# Patient Record
Sex: Female | Born: 1945 | Race: White | Hispanic: No | Smoking: Never smoker
Health system: Southern US, Community
[De-identification: ages and names within clinical notes are randomized; demographics above are authoritative.]

## PROBLEM LIST (undated history)

## (undated) DIAGNOSIS — G20 Parkinson's disease: Secondary | ICD-10-CM

## (undated) DIAGNOSIS — R251 Tremor, unspecified: Secondary | ICD-10-CM

## (undated) DIAGNOSIS — G20A1 Parkinson's disease without dyskinesia, without mention of fluctuations: Secondary | ICD-10-CM

## (undated) DIAGNOSIS — E785 Hyperlipidemia, unspecified: Secondary | ICD-10-CM

## (undated) DIAGNOSIS — M549 Dorsalgia, unspecified: Secondary | ICD-10-CM

## (undated) DIAGNOSIS — G4730 Sleep apnea, unspecified: Secondary | ICD-10-CM

## (undated) DIAGNOSIS — I499 Cardiac arrhythmia, unspecified: Secondary | ICD-10-CM

## (undated) DIAGNOSIS — G8929 Other chronic pain: Secondary | ICD-10-CM

## (undated) DIAGNOSIS — I10 Essential (primary) hypertension: Secondary | ICD-10-CM

## (undated) DIAGNOSIS — M1990 Unspecified osteoarthritis, unspecified site: Secondary | ICD-10-CM

## (undated) DIAGNOSIS — R55 Syncope and collapse: Secondary | ICD-10-CM

## (undated) DIAGNOSIS — I509 Heart failure, unspecified: Secondary | ICD-10-CM

## (undated) DIAGNOSIS — D649 Anemia, unspecified: Secondary | ICD-10-CM

## (undated) DIAGNOSIS — Z972 Presence of dental prosthetic device (complete) (partial): Secondary | ICD-10-CM

## (undated) DIAGNOSIS — M539 Dorsopathy, unspecified: Secondary | ICD-10-CM

## (undated) DIAGNOSIS — E119 Type 2 diabetes mellitus without complications: Secondary | ICD-10-CM

## (undated) DIAGNOSIS — K219 Gastro-esophageal reflux disease without esophagitis: Secondary | ICD-10-CM

## (undated) DIAGNOSIS — E039 Hypothyroidism, unspecified: Secondary | ICD-10-CM

## (undated) HISTORY — PX: COLONOSCOPY: SHX174

## (undated) HISTORY — PX: CARDIAC CATHETERIZATION: SHX172

## (undated) HISTORY — PX: OOPHORECTOMY: SHX86

## (undated) HISTORY — PX: ABDOMINAL HYSTERECTOMY: SHX81

## (undated) HISTORY — DX: Heart failure, unspecified: I50.9

---

## 2005-07-07 ENCOUNTER — Ambulatory Visit: Payer: Self-pay | Admitting: Internal Medicine

## 2005-07-21 ENCOUNTER — Ambulatory Visit: Payer: Self-pay | Admitting: Cardiology

## 2005-11-04 ENCOUNTER — Ambulatory Visit: Payer: Self-pay | Admitting: Gastroenterology

## 2006-07-26 ENCOUNTER — Ambulatory Visit: Payer: Self-pay | Admitting: Internal Medicine

## 2006-08-19 ENCOUNTER — Ambulatory Visit: Payer: Self-pay | Admitting: Internal Medicine

## 2006-09-08 ENCOUNTER — Ambulatory Visit: Payer: Self-pay | Admitting: Obstetrics and Gynecology

## 2006-09-30 ENCOUNTER — Other Ambulatory Visit: Payer: Self-pay

## 2006-11-07 ENCOUNTER — Ambulatory Visit: Payer: Self-pay | Admitting: Obstetrics and Gynecology

## 2006-12-19 ENCOUNTER — Ambulatory Visit: Payer: Self-pay | Admitting: Urology

## 2007-09-12 ENCOUNTER — Ambulatory Visit: Payer: Self-pay | Admitting: Internal Medicine

## 2008-10-01 ENCOUNTER — Ambulatory Visit: Payer: Self-pay | Admitting: Internal Medicine

## 2008-11-26 ENCOUNTER — Ambulatory Visit: Payer: Self-pay | Admitting: Gastroenterology

## 2008-12-31 ENCOUNTER — Ambulatory Visit: Payer: Self-pay | Admitting: Gastroenterology

## 2009-10-02 ENCOUNTER — Ambulatory Visit: Payer: Self-pay | Admitting: Internal Medicine

## 2010-10-20 ENCOUNTER — Ambulatory Visit: Payer: Self-pay | Admitting: Internal Medicine

## 2011-10-01 ENCOUNTER — Ambulatory Visit: Payer: Self-pay | Admitting: Internal Medicine

## 2011-10-27 ENCOUNTER — Ambulatory Visit: Payer: Self-pay | Admitting: Internal Medicine

## 2012-02-27 ENCOUNTER — Ambulatory Visit: Payer: Self-pay | Admitting: Neurology

## 2012-02-27 LAB — CREATININE, SERUM: EGFR (African American): >60

## 2012-10-27 ENCOUNTER — Ambulatory Visit: Payer: Self-pay | Admitting: Internal Medicine

## 2013-10-29 ENCOUNTER — Ambulatory Visit: Payer: Self-pay | Admitting: Internal Medicine

## 2014-03-03 ENCOUNTER — Emergency Department: Payer: Self-pay | Admitting: Emergency Medicine

## 2014-08-11 ENCOUNTER — Emergency Department: Payer: Self-pay | Admitting: Emergency Medicine

## 2014-11-18 ENCOUNTER — Ambulatory Visit: Payer: Self-pay | Admitting: Internal Medicine

## 2015-01-18 ENCOUNTER — Ambulatory Visit: Payer: Self-pay | Admitting: Orthopedic Surgery

## 2015-02-07 ENCOUNTER — Emergency Department
Admit: 2015-02-07 | Disposition: A | Discharge status: Home / Self Care | Payer: Self-pay | Admitting: Emergency Medicine

## 2015-02-21 ENCOUNTER — Other Ambulatory Visit: Payer: Self-pay | Admitting: Orthopedic Surgery

## 2015-02-21 DIAGNOSIS — M545 Low back pain: Secondary | ICD-10-CM

## 2015-03-08 ENCOUNTER — Ambulatory Visit
Admission: RE | Admit: 2015-03-08 | Discharge: 2015-03-08 | Disposition: A | Discharge status: Home / Self Care | Payer: Medicare Other | Source: Ambulatory Visit | Attending: Orthopedic Surgery | Admitting: Orthopedic Surgery

## 2015-03-08 DIAGNOSIS — M4806 Spinal stenosis, lumbar region: Secondary | ICD-10-CM | POA: Insufficient documentation

## 2015-03-08 DIAGNOSIS — M545 Low back pain: Secondary | ICD-10-CM

## 2015-03-28 ENCOUNTER — Emergency Department
Admission: EM | Admit: 2015-03-28 | Discharge: 2015-03-28 | Disposition: A | Discharge status: Home / Self Care | Payer: Medicare Other | Attending: Student | Admitting: Student

## 2015-03-28 DIAGNOSIS — E119 Type 2 diabetes mellitus without complications: Secondary | ICD-10-CM | POA: Diagnosis not present

## 2015-03-28 DIAGNOSIS — I10 Essential (primary) hypertension: Secondary | ICD-10-CM | POA: Diagnosis not present

## 2015-03-28 DIAGNOSIS — M4806 Spinal stenosis, lumbar region: Secondary | ICD-10-CM | POA: Diagnosis not present

## 2015-03-28 DIAGNOSIS — G8929 Other chronic pain: Secondary | ICD-10-CM | POA: Insufficient documentation

## 2015-03-28 DIAGNOSIS — M48061 Spinal stenosis, lumbar region without neurogenic claudication: Secondary | ICD-10-CM

## 2015-03-28 DIAGNOSIS — M549 Dorsalgia, unspecified: Secondary | ICD-10-CM | POA: Diagnosis present

## 2015-03-28 HISTORY — DX: Type 2 diabetes mellitus without complications: E11.9

## 2015-03-28 HISTORY — DX: Other chronic pain: G89.29

## 2015-03-28 HISTORY — DX: Hyperlipidemia, unspecified: E78.5

## 2015-03-28 HISTORY — DX: Dorsalgia, unspecified: M54.9

## 2015-03-28 HISTORY — DX: Essential (primary) hypertension: I10

## 2015-03-28 HISTORY — DX: Hypothyroidism, unspecified: E03.9

## 2015-03-28 MED ORDER — OXYCODONE-ACETAMINOPHEN 7.5-325 MG PO TABS: 1.0000 | ORAL_TABLET | ORAL | Status: DC | PRN

## 2015-03-28 NOTE — ED Provider Notes (Signed)
[facility] Emergency Department Provider Note  ____________________________________________  Time seen: Approximately 1:05 PM  I have reviewed the triage vital signs and the nursing notes.   HISTORY  Chief Complaint Back Pain    HPI Stephanie [name] is a 69 y.o. female patient finished a course of prednisone and tramadol 2 days ago for back pain state there has been no relief. Patient has been referred to the pain clinic for evaluation epidural injections. Patient had to change her appointment because she was on antibodies and a total of the reschedule when she finished. Patient states the pain is increasing and she has no medication to control at this time. Patient MRI was done on 03/18/2015 revealed a multiple levels of disc protrusion and spinal stenosis. Patient denies any bladder or bowel dysfunction. Patient states pain increase ambulation and flexion of the lumbar spine.   Past Medical History  Diagnosis Date  . Chronic back pain   . Hypertension   . Diabetes mellitus without complication   . Hypothyroid   . Hyperlipidemia     There are no active problems to display for this patient.   Past Surgical History  Procedure Laterality Date  . Abdominal hysterectomy      No current outpatient prescriptions on file.  Allergies Review of patient's allergies indicates no known allergies.  No family history on file.  Social History History  Substance Use Topics  . Smoking status: Never Smoker   . Smokeless tobacco: Not on file  . Alcohol Use: No    Review of Systems Constitutional: No fever/chills.  Eyes: No visual changes. ENT: No sore throat. Cardiovascular: Denies chest pain. Respiratory: Denies shortness of breath. Gastrointestinal: No abdominal pain.  No nausea, no vomiting.  No diarrhea.  No constipation. Genitourinary: Negative for dysuria. Negative for bowel dysfunction Musculoskeletal: Positive for back pain. Skin: Negative  for rash. Neurological: Negative for headaches, focal weakness or numbness.  Allergic/Immunilogical: **} 10-point ROS otherwise negative.  ____________________________________________   PHYSICAL EXAM:  VITAL SIGNS: ED Triage Vitals  Enc Vitals Group     BP 03/28/15 1202 124/99 mmHg     Pulse Rate 03/28/15 1202 85     Resp 03/28/15 1202 18     Temp 03/28/15 1202 98.2 F (36.8 C)     Temp Source 03/28/15 1202 Oral     SpO2 03/28/15 1202 100 %     Weight 03/28/15 1202 212 lb (96.163 kg)     Height 03/28/15 1202 5\' 1"  (1.549 m)     Head Cir --      Peak Flow --      Pain Score 03/28/15 1204 10     Pain Loc --      Pain Edu? --      Excl. in [place]? --     Constitutional: Alert and oriented. Appears in moderate distress. Eyes: Conjunctivae are normal. PERRL. EOMI. Head: Atraumatic. Nose: No congestion/rhinnorhea. Mouth/Throat: Mucous membranes are moist.  Oropharynx non-erythematous. Neck: No stridor.  No spinal deformity mild guarding with palpation of C4-C5. Hematological/Lymphatic/Immunilogical: No cervical lymphadenopathy. Cardiovascular: Normal rate, regular rhythm. Grossly normal heart sounds.  Good peripheral circulation. Respiratory: Normal respiratory effort.  No retractions. Lungs CTAB. Gastrointestinal: Soft and nontender. No distention. No abdominal bruits. No CVA tenderness. Musculoskeletal: No obvious spinal deformity. Patient is tender to palpation L3-L5. Patient sits to stand up heavy reliance on upper extremities. Decreased range of motion's in all fields. Neurologic:  Normal speech and language. No gross focal neurologic  deficits are appreciated. Speech is normal. Ambulates with a hesitant step. Skin:  Skin is warm, dry and intact. No rash noted. Psychiatric: Mood and affect are normal. Speech and behavior are normal.  ____________________________________________   LABS (all labs ordered are listed, but only abnormal results are displayed)  Labs Reviewed -  No data to display ____________________________________________  EKG   ____________________________________________  RADIOLOGY Reviewed MRI results consistent with patient complaint. ____________________________________________   PROCEDURES  Procedure(s) performed: None  Critical Care performed: No  ____________________________________________   INITIAL IMPRESSION / ASSESSMENT AND PLAN / ED COURSE  Pertinent labs & imaging results that were available during my care of the patient were reviewed by me and considered in my medical decision making (see chart for details).  Chronic back pain. ____________________________________________   FINAL CLINICAL IMPRESSION(S) / ED DIAGNOSES  Final diagnoses:  Spinal stenosis of lumbar region      Sable Feil, PA-C 03/28/15 [place] Gayle, MD 03/28/15 [id number]

## 2015-03-28 NOTE — Discharge Instructions (Signed)
Take medication as directed and re-schedule your epidural injection

## 2015-03-28 NOTE — ED Notes (Signed)
Pt reports hx of 2 herniated discs; orthopedist put her on predisone and tramadol without any relief. Pt here for pain medication.

## 2015-03-28 NOTE — ED Notes (Signed)
Pt c/o lower back pain who she sees Dr. Mack Guise for and is referred to the pain clinic for tx.

## 2015-07-03 HISTORY — PX: BACK SURGERY: SHX140

## 2015-08-25 ENCOUNTER — Ambulatory Visit: Payer: Medicare Other | Admitting: Pain Medicine

## 2015-11-13 ENCOUNTER — Encounter: Payer: Self-pay | Admitting: *Deleted

## 2015-11-24 ENCOUNTER — Encounter: Payer: Self-pay | Admitting: *Deleted

## 2015-11-24 ENCOUNTER — Ambulatory Visit: Payer: Medicare Other | Admitting: Certified Registered"

## 2015-11-24 ENCOUNTER — Ambulatory Visit
Admission: RE | Admit: 2015-11-24 | Discharge: 2015-11-24 | Disposition: A | Discharge status: Home / Self Care | Payer: Medicare Other | Source: Ambulatory Visit | Attending: Ophthalmology | Admitting: Ophthalmology

## 2015-11-24 ENCOUNTER — Encounter
Admission: RE | Disposition: A | Discharge status: Home / Self Care | Payer: Self-pay | Source: Ambulatory Visit | Attending: Ophthalmology

## 2015-11-24 DIAGNOSIS — G4730 Sleep apnea, unspecified: Secondary | ICD-10-CM | POA: Insufficient documentation

## 2015-11-24 DIAGNOSIS — I10 Essential (primary) hypertension: Secondary | ICD-10-CM | POA: Insufficient documentation

## 2015-11-24 DIAGNOSIS — E7800 Pure hypercholesterolemia, unspecified: Secondary | ICD-10-CM | POA: Insufficient documentation

## 2015-11-24 DIAGNOSIS — I499 Cardiac arrhythmia, unspecified: Secondary | ICD-10-CM | POA: Insufficient documentation

## 2015-11-24 DIAGNOSIS — H2511 Age-related nuclear cataract, right eye: Secondary | ICD-10-CM | POA: Insufficient documentation

## 2015-11-24 DIAGNOSIS — R002 Palpitations: Secondary | ICD-10-CM | POA: Diagnosis not present

## 2015-11-24 DIAGNOSIS — K219 Gastro-esophageal reflux disease without esophagitis: Secondary | ICD-10-CM | POA: Insufficient documentation

## 2015-11-24 DIAGNOSIS — R55 Syncope and collapse: Secondary | ICD-10-CM | POA: Diagnosis not present

## 2015-11-24 DIAGNOSIS — R251 Tremor, unspecified: Secondary | ICD-10-CM | POA: Insufficient documentation

## 2015-11-24 DIAGNOSIS — E119 Type 2 diabetes mellitus without complications: Secondary | ICD-10-CM | POA: Diagnosis not present

## 2015-11-24 DIAGNOSIS — M1990 Unspecified osteoarthritis, unspecified site: Secondary | ICD-10-CM | POA: Diagnosis not present

## 2015-11-24 DIAGNOSIS — Z90710 Acquired absence of both cervix and uterus: Secondary | ICD-10-CM | POA: Diagnosis not present

## 2015-11-24 DIAGNOSIS — M7989 Other specified soft tissue disorders: Secondary | ICD-10-CM | POA: Insufficient documentation

## 2015-11-24 HISTORY — DX: Syncope and collapse: R55

## 2015-11-24 HISTORY — DX: Cardiac arrhythmia, unspecified: I49.9

## 2015-11-24 HISTORY — DX: Tremor, unspecified: R25.1

## 2015-11-24 HISTORY — DX: Unspecified osteoarthritis, unspecified site: M19.90

## 2015-11-24 HISTORY — DX: Anemia, unspecified: D64.9

## 2015-11-24 HISTORY — PX: CATARACT EXTRACTION W/PHACO: SHX586

## 2015-11-24 HISTORY — DX: Sleep apnea, unspecified: G47.30

## 2015-11-24 HISTORY — DX: Gastro-esophageal reflux disease without esophagitis: K21.9

## 2015-11-24 LAB — GLUCOSE, CAPILLARY: GLUCOSE-CAPILLARY: 159 mg/dL — AB (ref 65–99)

## 2015-11-24 SURGERY — PHACOEMULSIFICATION, CATARACT, WITH IOL INSERTION
Anesthesia: Monitor Anesthesia Care | Site: Eye | Laterality: Right | Wound class: Clean

## 2015-11-24 MED ORDER — NA CHONDROIT SULF-NA HYALURON 40-17 MG/ML IO SOLN
INTRAOCULAR | Status: DC | PRN
  Administered 2015-11-24: 1 mL via INTRAOCULAR

## 2015-11-24 MED ORDER — TETRACAINE HCL 0.5 % OP SOLN
OPHTHALMIC | Status: DC | PRN
  Administered 2015-11-24: 1 [drp] via OPHTHALMIC

## 2015-11-24 MED ORDER — CYCLOPENTOLATE HCL 2 % OP SOLN
1.0000 [drp] | OPHTHALMIC | Status: AC | PRN
  Administered 2015-11-24 (×4): 1 [drp] via OPHTHALMIC

## 2015-11-24 MED ORDER — CEFUROXIME OPHTHALMIC INJECTION 1 MG/0.1 ML
INJECTION | OPHTHALMIC | Status: DC | PRN
  Administered 2015-11-24: .1 mL via INTRACAMERAL

## 2015-11-24 MED ORDER — MIDAZOLAM HCL 2 MG/2ML IJ SOLN
INTRAMUSCULAR | Status: DC | PRN
  Administered 2015-11-24: 1 mg via INTRAVENOUS

## 2015-11-24 MED ORDER — MOXIFLOXACIN HCL 0.5 % OP SOLN
1.0000 [drp] | OPHTHALMIC | Status: AC | PRN
  Administered 2015-11-24 (×3): 1 [drp] via OPHTHALMIC

## 2015-11-24 MED ORDER — ALFENTANIL 500 MCG/ML IJ INJ
INJECTION | INTRAMUSCULAR | Status: DC | PRN
  Administered 2015-11-24: 500 ug via INTRAVENOUS

## 2015-11-24 MED ORDER — CARBACHOL 0.01 % IO SOLN
INTRAOCULAR | Status: DC | PRN
  Administered 2015-11-24: .5 mL via INTRAOCULAR

## 2015-11-24 MED ORDER — SODIUM CHLORIDE 0.9 % IV SOLN
INTRAVENOUS | Status: DC
  Administered 2015-11-24: 07:00:00 via INTRAVENOUS

## 2015-11-24 MED ORDER — EPINEPHRINE HCL 1 MG/ML IJ SOLN
INTRAOCULAR | Status: DC | PRN
  Administered 2015-11-24: 1 mL via OPHTHALMIC

## 2015-11-24 MED ORDER — MOXIFLOXACIN HCL 0.5 % OP SOLN
OPHTHALMIC | Status: DC | PRN
  Administered 2015-11-24: 1 [drp] via OPHTHALMIC

## 2015-11-24 MED ORDER — LIDOCAINE HCL (PF) 4 % IJ SOLN
INTRAOCULAR | Status: DC | PRN
  Administered 2015-11-24: 08:00:00 via OPHTHALMIC

## 2015-11-24 MED ORDER — PHENYLEPHRINE HCL 10 % OP SOLN
1.0000 [drp] | OPHTHALMIC | Status: AC | PRN
  Administered 2015-11-24 (×4): 1 [drp] via OPHTHALMIC

## 2015-11-24 MED ORDER — LIDOCAINE HCL (PF) 4 % IJ SOLN
INTRAMUSCULAR | Status: DC | PRN
  Administered 2015-11-24: 4 mL via OPHTHALMIC

## 2015-11-24 SURGICAL SUPPLY — 29 items

## 2015-11-24 NOTE — Discharge Instructions (Addendum)
See handout. ° °AMBULATORY SURGERY  °DISCHARGE INSTRUCTIONS ° ° °1) The drugs that you were given will stay in your system until tomorrow so for the next 24 hours you should not: ° °A) Drive an automobile °B) Make any legal decisions °C) Drink any alcoholic beverage ° ° °2) You may resume regular meals tomorrow.  Today it is better to start with liquids and gradually work up to solid foods. ° °You may eat anything you prefer, but it is better to start with liquids, then soup and crackers, and gradually work up to solid foods. ° ° °3) Please notify your doctor immediately if you have any unusual bleeding, trouble breathing, redness and pain at the surgery site, drainage, fever, or pain not relieved by medication. ° ° ° °4) Additional Instructions: ° ° ° ° ° ° ° °Please contact your physician with any problems or Same Day Surgery at [phone], Monday through Friday 6 am to 4 pm, or [place] [facility] number at [phone]. °

## 2015-11-24 NOTE — Anesthesia Postprocedure Evaluation (Signed)
Anesthesia Post Note  Patient: Stephanie [name]  Procedure(s) Performed: Procedure(s) (LRB): CATARACT EXTRACTION PHACO AND INTRAOCULAR LENS PLACEMENT (IOC) (Right)  Patient location during evaluation: Short Stay Anesthesia Type: MAC Level of consciousness: awake Pain management: pain level controlled Vital Signs Assessment: post-procedure vital signs reviewed and stable Respiratory status: spontaneous breathing Cardiovascular status: blood pressure returned to baseline Postop Assessment: no headache Anesthetic complications: no    Last Vitals:  Filed Vitals:   11/24/15 0612 11/24/15 0804  BP: 123/81 135/67  Pulse: 77 70  Temp: 36.5 C 36.6 C  Resp: 16 14    Last Pain:  Filed Vitals:   11/24/15 0804  PainSc: 5                  Brantley Fling

## 2015-11-24 NOTE — Interval H&P Note (Signed)
History and Physical Interval Note:  11/24/2015 7:20 AM  Stephanie [name]  has presented today for surgery, with the diagnosis of CATARACT  The various methods of treatment have been discussed with the patient and family. After consideration of risks, benefits and other options for treatment, the patient has consented to  Procedure(s): CATARACT EXTRACTION PHACO AND INTRAOCULAR LENS PLACEMENT ([place]) (Right) as a surgical intervention .  The patient's history has been reviewed, patient examined, no change in status, stable for surgery.  I have reviewed the patient's chart and labs.  Questions were answered to the patient's satisfaction.     Oseas Detty

## 2015-11-24 NOTE — Transfer of Care (Signed)
Immediate Anesthesia Transfer of Care Note  Patient: Stephanie [name]  Procedure(s) Performed: Procedure(s) with comments: CATARACT EXTRACTION PHACO AND INTRAOCULAR LENS PLACEMENT (IOC) (Right) - [place] 01:14 AP% 25.5 CDE 30.69 fluid pack lot # [id number]  Patient Location: Short Stay  Anesthesia Type:MAC  Level of Consciousness: awake  Airway & Oxygen Therapy: Patient Spontanous Breathing  Post-op Assessment: Report given to RN  Post vital signs: Reviewed  Last Vitals:  Filed Vitals:   11/24/15 0612 11/24/15 0804  BP: 123/81 135/67  Pulse: 77 70  Temp: 36.5 C 36.6 C  Resp: 16 14    Complications: No apparent anesthesia complications

## 2015-11-24 NOTE — Op Note (Signed)
Date of Surgery: 11/24/2015 Date of Dictation: 11/24/2015 8:02 AM Pre-operative Diagnosis:  Nuclear Sclerotic Cataract right Eye Post-operative Diagnosis: same Procedure performed: Extra-capsular Cataract Extraction (ECCE) with placement of a posterior chamber intraocular lens (IOL) right Eye IOL:  Implant Name Type Inv. Item Serial No. Manufacturer Lot No. LRB No. Used  LENS IOL ACRYSOF IQ 22.0 - [id number] Intraocular Lens LENS IOL ACRYSOF IQ 22.0 [id number] ALCON [id number] Right 1   Anesthesia: 2% Lidocaine and 4% Marcaine in a 50/50 mixture with 10 unites/ml of Hylenex given as a peribulbar Anesthesiologist: Anesthesiologist: Elyse Hsu, MD CRNA: Rolla Plate, CRNA Complications: none Estimated Blood Loss: less than 1 ml  Description of procedure:  The patient was given anesthesia and sedation via intravenous access. The patient was then prepped and draped in the usual fashion. A 25-gauge needle was bent for initiating the capsulorhexis. A 5-0 silk suture was placed through the conjunctiva superior and inferiorly to serve as bridle sutures. Hemostasis was obtained at the superior limbus using an eraser cautery. A partial thickness groove was made at the anterior surgical limbus with a 64 Beaver blade and this was dissected anteriorly with an [employer]. The anterior chamber was entered at 10 o'clock with a 1.0 mm paracentesis knife and through the lamellar dissection with a 2.6 mm Alcon keratome. Epi-Shugarcaine 0.5 CC [9 cc BSS Plus (Alcon), 3 cc 4% preservative-free lidocaine (Hospira) and 4 cc 1:1000 preservative-free, bisulfite-free epinephrine] was injected into the anterior chamber via the paracentesis tract. Epi-Shugarcaine 0.5 CC [9 cc BSS Plus (Alcon), 3 cc 4% preservative-free lidocaine (Hospira) and 4 cc 1:1000 preservative-free, bisulfite-free epinephrine] was injected into the anterior chamber via the paracentesis tract. DiscoVisc was injected to replace the aqueous and  a continuous tear curvilinear capsulorhexis was performed using a bent 25-gauge needle.  Balance salt on a syringe was used to perform hydro-dissection and phacoemulsification was carried out using a divide and conquer technique. Procedure(s) with comments: CATARACT EXTRACTION PHACO AND INTRAOCULAR LENS PLACEMENT (IOC) (Right) - [place] 01:14 AP% 25.5 CDE 30.69 fluid pack lot # [id number]. Irrigation/aspiration was used to remove the residual cortex and the capsular bag was inflated with DiscoVisc. The intraocular lens was inserted into the capsular bag using a pre-loaded UltraSert Delivery System. Irrigation/aspiration was used to remove the residual DiscoVisc. The wound was inflated with balanced salt and checked for leaks. None were found. Miostat was injected via the paracentesis track and 0.1 ml of cefuroxime containing 1 mg of drug  was injected via the paracentesis track. The wound was checked for leaks again and none were found.   The bridal sutures were removed and two drops of Vigamox were placed on the eye. An eye shield was placed to protect the eye and the patient was discharged to the recovery area in good condition.   Sharone Almond MD

## 2015-11-24 NOTE — Anesthesia Preprocedure Evaluation (Signed)
Anesthesia Evaluation  Patient identified by MRN, date of birth, ID band Patient awake    Reviewed: Allergy & Precautions, NPO status , Patient's Chart, lab work & pertinent test results  Airway Mallampati: III  TM Distance: >3 FB Neck ROM: Limited    Dental  (+) Upper Dentures, Lower Dentures   Pulmonary sleep apnea and Continuous Positive Airway Pressure Ventilation ,  Not using CPAP.   Pulmonary exam normal        Cardiovascular Exercise Tolerance: Poor hypertension, Pt. on medications Normal cardiovascular exam     Neuro/Psych    GI/Hepatic GERD  Medicated and Controlled,  Endo/Other  diabetes, Type 2BG 159.  Renal/GU      Musculoskeletal   Abdominal (+) + obese,   Peds  Hematology   Anesthesia Other Findings   Reproductive/Obstetrics                             Anesthesia Physical Anesthesia Plan  ASA: III  Anesthesia Plan: MAC   Post-op Pain Management:    Induction: Intravenous  Airway Management Planned: Nasal Cannula  Additional Equipment:   Intra-op Plan:   Post-operative Plan:   Informed Consent: I have reviewed the patients History and Physical, chart, labs and discussed the procedure including the risks, benefits and alternatives for the proposed anesthesia with the patient or authorized representative who has indicated his/her understanding and acceptance.     Plan Discussed with: CRNA  Anesthesia Plan Comments:         Anesthesia Quick Evaluation

## 2015-11-24 NOTE — H&P (Signed)
See scanned note.

## 2015-12-15 ENCOUNTER — Other Ambulatory Visit: Payer: Self-pay | Admitting: Orthopedic Surgery

## 2015-12-15 DIAGNOSIS — M5412 Radiculopathy, cervical region: Secondary | ICD-10-CM

## 2015-12-31 ENCOUNTER — Emergency Department: Payer: Medicare Other

## 2015-12-31 ENCOUNTER — Encounter: Payer: Self-pay | Admitting: Emergency Medicine

## 2015-12-31 ENCOUNTER — Inpatient Hospital Stay
Admission: EM | Admit: 2015-12-31 | Discharge: 2016-01-02 | DRG: 641 | Disposition: A | Discharge status: Home / Self Care | Payer: Medicare Other | Attending: Internal Medicine | Admitting: Internal Medicine

## 2015-12-31 DIAGNOSIS — Z7984 Long term (current) use of oral hypoglycemic drugs: Secondary | ICD-10-CM

## 2015-12-31 DIAGNOSIS — E876 Hypokalemia: Secondary | ICD-10-CM | POA: Diagnosis present

## 2015-12-31 DIAGNOSIS — M1990 Unspecified osteoarthritis, unspecified site: Secondary | ICD-10-CM | POA: Diagnosis present

## 2015-12-31 DIAGNOSIS — E119 Type 2 diabetes mellitus without complications: Secondary | ICD-10-CM | POA: Diagnosis present

## 2015-12-31 DIAGNOSIS — K219 Gastro-esophageal reflux disease without esophagitis: Secondary | ICD-10-CM | POA: Diagnosis present

## 2015-12-31 DIAGNOSIS — E039 Hypothyroidism, unspecified: Secondary | ICD-10-CM | POA: Diagnosis present

## 2015-12-31 DIAGNOSIS — T500X5A Adverse effect of mineralocorticoids and their antagonists, initial encounter: Secondary | ICD-10-CM | POA: Diagnosis present

## 2015-12-31 DIAGNOSIS — R0602 Shortness of breath: Secondary | ICD-10-CM | POA: Diagnosis present

## 2015-12-31 DIAGNOSIS — E875 Hyperkalemia: Secondary | ICD-10-CM | POA: Diagnosis present

## 2015-12-31 DIAGNOSIS — M545 Low back pain: Secondary | ICD-10-CM | POA: Diagnosis present

## 2015-12-31 DIAGNOSIS — I10 Essential (primary) hypertension: Secondary | ICD-10-CM | POA: Diagnosis present

## 2015-12-31 DIAGNOSIS — G20 Parkinson's disease: Secondary | ICD-10-CM | POA: Diagnosis present

## 2015-12-31 DIAGNOSIS — Z79891 Long term (current) use of opiate analgesic: Secondary | ICD-10-CM

## 2015-12-31 DIAGNOSIS — G8929 Other chronic pain: Secondary | ICD-10-CM | POA: Diagnosis present

## 2015-12-31 DIAGNOSIS — E871 Hypo-osmolality and hyponatremia: Secondary | ICD-10-CM | POA: Diagnosis not present

## 2015-12-31 DIAGNOSIS — E785 Hyperlipidemia, unspecified: Secondary | ICD-10-CM | POA: Diagnosis present

## 2015-12-31 DIAGNOSIS — Z794 Long term (current) use of insulin: Secondary | ICD-10-CM

## 2015-12-31 DIAGNOSIS — G4733 Obstructive sleep apnea (adult) (pediatric): Secondary | ICD-10-CM | POA: Diagnosis present

## 2015-12-31 DIAGNOSIS — Z79899 Other long term (current) drug therapy: Secondary | ICD-10-CM

## 2015-12-31 LAB — BASIC METABOLIC PANEL
ANION GAP: 9 (ref 5–15)
BUN: 18 mg/dL (ref 6–20)
CALCIUM: 9.2 mg/dL (ref 8.9–10.3)
CO2: 21 mmol/L — ABNORMAL LOW (ref 22–32)
Chloride: 95 mmol/L — ABNORMAL LOW (ref 101–111)
Creatinine, Ser: 0.86 mg/dL (ref 0.44–1.00)
GFR calc Af Amer: >60 mL/min (ref >60)
GLUCOSE: 125 mg/dL — AB (ref 65–99)
Potassium: 5.6 mmol/L — ABNORMAL HIGH (ref 3.5–5.1)
Sodium: 125 mmol/L — ABNORMAL LOW (ref 135–145)

## 2015-12-31 LAB — CBC
HCT: 38.9 % (ref 35.0–47.0)
HEMOGLOBIN: 13.5 g/dL (ref 12.0–16.0)
MCH: 29.7 pg (ref 26.0–34.0)
MCHC: 34.6 g/dL (ref 32.0–36.0)
MCV: 85.6 fL (ref 80.0–100.0)
PLATELETS: 262 10*3/uL (ref 150–440)
RBC: 4.54 MIL/uL (ref 3.80–5.20)
RDW: 14.7 % — AB (ref 11.5–14.5)
WBC: 7.9 10*3/uL (ref 3.6–11.0)

## 2015-12-31 LAB — GLUCOSE, CAPILLARY
GLUCOSE-CAPILLARY: 119 mg/dL — AB (ref 65–99)
Glucose-Capillary: 97 mg/dL (ref 65–99)

## 2015-12-31 LAB — TSH: TSH: 2.342 u[IU]/mL (ref 0.350–4.500)

## 2015-12-31 LAB — TROPONIN I

## 2015-12-31 LAB — BRAIN NATRIURETIC PEPTIDE: B NATRIURETIC PEPTIDE 5: 15 pg/mL (ref 0.0–100.0)

## 2015-12-31 MED ORDER — ONDANSETRON HCL 4 MG/2ML IJ SOLN: 4.0000 mg | Freq: Four times a day (QID) | INTRAMUSCULAR | Status: DC | PRN

## 2015-12-31 MED ORDER — ADULT MULTIVITAMIN W/MINERALS CH
1.0000 | ORAL_TABLET | Freq: Every day | ORAL | Status: DC
  Administered 2016-01-01 – 2016-01-02 (×2): 1 via ORAL
  Filled 2015-12-31 (×2): qty 1

## 2015-12-31 MED ORDER — SODIUM CHLORIDE 0.9 % IV SOLN
INTRAVENOUS | Status: DC
  Administered 2015-12-31 – 2016-01-01 (×2): via INTRAVENOUS

## 2015-12-31 MED ORDER — ACETAMINOPHEN 325 MG PO TABS
650.0000 mg | ORAL_TABLET | Freq: Four times a day (QID) | ORAL | Status: DC | PRN
Start: 2015-12-31 — End: 2016-01-02
  Administered 2016-01-01 – 2016-01-02 (×2): 650 mg via ORAL
  Filled 2015-12-31 (×2): qty 2

## 2015-12-31 MED ORDER — MELOXICAM 7.5 MG PO TABS
15.0000 mg | ORAL_TABLET | Freq: Every day | ORAL | Status: DC
  Administered 2016-01-01 – 2016-01-02 (×2): 15 mg via ORAL
  Filled 2015-12-31 (×2): qty 2

## 2015-12-31 MED ORDER — METFORMIN HCL 500 MG PO TABS
1000.0000 mg | ORAL_TABLET | Freq: Two times a day (BID) | ORAL | Status: DC
  Administered 2016-01-01 – 2016-01-02 (×3): 1000 mg via ORAL
  Filled 2015-12-31 (×3): qty 2

## 2015-12-31 MED ORDER — GLIMEPIRIDE 4 MG PO TABS: 4.0000 mg | ORAL_TABLET | Freq: Two times a day (BID) | ORAL | Status: DC

## 2015-12-31 MED ORDER — ENALAPRIL MALEATE 5 MG PO TABS
20.0000 mg | ORAL_TABLET | Freq: Two times a day (BID) | ORAL | Status: DC
  Administered 2015-12-31 – 2016-01-02 (×4): 20 mg via ORAL
  Filled 2015-12-31 (×4): qty 4

## 2015-12-31 MED ORDER — IOHEXOL 300 MG/ML  SOLN
75.0000 mL | Freq: Once | INTRAMUSCULAR | Status: AC | PRN
  Administered 2015-12-31: 75 mL via INTRAVENOUS

## 2015-12-31 MED ORDER — GLIMEPIRIDE 2 MG PO TABS
2.0000 mg | ORAL_TABLET | Freq: Every day | ORAL | Status: DC
  Administered 2016-01-01: 2 mg via ORAL
  Filled 2015-12-31 (×3): qty 1

## 2015-12-31 MED ORDER — ONDANSETRON HCL 4 MG PO TABS: 4.0000 mg | ORAL_TABLET | Freq: Four times a day (QID) | ORAL | Status: DC | PRN

## 2015-12-31 MED ORDER — ENOXAPARIN SODIUM 40 MG/0.4ML ~~LOC~~ SOLN
40.0000 mg | SUBCUTANEOUS | Status: DC
  Administered 2015-12-31 – 2016-01-01 (×2): 40 mg via SUBCUTANEOUS
  Filled 2015-12-31 (×2): qty 0.4

## 2015-12-31 MED ORDER — FLUTICASONE PROPIONATE 50 MCG/ACT NA SUSP
1.0000 | NASAL | Status: DC | PRN
  Administered 2016-01-01: 1 via NASAL
  Filled 2015-12-31: qty 16

## 2015-12-31 MED ORDER — GLIMEPIRIDE 2 MG PO TABS
4.0000 mg | ORAL_TABLET | Freq: Every day | ORAL | Status: DC
  Administered 2016-01-01 – 2016-01-02 (×2): 4 mg via ORAL
  Filled 2015-12-31 (×2): qty 1
  Filled 2015-12-31 (×2): qty 2

## 2015-12-31 MED ORDER — PRAVASTATIN SODIUM 20 MG PO TABS
40.0000 mg | ORAL_TABLET | Freq: Every day | ORAL | Status: DC
  Administered 2016-01-01 – 2016-01-02 (×2): 40 mg via ORAL
  Filled 2015-12-31 (×2): qty 2

## 2015-12-31 MED ORDER — CYCLOBENZAPRINE HCL 10 MG PO TABS
5.0000 mg | ORAL_TABLET | Freq: Once | ORAL | Status: AC
  Administered 2015-12-31: 5 mg via ORAL
  Filled 2015-12-31: qty 1

## 2015-12-31 MED ORDER — SPIRONOLACTONE 25 MG PO TABS: 50.0000 mg | ORAL_TABLET | Freq: Two times a day (BID) | ORAL | Status: DC

## 2015-12-31 MED ORDER — LEVOTHYROXINE SODIUM 50 MCG PO TABS
25.0000 ug | ORAL_TABLET | Freq: Every day | ORAL | Status: DC
  Administered 2016-01-01 – 2016-01-02 (×2): 25 ug via ORAL
  Filled 2015-12-31 (×2): qty 1

## 2015-12-31 MED ORDER — ACETAMINOPHEN 650 MG RE SUPP: 650.0000 mg | Freq: Four times a day (QID) | RECTAL | Status: DC | PRN

## 2015-12-31 MED ORDER — INSULIN ASPART 100 UNIT/ML ~~LOC~~ SOLN
0.0000 [IU] | Freq: Three times a day (TID) | SUBCUTANEOUS | Status: DC
  Administered 2016-01-01: 3 [IU] via SUBCUTANEOUS
  Administered 2016-01-01 – 2016-01-02 (×2): 1 [IU] via SUBCUTANEOUS
  Filled 2015-12-31 (×2): qty 1
  Filled 2015-12-31: qty 3

## 2015-12-31 MED ORDER — SODIUM POLYSTYRENE SULFONATE 15 GM/60ML PO SUSP
30.0000 g | Freq: Once | ORAL | Status: AC
  Administered 2015-12-31: 30 g via ORAL
  Filled 2015-12-31: qty 120

## 2015-12-31 NOTE — ED Notes (Signed)
Says she has incresing sob for 2 weeks.  Says she is unable to sleep and has to sit up all night.

## 2015-12-31 NOTE — ED Provider Notes (Signed)
[facility] Emergency Department Provider Note  ____________________________________________  Time seen: Approximately 11:28 AM  I have reviewed the triage vital signs and the nursing notes.   HISTORY  Chief Complaint Fatigue and Shortness of Breath    HPI Stephanie [name] is a 70 y.o. female who has a history of diabetes and hypertension. She reports she is scheduled for an MRI of her head and possibly neck tomorrow because of the ongoing evaluation is getting for Parkinson's disease patient reports she's been having progressive shortness of breath for 2 weeks. It gets worse when she walks and gets worse when she lays down she says she feels like her airway and she can't breathe if she lays down the kink is happening in her neck. Patient has a history of low blood sodium as well. She eats all but her sodium stays down. She does not have a fever she is not coughing she's not having any increased size of her belly belly pain edema in her legs or any other problems. Patient reports she cannot lay down for very long without more than 10 or 15 minutes before she gets short of breath and has to sit up again. Patient complains of some weakness and also a little bit of nausea.   Past Medical History  Diagnosis Date  . Chronic back pain   . Hypertension   . Diabetes mellitus without complication ([place])   . Hypothyroid   . Hyperlipidemia   . Sleep apnea   . Dysrhythmia   . Syncope   . Tremors of nervous system   . GERD (gastroesophageal reflux disease)   . Anemia   . Arthritis     Patient Active Problem List   Diagnosis Date Noted  . Hyponatremia 12/31/2015    Past Surgical History  Procedure Laterality Date  . Abdominal hysterectomy    . Cardiac catheterization    . Oophorectomy    . Colonoscopy    . Back surgery  09/16  . Cataract extraction w/phaco Right 11/24/2015    Procedure: CATARACT EXTRACTION PHACO AND INTRAOCULAR LENS PLACEMENT (IOC);  Surgeon:  Estill Cotta, MD;  Location: [facility] ORS;  Service: Ophthalmology;  Laterality: Right;  [place] 01:14 AP% 25.5 CDE 30.69 fluid pack lot # [id number]    Current Outpatient Rx  Name  Route  Sig  Dispense  Refill  . enalapril (VASOTEC) 20 MG tablet   Oral   Take 20 mg by mouth 2 (two) times daily.          . fluticasone (FLONASE) 50 MCG/ACT nasal spray   Nasal   Place 1-2 sprays into the nose as needed.         Marland Kitchen glimepiride (AMARYL) 4 MG tablet   Oral   Take 4 mg by mouth 2 (two) times daily. Pt. Takes 1 tablet in the morning and .5 tablet at night.         . levothyroxine (SYNTHROID, LEVOTHROID) 25 MCG tablet   Oral   Take 25 mcg by mouth daily before breakfast.         . meloxicam (MOBIC) 15 MG tablet   Oral   Take 15 mg by mouth daily.         . metFORMIN (GLUCOPHAGE) 1000 MG tablet   Oral   Take 1,000 mg by mouth 2 (two) times daily with a meal.          . Multiple Vitamin (MULTIVITAMIN WITH MINERALS) TABS tablet   Oral  Take 1 tablet by mouth daily.         . pravastatin (PRAVACHOL) 40 MG tablet   Oral   Take 40 mg by mouth daily.         Marland Kitchen spironolactone (ALDACTONE) 50 MG tablet   Oral   Take 50 mg by mouth 2 (two) times daily.         . cyclobenzaprine (FLEXERIL) 10 MG tablet   Oral   Take 10 mg by mouth 2 (two) times daily.         Marland Kitchen oxyCODONE (OXY IR/ROXICODONE) 5 MG immediate release tablet   Oral   Take 5 mg by mouth 2 (two) times daily.           Allergies Review of patient's allergies indicates no known allergies.  No family history on file.  Social History Social History  Substance Use Topics  . Smoking status: Never Smoker   . Smokeless tobacco: None  . Alcohol Use: Yes    Review of Systems Constitutional: No fever/chills Eyes: No visual changes. ENT: No sore throat. Cardiovascular: Denies chest pain. Respiratory: See history of present illness Gastrointestinal: No abdominal pain.  No nausea, no vomiting.  No  diarrhea.  No constipation. Genitourinary: Negative for dysuria. Musculoskeletal: Negative for back pain. Skin: Negative for rash. Neurological: Negative for headaches, focal weakness or numbness.  10-point ROS otherwise negative.  ____________________________________________   PHYSICAL EXAM:  VITAL SIGNS: ED Triage Vitals  Enc Vitals Group     BP 12/31/15 0915 128/86 mmHg     Pulse Rate 12/31/15 0915 84     Resp --      Temp 12/31/15 0915 97.7 F (36.5 C)     Temp Source 12/31/15 0915 Oral     SpO2 12/31/15 0915 96 %     Weight 12/31/15 0915 204 lb (92.534 kg)     Height 12/31/15 0915 5\' 1"  (1.549 m)     Head Cir --      Peak Flow --      Pain Score 12/31/15 1128 7     Pain Loc --      Pain Edu? --      Excl. in [place]? --    Constitutional: Alert and oriented. Well appearing and in no acute distress. Eyes: Conjunctivae are normal. PERRL. EOMI. Head: Atraumatic. Nose: No congestion/rhinnorhea. Mouth/Throat: Mucous membranes are moist.  Oropharynx non-erythematous. Neck: No stridor. No masses  Cardiovascular: Normal rate, regular rhythm. Grossly normal heart sounds.  Good peripheral circulation. Respiratory: Normal respiratory effort.  No retractions. Lungs CTAB. Gastrointestinal: Soft and nontender. No distention. No abdominal bruits. No CVA tenderness. Musculoskeletal: No lower extremity tenderness nor edema.  No joint effusions. Neurologic:  Normal speech and language. No gross focal neurologic deficits are appreciated. No gait instability. Skin:  Skin is warm, dry and intact. No rash noted. Psychiatric: Mood and affect are normal. Speech and behavior are normal.  ____________________________________________   LABS (all labs ordered are listed, but only abnormal results are displayed)  Labs Reviewed  BASIC METABOLIC PANEL - Abnormal; Notable for the following:    Sodium 125 (*)    Potassium 5.6 (*)    Chloride 95 (*)    CO2 21 (*)    Glucose, Bld 125 (*)     All other components within normal limits  CBC - Abnormal; Notable for the following:    RDW 14.7 (*)    All other components within normal limits  TROPONIN I  BRAIN NATRIURETIC  PEPTIDE   ____________________________________________  EKG  KG read and interpreted by me shows normal sinus rhythm at a rate of 84 left axis no acute changes there are some irregularities in one or 2 of the leads especially aVL which make me wonder about the possibility of a flutter but I cannot see this in V1 which is the usually that rhythm is ____________________________________________  RADIOLOGY  Chest x-ray shows no acute disease per radiology CT of the neck shows no acute pathology there is some chronic gas in his esophagus which could mean the patient's having reflux symptoms per radiology. CT of the chest is no acute pathology per radiology ____________________________________________   Hamilton Capri  ____________________________________________   INITIAL IMPRESSION / ASSESSMENT AND PLAN / ED COURSE  Pertinent labs & imaging results that were available during my care of the patient were reviewed by me and considered in my medical decision making (see chart for details).  Repeat EKG because monitor looked unusual with wide-complex tachycardia repeat EKG shows sinus tach at 108 left axis no acute changes no V. tach ____________________________________________   FINAL CLINICAL IMPRESSION(S) / ED DIAGNOSES  Final diagnoses:  Hyponatremia  Hyperkalemia      Nena Polio, MD 12/31/15 1553

## 2015-12-31 NOTE — ED Notes (Signed)
Patient transported to CT 

## 2015-12-31 NOTE — H&P (Addendum)
[place] [facility] [place] NAME: Stephanie [name]    MR#:  [id number]  DATE OF BIRTH:  Nov 28, 1945  DATE OF ADMISSION:  12/31/2015  PRIMARY CARE PHYSICIAN: SPARKS,JEFFREY D, MD   REQUESTING/REFERRING PHYSICIAN: Dr Cinda Quest  CHIEF COMPLAINT:  Shortness of breath, not feeling well, the doctor said my sodium is low  HISTORY OF PRESENT ILLNESS:  Stephanie [name]  is a 70 y.o. female with a known history of tremors, hypertension, diabetes, hypothyroidism, hyperlipidemia and history of hyponatremia in the past comes to the emergency room with complaints of shortness of breath, waking up several times in the middle of the night and who has history of sleep apnea missed her sleep study appointment yesterday. Patient was evaluated for her shortness of breath in the form of CT of the chest and chest x-ray which was essentially negative for any PE or CHF. She was also found to have sodium of 125 and hospitalists were called for admission. Patient has had history OF hyponatremia in the past according to the labs from Dr. Doy Hutching. Patient states she was taken off her Parkinson's medicine because it was giving her low sodium not sure of what medicine she was on.  PAST MEDICAL HISTORY:   Past Medical History  Diagnosis Date  . Chronic back pain   . Hypertension   . Diabetes mellitus without complication ([place])   . Hypothyroid   . Hyperlipidemia   . Sleep apnea   . Dysrhythmia   . Syncope   . Tremors of nervous system   . GERD (gastroesophageal reflux disease)   . Anemia   . Arthritis     PAST SURGICAL HISTOIRY:   Past Surgical History  Procedure Laterality Date  . Abdominal hysterectomy    . Cardiac catheterization    . Oophorectomy    . Colonoscopy    . Back surgery  09/16  . Cataract extraction w/phaco Right 11/24/2015    Procedure: CATARACT EXTRACTION PHACO AND INTRAOCULAR LENS PLACEMENT (IOC);  Surgeon: Estill Cotta, MD;  Location:  [facility] ORS;  Service: Ophthalmology;  Laterality: Right;  [place] 01:14 AP% 25.5 CDE 30.69 fluid pack lot # [id number]    SOCIAL HISTORY:   Social History  Substance Use Topics  . Smoking status: Never Smoker   . Smokeless tobacco: Not on file  . Alcohol Use: Yes    FAMILY HISTORY:  No family history on file.  DRUG ALLERGIES:  No Known Allergies  REVIEW OF SYSTEMS:  Review of Systems  Constitutional: Negative for fever, chills and weight loss.  HENT: Negative for ear discharge, ear pain and nosebleeds.   Eyes: Negative for blurred vision, pain and discharge.  Respiratory: Negative for sputum production, shortness of breath, wheezing and stridor.   Cardiovascular: Negative for chest pain, palpitations, orthopnea and PND.  Gastrointestinal: Negative for nausea, vomiting, abdominal pain and diarrhea.  Genitourinary: Negative for urgency and frequency.  Musculoskeletal: Negative for back pain and joint pain.  Neurological: Positive for weakness. Negative for sensory change, speech change and focal weakness.  Psychiatric/Behavioral: Negative for depression and hallucinations. The patient is not nervous/anxious.   All other systems reviewed and are negative.    MEDICATIONS AT HOME:   Prior to Admission medications   Medication Sig Start Date End Date Taking? Authorizing Provider  enalapril (VASOTEC) 20 MG tablet Take 20 mg by mouth 2 (two) times daily.    Yes Historical Provider, MD  fluticasone (FLONASE) 50 MCG/ACT nasal spray Place 1-2 sprays  into the nose as needed. 09/29/15  Yes Historical Provider, MD  glimepiride (AMARYL) 4 MG tablet Take 4 mg by mouth 2 (two) times daily. Pt. Takes 1 tablet in the morning and .5 tablet at night.   Yes Historical Provider, MD  levothyroxine (SYNTHROID, LEVOTHROID) 25 MCG tablet Take 25 mcg by mouth daily before breakfast.   Yes Historical Provider, MD  meloxicam (MOBIC) 15 MG tablet Take 15 mg by mouth daily.   Yes Historical Provider, MD   metFORMIN (GLUCOPHAGE) 1000 MG tablet Take 1,000 mg by mouth 2 (two) times daily with a meal.    Yes Historical Provider, MD  Multiple Vitamin (MULTIVITAMIN WITH MINERALS) TABS tablet Take 1 tablet by mouth daily.   Yes Historical Provider, MD  pravastatin (PRAVACHOL) 40 MG tablet Take 40 mg by mouth daily.   Yes Historical Provider, MD  spironolactone (ALDACTONE) 50 MG tablet Take 50 mg by mouth 2 (two) times daily.   Yes Historical Provider, MD  cyclobenzaprine (FLEXERIL) 10 MG tablet Take 10 mg by mouth 2 (two) times daily.    Historical Provider, MD  oxyCODONE (OXY IR/ROXICODONE) 5 MG immediate release tablet Take 5 mg by mouth 2 (two) times daily.    Historical Provider, MD      VITAL SIGNS:  Blood pressure 125/83, pulse 117, temperature 97.7 F (36.5 C), temperature source Oral, resp. rate 15, height 5\' 1"  (1.549 m), weight 92.534 kg (204 lb), SpO2 96 %.  PHYSICAL EXAMINATION:  GENERAL:  70 y.o.-year-old patient lying in the bed with no acute distress.  EYES: Pupils equal, round, reactive to light and accommodation. No scleral icterus. Extraocular muscles intact.  HEENT: Head atraumatic, normocephalic. Oropharynx and nasopharynx clear.  NECK:  Supple, no jugular venous distention. No thyroid enlargement, no tenderness.  LUNGS: Normal breath sounds bilaterally, no wheezing, rales,rhonchi or crepitation. No use of accessory muscles of respiration.  CARDIOVASCULAR: S1, S2 normal. No murmurs, rubs, or gallops.  ABDOMEN: Soft, nontender, nondistended. Bowel sounds present. No organomegaly or mass.  EXTREMITIES: No pedal edema, cyanosis, or clubbing.  NEUROLOGIC: Cranial nerves II through XII are intact. Muscle strength 5/5 in all extremities. Sensation intact. Gait not checked.  PSYCHIATRIC: The patient is alert and oriented x 3.  SKIN: No obvious rash, lesion, or ulcer.   LABORATORY PANEL:   CBC  Recent Labs Lab 12/31/15 0928  WBC 7.9  HGB 13.5  HCT 38.9  PLT 262    ------------------------------------------------------------------------------------------------------------------  Chemistries   Recent Labs Lab 12/31/15 0928  NA 125*  K 5.6*  CL 95*  CO2 21*  GLUCOSE 125*  BUN 18  CREATININE 0.86  CALCIUM 9.2   ------------------------------------------------------------------------------------------------------------------  Cardiac Enzymes  Recent Labs Lab 12/31/15 0928  TROPONINI <0.03   ------------------------------------------------------------------------------------------------------------------  RADIOLOGY:  Dg Chest 2 View  12/31/2015  CLINICAL DATA:  Increasing shortness of breath for 2 weeks. Weakness. EXAM: CHEST  2 VIEW COMPARISON:  None. FINDINGS: The heart size and mediastinal contours are within normal limits. Minimal scarring or atelectasis in the lingula. Lungs are otherwise clear. No effusions. No osseous abnormality. IMPRESSION: No significant abnormality. Electronically Signed   By: Lorriane Shire M.D.   On: 12/31/2015 09:46   Ct Soft Tissue Neck W Contrast  12/31/2015  CLINICAL DATA:  Shortness of breath when lying flat were looking down. EXAM: CT NECK WITH CONTRAST TECHNIQUE: Multidetector CT imaging of the neck was performed using the standard protocol following the bolus administration of intravenous contrast. CONTRAST:  72mL OMNIPAQUE IOHEXOL 300 MG/ML  SOLN COMPARISON:  MRI of the cervical spine 01/18/2015. FINDINGS: Pharynx and larynx: No focal mucosal or submucosal lesions are present. The tongue base is within normal limits. Vocal cords are midline and symmetric. The airway is patent throughout the neck. Note is made that the esophagus is gas-filled in the upper mediastinum. This may reflect esophageal motility or reflux disease. Salivary glands: Within normal limits bilaterally. Thyroid: Negative. Lymph nodes: No significant cervical adenopathy is present. A left subclavian node measures 12 x 8 mm in appears flame  otorrhea. There is some patient motion in neck region. Vascular: Medial deviation of the right internal carotid artery creates slight bulging on the posterior hypopharynx without airway obstruction. Mild atherosclerotic calcifications are present without significant stenosis. Limited intracranial: Within normal limits Visualized orbits: The visualized portions are unremarkable. Mastoids and visualized paranasal sinuses: Clear Skeleton: Degenerative changes are present in the upper cervical spine. No focal lytic or blastic lesions are present. The patient is edentulous. Upper chest: Mild ground-glass attenuation is present. No focal airspace disease, nodule, or mass lesion is present. IMPRESSION: 1. The airway is patent without evidence for obstruction. 2. Chronic gas within the upper esophagus. This suggests esophageal motility disorder or possibly reflux disease. This could be giving the patient and abnormal sensation when she lies supine. 3. Minimal ground-glass attenuation at the lung apices likely reflects atelectasis as there was no significant disease present on the chest CT done on the same day. 4. Degenerative changes of the upper cervical spine. Electronically Signed   By: San Morelle M.D.   On: 12/31/2015 13:24   Ct Chest W Contrast  12/31/2015  CLINICAL DATA:  Shortness of breath when lying flat or looking down for 2 weeks, feels like something in her neck is cut upper airway, history hypertension, diabetes mellitus EXAM: CT CHEST WITH CONTRAST TECHNIQUE: Multidetector CT imaging of the chest was performed during intravenous contrast administration. Sagittal and coronal MPR images reconstructed from axial data set. CONTRAST:  52mL OMNIPAQUE IOHEXOL 300 MG/ML  SOLN IV COMPARISON:  None ; correlation chest radiograph 12/31/2015 FINDINGS: Minimal atherosclerotic calcification aorta and coronary arteries. Few scattered normal sized mediastinal nodes without thoracic adenopathy. Visualized portion  of upper abdomen normal appearance. Airways patent and unremarkable. Lungs clear. No pulmonary infiltrate, pleural effusion, pneumothorax or definite mass/nodule. Scattered degenerative disc disease changes of the thoracic spine with osseous demineralization. IMPRESSION: No significant intra thoracic abnormalities as above. Electronically Signed   By: Lavonia Dana M.D.   On: 12/31/2015 13:04    EKG:    IMPRESSION AND PLAN:   Mariangely Sawinski  is a 70 y.o. female with a known history of tremors, hypertension, diabetes, hypothyroidism, hyperlipidemia and history of hyponatremia in the past comes to the emergency room with complaints of shortness of breath, waking up several times in the middle of the night and who has history of sleep apnea missed her sleep study appointment yesterday. Patient was evaluated for her shortness of breath in the form of CT of the chest and chest x-ray which was essentially negative for any PE or CHF  1. Hyponatremia acute on chronic Start patient on IV normal saline and monitor sodium closely. Nephrology consultation for hyponatremia. Patient has had the symptoms in the past  2. Shortness of breath, waking up in the middle of the night with history of sleep apnea. Patient's sats in the emergency room were 98 200% on room air on activity and while talking. Patient was recommended to reschedule the sleep study appointment  as outpatient when she gets a chance to her PCP   3. Hypertension continue home meds  4. Hypokalemia suspected due to increased doses of spironolactone. Patient is on 50 mg twice a day not sure why. We'll hold off on her left arm. She has no EKG changes and sulfa give her some Kayexalate to bring potassium down.  5. Hyperlipidemia continue pravastatin  6. Type 2 diabetes continue Amaryl and sliding scale  7. DVT prophylaxis Lovenox  All the records are reviewed and case discussed with ED provider. Management plans discussed with the patient,  family and they are in agreement.  CODE STATUS: full  TOTAL TIME TAKING CARE OF THIS PATIENT: 50 minutes.    Dennisse Swader M.D on 12/31/2015 at 3:40 PM  Between 7am to 6pm - Pager - [phone]  After 6pm go to [email or web address] - password [email or web address] [facility]  Office  [phone]  CC: Primary care physician; [name], MD

## 2016-01-01 ENCOUNTER — Ambulatory Visit: Admission: RE | Admit: 2016-01-01 | Payer: Medicare Other | Source: Ambulatory Visit

## 2016-01-01 LAB — BASIC METABOLIC PANEL
Anion gap: 10 (ref 5–15)
BUN: 18 mg/dL (ref 6–20)
CHLORIDE: 97 mmol/L — AB (ref 101–111)
CO2: 21 mmol/L — AB (ref 22–32)
CREATININE: 0.93 mg/dL (ref 0.44–1.00)
Calcium: 8.5 mg/dL — ABNORMAL LOW (ref 8.9–10.3)
GFR calc non Af Amer: >60 mL/min (ref >60)
Glucose, Bld: 129 mg/dL — ABNORMAL HIGH (ref 65–99)
POTASSIUM: 4.3 mmol/L (ref 3.5–5.1)
Sodium: 128 mmol/L — ABNORMAL LOW (ref 135–145)

## 2016-01-01 LAB — GLUCOSE, CAPILLARY
GLUCOSE-CAPILLARY: 147 mg/dL — AB (ref 65–99)
GLUCOSE-CAPILLARY: 119 mg/dL — AB (ref 65–99)
GLUCOSE-CAPILLARY: 71 mg/dL (ref 65–99)
Glucose-Capillary: 242 mg/dL — ABNORMAL HIGH (ref 65–99)
Glucose-Capillary: 122 mg/dL — ABNORMAL HIGH (ref 65–99)
Glucose-Capillary: 129 mg/dL — ABNORMAL HIGH (ref 65–99)

## 2016-01-01 LAB — SODIUM
SODIUM: 128 mmol/L — AB (ref 135–145)
Sodium: 128 mmol/L — ABNORMAL LOW (ref 135–145)

## 2016-01-01 MED ORDER — CYCLOBENZAPRINE HCL 10 MG PO TABS
5.0000 mg | ORAL_TABLET | Freq: Once | ORAL | Status: AC
  Administered 2016-01-01: 5 mg via ORAL
  Filled 2016-01-01: qty 1

## 2016-01-01 MED ORDER — OXYCODONE HCL 5 MG PO TABS
5.0000 mg | ORAL_TABLET | Freq: Two times a day (BID) | ORAL | Status: DC | PRN
  Administered 2016-01-01 – 2016-01-02 (×3): 5 mg via ORAL
  Filled 2016-01-01 (×3): qty 1

## 2016-01-01 NOTE — Progress Notes (Addendum)
[place] at [place] NAME: Stephanie [name]    MR#:  [id number]  DATE OF BIRTH:  11-Jan-1946  SUBJECTIVE:  CHIEF COMPLAINT:  Patient is resting comfortably, shortness of breath is completely resolved as per her report but reporting back pain and asking pain medications. Reports that she has been following up with Dr. Mack Guise for several months and had back surgery on 07/03/2015, supposed to get outpatient MRI (doesn't know what MRI) today as a follow-up  REVIEW OF SYSTEMS:  CONSTITUTIONAL: No fever, fatigue or weakness.  EYES: No blurred or double vision.  EARS, NOSE, AND THROAT: No tinnitus or ear pain.  RESPIRATORY: No cough, shortness of breath, wheezing or hemoptysis.  CARDIOVASCULAR: No chest pain, orthopnea, edema.  GASTROINTESTINAL: No nausea, vomiting, diarrhea or abdominal pain.  GENITOURINARY: No dysuria, hematuria.  ENDOCRINE: No polyuria, nocturia,  HEMATOLOGY: No anemia, easy bruising or bleeding SKIN: No rash or lesion. MUSCULOSKELETAL: Reporting chronic low back pain and hip pain on the left side NEUROLOGIC: No tingling, numbness, weakness.  PSYCHIATRY: No anxiety or depression.   DRUG ALLERGIES:  No Known Allergies  VITALS:  Blood pressure 148/70, pulse 92, temperature 98 F (36.7 C), temperature source Oral, resp. rate 20, height 5\' 1"  (1.549 m), weight 91.173 kg (201 lb), SpO2 100 %.  PHYSICAL EXAMINATION:  GENERAL:  70 y.o.-year-old patient lying in the bed with no acute distress.  EYES: Pupils equal, round, reactive to light and accommodation. No scleral icterus. Extraocular muscles intact.  HEENT: Head atraumatic, normocephalic. Oropharynx and nasopharynx clear.  NECK:  Supple, no jugular venous distention. No thyroid enlargement, no tenderness.  LUNGS: Normal breath sounds bilaterally, no wheezing, rales,rhonchi or crepitation. No use of accessory muscles of respiration.  CARDIOVASCULAR: S1, S2 normal. No  murmurs, rubs, or gallops.  ABDOMEN: Soft, nontender, nondistended. Bowel sounds present. No organomegaly or mass.  EXTREMITIES: Range of motion of the left hip is limited from tenderness  Peripheral pulses are 2+ No pedal edema, cyanosis, or clubbing.  NEUROLOGIC: Cranial nerves II through XII are intact. Muscle strength 5/5 in all extremities. Sensation intact. Gait not checked.  PSYCHIATRIC: The patient is alert and oriented x 3.  SKIN: No obvious rash, lesion, or ulcer.    LABORATORY PANEL:   CBC  Recent Labs Lab 12/31/15 0928  WBC 7.9  HGB 13.5  HCT 38.9  PLT 262   ------------------------------------------------------------------------------------------------------------------  Chemistries   Recent Labs Lab 01/01/16 0515 01/01/16 1212  NA 128* 128*  K 4.3  --   CL 97*  --   CO2 21*  --   GLUCOSE 129*  --   BUN 18  --   CREATININE 0.93  --   CALCIUM 8.5*  --    ------------------------------------------------------------------------------------------------------------------  Cardiac Enzymes  Recent Labs Lab 12/31/15 0928  TROPONINI <0.03   ------------------------------------------------------------------------------------------------------------------  RADIOLOGY:  Dg Chest 2 View  12/31/2015  CLINICAL DATA:  Increasing shortness of breath for 2 weeks. Weakness. EXAM: CHEST  2 VIEW COMPARISON:  None. FINDINGS: The heart size and mediastinal contours are within normal limits. Minimal scarring or atelectasis in the lingula. Lungs are otherwise clear. No effusions. No osseous abnormality. IMPRESSION: No significant abnormality. Electronically Signed   By: Lorriane Shire M.D.   On: 12/31/2015 09:46   Ct Soft Tissue Neck W Contrast  12/31/2015  CLINICAL DATA:  Shortness of breath when lying flat were looking down. EXAM: CT NECK WITH CONTRAST TECHNIQUE: Multidetector CT imaging of the neck was  performed using the standard protocol following the bolus administration  of intravenous contrast. CONTRAST:  33mL OMNIPAQUE IOHEXOL 300 MG/ML  SOLN COMPARISON:  MRI of the cervical spine 01/18/2015. FINDINGS: Pharynx and larynx: No focal mucosal or submucosal lesions are present. The tongue base is within normal limits. Vocal cords are midline and symmetric. The airway is patent throughout the neck. Note is made that the esophagus is gas-filled in the upper mediastinum. This may reflect esophageal motility or reflux disease. Salivary glands: Within normal limits bilaterally. Thyroid: Negative. Lymph nodes: No significant cervical adenopathy is present. A left subclavian node measures 12 x 8 mm in appears flame otorrhea. There is some patient motion in neck region. Vascular: Medial deviation of the right internal carotid artery creates slight bulging on the posterior hypopharynx without airway obstruction. Mild atherosclerotic calcifications are present without significant stenosis. Limited intracranial: Within normal limits Visualized orbits: The visualized portions are unremarkable. Mastoids and visualized paranasal sinuses: Clear Skeleton: Degenerative changes are present in the upper cervical spine. No focal lytic or blastic lesions are present. The patient is edentulous. Upper chest: Mild ground-glass attenuation is present. No focal airspace disease, nodule, or mass lesion is present. IMPRESSION: 1. The airway is patent without evidence for obstruction. 2. Chronic gas within the upper esophagus. This suggests esophageal motility disorder or possibly reflux disease. This could be giving the patient and abnormal sensation when she lies supine. 3. Minimal ground-glass attenuation at the lung apices likely reflects atelectasis as there was no significant disease present on the chest CT done on the same day. 4. Degenerative changes of the upper cervical spine. Electronically Signed   By: San Morelle M.D.   On: 12/31/2015 13:24   Ct Chest W Contrast  12/31/2015  CLINICAL DATA:   Shortness of breath when lying flat or looking down for 2 weeks, feels like something in her neck is cut upper airway, history hypertension, diabetes mellitus EXAM: CT CHEST WITH CONTRAST TECHNIQUE: Multidetector CT imaging of the chest was performed during intravenous contrast administration. Sagittal and coronal MPR images reconstructed from axial data set. CONTRAST:  34mL OMNIPAQUE IOHEXOL 300 MG/ML  SOLN IV COMPARISON:  None ; correlation chest radiograph 12/31/2015 FINDINGS: Minimal atherosclerotic calcification aorta and coronary arteries. Few scattered normal sized mediastinal nodes without thoracic adenopathy. Visualized portion of upper abdomen normal appearance. Airways patent and unremarkable. Lungs clear. No pulmonary infiltrate, pleural effusion, pneumothorax or definite mass/nodule. Scattered degenerative disc disease changes of the thoracic spine with osseous demineralization. IMPRESSION: No significant intra thoracic abnormalities as above. Electronically Signed   By: Lavonia Dana M.D.   On: 12/31/2015 13:04    EKG:   Orders placed or performed during the hospital encounter of 12/31/15  . EKG 12-Lead  . EKG 12-Lead  . ED EKG within 10 minutes  . ED EKG within 10 minutes  . EKG 12-Lead  . EKG 12-Lead    ASSESSMENT AND PLAN:   Stephanie [name] is a 70 y.o. female with a known history of tremors, chronic low back pain and back surgery by Dr. Mack Guise in September -2016 hypertension, diabetes, hypothyroidism, hyperlipidemia and history of hyponatremia in the past comes to the emergency room with complaints of shortness of breath, waking up several times in the middle of the night and who has history of sleep apnea missed her sleep study appointment yesterday. Patient was evaluated for her shortness of breath in the form of CT of the chest and chest x-ray which was essentially negative for any PE  or CHF  #. Hyponatremia acute on chronic Sodium is improving with IV fluids-125-128  on  IV normal saline and monitor sodium closely. Nephrology consultation for hyponatremia. Discussed with Dr. Candiss Norse , patient is mentating fine at this time Patient has had the symptoms in the past  #. Shortness of breath, waking up in the middle of the night with history of sleep apnea. Completely resolved as per patient's report  Patient's sats in the emergency room were 98 200% on room air on activity and while talking. Patient was recommended to reschedule the sleep study appointment as outpatient when she gets a chance  #Chronic low back pain Pain management as needed Orthopedics consult is placed to Dr. Mack Guise as the patient is requesting MRI to be done in the hospital setting which is originally scheduled as outpatient MRI, she doesn't know what part of the spine need to be imaged CT of soft tissue neck with chronic changes   # Hypertension continue home meds  #. Hyperkalemia suspected due to increased doses of spironolactone. Improving, Kayexalate was given Potassium 5.6-4.3  Patient is on 50 mg twice a day not sure why. We'll hold off.  She has no EKG changes .  #. Hyperlipidemia continue pravastatin  #. Type 2 diabetes continue Amaryl and sliding scale  #. DVT prophylaxis Lovenox   Patient's daughter Ms. Butch Penny called and asked for the plan of care. He explained to her that her sodium is improving and patient is clinically doing fine with IV fluids and shortness of breath is resolved. Asked to get the MRI of the spine while patient is in the hospital, as this is a chronic problem Ive recommended to reschedule MRI to get done as an outpatient, also told her that patient will be most likely be discharged in a day or 2 as clinically improving. She asked me to call Dr. Harden Mo office and reschedule the MRI. I've told her that our secretary will try to help her to reschedule the MRI appointment. I was about to offer to consult orthopedics Dr. Mack Guise, but she became extremely  rude on me and said that I'm incompetent to take care of her mom. She also asked me to transfer the patient out of this hospital. At this point of time as I and patient's family could not get along well, I will sign off on this case. Patient was handed over to Dr. Genia Harold, who has agreed to take care of this patient  Orthopedics consult is placed and discussed with Dr. Mack Guise regarding the consult, he has recommended outpatient follow-up at this point of time.  Dr. Candiss Norse, nephrology and RN Barry Dienes were  present in the patient's room during my discussion with the patient's daughter on speaker phone  Nursing director Ms.Loney Hering called and asked me about the MRI if it can be ordered as an inpatient. Explained to her about the patient's chronic back pain  and recommended outpatient study;   All the records are reviewed and case discussed with Care Management/Social Workerr. Management plans discussed with the patient, family and they are in agreement.  CODE STATUS: fc   TOTAL TIME TAKING CARE OF THIS PATIENT: 45  minutes.   POSSIBLE D/C IN ? DAYS, DEPENDING ON CLINICAL CONDITION.   Nicholes Mango M.D on 01/01/2016 at 3:05 PM  Between 7am to 6pm - Pager - [phone] After 6pm go to [email or web address] - password [email or web address] [facility]  Office  [phone]  CC: Primary care physician; [name], MD

## 2016-01-01 NOTE — Progress Notes (Signed)
Pt alert and oreinted. Pt daughter Stephanie [name] called with concern on pt status and previously scheduled 01/01/16 outpatient MRI. Upon addressing concerns, relayed message to Dr. Mauro Kaufmann who agreed to speaking to pt daughter. Upon asking, Dr. Sandre Kitty shared that pt would likely be discharged tomorrow being that all goes as planned and labs improved.To avoid incurring in-patient cost vs out-patient MRI, Dr. Mauro Kaufmann suggested that pt MRI be re-scheduled out-patient and offered nurse station number for the outpatient MRI to discuss scheduling for possible follow-up. Pt daughter became very upset that the pt was under our care, missed her outpatient MRI appointment, and would have to re-schedule the appointment. Dr. Mauro Kaufmann also offered pt consult with orthopedic MD in addition to possibly getting put patient MRI re-scheduled. Pt daughter stated she understood the 'politics of it' yet still questioned what is being done for her now. Pt daughter became very upset on phone and questioned the competency of Dr. Mauro Kaufmann.   The pt Stephanie [name] who also heard the conversation between MD and pt Daughter, on speaker phone, apologized for her daughter's manner in discussion. Stating 'sometimes she is head strong'.   New order for ortho consult was placed by Dr. Mauro Kaufmann and prn pain medication was ordered by Dr. Lemmie Evens. Singh.  PRN pain medication was given, pt resting in bed continue to assess.

## 2016-01-01 NOTE — Consult Note (Signed)
Date: 01/01/2016                  Patient Name:  Stephanie [name]  MRN: [id number]  DOB: 10-23-1946  Age / Sex: 70 y.o., female         PCP: [name], MD                 Service Requesting Consult: Internal Medicine                 Reason for Consult: Hyponatremia            History of Present Illness: Patient is a 70 y.o. female with medical problems of chronic DM-2, OSA, Syncope, GERD, Parkinson's, chronic back pain who was admitted to [facility] on 12/31/2015 for evaluation of hyponatremia.  Patient reports she has been dealing with hyponatremia for the past 3 years Admission sodium was 125 Today's sodium level is 128 Repeat sodium is 128 also Serum creatinine is within normal range Other workup included TSH which is normal at 2.342   Medications: Outpatient medications: Prescriptions prior to admission  Medication Sig Dispense Refill Last Dose  . enalapril (VASOTEC) 20 MG tablet Take 20 mg by mouth 2 (two) times daily.    12/31/2015 at Unknown time  . fluticasone (FLONASE) 50 MCG/ACT nasal spray Place 1-2 sprays into the nose as needed.   prn  . glimepiride (AMARYL) 4 MG tablet Take 4 mg by mouth 2 (two) times daily. Pt. Takes 1 tablet in the morning and .5 tablet at night.   12/31/2015 at Unknown time  . levothyroxine (SYNTHROID, LEVOTHROID) 25 MCG tablet Take 25 mcg by mouth daily before breakfast.   12/31/2015 at Unknown time  . meloxicam (MOBIC) 15 MG tablet Take 15 mg by mouth daily.   12/31/2015 at Unknown time  . metFORMIN (GLUCOPHAGE) 1000 MG tablet Take 1,000 mg by mouth 2 (two) times daily with a meal.    12/31/2015 at Unknown time  . Multiple Vitamin (MULTIVITAMIN WITH MINERALS) TABS tablet Take 1 tablet by mouth daily.   12/30/2015 at Unknown time  . pravastatin (PRAVACHOL) 40 MG tablet Take 40 mg by mouth daily.   12/31/2015 at Unknown time  . spironolactone (ALDACTONE) 50 MG tablet Take 50 mg by mouth 2 (two) times daily.   12/31/2015 at Unknown time  . cyclobenzaprine  (FLEXERIL) 10 MG tablet Take 10 mg by mouth 2 (two) times daily.   11/23/2015 at 1900  . oxyCODONE (OXY IR/ROXICODONE) 5 MG immediate release tablet Take 5 mg by mouth 2 (two) times daily.   11/23/2015 at 2100    Current medications: Current Facility-Administered Medications  Medication Dose Route Frequency Provider Last Rate Last Dose  . 0.9 %  sodium chloride infusion   Intravenous Continuous Fritzi Mandes, MD 75 mL/hr at 01/01/16 0941    . acetaminophen (TYLENOL) tablet 650 mg  650 mg Oral Q6H PRN Fritzi Mandes, MD   650 mg at 01/01/16 0940   Or  . acetaminophen (TYLENOL) suppository 650 mg  650 mg Rectal Q6H PRN Fritzi Mandes, MD      . enalapril (VASOTEC) tablet 20 mg  20 mg Oral BID Fritzi Mandes, MD   20 mg at 01/01/16 [id number]  . enoxaparin (LOVENOX) injection 40 mg  40 mg Subcutaneous Q24H Fritzi Mandes, MD   40 mg at 12/31/15 2117  . fluticasone (FLONASE) 50 MCG/ACT nasal spray 1 spray  1 spray Each Nare PRN Fritzi Mandes, MD   1 spray  at 01/01/16 0941  . glimepiride (AMARYL) tablet 2 mg  2 mg Oral Q supper Lenis Noon, RPH   2 mg at 12/31/15 1730  . glimepiride (AMARYL) tablet 4 mg  4 mg Oral Q breakfast Lenis Noon, RPH   4 mg at 01/01/16 [id number]  . insulin aspart (novoLOG) injection 0-9 Units  0-9 Units Subcutaneous TID WC Fritzi Mandes, MD   3 Units at 01/01/16 0936  . levothyroxine (SYNTHROID, LEVOTHROID) tablet 25 mcg  25 mcg Oral QAC breakfast Fritzi Mandes, MD   25 mcg at 01/01/16 [phone]  . meloxicam (MOBIC) tablet 15 mg  15 mg Oral Daily Fritzi Mandes, MD   15 mg at 01/01/16 0940  . metFORMIN (GLUCOPHAGE) tablet 1,000 mg  1,000 mg Oral BID WC Fritzi Mandes, MD   1,000 mg at 01/01/16 [id number]  . multivitamin with minerals tablet 1 tablet  1 tablet Oral Daily Fritzi Mandes, MD   1 tablet at 01/01/16 0940  . ondansetron (ZOFRAN) tablet 4 mg  4 mg Oral Q6H PRN Fritzi Mandes, MD       Or  . ondansetron (ZOFRAN) injection 4 mg  4 mg Intravenous Q6H PRN Fritzi Mandes, MD      . oxyCODONE (Oxy IR/ROXICODONE) immediate release  tablet 5 mg  5 mg Oral BID PRN Murlean Iba, MD   5 mg at 01/01/16 1227  . pravastatin (PRAVACHOL) tablet 40 mg  40 mg Oral Daily Fritzi Mandes, MD   40 mg at 01/01/16 0940      Allergies: No Known Allergies    Past Medical History: Past Medical History  Diagnosis Date  . Chronic back pain   . Hypertension   . Diabetes mellitus without complication ([place])   . Hypothyroid   . Hyperlipidemia   . Sleep apnea   . Dysrhythmia   . Syncope   . Tremors of nervous system   . GERD (gastroesophageal reflux disease)   . Anemia   . Arthritis      Past Surgical History: Past Surgical History  Procedure Laterality Date  . Abdominal hysterectomy    . Cardiac catheterization    . Oophorectomy    . Colonoscopy    . Back surgery  09/16  . Cataract extraction w/phaco Right 11/24/2015    Procedure: CATARACT EXTRACTION PHACO AND INTRAOCULAR LENS PLACEMENT (IOC);  Surgeon: Estill Cotta, MD;  Location: [facility] ORS;  Service: Ophthalmology;  Laterality: Right;  [place] 01:14 AP% 25.5 CDE 30.69 fluid pack lot # [id number]     Family History: No family history on file.   Social History: Social History   Social History  . Marital Status: Divorced    Spouse Name: N/A  . Number of Children: N/A  . Years of Education: N/A   Occupational History  . Not on file.   Social History Main Topics  . Smoking status: Never Smoker   . Smokeless tobacco: Not on file  . Alcohol Use: Yes  . Drug Use: No  . Sexual Activity: Not Currently   Other Topics Concern  . Not on file   Social History Narrative     Review of Systems: Gen: No fevers or chills HEENT: No complaints CV: No lower extremity edema, no chest pain Resp: No breathing problems, earlier she had experienced shortness of breath prior to admission GI: Appetite is fair, no nausea or vomiting GU : No problems reported MS: Severe chronic back pain due to multiple disc problems Derm:  No acute problems reported Psych:  No problems  reported Heme: No problems reported Neuro: No complaints Endocrine no complaints  Vital Signs: Blood pressure 148/70, pulse 92, temperature 98 F (36.7 C), temperature source Oral, resp. rate 20, height 5\' 1"  (1.549 m), weight 91.173 kg (201 lb), SpO2 100 %.   Intake/Output Summary (Last 24 hours) at 01/01/16 1640 Last data filed at 01/01/16 1000  Gross per 24 hour  Intake 1164.72 ml  Output      0 ml  Net 1164.72 ml    Weight trends: Filed Weights   12/31/15 0915 12/31/15 1700  Weight: 92.534 kg (204 lb) 91.173 kg (201 lb)    Physical Exam: General:  laying in the bed, looks uncomfortable   HEENT  anicteric, moist oral mucous membranes   Neck:  supple   Lungs:  normal respiratory effort, clear to auscultation   Heart::  regular rhythm, no rub or gallop   Abdomen:  soft, nontender   Extremities:  no peripheral edema   Neurologic:  alert, oriented   Skin:  no acute rashes              Lab results: Basic Metabolic Panel:  Recent Labs Lab 12/31/15 0928 01/01/16 0515 01/01/16 1212  NA 125* 128* 128*  K 5.6* 4.3  --   CL 95* 97*  --   CO2 21* 21*  --   GLUCOSE 125* 129*  --   BUN 18 18  --   CREATININE 0.86 0.93  --   CALCIUM 9.2 8.5*  --     Liver Function Tests: No results for input(s): AST, ALT, ALKPHOS, BILITOT, PROT, ALBUMIN in the last 168 hours. No results for input(s): LIPASE, AMYLASE in the last 168 hours. No results for input(s): AMMONIA in the last 168 hours.  CBC:  Recent Labs Lab 12/31/15 0928  WBC 7.9  HGB 13.5  HCT 38.9  MCV 85.6  PLT 262    Cardiac Enzymes:  Recent Labs Lab 12/31/15 0928  TROPONINI <0.03    BNP: Invalid input(s): POCBNP  CBG:  Recent Labs Lab 12/31/15 1719 12/31/15 2108 01/01/16 0723 01/01/16 0913 01/01/16 1131  GLUCAP 97 119* 147* 242* 119*    Microbiology: No results found for this or any previous visit (from the past 720 hour(s)).   Coagulation Studies: No results for input(s): LABPROT,  INR in the last 72 hours.  Urinalysis: No results for input(s): COLORURINE, LABSPEC, PHURINE, GLUCOSEU, HGBUR, BILIRUBINUR, KETONESUR, PROTEINUR, UROBILINOGEN, NITRITE, LEUKOCYTESUR in the last 72 hours.  Invalid input(s): APPERANCEUR      Imaging: Dg Chest 2 View  12/31/2015  CLINICAL DATA:  Increasing shortness of breath for 2 weeks. Weakness. EXAM: CHEST  2 VIEW COMPARISON:  None. FINDINGS: The heart size and mediastinal contours are within normal limits. Minimal scarring or atelectasis in the lingula. Lungs are otherwise clear. No effusions. No osseous abnormality. IMPRESSION: No significant abnormality. Electronically Signed   By: Lorriane Shire M.D.   On: 12/31/2015 09:46   Ct Soft Tissue Neck W Contrast  12/31/2015  CLINICAL DATA:  Shortness of breath when lying flat were looking down. EXAM: CT NECK WITH CONTRAST TECHNIQUE: Multidetector CT imaging of the neck was performed using the standard protocol following the bolus administration of intravenous contrast. CONTRAST:  27mL OMNIPAQUE IOHEXOL 300 MG/ML  SOLN COMPARISON:  MRI of the cervical spine 01/18/2015. FINDINGS: Pharynx and larynx: No focal mucosal or submucosal lesions are present. The tongue base is within normal limits. Vocal cords are midline and symmetric. The airway  is patent throughout the neck. Note is made that the esophagus is gas-filled in the upper mediastinum. This may reflect esophageal motility or reflux disease. Salivary glands: Within normal limits bilaterally. Thyroid: Negative. Lymph nodes: No significant cervical adenopathy is present. A left subclavian node measures 12 x 8 mm in appears flame otorrhea. There is some patient motion in neck region. Vascular: Medial deviation of the right internal carotid artery creates slight bulging on the posterior hypopharynx without airway obstruction. Mild atherosclerotic calcifications are present without significant stenosis. Limited intracranial: Within normal limits Visualized  orbits: The visualized portions are unremarkable. Mastoids and visualized paranasal sinuses: Clear Skeleton: Degenerative changes are present in the upper cervical spine. No focal lytic or blastic lesions are present. The patient is edentulous. Upper chest: Mild ground-glass attenuation is present. No focal airspace disease, nodule, or mass lesion is present. IMPRESSION: 1. The airway is patent without evidence for obstruction. 2. Chronic gas within the upper esophagus. This suggests esophageal motility disorder or possibly reflux disease. This could be giving the patient and abnormal sensation when she lies supine. 3. Minimal ground-glass attenuation at the lung apices likely reflects atelectasis as there was no significant disease present on the chest CT done on the same day. 4. Degenerative changes of the upper cervical spine. Electronically Signed   By: San Morelle M.D.   On: 12/31/2015 13:24   Ct Chest W Contrast  12/31/2015  CLINICAL DATA:  Shortness of breath when lying flat or looking down for 2 weeks, feels like something in her neck is cut upper airway, history hypertension, diabetes mellitus EXAM: CT CHEST WITH CONTRAST TECHNIQUE: Multidetector CT imaging of the chest was performed during intravenous contrast administration. Sagittal and coronal MPR images reconstructed from axial data set. CONTRAST:  71mL OMNIPAQUE IOHEXOL 300 MG/ML  SOLN IV COMPARISON:  None ; correlation chest radiograph 12/31/2015 FINDINGS: Minimal atherosclerotic calcification aorta and coronary arteries. Few scattered normal sized mediastinal nodes without thoracic adenopathy. Visualized portion of upper abdomen normal appearance. Airways patent and unremarkable. Lungs clear. No pulmonary infiltrate, pleural effusion, pneumothorax or definite mass/nodule. Scattered degenerative disc disease changes of the thoracic spine with osseous demineralization. IMPRESSION: No significant intra thoracic abnormalities as above.  Electronically Signed   By: Lavonia Dana M.D.   On: 12/31/2015 13:04     Assessment & Plan: Pt is a 70 y.o. yo female with a PMHX of chronic DM-2, OSA, Syncope, GERD, Parkinson's, chronic back pain who was admitted to [facility] on 12/31/2015 for evaluation of hyponatremia   1. Acute worsening of chronic hyponatremia Review of "care everywhere" records/labs show that in May 2016, her sodium was 126  In August, her sodium level was 134 Admission sodium is 125, today's sodium has improved to 128 It is unclear what previous workup has been done TSH from March 1 is normal  Currently she is getting IV normal saline  Plan: Will obtain SPEP, UPEP, urine sodium and chloride We will obtain a.m. cortisol level Further plans as hospital course progresses

## 2016-01-02 LAB — SODIUM, URINE, RANDOM: Sodium, Ur: 60 mmol/L

## 2016-01-02 LAB — PROTEIN / CREATININE RATIO, URINE
CREATININE, URINE: 70 mg/dL
Protein Creatinine Ratio: 0.1 mg/mg{Cre} (ref 0.00–0.15)
TOTAL PROTEIN, URINE: 7 mg/dL

## 2016-01-02 LAB — BASIC METABOLIC PANEL
ANION GAP: 7 (ref 5–15)
BUN: 15 mg/dL (ref 6–20)
CHLORIDE: 100 mmol/L — AB (ref 101–111)
CO2: 22 mmol/L (ref 22–32)
Calcium: 8.4 mg/dL — ABNORMAL LOW (ref 8.9–10.3)
Creatinine, Ser: 0.85 mg/dL (ref 0.44–1.00)
GFR calc Af Amer: >60 mL/min (ref >60)
GLUCOSE: 137 mg/dL — AB (ref 65–99)
POTASSIUM: 4.5 mmol/L (ref 3.5–5.1)
Sodium: 129 mmol/L — ABNORMAL LOW (ref 135–145)

## 2016-01-02 LAB — GLUCOSE, CAPILLARY
GLUCOSE-CAPILLARY: 138 mg/dL — AB (ref 65–99)
Glucose-Capillary: 141 mg/dL — ABNORMAL HIGH (ref 65–99)

## 2016-01-02 LAB — CHLORIDE, URINE, RANDOM: CHLORIDE URINE: 54 mmol/L

## 2016-01-02 LAB — CORTISOL: Cortisol, Plasma: 12.3 ug/dL

## 2016-01-02 LAB — OSMOLALITY, URINE: OSMOLALITY UR: 304 mosm/kg (ref 300–900)

## 2016-01-02 LAB — SODIUM: SODIUM: 130 mmol/L — AB (ref 135–145)

## 2016-01-02 MED ORDER — PANTOPRAZOLE SODIUM 40 MG PO TBEC
40.0000 mg | DELAYED_RELEASE_TABLET | Freq: Every day | ORAL | Status: DC
  Administered 2016-01-02: 40 mg via ORAL
  Filled 2016-01-02: qty 1

## 2016-01-02 MED ORDER — PANTOPRAZOLE SODIUM 40 MG PO TBEC: 40.0000 mg | DELAYED_RELEASE_TABLET | Freq: Every day | ORAL | Status: DC

## 2016-01-02 NOTE — Discharge Summary (Addendum)
[facility] [place] NAME: Stephanie [name]    MR#:  [id number]  DATE OF BIRTH:  05/25/1946  DATE OF ADMISSION:  12/31/2015 ADMITTING PHYSICIAN: Fritzi Mandes, MD  DATE OF DISCHARGE: *01/02/2016  PRIMARY CARE PHYSICIAN: SPARKS,JEFFREY D, MD    ADMISSION DIAGNOSIS:  Hyperkalemia [E87.5] Hyponatremia [E87.1]  DISCHARGE DIAGNOSIS:  Active Problems:   Hyponatremia   SECONDARY DIAGNOSIS:   Past Medical History  Diagnosis Date  . Chronic back pain   . Hypertension   . Diabetes mellitus without complication ([place])   . Hypothyroid   . Hyperlipidemia   . Sleep apnea   . Dysrhythmia   . Syncope   . Tremors of nervous system   . GERD (gastroesophageal reflux disease)   . Anemia   . Arthritis     HOSPITAL COURSE:   70 year old female who presented with shortness of breath and found to have hyponatremia.. For further details as per H&P.   1. Hyponatremia: Acute on chronic: Sodium level has improved. Patient needs close monitoring of sodium level. Nephrology was consulted during hospital stay. TSH level was normal. SPEP, UPEP are pending and will need to be followed up by PCP.  2. Shortness of breath: This is resolved. Patient is scheduled for outpatient sleep evaluation. I asked her to reschedule this as it seems like she missed this appointment. CT scan of the chest showed no pulmonary emboli and CT of the neck on admission did show chronic changes which is consistent with likely GERD. Patient will be discharged on Protonix to see if this alleviates her symptoms.  3. Chronic lower back pain: Patient will follow up with outpatient orthopedics for MRI.  4. Essential hypertension: Blood pressure controlled while in the hospital. Continue enalapril.  5. Hyperlipidemia: Continue pravastatin.  6. Diabetes: Continue ADA diet and glimepiride and metformin. DISCHARGE CONDITIONS AND DIET:   Stable for discharge on a diabetic  diet  CONSULTS OBTAINED:  Treatment Team:  Murlean Iba, MD Earnestine Leys, MD [place], MD  DRUG ALLERGIES:  No Known Allergies  DISCHARGE MEDICATIONS:   Current Discharge Medication List    START taking these medications   Details  pantoprazole (PROTONIX) 40 MG tablet Take 1 tablet (40 mg total) by mouth daily. Qty: 30 tablet, Refills: 0      CONTINUE these medications which have NOT CHANGED   Details  enalapril (VASOTEC) 20 MG tablet Take 20 mg by mouth 2 (two) times daily.     fluticasone (FLONASE) 50 MCG/ACT nasal spray Place 1-2 sprays into the nose as needed.    glimepiride (AMARYL) 4 MG tablet Take 4 mg by mouth 2 (two) times daily. Pt. Takes 1 tablet in the morning and .5 tablet at night.    levothyroxine (SYNTHROID, LEVOTHROID) 25 MCG tablet Take 25 mcg by mouth daily before breakfast.    meloxicam (MOBIC) 15 MG tablet Take 15 mg by mouth daily.    metFORMIN (GLUCOPHAGE) 1000 MG tablet Take 1,000 mg by mouth 2 (two) times daily with a meal.     Multiple Vitamin (MULTIVITAMIN WITH MINERALS) TABS tablet Take 1 tablet by mouth daily.    pravastatin (PRAVACHOL) 40 MG tablet Take 40 mg by mouth daily.    spironolactone (ALDACTONE) 50 MG tablet Take 50 mg by mouth 2 (two) times daily.      STOP taking these medications     cyclobenzaprine (FLEXERIL) 10 MG tablet      oxyCODONE (OXY IR/ROXICODONE)  5 MG immediate release tablet               Today   CHIEF COMPLAINT:  Patient is doing well this morning. Patient reports no back or hip pain. Patient reports a shortness of breath or chest pain.   VITAL SIGNS:  Blood pressure 166/66, pulse 77, temperature 97.7 F (36.5 C), temperature source Oral, resp. rate 18, height 5\' 1"  (1.549 m), weight 91.173 kg (201 lb), SpO2 98 %.   REVIEW OF SYSTEMS:  Review of Systems  Constitutional: Negative for fever, chills and malaise/fatigue.  HENT: Negative for sore throat.   Eyes: Negative for blurred  vision.  Respiratory: Negative for cough, hemoptysis, shortness of breath and wheezing.   Cardiovascular: Negative for chest pain, palpitations and leg swelling.  Gastrointestinal: Negative for nausea, vomiting, abdominal pain, diarrhea and blood in stool.  Genitourinary: Negative for dysuria.  Musculoskeletal: Negative for myalgias, back pain (not today), joint pain, falls and neck pain (not today).  Neurological: Negative for dizziness, tremors and headaches.  Endo/Heme/Allergies: Does not bruise/bleed easily.  Psychiatric/Behavioral: Negative for depression, suicidal ideas and substance abuse.     PHYSICAL EXAMINATION:  GENERAL:  70 y.o.-year-old patient lying in the bed with no acute distress.  NECK:  Supple, no jugular venous distention. No thyroid enlargement, no tenderness.  LUNGS: Normal breath sounds bilaterally, no wheezing, rales,rhonchi  No use of accessory muscles of respiration.  CARDIOVASCULAR: S1, S2 normal. No murmurs, rubs, or gallops.  ABDOMEN: Soft, non-tender, non-distended. Bowel sounds present. No organomegaly or mass.  EXTREMITIES: No pedal edema, cyanosis, or clubbing.  PSYCHIATRIC: The patient is alert and oriented x 3.  SKIN: No obvious rash, lesion, or ulcer.   DATA REVIEW:   CBC  Recent Labs Lab 12/31/15 0928  WBC 7.9  HGB 13.5  HCT 38.9  PLT 262    Chemistries   Recent Labs Lab 01/02/16 0756  NA 129*  K 4.5  CL 100*  CO2 22  GLUCOSE 137*  BUN 15  CREATININE 0.85  CALCIUM 8.4*    Cardiac Enzymes  Recent Labs Lab 12/31/15 0928  TROPONINI <0.03    Microbiology Results  @[email or web address] @  RADIOLOGY:  Ct Soft Tissue Neck W Contrast  12/31/2015  CLINICAL DATA:  Shortness of breath when lying flat were looking down. EXAM: CT NECK WITH CONTRAST TECHNIQUE: Multidetector CT imaging of the neck was performed using the standard protocol following the bolus administration of intravenous contrast. CONTRAST:  42mL OMNIPAQUE IOHEXOL 300 MG/ML   SOLN COMPARISON:  MRI of the cervical spine 01/18/2015. FINDINGS: Pharynx and larynx: No focal mucosal or submucosal lesions are present. The tongue base is within normal limits. Vocal cords are midline and symmetric. The airway is patent throughout the neck. Note is made that the esophagus is gas-filled in the upper mediastinum. This may reflect esophageal motility or reflux disease. Salivary glands: Within normal limits bilaterally. Thyroid: Negative. Lymph nodes: No significant cervical adenopathy is present. A left subclavian node measures 12 x 8 mm in appears flame otorrhea. There is some patient motion in neck region. Vascular: Medial deviation of the right internal carotid artery creates slight bulging on the posterior hypopharynx without airway obstruction. Mild atherosclerotic calcifications are present without significant stenosis. Limited intracranial: Within normal limits Visualized orbits: The visualized portions are unremarkable. Mastoids and visualized paranasal sinuses: Clear Skeleton: Degenerative changes are present in the upper cervical spine. No focal lytic or blastic lesions are present. The patient is edentulous. Upper chest: Mild ground-glass attenuation  is present. No focal airspace disease, nodule, or mass lesion is present. IMPRESSION: 1. The airway is patent without evidence for obstruction. 2. Chronic gas within the upper esophagus. This suggests esophageal motility disorder or possibly reflux disease. This could be giving the patient and abnormal sensation when she lies supine. 3. Minimal ground-glass attenuation at the lung apices likely reflects atelectasis as there was no significant disease present on the chest CT done on the same day. 4. Degenerative changes of the upper cervical spine. Electronically Signed   By: San Morelle M.D.   On: 12/31/2015 13:24   Ct Chest W Contrast  12/31/2015  CLINICAL DATA:  Shortness of breath when lying flat or looking down for 2 weeks,  feels like something in her neck is cut upper airway, history hypertension, diabetes mellitus EXAM: CT CHEST WITH CONTRAST TECHNIQUE: Multidetector CT imaging of the chest was performed during intravenous contrast administration. Sagittal and coronal MPR images reconstructed from axial data set. CONTRAST:  5mL OMNIPAQUE IOHEXOL 300 MG/ML  SOLN IV COMPARISON:  None ; correlation chest radiograph 12/31/2015 FINDINGS: Minimal atherosclerotic calcification aorta and coronary arteries. Few scattered normal sized mediastinal nodes without thoracic adenopathy. Visualized portion of upper abdomen normal appearance. Airways patent and unremarkable. Lungs clear. No pulmonary infiltrate, pleural effusion, pneumothorax or definite mass/nodule. Scattered degenerative disc disease changes of the thoracic spine with osseous demineralization. IMPRESSION: No significant intra thoracic abnormalities as above. Electronically Signed   By: Lavonia Dana M.D.   On: 12/31/2015 13:04      Management plans discussed with the patient and she is in agreement. Stable for discharge home  Patient should follow up with PCP   CODE STATUS:     Code Status Orders        Start     Ordered   12/31/15 1702  Full code   Continuous     12/31/15 1701    Code Status History    Date Active Date Inactive Code Status Order ID Comments User Context   This patient has a current code status but no historical code status.      TOTAL TIME TAKING CARE OF THIS PATIENT: 35 minutes.    Note: This dictation was prepared with Dragon dictation along with smaller phrase technology. Any transcriptional errors that result from this process are unintentional.  Stevie Ertle M.D on 01/02/2016 at 11:11 AM  Between 7am to 6pm - Pager - [phone] After 6pm go to [email or web address] - password [email or web address] [facility]  Office  [phone]  CC: Primary care physician; [name], MD

## 2016-01-02 NOTE — Care Management Important Message (Signed)
Important Message  Patient Details  Name: Stephanie [name] MRN: [id number] Date of Birth: 01-08-1946   Medicare Important Message Given:  Yes    [name] 01/02/2016, 10:27 AM

## 2016-01-02 NOTE — Progress Notes (Signed)
Patient discharged to home as ordered. Discharge instructions given to patient. Prescription sent to patient pharmacy. Follow up appointment 01/07/2016 at 300pm. Patient is alert and oriented. Ambulates with minimal assistance. No acute distress noted.

## 2016-01-05 LAB — PROTEIN ELECTROPHORESIS, SERUM
A/G RATIO SPE: 1.2 (ref 0.7–1.7)
Albumin ELP: 3.5 g/dL (ref 2.9–4.4)
Alpha-1-Globulin: 0.2 g/dL (ref 0.0–0.4)
Alpha-2-Globulin: 0.8 g/dL (ref 0.4–1.0)
Beta Globulin: 0.9 g/dL (ref 0.7–1.3)
GLOBULIN, TOTAL: 2.9 g/dL (ref 2.2–3.9)
Gamma Globulin: 1 g/dL (ref 0.4–1.8)
Total Protein ELP: 6.4 g/dL (ref 6.0–8.5)

## 2016-01-05 LAB — PROTEIN ELECTRO, RANDOM URINE
Albumin ELP, Urine: 20.6 %
Alpha-1-Globulin, U: 4.6 %
Alpha-2-Globulin, U: 9.9 %
Beta Globulin, U: 31.9 %
Gamma Globulin, U: 33 %
Total Protein, Urine: 14.8 mg/dL

## 2016-01-06 ENCOUNTER — Other Ambulatory Visit: Payer: Self-pay | Admitting: Orthopedic Surgery

## 2016-01-06 DIAGNOSIS — M5416 Radiculopathy, lumbar region: Secondary | ICD-10-CM

## 2016-01-27 ENCOUNTER — Ambulatory Visit
Admission: RE | Admit: 2016-01-27 | Discharge: 2016-01-27 | Disposition: A | Discharge status: Home / Self Care | Payer: Medicare Other | Source: Ambulatory Visit | Attending: Orthopedic Surgery | Admitting: Orthopedic Surgery

## 2016-01-27 DIAGNOSIS — M5125 Other intervertebral disc displacement, thoracolumbar region: Secondary | ICD-10-CM | POA: Diagnosis not present

## 2016-01-27 DIAGNOSIS — M5124 Other intervertebral disc displacement, thoracic region: Secondary | ICD-10-CM | POA: Insufficient documentation

## 2016-01-27 DIAGNOSIS — M4802 Spinal stenosis, cervical region: Secondary | ICD-10-CM | POA: Diagnosis not present

## 2016-01-27 DIAGNOSIS — M5412 Radiculopathy, cervical region: Secondary | ICD-10-CM | POA: Diagnosis present

## 2016-01-27 DIAGNOSIS — M4806 Spinal stenosis, lumbar region: Secondary | ICD-10-CM | POA: Diagnosis not present

## 2016-01-27 DIAGNOSIS — M5416 Radiculopathy, lumbar region: Secondary | ICD-10-CM | POA: Diagnosis present

## 2016-03-03 ENCOUNTER — Other Ambulatory Visit: Payer: Self-pay | Admitting: Neurology

## 2016-03-03 DIAGNOSIS — G20A1 Parkinson's disease without dyskinesia, without mention of fluctuations: Secondary | ICD-10-CM

## 2016-03-03 DIAGNOSIS — G20 Parkinson's disease: Secondary | ICD-10-CM

## 2016-03-17 ENCOUNTER — Ambulatory Visit
Admission: RE | Admit: 2016-03-17 | Discharge: 2016-03-17 | Disposition: A | Discharge status: Home / Self Care | Payer: Medicare Other | Source: Ambulatory Visit | Attending: Neurology | Admitting: Neurology

## 2016-03-17 DIAGNOSIS — G20 Parkinson's disease: Secondary | ICD-10-CM | POA: Diagnosis not present

## 2016-03-17 DIAGNOSIS — R251 Tremor, unspecified: Secondary | ICD-10-CM | POA: Insufficient documentation

## 2016-03-17 DIAGNOSIS — R9082 White matter disease, unspecified: Secondary | ICD-10-CM | POA: Insufficient documentation

## 2016-03-17 DIAGNOSIS — G20A1 Parkinson's disease without dyskinesia, without mention of fluctuations: Secondary | ICD-10-CM

## 2016-03-17 MED ORDER — GADOBENATE DIMEGLUMINE 529 MG/ML IV SOLN
20.0000 mL | Freq: Once | INTRAVENOUS | Status: AC | PRN
  Administered 2016-03-17: 19 mL via INTRAVENOUS

## 2016-04-27 IMAGING — CR DG CHEST 2V
1 series · 2 of 2 positions shown · non-contrast
Comparison: None.

CLINICAL DATA: Increasing shortness of breath for 2 weeks.
Weakness.

EXAM:
CHEST  2 VIEW

[Series 1: dg chest 2 view · 0.14mm/px · 2 of 2 slices shown]
[im 1/2]
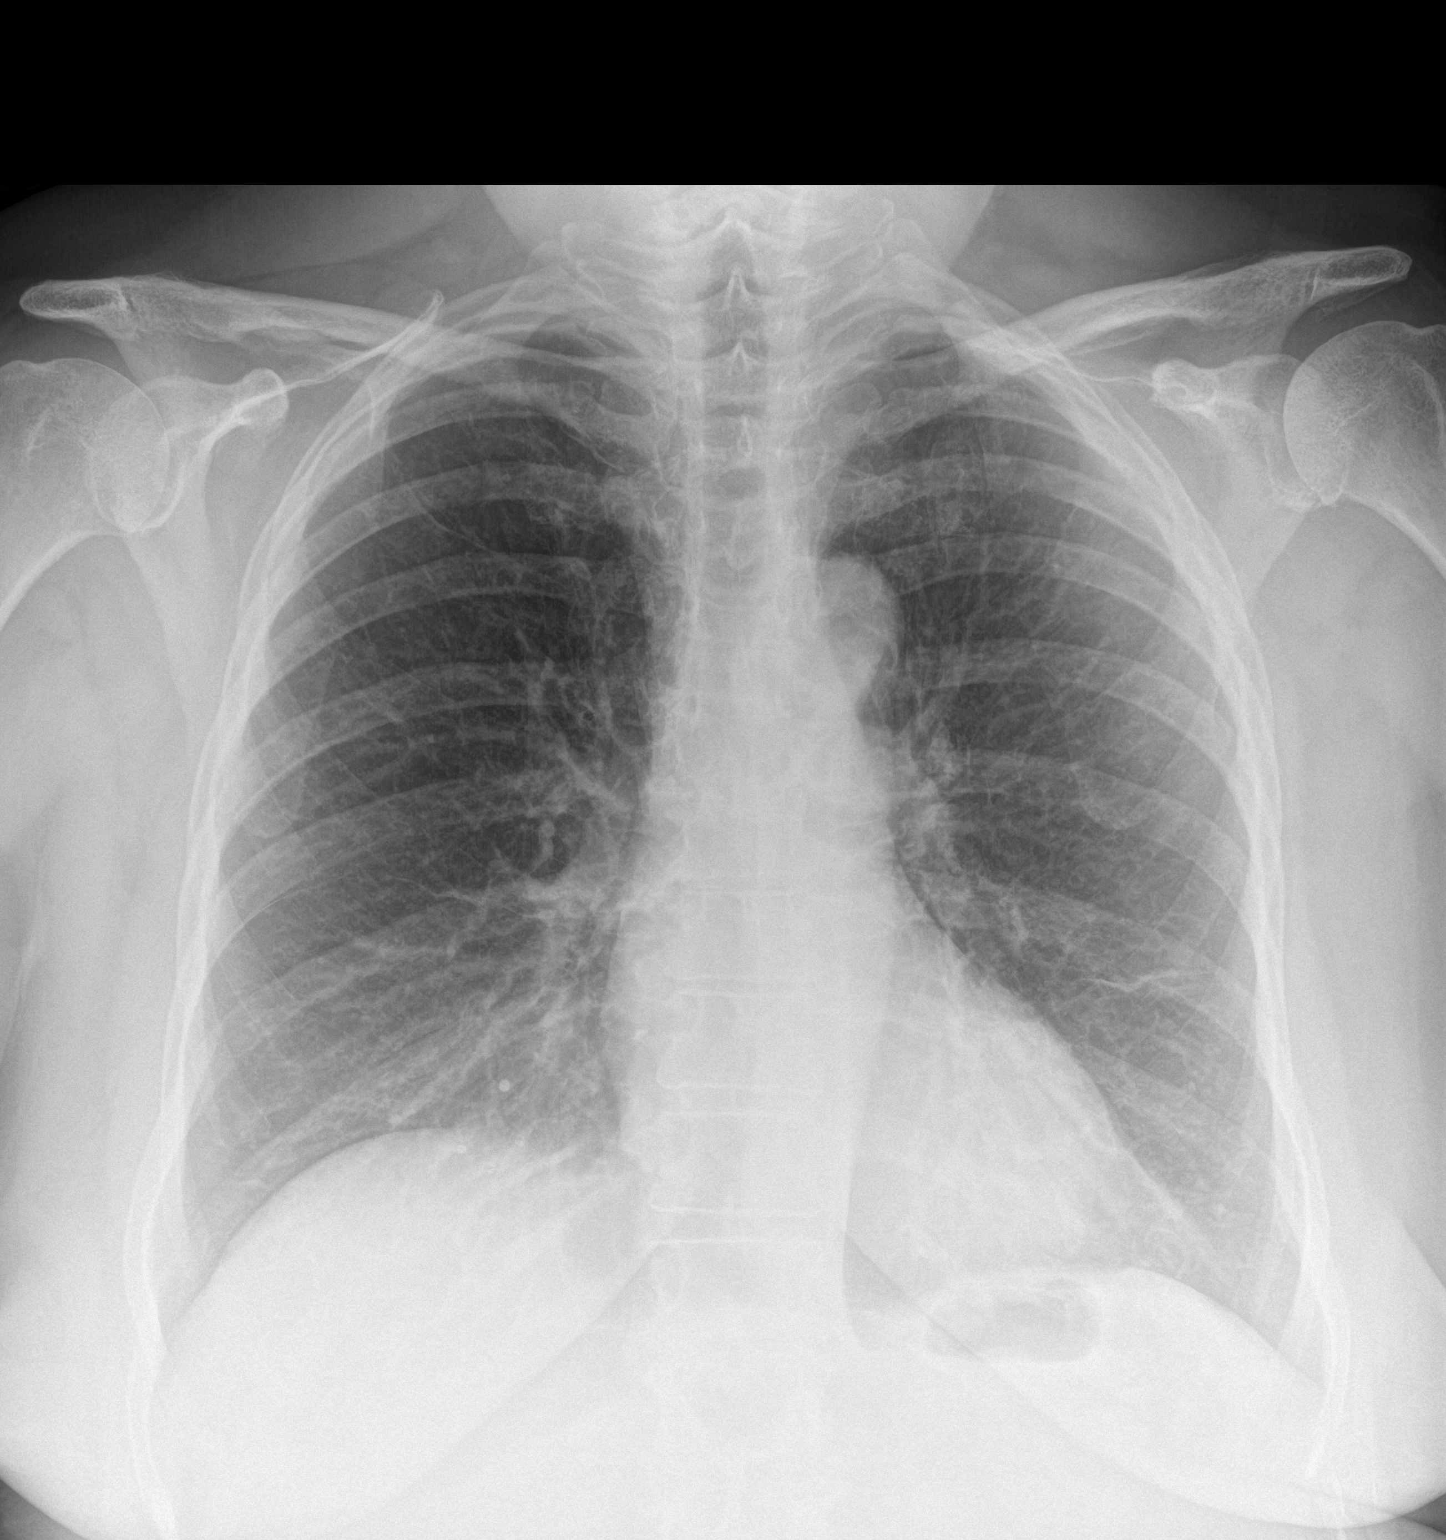
[im 2/2]
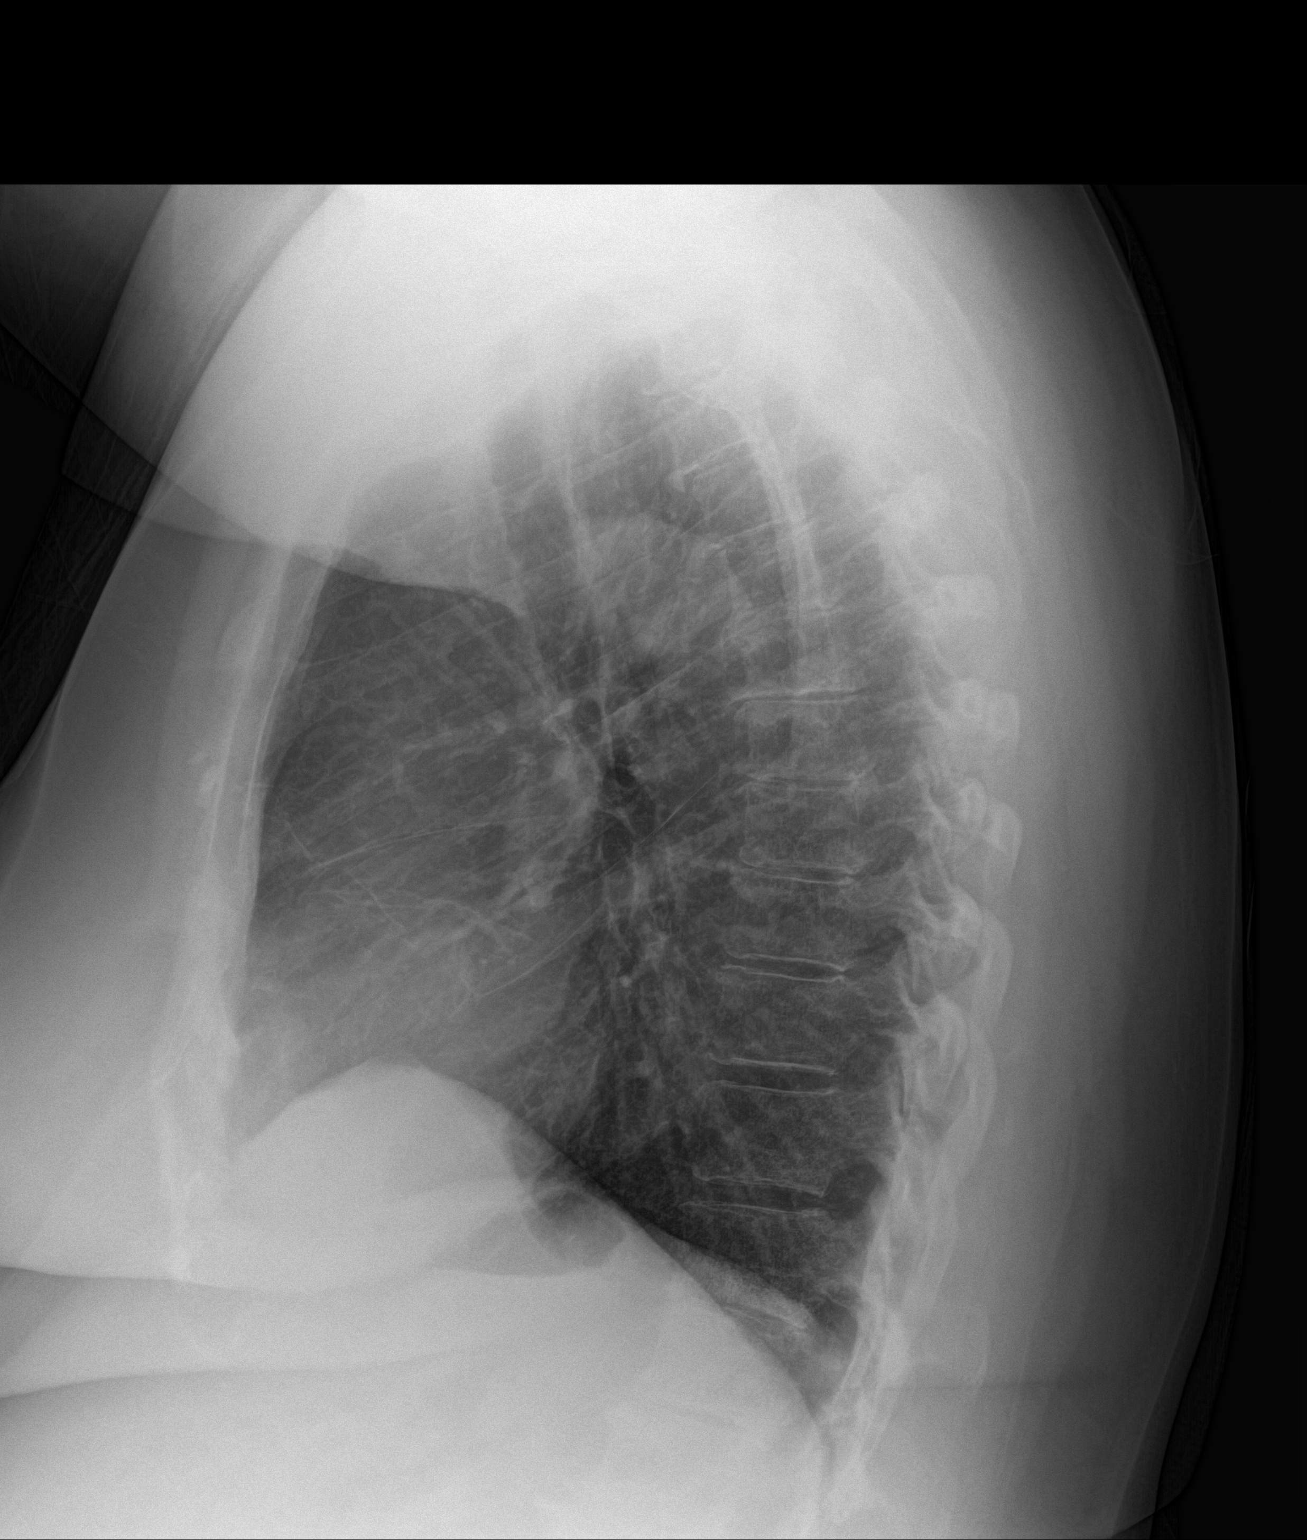

[2 of 2 positions shown; findings below may reference images not displayed]

FINDINGS: The heart size and mediastinal contours are within normal limits.
Minimal scarring or atelectasis in the lingula. Lungs are otherwise
clear. No effusions. No osseous abnormality.
IMPRESSION: No significant abnormality.

## 2016-05-05 ENCOUNTER — Other Ambulatory Visit: Payer: Self-pay | Admitting: Internal Medicine

## 2016-05-05 DIAGNOSIS — Z1231 Encounter for screening mammogram for malignant neoplasm of breast: Secondary | ICD-10-CM

## 2016-05-18 ENCOUNTER — Ambulatory Visit
Admission: RE | Admit: 2016-05-18 | Discharge: 2016-05-18 | Disposition: A | Discharge status: Home / Self Care | Payer: Medicare Other | Source: Ambulatory Visit | Attending: Internal Medicine | Admitting: Internal Medicine

## 2016-05-18 ENCOUNTER — Other Ambulatory Visit: Payer: Self-pay | Admitting: Internal Medicine

## 2016-05-18 DIAGNOSIS — Z1231 Encounter for screening mammogram for malignant neoplasm of breast: Secondary | ICD-10-CM | POA: Insufficient documentation

## 2016-05-24 IMAGING — MR MR LUMBAR SPINE W/O CM
4 of 5 series · 14 of 48 positions shown · non-contrast
Comparison: 03/08/2015 MR.

CLINICAL DATA: 69-year-old female with lower back and bilateral leg
pain worse on the left with numbness and tingling. Symptoms for 5
years worse over the past year. Subsequent encounter.

EXAM:
MRI LUMBAR SPINE WITHOUT CONTRAST
TECHNIQUE: Multiplanar, multisequence MR imaging of the lumbar spine was
performed. No intravenous contrast was administered.

[Series 3: T2 · sagittal · 4.0mm · 0.44mm/px · 5 of 17 slices shown (1 of 2)]
[im 1/17]
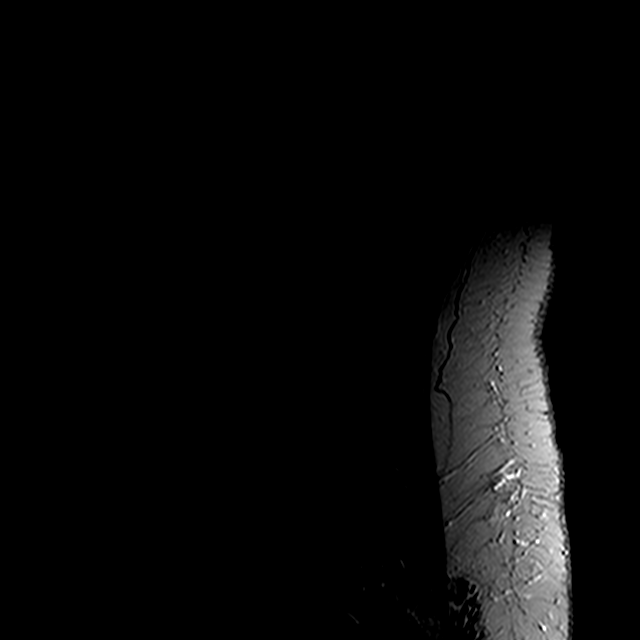
[im 4/17]
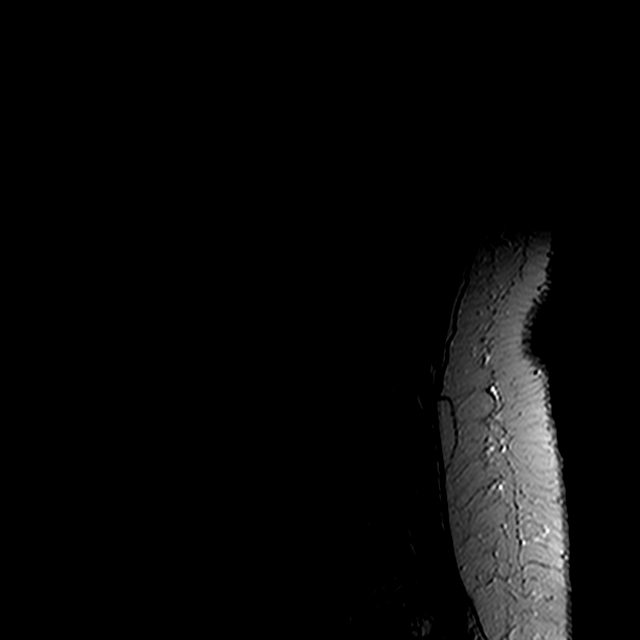
[im 7/17]
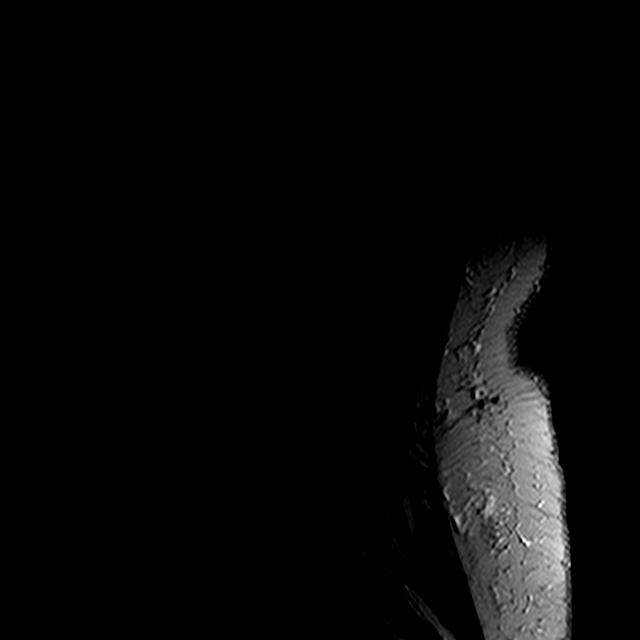
[im 10/17]
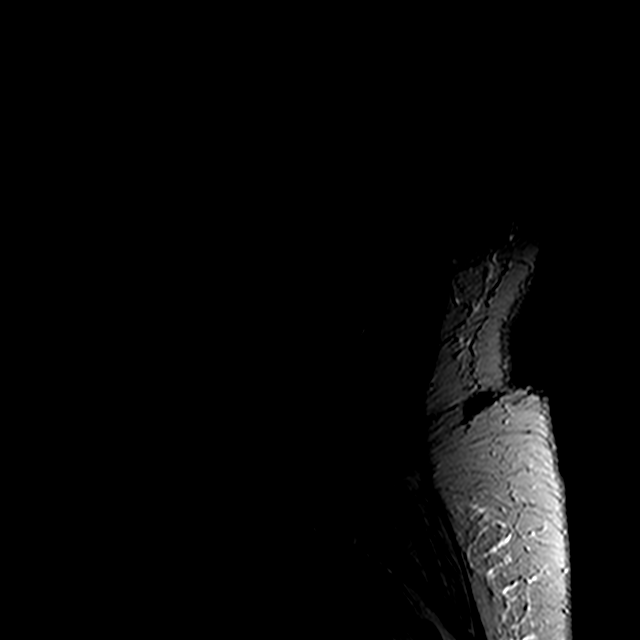
[im 17/17]
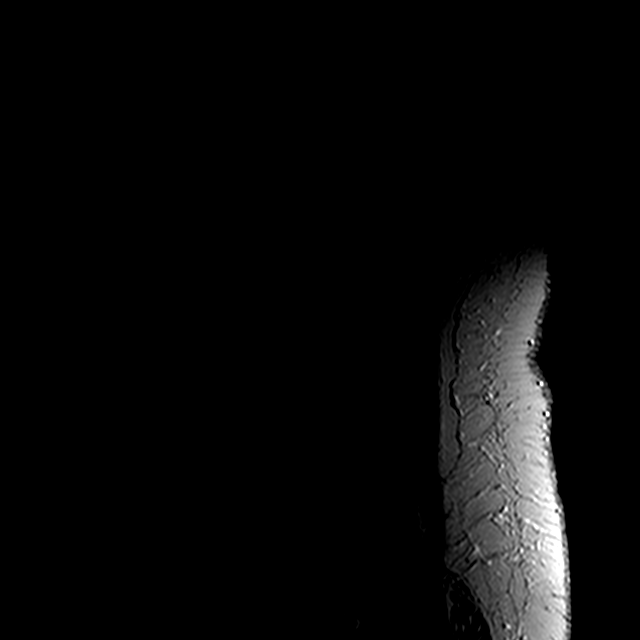

[Series 4: T1 · sagittal · 4.0mm · 0.44mm/px · 3 of 17 slices shown (1 of 2)]
[im 3/17]
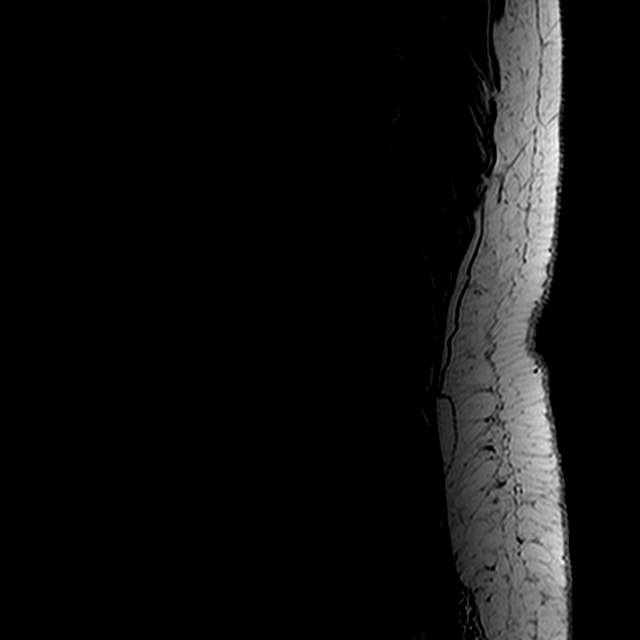
[im 9/17]
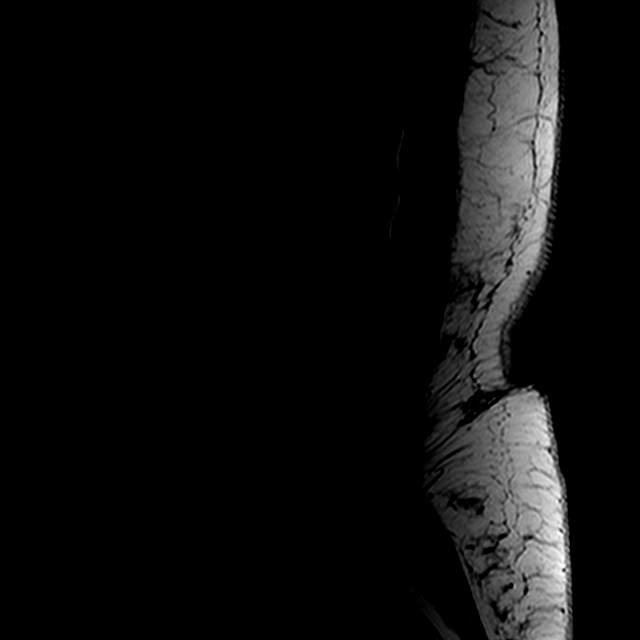
[im 14/17]
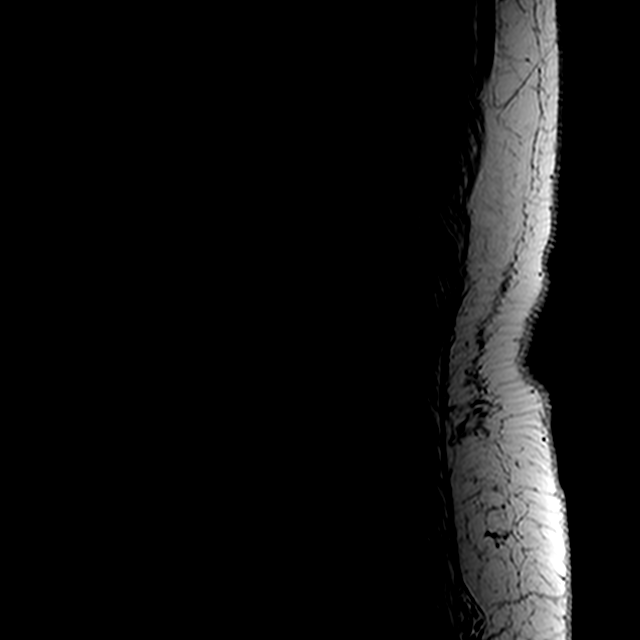

[Series 6: T2 · axial · 4.0mm · 0.39mm/px · z∈[-45,+81]mm · 3 of 34 slices shown (2 of 2)]
[im 6/34]
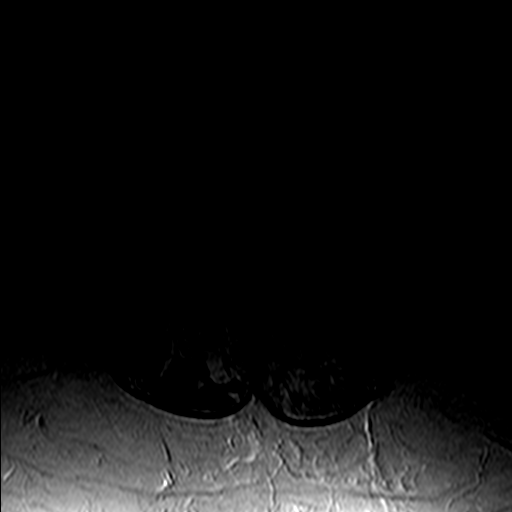
[im 18/34]
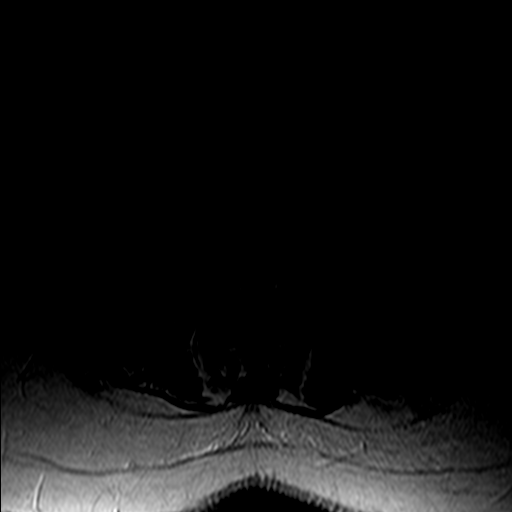
[im 28/34]
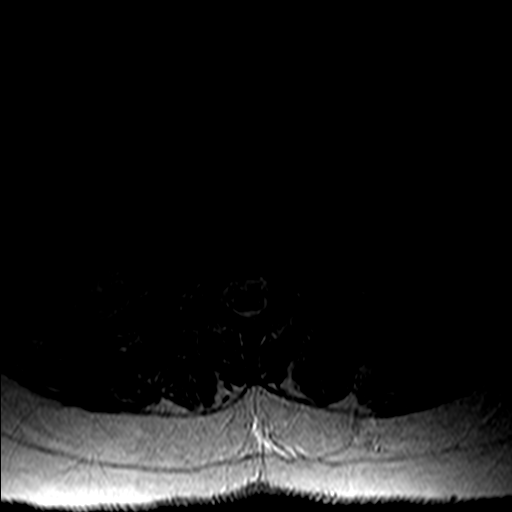

[Series 7: T1 · axial · 4.0mm · 0.39mm/px · z∈[-45,+81]mm · 3 of 34 slices shown (2 of 2)]
[im 6/34]
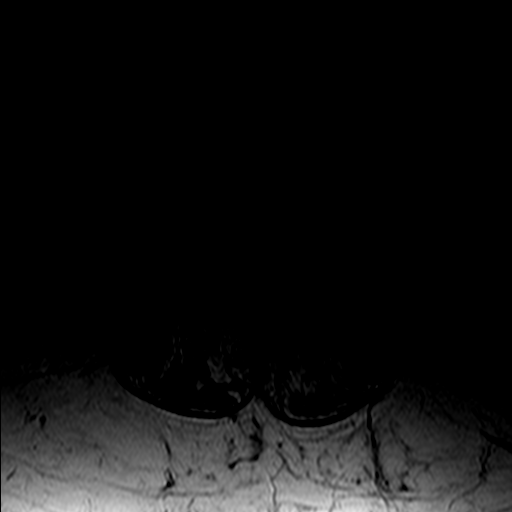
[im 18/34]
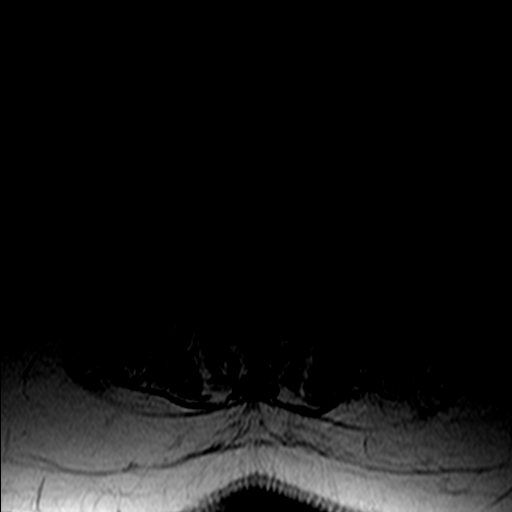
[im 28/34]
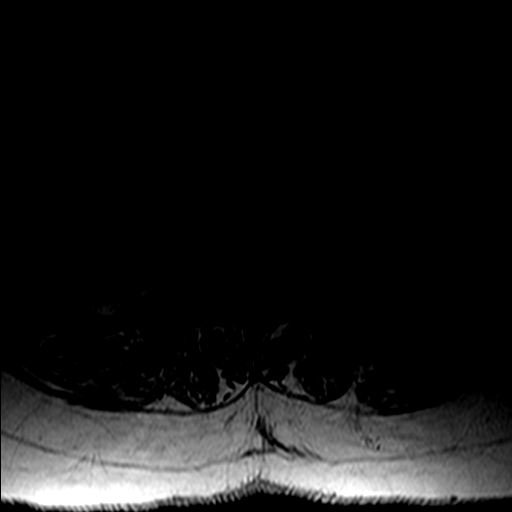

[14 of 48 positions shown; findings below may reference images not displayed]

FINDINGS: Sequences are motion degraded.

Last fully open disk space is labeled L5-S1. Present examination
incorporates from T9-10 disc space through the lower sacrum.

Conus L2 level.

No focal worrisome osseous abnormality.

3.4 cm right renal cyst.

Each of visualized discs from the T12-L1 through L5-S1 lumbar
degenerated.

T9-10: Bulge with narrowing ventral thecal sac. Minimal cord
contact.

T10-11:  Negative.

T11-12:  Minimal Schmorl's node deformity.

T12-L1: Bulge with superimposed small central protrusion. Minimal
indentation ventral thecal sac without cord compression. Mild facet
degenerative changes.

L1-2: Bulge. Facet degenerative changes. Very mild spinal stenosis
and very mild bilateral foraminal narrowing.

L2-3: Mild retrolisthesis L2. Broad-based disc osteophyte complex
greater to the right. Facet degenerative changes. Mild to moderate
right-sided and mild left-sided subarticular recess stenosis. Mild
spinal stenosis greater on the right. Mild bilateral foraminal
narrowing.

L3-4: Mild retrolisthesis L3. Broad-based disc osteophyte complex.
Facet degenerative changes. Decompression without significant spinal
stenosis. Moderate bilateral foraminal narrowing.

L4-5: Facet degenerative changes. Broad-based disc osteophyte
complex. Decompression without significant spinal stenosis. Moderate
bilateral foraminal narrowing.

L5-S1: Prominent facet degenerative changes greater on the left.
Broad-based disc osteophyte complex with foraminal/lateral extension
greater on left. Left greater right foraminal narrowing with
encroachment upon the exiting L5 nerve roots greater on the left.
Mild to moderate bilateral subarticular recess stenosis. Epidural
fat with mild narrowing of the thecal sac greatest in a right to
left direction.
IMPRESSION: Interval decompression of previously noted L3-4 and L4-5 spinal
stenosis. L3-4 and L4-5 moderate bilateral foraminal narrowing.

L5-S1 left greater right multifactorial foraminal narrowing with
encroachment upon the exiting L5 nerve roots more notable on the
left. Mild to moderate bilateral subarticular recess stenosis. Mild
narrowing of the thecal sac in a right to left direction.

L2-3 multifactorial mild to moderate right-sided and mild left-sided
subarticular recess stenosis. Mild spinal stenosis greater on right.
Mild bilateral foraminal narrowing.

L1-2 very mild spinal stenosis and very mild bilateral foraminal
narrowing.

T12-L1 small central protrusion. Minimal indentation ventral thecal
sac without cord compression.

T9-10 bulge with narrowing ventral thecal sac. Minimal cord contact.

## 2016-05-25 ENCOUNTER — Ambulatory Visit: Payer: Medicare Other | Attending: Neurology

## 2016-05-25 DIAGNOSIS — R0683 Snoring: Secondary | ICD-10-CM | POA: Diagnosis present

## 2016-05-25 DIAGNOSIS — G4733 Obstructive sleep apnea (adult) (pediatric): Secondary | ICD-10-CM | POA: Diagnosis present

## 2016-06-14 ENCOUNTER — Encounter: Payer: Self-pay | Admitting: Speech Pathology

## 2016-06-14 ENCOUNTER — Ambulatory Visit: Payer: Medicare Other | Attending: Neurology | Admitting: Speech Pathology

## 2016-06-14 ENCOUNTER — Ambulatory Visit: Payer: Medicare Other | Admitting: Physical Therapy

## 2016-06-14 ENCOUNTER — Encounter: Payer: Self-pay | Admitting: Physical Therapy

## 2016-06-14 DIAGNOSIS — R490 Dysphonia: Secondary | ICD-10-CM | POA: Diagnosis not present

## 2016-06-14 DIAGNOSIS — R262 Difficulty in walking, not elsewhere classified: Secondary | ICD-10-CM | POA: Diagnosis present

## 2016-06-14 NOTE — Therapy (Signed)
[place] MAIN [facility] SERVICES [address] [place], [place], [address] Phone: [phone]   Fax:  [phone]  Physical Therapy Evaluation  Patient Details  Name: Stephanie [name] MRN: [id number] Date of Birth: [birth year] Referring Provider: Jennings Books K   Encounter Date: 06/14/2016      PT End of Session - 06/14/16 1028    Visit Number 1   Number of Visits 17   Date for PT Re-Evaluation 08-04-16   Authorization Type g codes   Authorization Time Period 1/10   PT Start Time 1020   PT Stop Time 1105   PT Time Calculation (min) 45 min   Equipment Utilized During Treatment Gait belt   Activity Tolerance Patient tolerated treatment well;Patient limited by fatigue;Patient limited by pain      Past Medical History:  Diagnosis Date  . Anemia   . Arthritis   . Chronic back pain   . Diabetes mellitus without complication ([place])   . Dysrhythmia   . GERD (gastroesophageal reflux disease)   . Hyperlipidemia   . Hypertension   . Hypothyroid   . Sleep apnea   . Syncope   . Tremors of nervous system     Past Surgical History:  Procedure Laterality Date  . ABDOMINAL HYSTERECTOMY    . BACK SURGERY  09/16  . CARDIAC CATHETERIZATION    . CATARACT EXTRACTION W/PHACO Right 11/24/2015   Procedure: CATARACT EXTRACTION PHACO AND INTRAOCULAR LENS PLACEMENT (IOC);  Surgeon: Estill Cotta, MD;  Location: [facility] ORS;  Service: Ophthalmology;  Laterality: Right;  [place] 01:14 AP% 25.5 CDE 30.69 fluid pack lot # [id number]  . COLONOSCOPY    . OOPHORECTOMY      There were no vitals filed for this visit.       Subjective Assessment - 06/14/16 1025    Subjective Patient says she is here for her parkinsons   Limitations Walking   How long can you sit comfortably? yes   How long can you stand comfortably? 10 mintues   How long can you walk comfortably? a few minutes   Patient Stated Goals to be able to walk longer   Currently in Pain? Yes    Pain Score 6    Pain Location Back   Pain Descriptors / Indicators Aching   Pain Type Acute pain   Pain Onset Today   Pain Frequency Intermittent   Aggravating Factors  walking   Pain Relieving Factors pain pills   Multiple Pain Sites No            [facility] PT Assessment - 06/14/16 0001      Assessment   Medical Diagnosis parkinsons   Referring Provider [facility], [facility] K    Onset Date/Surgical Date 05/03/16   Hand Dominance Right   Prior Therapy --  no     Precautions   Precautions Fall     Restrictions   Weight Bearing Restrictions No     Balance Screen   Has the patient fallen in the past 6 months Yes   How many times? 3   Has the patient had a decrease in activity level because of a fear of falling?  Yes   Is the patient reluctant to leave their home because of a fear of falling?  No     Home Environment   Living Environment Private residence   Living Arrangements Alone   Available Help at Discharge Family   Type of [place]  to enter   Entrance Stairs-Number of Steps 3   Entrance Stairs-Rails None   Home Layout One level   Home Equipment --  has a walking stick     Prior Function   Level of Independence Independent with basic ADLs;Independent with homemaking with ambulation;Independent with household mobility with device   Vocation On disability   Leisure WATCH tv, WORD PUZZLES     Cognition   Overall Cognitive Status Within Functional Limits for tasks assessed   Attention Focused      PAIN: 6/ 10 back pain  POSTURE: WFL   PROM/AROM:  WFL    STRENGTH:  Graded on a 0-5 scale Muscle Group Left Right  Shoulder flex    Shoulder Abd    Shoulder Ext    Shoulder IR/ER    Elbow    Wrist/hand    Hip Flex -3/5 -3/5  Hip Abd -3/5 -3/5  Hip Add    Hip Ext -3/5 -3/5  Hip IR/ER    Knee Flex 3/5 3/5  Knee Ext 3/5 3/5  Ankle DF 4/5 4/5  Ankle PF 4/5 4/5   SENSATION: reports of left side numbness and tingling     FUNCTIONAL  MOBILITY: decreased speed with all mobility    BALANCE: unable to tandem stand, unable to single leg stand   GAIT: Ambulates with walking stick and slow gait speed with decreased arm swing  OUTCOME MEASURES: TEST Outcome Interpretation  5 times sit<>stand 31.43sec >60 yo, >15 sec indicates increased risk for falls  10 meter walk test         .29        m/s <1.0 m/s indicates increased risk for falls; limited community ambulator  Timed up and Go      35 sec           sec <14 sec indicates increased risk for falls  6 minute walk test    420            Feet 1000 feet is community Water quality scientist  <36/56 (100% risk for falls), 37-45 (80% risk for falls); 46-51 (>50% risk for falls); 52-55 (lower risk <25% of falls)  9 Hole Peg Test L:   47.52             R: 24.21                           PT Education - 06/14/16 1027    Education provided Yes   Education Details plan of care   Person(s) Educated Patient   Methods Explanation   Comprehension Verbalized understanding             PT Long Term Goals - 06/14/16 1031      PT LONG TERM GOAL #1   Title Patient will be independent in home exercise program to improve strength/mobility for better functional independence with ADLs.   Time 4   Period Weeks   Status New     PT LONG TERM GOAL #2   Title Patient (> 54 years old) will complete five times sit to stand test in < 15 seconds indicating an increased LE strength and improved balance.   Time 4   Period Weeks   Status New     PT LONG TERM GOAL #3   Title Patient will increase six minute walk test distance to >1000 for progression to community ambulator and improve gait ability   Time  4   Period Weeks   Status New     PT LONG TERM GOAL #4   Title Patient will increase 10 meter walk test to >1.73m/s as to improve gait speed for better community ambulation and to reduce fall risk.   Time 4   Period Weeks   Status New                Plan - June 26, 2016 1029    Clinical Impression Statement Patient presents with complaints of back pain and inability to ambulate intermediate and long distances. She has decreased strength, decreased static and dynamic standing balance and will benefit from LSVT BIG to reach goals of improved mobility.    Rehab Potential Good   PT Frequency 4x / week   PT Duration 4 weeks   PT Treatment/Interventions Therapeutic exercise;Balance training;Neuromuscular re-education;Therapeutic activities;Gait training   PT Next Visit Plan LSVT BIG   PT Home Exercise Plan LSVT BIG   Consulted and Agree with Plan of Care Patient      Patient will benefit from skilled therapeutic intervention in order to improve the following deficits and impairments:  Abnormal gait, Decreased balance, Decreased mobility, Difficulty walking, Impaired sensation, Obesity, Pain, Impaired flexibility, Decreased strength, Decreased coordination, Decreased activity tolerance  Visit Diagnosis: Difficulty in walking, not elsewhere classified      G-Codes - 06-26-2016 1033    Functional Assessment Tool Used TIUG, 5 x sit to stand, 10 MW, 6 Mw   Functional Limitation Mobility: Walking and moving around   Mobility: Walking and Moving Around Current Status [id number]) At least 40 percent but less than 60 percent impaired, limited or restricted   Mobility: Walking and Moving Around Goal Status [phone]) At least 20 percent but less than 40 percent impaired, limited or restricted       Problem List Patient Active Problem List   Diagnosis Date Noted  . Hyponatremia 12/31/2015  Alanson Puls, PT, DPT  [place], Minette Headland S 06/26/2016, 11:05 AM  [place] MAIN [facility] SERVICES [address] [place], [place], [address] Phone: [phone]   Fax:  [phone]  Name: Stephanie [name] MRN: [id number] Date of Birth: [birth year]

## 2016-06-14 NOTE — Therapy (Signed)
[place] MAIN [facility] SERVICES [address] [place], [place], [address] Phone: [phone]   Fax:  [phone]  Speech Language Pathology Evaluation  Patient Details  Name: Stephanie [name] MRN: [id number] Date of Birth: [birth year] Referring Provider: Dr. Manuella Ghazi  Encounter Date: 06/14/2016      End of Session - 06/14/16 1309    Visit Number 1   Number of Visits 17   Date for SLP Re-Evaluation 07/16/16   SLP Start Time 0900   SLP Stop Time  0950   SLP Time Calculation (min) 50 min   Activity Tolerance Patient tolerated treatment well      Past Medical History:  Diagnosis Date   Anemia    Arthritis    Chronic back pain    Diabetes mellitus without complication ([place])    Dysrhythmia    GERD (gastroesophageal reflux disease)    Hyperlipidemia    Hypertension    Hypothyroid    Sleep apnea    Syncope    Tremors of nervous system     Past Surgical History:  Procedure Laterality Date   ABDOMINAL HYSTERECTOMY     BACK SURGERY  09/16   CARDIAC CATHETERIZATION     CATARACT EXTRACTION W/PHACO Right 11/24/2015   Procedure: CATARACT EXTRACTION PHACO AND INTRAOCULAR LENS PLACEMENT ([place]);  Surgeon: Estill Cotta, MD;  Location: [facility] ORS;  Service: Ophthalmology;  Laterality: Right;  [place] 01:14 AP% 25.5 CDE 30.69 fluid pack lot # [id number]   COLONOSCOPY     OOPHORECTOMY      There were no vitals filed for this visit.          SLP Evaluation [facility] - 06/14/16 1242      SLP Visit Information   SLP Received On 06/14/16   Referring Provider Dr. Manuella Ghazi   Onset Date 03/04/2016   Medical Diagnosis Parkinson's disease     Subjective   Patient/Family Stated Goal Improved intelligibility     Prior Functional Status   Cognitive/Linguistic Baseline Within functional limits     Oral Motor/Sensory Function   Overall Oral Motor/Sensory Function Appears within functional limits for tasks assessed     Motor Speech   Overall Motor Speech Impaired   Phonation Low vocal intensity;Hoarse   Phonation Impaired   Volume Soft   Pitch Low     Standardized Assessments   Standardized Assessments  Other Assessment       LSVT-LOUD Voice Evaluation Voice history: The patient reports that speech/voice changes due to Parkinsons; changes include hypophonia, hoarse vocal quality, monotone speech, and decreased facial expressions.   Maximum phonation time for sustained ah: 25 seconds Mean intensity during sustained ah: 69 dB  Mean intensity sustained during conversational speech: 65 dB Average time patient was able to sustain /s/: 10 seconds Average time patient was able to sustain /z/: 22 seconds s/z ratio : 0.5 Highest dynamic pitch when altering pitch from a low note to a high note: 446 Hz Highest pitch during conversational speech: 481 Hz Lowest dynamic pitch when altering from a high note to a low note: 128 Hz Lowest pitch during conversational speech: 125 Hz  Average fundamental frequency during sustained ah: 192 Hz (1.9 STD below mean for age and gender) Visi-Pitch: Museum/gallery exhibitions officer (MDVP)  MDVP extracts objective quantitative values (Relative Average Perturbation, Shimmer, Voice Turbulence Index, and Noise to Harmonic Ratio) on sustained phonation, which are displayed graphically and numerically in comparison to a built-in normative database.  The patient exhibited values outside the  norm for Relative Average Perturbation, Shimmer, Voice Turbulence Index, and Noise to Harmonic Ratio.  Average fundamental frequency was 1.7 STD below the average for age and gender. The patient improved all parameters when cued to alter voicing (loud like me).  Simulatability: Improved vocal quality with loud voice.                   SLP Education - 06/14/16 1308    Education provided Yes   Education Details LSVT-LOUD   Person(s) Educated Patient   Methods Explanation   Comprehension  Verbalized understanding            SLP Long Term Goals - 06/14/16 1311      SLP LONG TERM GOAL #1   Title The patient will complete Daily Tasks (Maximum duration "ah", High/Lows, and Functional Phrases) at average loudness of 80 dB and with loud, good quality voice.    Time 4   Period Weeks   Status New     SLP LONG TERM GOAL #2   Title The patient will complete Hierarchal Speech Loudness reading drills (words/phrases, sentences, and paragraph) at average 75 dB and with loud, good quality voice.     Time 4   Period Weeks   Status New     SLP LONG TERM GOAL #3   Title The patient will participate in conversation, maintaining average loudness of 75 dB and loud, good quality voice.   Time 4   Period Weeks   Status New     SLP LONG TERM GOAL #4   Title The patient will complete homework daily.   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  Based on stimulability testing, the patient is judged to be a good candidate for the LSVT LOUD program.  It is recommended that the patient receive the LSVT LOUD program which is comprised of 16 intensive sessions (4 times per week for 4 weeks, one hour sessions).  Prognosis for improvement is good based on motivation, stimulability, and strong family support.  LSVT LOUD has been documented in the literature as efficacious for individuals with Parkinson's disease.     Speech Therapy Frequency 4x / week   Duration 4 weeks   Treatment/Interventions Other (comment)  LSVT-LOUD   Potential to Achieve Goals Good   Potential Considerations Ability to learn/carryover information;Cooperation/participation level;Previous level of function;Severity of impairments;Family/community support   SLP Home Exercise Plan LSVT-LOUD daily  homework   Consulted and Agree with Plan of Care Patient      Patient will benefit from skilled therapeutic intervention in order to improve the following deficits and impairments:   Dysphonia - Plan: SLP plan of care cert/re-cert      G-Codes - [id number] 1313    Functional Assessment Tool Used Perceptual Voice Evaluation   Functional Limitations Voice   Voice Current Status (G9171) At least 40 percent but less than 60 percent impaired, limited or restricted   Voice Goal Status [id number]) At least 1 percent but less than 20 percent impaired, limited or restricted      Problem List Patient Active Problem List   Diagnosis Date Noted   Hyponatremia 12/31/2015    Lou Miner 06/14/2016, 1:18 PM  [place]  MAIN [facility] SERVICES [place], [place], [address] Phone: [phone]   Fax:  [phone]  Name: Stephanie [name] MRN: [id number] Date of Birth: [birth year]

## 2016-06-14 NOTE — Addendum Note (Signed)
Addended by: Alanson Puls on: 06/14/2016 11:09 AM   Modules accepted: Orders

## 2016-06-21 ENCOUNTER — Ambulatory Visit: Payer: Medicare Other | Admitting: Physical Therapy

## 2016-06-21 ENCOUNTER — Encounter: Payer: Self-pay | Admitting: Physical Therapy

## 2016-06-21 ENCOUNTER — Ambulatory Visit: Payer: Medicare Other | Admitting: Speech Pathology

## 2016-06-21 ENCOUNTER — Encounter: Payer: Self-pay | Admitting: Speech Pathology

## 2016-06-21 DIAGNOSIS — R490 Dysphonia: Secondary | ICD-10-CM | POA: Diagnosis not present

## 2016-06-21 DIAGNOSIS — R262 Difficulty in walking, not elsewhere classified: Secondary | ICD-10-CM

## 2016-06-21 NOTE — Therapy (Signed)
[place] MAIN [facility] SERVICES [address] [place], [place], [address] Phone: [phone]   Fax:  [phone]  Physical Therapy Treatment  Patient Details  Name: Stephanie [name] MRN: [id number] Date of Birth: 11-04-1945 Referring Provider: Vladimir Crofts   Encounter Date: 06/21/2016      PT End of Session - 06/21/16 1015    Visit Number 2   Number of Visits 17   Date for PT Re-Evaluation 2016-08-18   Authorization Type g codes   Authorization Time Period 2/10   PT Start Time 1000   PT Stop Time 1100   PT Time Calculation (min) 60 min   Activity Tolerance Patient tolerated treatment well;Patient limited by pain;Patient limited by fatigue   Behavior During Therapy [facility] for tasks assessed/performed      Past Medical History:  Diagnosis Date  . Anemia   . Arthritis   . Chronic back pain   . Diabetes mellitus without complication ([place])   . Dysrhythmia   . GERD (gastroesophageal reflux disease)   . Hyperlipidemia   . Hypertension   . Hypothyroid   . Sleep apnea   . Syncope   . Tremors of nervous system     Past Surgical History:  Procedure Laterality Date  . ABDOMINAL HYSTERECTOMY    . BACK SURGERY  09/16  . CARDIAC CATHETERIZATION    . CATARACT EXTRACTION W/PHACO Right 11/24/2015   Procedure: CATARACT EXTRACTION PHACO AND INTRAOCULAR LENS PLACEMENT (IOC);  Surgeon: Estill Cotta, MD;  Location: [facility] ORS;  Service: Ophthalmology;  Laterality: Right;  [place] 01:14 AP% 25.5 CDE 30.69 fluid pack lot # [id number]  . COLONOSCOPY    . OOPHORECTOMY      There were no vitals filed for this visit.      Subjective Assessment - 06/21/16 1013    Subjective Patient is ready to begin her LSVT BIG exercises today.    Limitations Walking   How long can you sit comfortably? yes   How long can you stand comfortably? 10 mintues   How long can you walk comfortably? a few minutes   Patient Stated Goals to be able to walk longer   Currently in Pain? Yes   Pain Score 6    Pain Location Back   Pain Orientation Right   Pain Descriptors / Indicators Aching   Pain Onset Today      Patient seen for LSVT Daily Session Maximal Daily Exercises for facilitation/coordination of movement Sustained movements are designed to rescale the amplitude of movement output for generalization to daily functional activities .Performed as follows for 1 set of 10 repetitions each multidirectional sustained movements 1) Floor to ceiling 2) Side to side multidirectional Repetitive movements performed in standing and are designed to provide retraining effort needed for sustained muscle activation in tasks   3) Step and reach forward 4) Step and reach backwards 5) Step and reach sideways 6) Rock and reach forward/backward 7) Rock and reach sideways Sit to stand functional component task with supervision 10 reps  Sorting pegs into a peg board left and right Turning small shapes into board L and R hand Sitting in a chair and scooting up to a table x 5 Walking with big arm swing x 1000 feet Patient needs occasional verbal cueing to improve posture and cueing to correctly perform exercises slowly, holding at end of range to increase motor firing of desired muscle to encourage fatigue.  PT Education - 06/21/16 1014    Education provided Yes   Education Details LSVT BIG   Person(s) Educated Patient   Methods Explanation   Comprehension Verbalized understanding             PT Long Term Goals - 06/14/16 1031      PT LONG TERM GOAL #1   Title Patient will be independent in home exercise program to improve strength/mobility for better functional independence with ADLs.   Time 4   Period Weeks   Status New     PT LONG TERM GOAL #2   Title Patient ( 70 years old) will complete five times sit to stand test in < 15 seconds indicating an increased LE strength and improved balance.   Time 4    Period Weeks   Status New     PT LONG TERM GOAL #3   Title Patient will increase six minute walk test distance to >1000 for progression to community ambulator and improve gait ability   Time 4   Period Weeks   Status New     PT LONG TERM GOAL #4   Title Patient will increase 10 meter walk test to >1.76m/s as to improve gait speed for better community ambulation and to reduce fall risk.   Time 4   Period Weeks   Status New               Plan - 06/21/16 1015    Clinical Impression Statement Min cueing needed to appropriately perform LSVT tasks with leg, hand, and head position. Decreased coordination demonstrated requiring consistent verbal cueing to correct form.    Rehab Potential Good   PT Frequency 4x / week   PT Duration 4 weeks   PT Treatment/Interventions Therapeutic exercise;Balance training;Neuromuscular re-education;Therapeutic activities;Gait training   PT Next Visit Plan LSVT BIG   PT Home Exercise Plan LSVT BIG   Consulted and Agree with Plan of Care Patient      Patient will benefit from skilled therapeutic intervention in order to improve the following deficits and impairments:  Abnormal gait, Decreased balance, Decreased mobility, Difficulty walking, Impaired sensation, Obesity, Pain, Impaired flexibility, Decreased strength, Decreased coordination, Decreased activity tolerance  Visit Diagnosis: Difficulty in walking, not elsewhere classified     Problem List Patient Active Problem List   Diagnosis Date Noted  . Hyponatremia 12/31/2015   Alanson Puls, PT, DPT [place], [place] S 06/21/2016, 10:18 AM  [place] MAIN [facility] SERVICES [address] [place], [place], [address] Phone: [phone]   Fax:  [phone]  Name: Stephanie [name] MRN: [id number] Date of Birth: 1946-09-26

## 2016-06-21 NOTE — Therapy (Signed)
[place] MAIN [facility] SERVICES [address] [place], [place], [address] Phone: [phone]   Fax:  [phone]  Speech Language Pathology Treatment  Patient Details  Name: Stephanie [name] MRN: [id number] Date of Birth: April 26, 1946 Referring Provider: Dr. Manuella Ghazi  Encounter Date: 06/21/2016      End of Session - 06/21/16 1250    Visit Number 2   Number of Visits 17   Date for SLP Re-Evaluation 07/16/16   SLP Start Time 0850   SLP Stop Time  0950   SLP Time Calculation (min) 60 min   Activity Tolerance Patient tolerated treatment well      Past Medical History:  Diagnosis Date  . Anemia   . Arthritis   . Chronic back pain   . Diabetes mellitus without complication ([place])   . Dysrhythmia   . GERD (gastroesophageal reflux disease)   . Hyperlipidemia   . Hypertension   . Hypothyroid   . Sleep apnea   . Syncope   . Tremors of nervous system     Past Surgical History:  Procedure Laterality Date  . ABDOMINAL HYSTERECTOMY    . BACK SURGERY  09/16  . CARDIAC CATHETERIZATION    . CATARACT EXTRACTION W/PHACO Right 11/24/2015   Procedure: CATARACT EXTRACTION PHACO AND INTRAOCULAR LENS PLACEMENT (IOC);  Surgeon: Estill Cotta, MD;  Location: [facility] ORS;  Service: Ophthalmology;  Laterality: Right;  [place] 01:14 AP% 25.5 CDE 30.69 fluid pack lot # [id number]  . COLONOSCOPY    . OOPHORECTOMY      There were no vitals filed for this visit.      Subjective Assessment - 06/21/16 1231    Subjective Pt was pleasant and cooperative. Pt agreeable to treatment. Stated, "I have never been a loud talker."   Currently in Pain? No/denies               ADULT SLP TREATMENT - 06/21/16 0001      General Information   Behavior/Cognition Alert;Cooperative;Pleasant mood     Treatment Provided   Treatment provided Cognitive-Linquistic     Pain Assessment   Pain Assessment No/denies pain     Cognitive-Linquistic Treatment   Treatment  focused on Voice;Other (comment)  LSVT LOUD   Skilled Treatment Daily Task #1 (Maximum sustained "ah"): Average 25 seconds, 80 dB. Daily Task 2 (Maximum fundamental frequency range): Highs: 15 high pitched "ah" given mod cues. Lows: 15 low pitched "ah" given mod cues. Daily task #3 (Maximum speech loudness drill of functional phrases): Average 76 dB. Hierarchal speech loudness drill: Words/short phrases 72 dB w/mod cues. Homework: assignments explained and given to pt. Off the cuff remarks: average 66 dB, increasing to 73 dB given min-mod cues.     Assessment / Recommendations / Plan   Plan Continue with current plan of care     Progression Toward Goals   Progression toward goals Progressing toward goals          SLP Education - 06/21/16 1249    Education provided Yes   Education Details LSVT LOUD and LOUD homework   Person(s) Educated Patient   Methods Explanation;Demonstration   Comprehension Verbalized understanding            SLP Long Term Goals - 06/14/16 1311      SLP LONG TERM GOAL #1   Title The patient will complete Daily Tasks (Maximum duration "ah", High/Lows, and Functional Phrases) at average loudness of 80 dB and with loud, good quality voice.  Time 4   Period Weeks   Status New     SLP LONG TERM GOAL #2   Title The patient will complete Hierarchal Speech Loudness reading drills (words/phrases, sentences, and paragraph) at average 75 dB and with loud, good quality voice.     Time 4   Period Weeks   Status New     SLP LONG TERM GOAL #3   Title The patient will participate in conversation, maintaining average loudness of 75 dB and loud, good quality voice.   Time 4   Period Weeks   Status New     SLP LONG TERM GOAL #4   Title The patient will complete homework daily.   Time 4   Period Weeks   Status New          Plan - 06/21/16 1251    Clinical Impression Statement The patient is completing daily tasks and hierarchal speech drill tasks with  loud, good quality voice given min-mod SLP cues. The patient was educated on trying to implement LOUD in conversation.   Speech Therapy Frequency 4x / week   Duration 4 weeks   Treatment/Interventions Other (comment)  LSVT LOUD   Potential to Achieve Goals Good   Potential Considerations Ability to learn/carryover information;Cooperation/participation level;Family/community support   SLP Home Exercise Plan LSVT-LOUD daily homework   Consulted and Agree with Plan of Care Patient      Patient will benefit from skilled therapeutic intervention in order to improve the following deficits and impairments:   Dysphonia    Problem List Patient Active Problem List   Diagnosis Date Noted  . Hyponatremia 12/31/2015    Lee,Yina Riviere 06/21/2016, 12:54 PM  [place] MAIN [facility] SERVICES [address] [place], [place], [address] Phone: [phone]   Fax:  [phone]   Name: Stephanie [name] MRN: [id number] Date of Birth: October 15, 1946

## 2016-06-22 ENCOUNTER — Ambulatory Visit: Payer: Medicare Other | Admitting: Speech Pathology

## 2016-06-22 ENCOUNTER — Ambulatory Visit: Payer: Medicare Other | Admitting: Physical Therapy

## 2016-06-22 ENCOUNTER — Encounter: Payer: Self-pay | Admitting: Speech Pathology

## 2016-06-22 ENCOUNTER — Encounter: Payer: Self-pay | Admitting: Physical Therapy

## 2016-06-22 DIAGNOSIS — R490 Dysphonia: Secondary | ICD-10-CM | POA: Diagnosis not present

## 2016-06-22 DIAGNOSIS — R262 Difficulty in walking, not elsewhere classified: Secondary | ICD-10-CM

## 2016-06-22 NOTE — Therapy (Signed)
[place] MAIN [facility] SERVICES [address] [place], [place], [address] Phone: [phone]   Fax:  [phone]  Physical Therapy Treatment  Patient Details  Name: Stephanie [name] MRN: [id number] Date of Birth: 1945/11/24 Referring Provider: Jennings Books K   Encounter Date: 06/22/2016      PT End of Session - 06/22/16 1010    Visit Number 3   Number of Visits 17   Date for PT Re-Evaluation 07/31/16   Authorization Type g codes   Authorization Time Period 3/10   PT Start Time 1000   PT Stop Time 1100   PT Time Calculation (min) 60 min   Activity Tolerance Patient tolerated treatment well;Patient limited by pain;Patient limited by fatigue   Behavior During Therapy [facility] for tasks assessed/performed      Past Medical History:  Diagnosis Date  . Anemia   . Arthritis   . Chronic back pain   . Diabetes mellitus without complication ([place])   . Dysrhythmia   . GERD (gastroesophageal reflux disease)   . Hyperlipidemia   . Hypertension   . Hypothyroid   . Sleep apnea   . Syncope   . Tremors of nervous system     Past Surgical History:  Procedure Laterality Date  . ABDOMINAL HYSTERECTOMY    . BACK SURGERY  09/16  . CARDIAC CATHETERIZATION    . CATARACT EXTRACTION W/PHACO Right 11/24/2015   Procedure: CATARACT EXTRACTION PHACO AND INTRAOCULAR LENS PLACEMENT (IOC);  Surgeon: Estill Cotta, MD;  Location: [facility] ORS;  Service: Ophthalmology;  Laterality: Right;  [place] 01:14 AP% 25.5 CDE 30.69 fluid pack lot # [id number]  . COLONOSCOPY    . OOPHORECTOMY      There were no vitals filed for this visit.      Subjective Assessment - 06/22/16 1009    Subjective Patient is ready to begin her LSVT BIG exercises today. She performed her HEP last evening.    Limitations Walking   How long can you sit comfortably? yes   How long can you stand comfortably? 10 mintues   How long can you walk comfortably? a few minutes   Patient Stated Goals  to be able to walk longer   Currently in Pain? No/denies   Pain Score 0-No pain   Pain Onset Today   Multiple Pain Sites No        Patient seen for LSVT Daily Session Maximal Daily Exercises for facilitation/coordination of movement Sustained movements are designed to rescale the amplitude of movement output for generalization to daily functional activities .Performed as follows for 1 set of 10 repetitions each multidirectional sustained movements 1) Floor to ceiling 2) Side to side multidirectional Repetitive movements performed in standing and are designed to provide retraining effort needed for sustained muscle activation in tasks   3) Step and reach forward 4) Step and reach backwards 5) Step and reach sideways 6) Rock and reach forward/backward 7) Rock and reach sideways Sit to stand functional component task with supervision 5 reps and simulated activities for: buttoning and fine motor control activities, sitting  in a ch.air and scooting it to the table and away from the table Walking with big arm swing 1000 feet x 2 Patient continues to demonstrates less incoordination of movement with select exercises such as rock and reach and stepping backwards. Patient responds well to verbal and tactile cues to correct form and technique. Patient is able to catch mistakes in technique with incorrect positions and is able remember  the start and finish positions. Motor control of LE much improved.  Muscle fatigue but no major pain complaints.                          PT Education - 06/22/16 1009    Education provided Yes   Education Details LSVT BIG   Person(s) Educated Patient   Methods Explanation   Comprehension Verbalized understanding             PT Long Term Goals - 06/14/16 1031      PT LONG TERM GOAL #1   Title Patient will be independent in home exercise program to improve strength/mobility for better functional independence with ADLs.   Time 4   Period Weeks    Status New     PT LONG TERM GOAL #2   Title Patient (> 3 years old) will complete five times sit to stand test in < 15 seconds indicating an increased LE strength and improved balance.   Time 4   Period Weeks   Status New     PT LONG TERM GOAL #3   Title Patient will increase six minute walk test distance to >1000 for progression to community ambulator and improve gait ability   Time 4   Period Weeks   Status New     PT LONG TERM GOAL #4   Title Patient will increase 10 meter walk test to >1.13m/s as to improve gait speed for better community ambulation and to reduce fall risk.   Time 4   Period Weeks   Status New               Plan - 06/22/16 1011    Clinical Impression Statement Patient continues to demonstrates less incoordination of movement with select exercises such as rock and reach and stepping backwards. Patient responds well to verbal and tactile cues to correct form and technique.    Rehab Potential Good   PT Frequency 4x / week   PT Duration 4 weeks   PT Treatment/Interventions Therapeutic exercise;Balance training;Neuromuscular re-education;Therapeutic activities;Gait training   PT Next Visit Plan LSVT BIG   PT Home Exercise Plan LSVT BIG   Consulted and Agree with Plan of Care Patient      Patient will benefit from skilled therapeutic intervention in order to improve the following deficits and impairments:  Abnormal gait, Decreased balance, Decreased mobility, Difficulty walking, Impaired sensation, Obesity, Pain, Impaired flexibility, Decreased strength, Decreased coordination, Decreased activity tolerance  Visit Diagnosis: Difficulty in walking, not elsewhere classified     Problem List Patient Active Problem List   Diagnosis Date Noted  . Hyponatremia 12/31/2015  Alanson Puls, PT, DPT  [place], [place] S 06/22/2016, 10:12 AM  [place] MAIN [facility] SERVICES [address] [place], [place],  [address] Phone: [phone]   Fax:  [phone]  Name: Stephanie [name] MRN: [id number] Date of Birth: February 09, 1946

## 2016-06-22 NOTE — Therapy (Signed)
[place] MAIN [facility] SERVICES [address] [place], [place], [address] Phone: [phone]   Fax:  [phone]  Speech Language Pathology Treatment  Patient Details  Name: Stephanie [name] MRN: [id number] Date of Birth: 11-Jan-1946 Referring Provider: Dr. Manuella Ghazi  Encounter Date: 06/22/2016      End of Session - 06/22/16 1232    Visit Number 3   Number of Visits 17   Date for SLP Re-Evaluation 07/16/16   SLP Start Time 0850   SLP Stop Time  0945   SLP Time Calculation (min) 55 min   Activity Tolerance Patient tolerated treatment well      Past Medical History:  Diagnosis Date  . Anemia   . Arthritis   . Chronic back pain   . Diabetes mellitus without complication ([place])   . Dysrhythmia   . GERD (gastroesophageal reflux disease)   . Hyperlipidemia   . Hypertension   . Hypothyroid   . Sleep apnea   . Syncope   . Tremors of nervous system     Past Surgical History:  Procedure Laterality Date  . ABDOMINAL HYSTERECTOMY    . BACK SURGERY  09/16  . CARDIAC CATHETERIZATION    . CATARACT EXTRACTION W/PHACO Right 11/24/2015   Procedure: CATARACT EXTRACTION PHACO AND INTRAOCULAR LENS PLACEMENT (IOC);  Surgeon: Estill Cotta, MD;  Location: [facility] ORS;  Service: Ophthalmology;  Laterality: Right;  [place] 01:14 AP% 25.5 CDE 30.69 fluid pack lot # [id number]  . COLONOSCOPY    . OOPHORECTOMY      There were no vitals filed for this visit.      Subjective Assessment - 06/22/16 1231    Subjective "I practiced some."   Currently in Pain? No/denies               ADULT SLP TREATMENT - 06/22/16 0001      General Information   Behavior/Cognition Alert;Pleasant mood;Cooperative     Treatment Provided   Treatment provided Cognitive-Linquistic     Pain Assessment   Pain Assessment No/denies pain     Cognitive-Linquistic Treatment   Treatment focused on Voice   Skilled Treatment Daily Task #1 (Maximum sustained "ah"): Average  24 seconds, 82 dB. Daily Task 2 (Maximum fundamental frequency range): Highs: 15 high pitched "ah" given min-mod cues. Lows: 15 low pitched "ah" given min-mod cues. Daily task #3 (Maximum speech loudness drill of functional phrases): Average 74 dB. Hierarchal speech loudness drill: Reading Words/short phrases 73 dB w/min cues. Generating words and short phrases through linguistic task - 74 dB w/mod cues. Homework: assignments completed. Off the cuff remarks: average 71 dB, w/min verbal cues.     Assessment / Recommendations / Plan   Plan Continue with current plan of care     Progression Toward Goals   Progression toward goals Progressing toward goals          SLP Education - 06/22/16 1232    Education provided Yes   Education Details LSVT LOUD   Person(s) Educated Patient   Methods Explanation;Demonstration   Comprehension Verbalized understanding;Returned demonstration            SLP Long Term Goals - 06/14/16 1311      SLP LONG TERM GOAL #1   Title The patient will complete Daily Tasks (Maximum duration "ah", High/Lows, and Functional Phrases) at average loudness of 80 dB and with loud, good quality voice.    Time 4   Period Weeks   Status New  SLP LONG TERM GOAL #2   Title The patient will complete Hierarchal Speech Loudness reading drills (words/phrases, sentences, and paragraph) at average 75 dB and with loud, good quality voice.     Time 4   Period Weeks   Status New     SLP LONG TERM GOAL #3   Title The patient will participate in conversation, maintaining average loudness of 75 dB and loud, good quality voice.   Time 4   Period Weeks   Status New     SLP LONG TERM GOAL #4   Title The patient will complete homework daily.   Time 4   Period Weeks   Status New          Plan - 06/22/16 1232    Clinical Impression Statement The patient is completing daily tasks and hierarchal speech drill tasks with loud, good quality voice given min-mod SLP cues. The  patient is beginning to try to implement LOUD in conversation.   Speech Therapy Frequency 4x / week   Duration 4 weeks   Treatment/Interventions Other (comment)  LOUD   Potential to Achieve Goals Good   Potential Considerations Ability to learn/carryover information;Cooperation/participation level;Family/community support   SLP Home Exercise Plan LSVT-LOUD daily homework   Consulted and Agree with Plan of Care Patient      Patient will benefit from skilled therapeutic intervention in order to improve the following deficits and impairments:   Dysphonia    Problem List Patient Active Problem List   Diagnosis Date Noted  . Hyponatremia 12/31/2015    [place],Caine [place] 06/22/2016, 12:34 PM  [place] MAIN [facility] SERVICES [address] [place], [place], [address] Phone: [phone]   Fax:  [phone]   Name: Stephanie [name] MRN: [id number] Date of Birth: November 23, 1945

## 2016-06-23 ENCOUNTER — Ambulatory Visit: Payer: Medicare Other | Admitting: Speech Pathology

## 2016-06-23 ENCOUNTER — Ambulatory Visit: Payer: Medicare Other | Admitting: Physical Therapy

## 2016-06-23 ENCOUNTER — Encounter: Payer: Self-pay | Admitting: Physical Therapy

## 2016-06-23 DIAGNOSIS — R490 Dysphonia: Secondary | ICD-10-CM | POA: Diagnosis not present

## 2016-06-23 DIAGNOSIS — R262 Difficulty in walking, not elsewhere classified: Secondary | ICD-10-CM

## 2016-06-23 NOTE — Therapy (Signed)
[place] MAIN [facility] SERVICES [address] [place], [place], [address] Phone: [phone]   Fax:  [phone]  Physical Therapy Treatment  Patient Details  Name: Stephanie [name] MRN: [id number] Date of Birth: [birth year] Referring Provider: Vladimir Crofts   Encounter Date: 06/23/2016      PT End of Session - 06/23/16 1015    Visit Number 4   Number of Visits 17   Date for PT Re-Evaluation 08/10/16   Authorization Type g codes   Authorization Time Period 4/10   PT Start Time 1000   PT Stop Time 1100   PT Time Calculation (min) 60 min   Activity Tolerance Patient tolerated treatment well;Patient limited by pain;Patient limited by fatigue   Behavior During Therapy [facility] for tasks assessed/performed      Past Medical History:  Diagnosis Date  . Anemia   . Arthritis   . Chronic back pain   . Diabetes mellitus without complication ([place])   . Dysrhythmia   . GERD (gastroesophageal reflux disease)   . Hyperlipidemia   . Hypertension   . Hypothyroid   . Sleep apnea   . Syncope   . Tremors of nervous system     Past Surgical History:  Procedure Laterality Date  . ABDOMINAL HYSTERECTOMY    . BACK SURGERY  09/16  . CARDIAC CATHETERIZATION    . CATARACT EXTRACTION W/PHACO Right 11/24/2015   Procedure: CATARACT EXTRACTION PHACO AND INTRAOCULAR LENS PLACEMENT (IOC);  Surgeon: Estill Cotta, MD;  Location: [facility] ORS;  Service: Ophthalmology;  Laterality: Right;  [place] 01:14 AP% 25.5 CDE 30.69 fluid pack lot # [id number]  . COLONOSCOPY    . OOPHORECTOMY      There were no vitals filed for this visit.      Subjective Assessment - 06/23/16 1014    Subjective Patient is ready to begin her LSVT BIG exercises today. She performed her HEP last evening. Her back is a little sore 6/10.   Limitations Walking   How long can you sit comfortably? yes   How long can you stand comfortably? 10 mintues   How long can you walk comfortably? a few  minutes   Patient Stated Goals to be able to walk longer   Currently in Pain? Yes   Pain Score 6    Pain Location Back   Pain Descriptors / Indicators Aching   Pain Type Chronic pain   Pain Onset Today   Pain Frequency Intermittent      Patient seen for LSVT Daily Session Maximal Daily Exercises for facilitation/coordination of movement Sustained movements are designed to rescale the amplitude of movement output for generalization to daily functional activities .Performed as follows for 1 set of 10 repetitions each multidirectional sustained movements 1) Floor to ceiling 2) Side to side multidirectional Repetitive movements performed in standing and are designed to provide retraining effort needed for sustained muscle activation in tasks   3) Step and reach forward 4) Step and reach backwards 5) Step and reach sideways 6) Rock and reach forward/backward 7) Rock and reach sideways Sit to stand functional component task with supervision 5 reps and simulated activities for:  Buttons and fine motor control, and scooting up towards a table and scooting chair back to stand up , reaching down to put on a shoe or sock , stepping up steps without a railing. Walking with Big swing and big steps 1000 feet x 3.   Patient continues to demonstrates less incoordination of movement  with select exercises such as rock and reach and stepping backwards. Patient responds well to verbal and tactile cues to correct form and technique. Patient is able to catch mistakes in technique with incorrect positions and is able remember the start and finish positions. Motor control of LE much improved.  Muscle fatigue but no major pain complaints.                           PT Education - 06/23/16 1015    Education provided Yes   Education Details LSVT BIG   Person(s) Educated Patient   Methods Explanation   Comprehension Verbalized understanding             PT Long Term Goals - 06/14/16 1031       PT LONG TERM GOAL #1   Title Patient will be independent in home exercise program to improve strength/mobility for better functional independence with ADLs.   Time 4   Period Weeks   Status New     PT LONG TERM GOAL #2   Title Patient (> 41 years old) will complete five times sit to stand test in < 15 seconds indicating an increased LE strength and improved balance.   Time 4   Period Weeks   Status New     PT LONG TERM GOAL #3   Title Patient will increase six minute walk test distance to >1000 for progression to community ambulator and improve gait ability   Time 4   Period Weeks   Status New     PT LONG TERM GOAL #4   Title Patient will increase 10 meter walk test to >1.65m/s as to improve gait speed for better community ambulation and to reduce fall risk.   Time 4   Period Weeks   Status New               Plan - 06/23/16 1018    Clinical Impression Statement Patient responds well to verbal and tactile cues to correct form and technique.  CGA to SBA for safety with activities.  Uses to increase intensity and amplitude of movements throughout session   Rehab Potential Good   PT Frequency 4x / week   PT Duration 4 weeks   PT Treatment/Interventions Therapeutic exercise;Balance training;Neuromuscular re-education;Therapeutic activities;Gait training   PT Next Visit Plan LSVT BIG   PT Home Exercise Plan LSVT BIG   Consulted and Agree with Plan of Care Patient      Patient will benefit from skilled therapeutic intervention in order to improve the following deficits and impairments:  Abnormal gait, Decreased balance, Decreased mobility, Difficulty walking, Impaired sensation, Obesity, Pain, Impaired flexibility, Decreased strength, Decreased coordination, Decreased activity tolerance  Visit Diagnosis: Difficulty in walking, not elsewhere classified     Problem List Patient Active Problem List   Diagnosis Date Noted  . Hyponatremia 12/31/2015   Alanson Puls, PT, DPT [place], [place] S 06/23/2016, 10:20 AM  [place] [facility] MAIN [facility] SERVICES [address] [place], [place], [address] Phone: [phone]   Fax:  [phone]  Name: Stephanie [name] MRN: [id number] Date of Birth: May 08, 1946

## 2016-06-24 ENCOUNTER — Encounter: Payer: Self-pay | Admitting: Physical Therapy

## 2016-06-24 ENCOUNTER — Encounter: Payer: Self-pay | Admitting: Speech Pathology

## 2016-06-24 ENCOUNTER — Ambulatory Visit: Payer: Medicare Other | Admitting: Speech Pathology

## 2016-06-24 ENCOUNTER — Ambulatory Visit: Payer: Medicare Other | Admitting: Physical Therapy

## 2016-06-24 DIAGNOSIS — R262 Difficulty in walking, not elsewhere classified: Secondary | ICD-10-CM

## 2016-06-24 DIAGNOSIS — R490 Dysphonia: Secondary | ICD-10-CM

## 2016-06-24 NOTE — Therapy (Signed)
[place] MAIN [facility] SERVICES [address] [place], [place], [address] Phone: [phone]   Fax:  [phone]  Speech Language Pathology Treatment  Patient Details  Name: Stephanie [name] MRN: [id number] Date of Birth: [birth year] Referring Provider: Dr. Manuella Ghazi  Encounter Date: 06/23/2016      End of Session - 06/24/16 1011    Visit Number 4   Number of Visits 17   Date for SLP Re-Evaluation 07/16/16   SLP Start Time 0900   SLP Stop Time  1000   SLP Time Calculation (min) 60 min      Past Medical History:  Diagnosis Date  . Anemia   . Arthritis   . Chronic back pain   . Diabetes mellitus without complication ([place])   . Dysrhythmia   . GERD (gastroesophageal reflux disease)   . Hyperlipidemia   . Hypertension   . Hypothyroid   . Sleep apnea   . Syncope   . Tremors of nervous system     Past Surgical History:  Procedure Laterality Date  . ABDOMINAL HYSTERECTOMY    . BACK SURGERY  09/16  . CARDIAC CATHETERIZATION    . CATARACT EXTRACTION W/PHACO Right 11/24/2015   Procedure: CATARACT EXTRACTION PHACO AND INTRAOCULAR LENS PLACEMENT (IOC);  Surgeon: Estill Cotta, MD;  Location: [facility] ORS;  Service: Ophthalmology;  Laterality: Right;  [place] 01:14 AP% 25.5 CDE 30.69 fluid pack lot # [id number]  . COLONOSCOPY    . OOPHORECTOMY      There were no vitals filed for this visit.      Subjective Assessment - 06/24/16 1010    Subjective "I was loud enough to startle the cat"   Currently in Pain? No/denies               ADULT SLP TREATMENT - 06/24/16 0001      General Information   Behavior/Cognition Alert;Pleasant mood;Cooperative     Treatment Provided   Treatment provided Cognitive-Linquistic     Pain Assessment   Pain Assessment No/denies pain     Cognitive-Linquistic Treatment   Treatment focused on Voice   Skilled Treatment Daily Task #1 (Maximum sustained "ah"): Average 17 seconds, 85 dB. Daily Task 2  (Maximum fundamental frequency range): Highs: 15 high pitched "ah" given mod cues. Lows: 15 low pitched "ah" given min-mod cues. Daily task #3 (Maximum speech loudness drill of functional phrases): Average 74 dB.  Hierarchal speech loudness drill: Read phrases/sentences, 73 dB. Generate sentences given simple linguistic task, 73 dB. Homework: assignments completed.  Off the cuff remarks: average 71 dB.     Assessment / Recommendations / Plan   Plan Continue with current plan of care     Progression Toward Goals   Progression toward goals Progressing toward goals          SLP Education - 06/24/16 1010    Education provided Yes   Education Details LSVT-LOUD   Person(s) Educated Patient   Methods Explanation   Comprehension Verbalized understanding            SLP Long Term Goals - 06/14/16 1311      SLP LONG TERM GOAL #1   Title The patient will complete Daily Tasks (Maximum duration "ah", High/Lows, and Functional Phrases) at average loudness of 80 dB and with loud, good quality voice.    Time 4   Period Weeks   Status New     SLP LONG TERM GOAL #2   Title The patient will complete Hierarchal  Speech Loudness reading drills (words/phrases, sentences, and paragraph) at average 75 dB and with loud, good quality voice.     Time 4   Period Weeks   Status New     SLP LONG TERM GOAL #3   Title The patient will participate in conversation, maintaining average loudness of 75 dB and loud, good quality voice.   Time 4   Period Weeks   Status New     SLP LONG TERM GOAL #4   Title The patient will complete homework daily.   Time 4   Period Weeks   Status New          Plan - 06/24/16 1011    Clinical Impression Statement The patient is completing daily tasks and hierarchal speech drill tasks with louder, better quality voice given min SLP cues.  She is consciously trying to incorporate LOUD into conversation.   Speech Therapy Frequency 4x / week   Duration 4 weeks    Treatment/Interventions Other (comment)  LSVT-LOUD   Potential to Achieve Goals Good   Potential Considerations Ability to learn/carryover information;Cooperation/participation level;Family/community support   SLP Home Exercise Plan LSVT-LOUD daily homework   Consulted and Agree with Plan of Care Patient      Patient will benefit from skilled therapeutic intervention in order to improve the following deficits and impairments:   Dysphonia    Problem List Patient Active Problem List   Diagnosis Date Noted  . Hyponatremia 12/31/2015   [name], MS/CCC- SLP  Lou Miner 06/24/2016, 10:12 AM  [place] MAIN [facility] SERVICES [address] [place], [place], [address] Phone: [phone]   Fax:  [phone]   Name: LINDALEE BOATMAN MRN: [id number] Date of Birth: Apr 15, 1946

## 2016-06-24 NOTE — Therapy (Signed)
[place] MAIN [facility] SERVICES [address] [place], [place], [address] Phone: [phone]   Fax:  [phone]  Speech Language Pathology Treatment  Patient Details  Name: Stephanie [name] MRN: [id number] Date of Birth: 1946-06-03 Referring Provider: Dr. Manuella Ghazi  Encounter Date: 06/24/2016      End of Session - 06/24/16 1015    Visit Number 5   Number of Visits 17   Date for SLP Re-Evaluation 07/16/16   SLP Start Time 0845   SLP Stop Time  0930   SLP Time Calculation (min) 45 min   Activity Tolerance Patient tolerated treatment well      Past Medical History:  Diagnosis Date  . Anemia   . Arthritis   . Chronic back pain   . Diabetes mellitus without complication ([place])   . Dysrhythmia   . GERD (gastroesophageal reflux disease)   . Hyperlipidemia   . Hypertension   . Hypothyroid   . Sleep apnea   . Syncope   . Tremors of nervous system     Past Surgical History:  Procedure Laterality Date  . ABDOMINAL HYSTERECTOMY    . BACK SURGERY  09/16  . CARDIAC CATHETERIZATION    . CATARACT EXTRACTION W/PHACO Right 11/24/2015   Procedure: CATARACT EXTRACTION PHACO AND INTRAOCULAR LENS PLACEMENT (IOC);  Surgeon: Estill Cotta, MD;  Location: [facility] ORS;  Service: Ophthalmology;  Laterality: Right;  [place] 01:14 AP% 25.5 CDE 30.69 fluid pack lot # [id number]  . COLONOSCOPY    . OOPHORECTOMY      There were no vitals filed for this visit.      Subjective Assessment - 06/24/16 1015    Subjective "I'm trying to be loud!"   Currently in Pain? No/denies               ADULT SLP TREATMENT - 06/24/16 1013      General Information   Behavior/Cognition Alert;Pleasant mood;Cooperative     Treatment Provided   Treatment provided Cognitive-Linquistic     Pain Assessment   Pain Assessment No/denies pain     Cognitive-Linquistic Treatment   Treatment focused on Voice   Skilled Treatment Daily Task #1 (Maximum sustained "ah"):  Average 17 seconds, 85 dB. Daily Task 2 (Maximum fundamental frequency range): Highs: 15 high pitched "ah" given mod cues. Lows: 15 low pitched "ah" given min-mod cues. Daily task #3 (Maximum speech loudness drill of functional phrases): Average 75 dB.  Hierarchal speech loudness drill: Read paragraphs, 74 dB. Homework: assignments completed.  Off the cuff remarks: average 71 dB.     Assessment / Recommendations / Plan   Plan Continue with current plan of care     Progression Toward Goals   Progression toward goals Progressing toward goals          SLP Education - 06/24/16 1015    Education provided Yes   Education Details LSVT-LOUD   Person(s) Educated Patient   Methods Explanation   Comprehension Verbalized understanding            SLP Long Term Goals - 06/14/16 1311      SLP LONG TERM GOAL #1   Title The patient will complete Daily Tasks (Maximum duration "ah", High/Lows, and Functional Phrases) at average loudness of 80 dB and with loud, good quality voice.    Time 4   Period Weeks   Status New     SLP LONG TERM GOAL #2   Title The patient will complete Hierarchal Speech Loudness reading  drills (words/phrases, sentences, and paragraph) at average 75 dB and with loud, good quality voice.     Time 4   Period Weeks   Status New     SLP LONG TERM GOAL #3   Title The patient will participate in conversation, maintaining average loudness of 75 dB and loud, good quality voice.   Time 4   Period Weeks   Status New     SLP LONG TERM GOAL #4   Title The patient will complete homework daily.   Time 4   Period Weeks   Status New          Plan - 06/24/16 1016    Clinical Impression Statement The patient is completing daily tasks and hierarchal speech drill tasks with louder, better quality voice given min SLP cues.  She is consciously trying to incorporate LOUD into conversation.   Speech Therapy Frequency 4x / week   Duration 4 weeks   Treatment/Interventions Other  (comment)  LSVT-LOUD   Potential to Achieve Goals Good   Potential Considerations Ability to learn/carryover information;Cooperation/participation level;Family/community support   SLP Home Exercise Plan LSVT-LOUD daily homework   Consulted and Agree with Plan of Care Patient      Patient will benefit from skilled therapeutic intervention in order to improve the following deficits and impairments:   Dysphonia    Problem List Patient Active Problem List   Diagnosis Date Noted  . Hyponatremia 12/31/2015    [name], MS/CCC- SLP  Lou Miner 06/24/2016, 10:17 AM  [place] MAIN [facility] SERVICES [address] [place], [place], [address] Phone: [phone]   Fax:  [phone]   Name: Stephanie [name] MRN: [id number] Date of Birth: 11/29/1945

## 2016-06-24 NOTE — Therapy (Signed)
[place] MAIN [facility] SERVICES [address] [place], [place], [address] Phone: [phone]   Fax:  [phone]  Physical Therapy Treatment  Patient Details  Name: Stephanie [name] MRN: [id number] Date of Birth: [birth year] Referring Provider: Jennings Books K   Encounter Date: 06/24/2016      PT End of Session - 06/24/16 1009    Visit Number 5   Number of Visits 17   Date for PT Re-Evaluation 07-24-16   Authorization Type g codes   Authorization Time Period 5/10   PT Start Time 1000   PT Stop Time 1100   PT Time Calculation (min) 60 min   Equipment Utilized During Treatment Gait belt   Activity Tolerance Patient tolerated treatment well;Patient limited by pain;Patient limited by fatigue   Behavior During Therapy [facility] for tasks assessed/performed      Past Medical History:  Diagnosis Date  . Anemia   . Arthritis   . Chronic back pain   . Diabetes mellitus without complication ([place])   . Dysrhythmia   . GERD (gastroesophageal reflux disease)   . Hyperlipidemia   . Hypertension   . Hypothyroid   . Sleep apnea   . Syncope   . Tremors of nervous system     Past Surgical History:  Procedure Laterality Date  . ABDOMINAL HYSTERECTOMY    . BACK SURGERY  09/16  . CARDIAC CATHETERIZATION    . CATARACT EXTRACTION W/PHACO Right 11/24/2015   Procedure: CATARACT EXTRACTION PHACO AND INTRAOCULAR LENS PLACEMENT (IOC);  Surgeon: Estill Cotta, MD;  Location: [facility] ORS;  Service: Ophthalmology;  Laterality: Right;  [place] 01:14 AP% 25.5 CDE 30.69 fluid pack lot # [id number]  . COLONOSCOPY    . OOPHORECTOMY      There were no vitals filed for this visit.      Subjective Assessment - 06/24/16 1007    Subjective Patient is ready to begin her LSVT BIG exercises today. She performed her HEP last evening. Her back is a little sore 6/10.   Limitations Walking   How long can you sit comfortably? yes   How long can you stand comfortably? 10  mintues   How long can you walk comfortably? a few minutes   Patient Stated Goals to be able to walk longer   Pain Score 6    Pain Location Back   Pain Descriptors / Indicators Aching   Pain Type Chronic pain   Pain Onset Today   Pain Frequency Intermittent   Multiple Pain Sites No         Patient seen for LSVT Daily Session Maximal Daily Exercises for facilitation/coordination of movement Sustained movements are designed to rescale the amplitude of movement output for generalization to daily functional activities .Performed as follows for 1 set of 10 repetitions each multidirectional sustained movements 1) Floor to ceiling 2) Side to side multidirectional Repetitive movements performed in standing and are designed to provide retraining effort needed for sustained muscle activation in tasks   3) Step and reach forward 4) Step and reach backwards 5) Step and reach sideways 6) Rock and reach forward/backward 7) Rock and reach sideways Sit to stand functional component task with supervision 5 reps and simulated activities for: fine motor control with pegs and dealing car for buttons and doing her hair, scooting fwd in chair and scooting bwds from the table with her chair, ascending and descending steps without railing Min cueing needed to appropriately perform LSVT tasks with leg, hand, and head  position. Decreased coordination demonstrated requiring consistent verbal cueing to correct form. Cognitive understanding of task was delayed. Patient continues to demonstrate some in coordination of movement with select exercises such as rock and reach and stepping backwards. Patient responds well to verbal and tactile cues to correct form and technique.  CGA to SBA for safety with activities.  Uses to increase intensity and amplitude of movements throughout session                         PT Education - 06/24/16 1008    Education provided Yes   Education Details LSVT BIG   Person(s)  Educated Patient   Methods Explanation   Comprehension Verbalized understanding             PT Long Term Goals - 06/14/16 1031      PT LONG TERM GOAL #1   Title Patient will be independent in home exercise program to improve strength/mobility for better functional independence with ADLs.   Time 4   Period Weeks   Status New     PT LONG TERM GOAL #2   Title Patient (> 50 years old) will complete five times sit to stand test in < 15 seconds indicating an increased LE strength and improved balance.   Time 4   Period Weeks   Status New     PT LONG TERM GOAL #3   Title Patient will increase six minute walk test distance to >1000 for progression to community ambulator and improve gait ability   Time 4   Period Weeks   Status New     PT LONG TERM GOAL #4   Title Patient will increase 10 meter walk test to >1.30m/s as to improve gait speed for better community ambulation and to reduce fall risk.   Time 4   Period Weeks   Status New               Plan - 06/24/16 1012    Clinical Impression Statement Patient responds well to verbal and tactile cues to correct form and technique.  CGA to SBA for safety with activities.     Rehab Potential Good   PT Frequency 4x / week   PT Duration 4 weeks   PT Treatment/Interventions Therapeutic exercise;Balance training;Neuromuscular re-education;Therapeutic activities;Gait training   PT Next Visit Plan LSVT BIG   PT Home Exercise Plan LSVT BIG   Consulted and Agree with Plan of Care Patient      Patient will benefit from skilled therapeutic intervention in order to improve the following deficits and impairments:  Abnormal gait, Decreased balance, Decreased mobility, Difficulty walking, Impaired sensation, Obesity, Pain, Impaired flexibility, Decreased strength, Decreased coordination, Decreased activity tolerance  Visit Diagnosis: Difficulty in walking, not elsewhere classified     Problem List Patient Active Problem List    Diagnosis Date Noted  . Hyponatremia 12/31/2015   Alanson Puls, PT, DPT [place], [place] S 06/24/2016, 10:15 AM  [place] MAIN [facility] SERVICES [address] [place], [place], [address] Phone: [phone]   Fax:  [phone]  Name: ALLIENE ASHWORTH MRN: [id number] Date of Birth: [birth year]

## 2016-06-28 ENCOUNTER — Ambulatory Visit: Payer: Medicare Other | Admitting: Physical Therapy

## 2016-06-28 ENCOUNTER — Ambulatory Visit: Payer: Medicare Other | Admitting: Speech Pathology

## 2016-06-28 ENCOUNTER — Encounter: Payer: Self-pay | Admitting: Speech Pathology

## 2016-06-28 ENCOUNTER — Encounter: Payer: Self-pay | Admitting: Physical Therapy

## 2016-06-28 DIAGNOSIS — R490 Dysphonia: Secondary | ICD-10-CM | POA: Diagnosis not present

## 2016-06-28 DIAGNOSIS — R262 Difficulty in walking, not elsewhere classified: Secondary | ICD-10-CM

## 2016-06-28 NOTE — Patient Instructions (Signed)
Written handout LSVT BIG pictures 2 x a day x 10 repetitions x 7 days a week

## 2016-06-28 NOTE — Therapy (Signed)
[place] MAIN [facility] SERVICES [address] [place], [place], [address] Phone: [phone]   Fax:  [phone]  Physical Therapy Treatment  Patient Details  Name: Stephanie [name] MRN: [id number] Date of Birth: 08-17-1946 Referring Provider: Jennings Books K   Encounter Date: 06/28/2016      PT End of Session - 06/28/16 0955    Visit Number 6   Number of Visits 17   Date for PT Re-Evaluation 08/06/16   Authorization Type g codes   Authorization Time Period 6/10   PT Start Time 1000   PT Stop Time 1100   PT Time Calculation (min) 60 min   Equipment Utilized During Treatment Gait belt   Activity Tolerance Patient tolerated treatment well;Patient limited by pain;Patient limited by fatigue   Behavior During Therapy [facility] for tasks assessed/performed      Past Medical History:  Diagnosis Date  . Anemia   . Arthritis   . Chronic back pain   . Diabetes mellitus without complication ([place])   . Dysrhythmia   . GERD (gastroesophageal reflux disease)   . Hyperlipidemia   . Hypertension   . Hypothyroid   . Sleep apnea   . Syncope   . Tremors of nervous system     Past Surgical History:  Procedure Laterality Date  . ABDOMINAL HYSTERECTOMY    . BACK SURGERY  09/16  . CARDIAC CATHETERIZATION    . CATARACT EXTRACTION W/PHACO Right 11/24/2015   Procedure: CATARACT EXTRACTION PHACO AND INTRAOCULAR LENS PLACEMENT (IOC);  Surgeon: Estill Cotta, MD;  Location: [facility] ORS;  Service: Ophthalmology;  Laterality: Right;  [place] 01:14 AP% 25.5 CDE 30.69 fluid pack lot # [id number]  . COLONOSCOPY    . OOPHORECTOMY      There were no vitals filed for this visit.      Subjective Assessment - 06/28/16 1007    Subjective Patient is ready to begin her LSVT BIG exercises today. She performed her HEP last evening. Her back is a little sore 6/10.   Limitations Walking   How long can you sit comfortably? yes   How long can you stand comfortably? 10  mintues   How long can you walk comfortably? a few minutes   Patient Stated Goals to be able to walk longer   Pain Onset Today      Patient seen for LSVT Daily Session Maximal Daily Exercises for facilitation/coordination of movement Sustained movements are designed to rescale the amplitude of movement output for generalization to daily functional activities .Performed as follows for 1 set of 10 repetitions each multidirectional sustained movements 1) Floor to ceiling 2) Side to side multidirectional Repetitive movements performed in standing and are designed to provide retraining effort needed for sustained muscle activation in tasks   3) Step and reach forward 4) Step and reach backwards 5) Step and reach sideways 6) Rock and reach forward/backward 7) Rock and reach sideways Sit to stand functional component task with supervision 5 reps and simulated activities for:  improved stepping on steps with ascending steps x 5 Coordination for fine motor control with pegs and card dealing for buttons and handling keys Scooting in and out with a chair at a table Working on reaching activities with LUE for pulling clothing out of the washer and dryer Ambulation with BIg arm swing 1000 feet x 3 Patient continues to demonstrates less incoordination of movement with select exercises such as rock and reach and stepping backwards. Patient responds well to verbal and  tactile cues to correct form and technique. Patient is able to catch mistakes in technique with incorrect positions and is able remember the start and finish positions. Motor control of LE much improved.  Muscle fatigue but no major pain complaints.                           PT Education - 06/28/16 0954    Education provided Yes   Education Details LSVT BIG   Person(s) Educated Patient   Methods Explanation   Comprehension Verbalized understanding             PT Long Term Goals - 06/14/16 1031      PT LONG TERM  GOAL #1   Title Patient will be independent in home exercise program to improve strength/mobility for better functional independence with ADLs.   Time 4   Period Weeks   Status New     PT LONG TERM GOAL #2   Title Patient (> 28 years old) will complete five times sit to stand test in < 15 seconds indicating an increased LE strength and improved balance.   Time 4   Period Weeks   Status New     PT LONG TERM GOAL #3   Title Patient will increase six minute walk test distance to >1000 for progression to community ambulator and improve gait ability   Time 4   Period Weeks   Status New     PT LONG TERM GOAL #4   Title Patient will increase 10 meter walk test to >1.61m/s as to improve gait speed for better community ambulation and to reduce fall risk.   Time 4   Period Weeks   Status New               Plan - 06/28/16 1006    Clinical Impression Statement CGA to SBA for safety with activities.  Cues to increase intensity and amplitude of movements throughout session. Reviewed LBP standing repeated extension exercises for back pain.    Rehab Potential Good   PT Frequency 4x / week   PT Duration 4 weeks   PT Treatment/Interventions Therapeutic exercise;Balance training;Neuromuscular re-education;Therapeutic activities;Gait training   PT Next Visit Plan LSVT BIG   PT Home Exercise Plan LSVT BIG   Consulted and Agree with Plan of Care Patient      Patient will benefit from skilled therapeutic intervention in order to improve the following deficits and impairments:  Abnormal gait, Decreased balance, Decreased mobility, Difficulty walking, Impaired sensation, Obesity, Pain, Impaired flexibility, Decreased strength, Decreased coordination, Decreased activity tolerance  Visit Diagnosis: Difficulty in walking, not elsewhere classified     Problem List Patient Active Problem List   Diagnosis Date Noted  . Hyponatremia 12/31/2015   Alanson Puls, PT, DPT [place],  Minette [place] S 06/28/2016, 10:09 AM  [place] MAIN [facility] SERVICES [address] [place], [place], [address] Phone: [phone]   Fax:  [phone]  Name: Stephanie [name] MRN: [id number] Date of Birth: 1946-04-14

## 2016-06-28 NOTE — Therapy (Signed)
[place] MAIN [facility] SERVICES [address] [place], [place], [address] Phone: [phone]   Fax:  [phone]  Speech Language Pathology Treatment  Patient Details  Name: LILLETTE RISSMILLER MRN: [id number] Date of Birth: 1946/01/19 Referring Provider: Dr. Manuella Ghazi  Encounter Date: 06/28/2016      End of Session - 06/28/16 1236    Visit Number 6   Number of Visits 17   Date for SLP Re-Evaluation 07/16/16   SLP Start Time 0900   SLP Stop Time  1000   SLP Time Calculation (min) 60 min   Activity Tolerance Patient tolerated treatment well      Past Medical History:  Diagnosis Date  . Anemia   . Arthritis   . Chronic back pain   . Diabetes mellitus without complication ([place])   . Dysrhythmia   . GERD (gastroesophageal reflux disease)   . Hyperlipidemia   . Hypertension   . Hypothyroid   . Sleep apnea   . Syncope   . Tremors of nervous system     Past Surgical History:  Procedure Laterality Date  . ABDOMINAL HYSTERECTOMY    . BACK SURGERY  09/16  . CARDIAC CATHETERIZATION    . CATARACT EXTRACTION W/PHACO Right 11/24/2015   Procedure: CATARACT EXTRACTION PHACO AND INTRAOCULAR LENS PLACEMENT (IOC);  Surgeon: Estill Cotta, MD;  Location: [facility] ORS;  Service: Ophthalmology;  Laterality: Right;  [place] 01:14 AP% 25.5 CDE 30.69 fluid pack lot # [id number]  . COLONOSCOPY    . OOPHORECTOMY      There were no vitals filed for this visit.      Subjective Assessment - 06/28/16 1229    Subjective The patient reported that she is trying to remember to use her "loud" voice at home and that at least one person has commented that she is speaking more loudly.    Currently in Pain? No/denies               ADULT SLP TREATMENT - 06/28/16 0001      General Information   Behavior/Cognition Alert;Pleasant mood;Cooperative     Treatment Provided   Treatment provided Cognitive-Linquistic     Pain Assessment   Pain Assessment No/denies  pain     Cognitive-Linquistic Treatment   Treatment focused on Voice   Skilled Treatment Daily Task #1 (Maximum sustained "ah"): Average 17 seconds, 85 dB. Daily Task 2 (Maximum fundamental frequency range): Highs: 15 high pitched "ah" given mod cues. Lows: 15 low pitched "ah" given min-mod cues. Daily task #3 (Maximum speech loudness drill of functional phrases): Average 75 dB.  Hierarchal speech loudness drill: Read paragraphs, 74 dB. Homework: assignments completed.  Off the cuff remarks: average 71 dB.     Assessment / Recommendations / Plan   Plan Continue with current plan of care     Progression Toward Goals   Progression toward goals Progressing toward goals          SLP Education - 06/28/16 1230    Education provided Yes   Education Details Promoted calibration by providing verbal feedback on loudness level and encouraging patient to self-monitor perceived effort to maintain appropriate loudness.   Person(s) Educated Patient   Methods Explanation   Comprehension Verbalized understanding            SLP Long Term Goals - 06/14/16 1311      SLP LONG TERM GOAL #1   Title The patient will complete Daily Tasks (Maximum duration "ah", High/Lows, and Functional Phrases)  at average loudness of 80 dB and with loud, good quality voice.    Time 4   Period Weeks   Status New     SLP LONG TERM GOAL #2   Title The patient will complete Hierarchal Speech Loudness reading drills (words/phrases, sentences, and paragraph) at average 75 dB and with loud, good quality voice.     Time 4   Period Weeks   Status New     SLP LONG TERM GOAL #3   Title The patient will participate in conversation, maintaining average loudness of 75 dB and loud, good quality voice.   Time 4   Period Weeks   Status New     SLP LONG TERM GOAL #4   Title The patient will complete homework daily.   Time 4   Period Weeks   Status New          Plan - 06/28/16 1237    Clinical Impression Statement  The patient is completing daily tasks and hierarchical speech drill task with louder, better quality voice provided minimal SLP cues. She is also incorporating louder, better quality voice into conversation with minimal cues.    Speech Therapy Frequency 4x / week   Duration 4 weeks   Treatment/Interventions --  LSVT-LOUD   Potential to Achieve Goals Good   Potential Considerations Ability to learn/carryover information;Family/community support;Cooperation/participation level   SLP Home Exercise Plan LSVT-LOUD daily homework   Consulted and Agree with Plan of Care Patient      Patient will benefit from skilled therapeutic intervention in order to improve the following deficits and impairments:   Dysphonia    Problem List Patient Active Problem List   Diagnosis Date Noted  . Hyponatremia 12/31/2015    Rickard Rhymes  Graduate Student Clinician 06/28/2016, 12:41 PM  [place] MAIN [facility] SERVICES [address] [place], [place], [address] Phone: [phone]   Fax:  [phone]   Name: ABSALAT NYSTROM MRN: [id number] Date of Birth: 1946-07-31

## 2016-06-29 ENCOUNTER — Encounter: Payer: Self-pay | Admitting: Physical Therapy

## 2016-06-29 ENCOUNTER — Ambulatory Visit: Payer: Medicare Other | Admitting: Physical Therapy

## 2016-06-29 ENCOUNTER — Encounter: Payer: Self-pay | Admitting: Speech Pathology

## 2016-06-29 ENCOUNTER — Ambulatory Visit: Payer: Medicare Other | Admitting: Speech Pathology

## 2016-06-29 DIAGNOSIS — R490 Dysphonia: Secondary | ICD-10-CM

## 2016-06-29 DIAGNOSIS — R262 Difficulty in walking, not elsewhere classified: Secondary | ICD-10-CM

## 2016-06-29 NOTE — Therapy (Signed)
[place] MAIN [facility] SERVICES [address] [place], [place], [address] Phone: [phone]   Fax:  [phone]  Physical Therapy Treatment  Patient Details  Name: Stephanie [name] MRN: [id number] Date of Birth: 16-Sep-1946 Referring Provider: Jennings Books K   Encounter Date: 06/29/2016      PT End of Session - 06/29/16 1007    Visit Number 7   Number of Visits 17   Date for PT Re-Evaluation July 23, 2016   Authorization Type g codes   Authorization Time Period 7/10   PT Start Time 1000   PT Stop Time 1100   PT Time Calculation (min) 60 min   Equipment Utilized During Treatment Gait belt   Activity Tolerance Patient tolerated treatment well;Patient limited by pain;Patient limited by fatigue   Behavior During Therapy [facility] for tasks assessed/performed      Past Medical History:  Diagnosis Date  . Anemia   . Arthritis   . Chronic back pain   . Diabetes mellitus without complication ([place])   . Dysrhythmia   . GERD (gastroesophageal reflux disease)   . Hyperlipidemia   . Hypertension   . Hypothyroid   . Sleep apnea   . Syncope   . Tremors of nervous system     Past Surgical History:  Procedure Laterality Date  . ABDOMINAL HYSTERECTOMY    . BACK SURGERY  09/16  . CARDIAC CATHETERIZATION    . CATARACT EXTRACTION W/PHACO Right 11/24/2015   Procedure: CATARACT EXTRACTION PHACO AND INTRAOCULAR LENS PLACEMENT (IOC);  Surgeon: Estill Cotta, MD;  Location: [facility] ORS;  Service: Ophthalmology;  Laterality: Right;  [place] 01:14 AP% 25.5 CDE 30.69 fluid pack lot # [id number]  . COLONOSCOPY    . OOPHORECTOMY      There were no vitals filed for this visit.      Subjective Assessment - 06/29/16 1005    Subjective Patient is doing her LSVT BIG exercises at home.   How long can you sit comfortably? yes   How long can you stand comfortably? 10 mintues   How long can you walk comfortably? a few minutes   Patient Stated Goals to be able to walk  longer   Currently in Pain? Yes   Pain Score 5    Pain Location Back   Pain Descriptors / Indicators Aching   Pain Type Chronic pain   Pain Onset More than a month ago   Pain Frequency Intermittent   Multiple Pain Sites No      Patient seen for LSVT Daily Session Maximal Daily Exercises for facilitation/coordination of movement Sustained movements are designed to rescale the amplitude of movement output for generalization to daily functional activities .Performed as follows for 1 set of 10 repetitions each multidirectional sustained movements 1) Floor to ceiling 2) Side to side multidirectional Repetitive movements performed in standing and are designed to provide retraining effort needed for sustained muscle activation in tasks  3) Step and reach forward 4) Step and reach backwards 5) Step and reach sideways 6) Rock and reach forward/backward 7) Rock and reach sideways Sit to stand functional component task with supervision 5 reps and simulated activities for:  improved stepping on steps with ascending steps x 5 Coordination for fine motor control with pegs and card dealing for buttons and handling keys Scooting in and out with a chair at a table Working on reaching activities with LUE for pulling clothing out of the washer and dryer Ambulation with BIg arm swing 1000 feet x  3 Patient continues to demonstrates less incoordination of movement with select exercises such as rock and reach and stepping backwards. Patient responds well to verbal and tactile cues to correct form and technique. Patient is able to catch mistakes in technique with incorrect positions and is able remember the start and finish positions. Motor control of LE much improved.  Muscle fatigue but no major pain complaints                           PT Education - 06/29/16 1007    Education Details LSVT BIG    Person(s) Educated Patient   Methods Explanation   Comprehension Verbalized understanding              PT Long Term Goals - 06/14/16 1031      PT LONG TERM GOAL #1   Title Patient will be independent in home exercise program to improve strength/mobility for better functional independence with ADLs.   Time 4   Period Weeks   Status New     PT LONG TERM GOAL #2   Title Patient (> 22 years old) will complete five times sit to stand test in < 15 seconds indicating an increased LE strength and improved balance.   Time 4   Period Weeks   Status New     PT LONG TERM GOAL #3   Title Patient will increase six minute walk test distance to >1000 for progression to community ambulator and improve gait ability   Time 4   Period Weeks   Status New     PT LONG TERM GOAL #4   Title Patient will increase 10 meter walk test to >1.73m/s as to improve gait speed for better community ambulation and to reduce fall risk.   Time 4   Period Weeks   Status New               Plan - 06/29/16 1008    Clinical Impression Statement Patient is performing LSVT BIG exercises wiht cuing for sequencing and has incoordinated stepping patterns with backwards stepping and needs cues to swing her left UE.    Rehab Potential Good   PT Frequency 4x / week   PT Duration 4 weeks   PT Treatment/Interventions Therapeutic exercise;Balance training;Neuromuscular re-education;Therapeutic activities;Gait training   PT Next Visit Plan LSVT BIG   PT Home Exercise Plan LSVT BIG   Consulted and Agree with Plan of Care Patient      Patient will benefit from skilled therapeutic intervention in order to improve the following deficits and impairments:  Abnormal gait, Decreased balance, Decreased mobility, Difficulty walking, Impaired sensation, Obesity, Pain, Impaired flexibility, Decreased strength, Decreased coordination, Decreased activity tolerance  Visit Diagnosis: Difficulty in walking, not elsewhere classified     Problem List Patient Active Problem List   Diagnosis Date Noted  . Hyponatremia  12/31/2015   Alanson Puls, PT, DPT [place], [place] S 06/29/2016, 10:12 AM  [place] MAIN [facility] SERVICES [address] [place], [place], [address] Phone: [phone]   Fax:  [phone]  Name: Stephanie [name] MRN: [id number] Date of Birth: August 30, 1946

## 2016-06-29 NOTE — Therapy (Signed)
[place] MAIN [facility] SERVICES [address] [place], [place], [address] Phone: [phone]   Fax:  [phone]  Speech Language Pathology Treatment  Patient Details  Name: AKAYLIA MATHIES MRN: [id number] Date of Birth: [birth year] Referring Provider: Dr. Manuella Ghazi  Encounter Date: 06/29/2016      End of Session - 06/29/16 1251    Visit Number 7   Number of Visits 17   Date for SLP Re-Evaluation 07/16/16   SLP Start Time 0900   SLP Stop Time  1000   SLP Time Calculation (min) 60 min   Activity Tolerance Patient tolerated treatment well      Past Medical History:  Diagnosis Date  . Anemia   . Arthritis   . Chronic back pain   . Diabetes mellitus without complication ([place])   . Dysrhythmia   . GERD (gastroesophageal reflux disease)   . Hyperlipidemia   . Hypertension   . Hypothyroid   . Sleep apnea   . Syncope   . Tremors of nervous system     Past Surgical History:  Procedure Laterality Date  . ABDOMINAL HYSTERECTOMY    . BACK SURGERY  09/16  . CARDIAC CATHETERIZATION    . CATARACT EXTRACTION W/PHACO Right 11/24/2015   Procedure: CATARACT EXTRACTION PHACO AND INTRAOCULAR LENS PLACEMENT (IOC);  Surgeon: Estill Cotta, MD;  Location: [facility] ORS;  Service: Ophthalmology;  Laterality: Right;  [place] 01:14 AP% 25.5 CDE 30.69 fluid pack lot # [id number]  . COLONOSCOPY    . OOPHORECTOMY      There were no vitals filed for this visit.      Subjective Assessment - 06/29/16 1250    Subjective The patient reported that she is trying to remember to use her "loud" voice at home and that at least one person has commented that she is speaking more loudly.    Currently in Pain? No/denies               ADULT SLP TREATMENT - 06/29/16 0001      General Information   Behavior/Cognition Alert;Pleasant mood;Cooperative     Treatment Provided   Treatment provided Cognitive-Linquistic     Pain Assessment   Pain Assessment No/denies  pain     Cognitive-Linquistic Treatment   Treatment focused on Voice   Skilled Treatment Daily Task #1 (Maximum sustained "ah"): Average 17 seconds, 85 dB. Daily Task 2 (Maximum fundamental frequency range): Highs: 15 high pitched "ah" given mod cues. Lows: 15 low pitched "ah" given min-mod cues. Daily task #3 (Maximum speech loudness drill of functional phrases): Average 75 dB.  Hierarchal speech loudness drill: Read paragraphs, 74 dB. Homework: assignments completed.  Off the cuff remarks: average 71 dB.     Assessment / Recommendations / Plan   Plan Continue with current plan of care     Progression Toward Goals   Progression toward goals Progressing toward goals          SLP Education - 06/29/16 1250    Education provided Yes   Education Details LSVT-LOUD   Person(s) Educated Patient   Methods Explanation   Comprehension Verbalized understanding            SLP Long Term Goals - 06/14/16 1311      SLP LONG TERM GOAL #1   Title The patient will complete Daily Tasks (Maximum duration "ah", High/Lows, and Functional Phrases) at average loudness of 80 dB and with loud, good quality voice.    Time 4  Period Weeks   Status New     SLP LONG TERM GOAL #2   Title The patient will complete Hierarchal Speech Loudness reading drills (words/phrases, sentences, and paragraph) at average 75 dB and with loud, good quality voice.     Time 4   Period Weeks   Status New     SLP LONG TERM GOAL #3   Title The patient will participate in conversation, maintaining average loudness of 75 dB and loud, good quality voice.   Time 4   Period Weeks   Status New     SLP LONG TERM GOAL #4   Title The patient will complete homework daily.   Time 4   Period Weeks   Status New          Plan - 06/29/16 1251    Clinical Impression Statement The patient is completing daily tasks and hierarchical speech drill task with louder, better quality voice provided minimal SLP cues. She is also  incorporating louder, better quality voice into conversation with minimal cues.    Speech Therapy Frequency 4x / week   Duration 4 weeks   Treatment/Interventions Other (comment)  LSVT-LOUD   Potential to Achieve Goals Good   Potential Considerations Ability to learn/carryover information;Family/community support;Cooperation/participation level   SLP Home Exercise Plan LSVT-LOUD daily homework   Consulted and Agree with Plan of Care Patient      Patient will benefit from skilled therapeutic intervention in order to improve the following deficits and impairments:   Dysphonia    Problem List Patient Active Problem List   Diagnosis Date Noted  . Hyponatremia 12/31/2015   [name], MS/CCC- SLP  Lou Miner 06/29/2016, 12:52 PM  [place] MAIN [facility] SERVICES [address] [place], [place], [address] Phone: [phone]   Fax:  [phone]   Name: BRIGHID VONDRACEK MRN: [id number] Date of Birth: [birth year]

## 2016-06-30 ENCOUNTER — Encounter: Payer: Self-pay | Admitting: Physical Therapy

## 2016-06-30 ENCOUNTER — Ambulatory Visit: Payer: Medicare Other | Admitting: Speech Pathology

## 2016-06-30 ENCOUNTER — Encounter: Payer: Self-pay | Admitting: Speech Pathology

## 2016-06-30 ENCOUNTER — Ambulatory Visit: Payer: Medicare Other | Admitting: Physical Therapy

## 2016-06-30 DIAGNOSIS — R490 Dysphonia: Secondary | ICD-10-CM

## 2016-06-30 DIAGNOSIS — R262 Difficulty in walking, not elsewhere classified: Secondary | ICD-10-CM

## 2016-06-30 NOTE — Therapy (Signed)
[place] MAIN [facility] SERVICES [address] [place], [place], [address] Phone: [phone]   Fax:  [phone]  Speech Language Pathology Treatment  Patient Details  Name: Stephanie [name] MRN: [id number] Date of Birth: 02/20/1946 Referring Provider: Dr. Manuella Ghazi  Encounter Date: 06/30/2016      End of Session - 06/30/16 1230    Visit Number 8   Number of Visits 17   Date for SLP Re-Evaluation 07/16/16   SLP Start Time 0845   SLP Stop Time  0945   SLP Time Calculation (min) 60 min   Activity Tolerance Patient tolerated treatment well      Past Medical History:  Diagnosis Date  . Anemia   . Arthritis   . Chronic back pain   . Diabetes mellitus without complication ([place])   . Dysrhythmia   . GERD (gastroesophageal reflux disease)   . Hyperlipidemia   . Hypertension   . Hypothyroid   . Sleep apnea   . Syncope   . Tremors of nervous system     Past Surgical History:  Procedure Laterality Date  . ABDOMINAL HYSTERECTOMY    . BACK SURGERY  09/16  . CARDIAC CATHETERIZATION    . CATARACT EXTRACTION W/PHACO Right 11/24/2015   Procedure: CATARACT EXTRACTION PHACO AND INTRAOCULAR LENS PLACEMENT (IOC);  Surgeon: Estill Cotta, MD;  Location: [facility] ORS;  Service: Ophthalmology;  Laterality: Right;  [place] 01:14 AP% 25.5 CDE 30.69 fluid pack lot # [id number]  . COLONOSCOPY    . OOPHORECTOMY      There were no vitals filed for this visit.      Subjective Assessment - 06/30/16 1229    Subjective The patient reported that she is trying to use her loud voice at home and is completing homework exercises. The patient used her loud voice upon arrival and in opening conversation without prompting.    Currently in Pain? No/denies               ADULT SLP TREATMENT - 06/30/16 0001      General Information   Behavior/Cognition Alert;Pleasant mood;Cooperative     Treatment Provided   Treatment provided Cognitive-Linquistic     Pain  Assessment   Pain Assessment No/denies pain     Cognitive-Linquistic Treatment   Treatment focused on Voice   Skilled Treatment (P)  Daily Task #1 (Maximum sustained "ah"): Average 16 seconds, 95 dB. Daily Task 2 (Maximum fundamental frequency range): Highs: 15 high pitched "ah" given min cues. Lows: 15 low pitched "ah" given min cues. Daily task #3 (Maximum speech loudness drill of functional phrases): Average 74 dB, min cues. Hierarchal speech loudness drill: Read paragraphs, 72 dB min-no cues. Homework: assignments completed. Off the cuff remarks: average 71 dB.     Assessment / Recommendations / Plan   Plan Continue with current plan of care     Progression Toward Goals   Progression toward goals Progressing toward goals          SLP Education - 06/30/16 1230    Education provided Yes   Education Details LSVT-LOUD   Person(s) Educated Patient   Methods Explanation   Comprehension Verbalized understanding            SLP Long Term Goals - 06/14/16 1311      SLP LONG TERM GOAL #1   Title The patient will complete Daily Tasks (Maximum duration "ah", High/Lows, and Functional Phrases) at average loudness of 80 dB and with loud, good quality voice.  Time 4   Period Weeks   Status New     SLP LONG TERM GOAL #2   Title The patient will complete Hierarchal Speech Loudness reading drills (words/phrases, sentences, and paragraph) at average 75 dB and with loud, good quality voice.     Time 4   Period Weeks   Status New     SLP LONG TERM GOAL #3   Title The patient will participate in conversation, maintaining average loudness of 75 dB and loud, good quality voice.   Time 4   Period Weeks   Status New     SLP LONG TERM GOAL #4   Title The patient will complete homework daily.   Time 4   Period Weeks   Status New          Plan - 06/30/16 1231    Clinical Impression Statement The patient is completing daily tasks and hierarchical speech drill tasks with louder,  better quality voice with minimal to no cues. Her volume has continued to increase in exercises over the last three sessions. She incorporates LOUD into conversation with minimal to no cues.    Speech Therapy Frequency 4x / week   Duration 4 weeks   Treatment/Interventions Other (comment)  LSVT-LOUD   Potential to Achieve Goals Good   Potential Considerations Cooperation/participation level;Ability to learn/carryover information;Family/community support   SLP Home Exercise Plan LSVT-LOUD daily homework tasks and carryover assingment (talk on phone with daughter)   Consulted and Agree with Plan of Care Patient      Patient will benefit from skilled therapeutic intervention in order to improve the following deficits and impairments:   Dysphonia    Problem List Patient Active Problem List   Diagnosis Date Noted  . Hyponatremia 12/31/2015    Rickard Rhymes  Graduate Student Clinician 06/30/2016, 12:35 PM  [place] MAIN [facility] SERVICES [address] [place], [place], [address] Phone: [phone]   Fax:  [phone]   Name: Stephanie [name] MRN: [id number] Date of Birth: 01/26/1946

## 2016-06-30 NOTE — Therapy (Signed)
[place] MAIN [facility] SERVICES [address] [place], [place], [address] Phone: [phone]   Fax:  [phone]  Physical Therapy Treatment  Patient Details  Name: Stephanie [name] MRN: [id number] Date of Birth: 1946/07/30 Referring Provider: Vladimir Crofts   Encounter Date: 06/30/2016      PT End of Session - 06/30/16 1007    Visit Number 8   Number of Visits 17   Date for PT Re-Evaluation 08-01-2016   Authorization Type g codes   Authorization Time Period 8/10   PT Start Time 1000   PT Stop Time 1100   PT Time Calculation (min) 60 min   Equipment Utilized During Treatment Gait belt   Activity Tolerance Patient tolerated treatment well;Patient limited by pain;Patient limited by fatigue   Behavior During Therapy [facility] for tasks assessed/performed      Past Medical History:  Diagnosis Date  . Anemia   . Arthritis   . Chronic back pain   . Diabetes mellitus without complication ([place])   . Dysrhythmia   . GERD (gastroesophageal reflux disease)   . Hyperlipidemia   . Hypertension   . Hypothyroid   . Sleep apnea   . Syncope   . Tremors of nervous system     Past Surgical History:  Procedure Laterality Date  . ABDOMINAL HYSTERECTOMY    . BACK SURGERY  09/16  . CARDIAC CATHETERIZATION    . CATARACT EXTRACTION W/PHACO Right 11/24/2015   Procedure: CATARACT EXTRACTION PHACO AND INTRAOCULAR LENS PLACEMENT (IOC);  Surgeon: Estill Cotta, MD;  Location: [facility] ORS;  Service: Ophthalmology;  Laterality: Right;  [place] 01:14 AP% 25.5 CDE 30.69 fluid pack lot # [id number] H  . COLONOSCOPY    . OOPHORECTOMY      There were no vitals filed for this visit.      Subjective Assessment - 06/30/16 1006    Subjective Patient is doing her LSVT BIG exercises at home.   Limitations Walking   How long can you sit comfortably? yes   How long can you stand comfortably? 10 mintues   How long can you walk comfortably? a few minutes   Patient Stated  Goals to be able to walk longer   Currently in Pain? Yes   Pain Score 5    Pain Location Back   Pain Orientation Right   Pain Descriptors / Indicators Aching   Pain Onset More than a month ago   Pain Frequency Intermittent        Patient seen for LSVT Daily Session Maximal Daily Exercises for facilitation/coordination of movement Sustained movements are designed to rescale the amplitude of movement output for generalization to daily functional activities .Performed as follows for 1 set of 10 repetitions each multidirectional sustained movements 1) Floor to ceiling 2) Side to side multidirectional Repetitive movements performed in standing and are designed to provide retraining effort needed for sustained muscle activation in tasks   3) Step and reach forward 4) Step and reach backwards 5) Step and reach sideways 6) Rock and reach forward/backward 7) Rock and reach sideways Sit to stand functional component task with supervision 5 reps and simulated activities for:  improved stepping on steps with ascending steps x 5 Coordination for fine motor control with pegs and card dealing for buttons and handling keys Scooting in and out with a chair at a table Working on reaching activities with LUE for pulling clothing out of the washer and dryer Ambulation with BIg arm swing 1000 feet x  3                          PT Education - 06/30/16 1007    Education provided Yes   Education Details LSVT BIG   Person(s) Educated Patient   Methods Explanation   Comprehension Verbalized understanding             PT Long Term Goals - 06/14/16 1031      PT LONG TERM GOAL #1   Title Patient will be independent in home exercise program to improve strength/mobility for better functional independence with ADLs.   Time 4   Period Weeks   Status New     PT LONG TERM GOAL #2   Title Patient (> 28 years old) will complete five times sit to stand test in < 15 seconds indicating an  increased LE strength and improved balance.   Time 4   Period Weeks   Status New     PT LONG TERM GOAL #3   Title Patient will increase six minute walk test distance to >1000 for progression to community ambulator and improve gait ability   Time 4   Period Weeks   Status New     PT LONG TERM GOAL #4   Title Patient will increase 10 meter walk test to >1.56m/s as to improve gait speed for better community ambulation and to reduce fall risk.   Time 4   Period Weeks   Status New               Plan - 06/30/16 1008    Clinical Impression Statement  Decreased coordination demonstrated requiring consistent verbal cueing to correct form.   Rehab Potential Good   PT Frequency 4x / week   PT Duration 4 weeks   PT Treatment/Interventions Therapeutic exercise;Balance training;Neuromuscular re-education;Therapeutic activities;Gait training   PT Next Visit Plan LSVT BIG   PT Home Exercise Plan LSVT BIG   Consulted and Agree with Plan of Care Patient      Patient will benefit from skilled therapeutic intervention in order to improve the following deficits and impairments:  Abnormal gait, Decreased balance, Decreased mobility, Difficulty walking, Impaired sensation, Obesity, Pain, Impaired flexibility, Decreased strength, Decreased coordination, Decreased activity tolerance  Visit Diagnosis: Difficulty in walking, not elsewhere classified     Problem List Patient Active Problem List   Diagnosis Date Noted  . Hyponatremia 12/31/2015  Alanson Puls, PT, DPT  [place], Ettrick S 06/30/2016, 10:10 AM  [place] MAIN [facility] SERVICES [address] [place], [place], [address] Phone: [phone]   Fax:  [phone]  Name: Stephanie [name] MRN: [id number] Date of Birth: 1945/11/29

## 2016-07-01 ENCOUNTER — Ambulatory Visit: Payer: Medicare Other | Admitting: Speech Pathology

## 2016-07-01 ENCOUNTER — Ambulatory Visit: Payer: Medicare Other | Admitting: Physical Therapy

## 2016-07-01 ENCOUNTER — Encounter: Payer: Self-pay | Admitting: Physical Therapy

## 2016-07-01 ENCOUNTER — Encounter: Payer: Self-pay | Admitting: Speech Pathology

## 2016-07-01 DIAGNOSIS — R490 Dysphonia: Secondary | ICD-10-CM | POA: Diagnosis not present

## 2016-07-01 DIAGNOSIS — R262 Difficulty in walking, not elsewhere classified: Secondary | ICD-10-CM

## 2016-07-01 NOTE — Therapy (Signed)
[place] MAIN [facility] SERVICES [address] [place], [place], [address] Phone: [phone]   Fax:  [phone]  Physical Therapy Treatment  Patient Details  Name: Stephanie [name] MRN: [id number] Date of Birth: 09/19/1946 Referring Provider: Vladimir Crofts   Encounter Date: 07/01/2016      PT End of Session - 07/01/16 1006    Visit Number 9   Number of Visits 17   Date for PT Re-Evaluation August 10, 2016   Authorization Type g codes   Authorization Time Period 9/10   Equipment Utilized During Treatment Gait belt   Activity Tolerance Patient tolerated treatment well;Patient limited by pain;Patient limited by fatigue   Behavior During Therapy [facility] for tasks assessed/performed      Past Medical History:  Diagnosis Date  . Anemia   . Arthritis   . Chronic back pain   . Diabetes mellitus without complication ([place])   . Dysrhythmia   . GERD (gastroesophageal reflux disease)   . Hyperlipidemia   . Hypertension   . Hypothyroid   . Sleep apnea   . Syncope   . Tremors of nervous system     Past Surgical History:  Procedure Laterality Date  . ABDOMINAL HYSTERECTOMY    . BACK SURGERY  09/16  . CARDIAC CATHETERIZATION    . CATARACT EXTRACTION W/PHACO Right 11/24/2015   Procedure: CATARACT EXTRACTION PHACO AND INTRAOCULAR LENS PLACEMENT (IOC);  Surgeon: Estill Cotta, MD;  Location: [facility] ORS;  Service: Ophthalmology;  Laterality: Right;  [place] 01:14 AP% 25.5 CDE 30.69 fluid pack lot # [id number] H  . COLONOSCOPY    . OOPHORECTOMY      There were no vitals filed for this visit.      Subjective Assessment - 07/01/16 1005    Subjective Patient is doing her LSVT BIG exercises at home.   Limitations Walking   How long can you sit comfortably? yes   How long can you stand comfortably? 10 mintues   How long can you walk comfortably? a few minutes   Patient Stated Goals to be able to walk longer   Currently in Pain? No/denies   Pain Score  0-No pain   Pain Onset More than a month ago        Patient seen for LSVT Daily Session Maximal Daily Exercises for facilitation/coordination of movement Sustained movements are designed to rescale the amplitude of movement output for generalization to daily functional activities .Performed as follows for 1 set of 10 repetitions each multidirectional sustained movements 1) Floor to ceiling 2) Side to side multidirectional Repetitive movements performed in standing and are designed to provide retraining effort needed for sustained muscle activation in tasks   3) Step and reach forward 4) Step and reach backwards 5) Step and reach sideways 6) Rock and reach forward/backward 7) Rock and reach sideways Sit to stand functional component task with supervision 5 reps and simulated activities for:  improved stepping on steps with ascending steps x 5 Coordination for fine motor control with pegs and card dealing for buttons and handling keys Scooting in and out with a chair at a table Working on reaching activities with LUE for pulling clothing out of the washer and dryer Ambulation with BIg arm swing 1000 feet x 3                          PT Education - 07/01/16 1006    Education provided Yes   Education Details LSVT  BIG   Person(s) Educated Patient   Comprehension Verbalized understanding             PT Long Term Goals - 06/14/16 1031      PT LONG TERM GOAL #1   Title Patient will be independent in home exercise program to improve strength/mobility for better functional independence with ADLs.   Time 4   Period Weeks   Status New     PT LONG TERM GOAL #2   Title Patient (> 20 years old) will complete five times sit to stand test in < 15 seconds indicating an increased LE strength and improved balance.   Time 4   Period Weeks   Status New     PT LONG TERM GOAL #3   Title Patient will increase six minute walk test distance to >1000 for progression to community  ambulator and improve gait ability   Time 4   Period Weeks   Status New     PT LONG TERM GOAL #4   Title Patient will increase 10 meter walk test to >1.41m/s as to improve gait speed for better community ambulation and to reduce fall risk.   Time 4   Period Weeks   Status New               Plan - 07/01/16 1006    Clinical Impression Statement Decreased coordination demonstrated requiring consistent verbal cueing to correct form.   Rehab Potential Good   PT Frequency 4x / week   PT Duration 4 weeks   PT Treatment/Interventions Therapeutic exercise;Balance training;Neuromuscular re-education;Therapeutic activities;Gait training   PT Next Visit Plan LSVT BIG   PT Home Exercise Plan LSVT BIG   Consulted and Agree with Plan of Care Patient      Patient will benefit from skilled therapeutic intervention in order to improve the following deficits and impairments:  Abnormal gait, Decreased balance, Decreased mobility, Difficulty walking, Impaired sensation, Obesity, Pain, Impaired flexibility, Decreased strength, Decreased coordination, Decreased activity tolerance  Visit Diagnosis: Difficulty in walking, not elsewhere classified     Problem List Patient Active Problem List   Diagnosis Date Noted  . Hyponatremia 12/31/2015   Alanson Puls, PT, DPT [place], Minette Headland S 07/01/2016, 10:09 AM  [place] MAIN [facility] SERVICES [address] [place], [place], [address] Phone: [phone]   Fax:  [phone]  Name: Stephanie [name] MRN: [id number] Date of Birth: Feb 03, 1946

## 2016-07-01 NOTE — Therapy (Signed)
[place] MAIN [facility] SERVICES [address] [place], [place], [address] Phone: [phone]   Fax:  [phone]  Speech Language Pathology Treatment  Patient Details  Name: Stephanie [name] MRN: [id number] Date of Birth: [birth year] Referring Provider: Dr. Manuella Ghazi  Encounter Date: 07/01/2016      End of Session - 07/01/16 0954    Visit Number 9   Number of Visits 17   Date for SLP Re-Evaluation 07/16/16   SLP Start Time 0850   SLP Stop Time  0945   SLP Time Calculation (min) 55 min   Activity Tolerance Patient tolerated treatment well      Past Medical History:  Diagnosis Date  . Anemia   . Arthritis   . Chronic back pain   . Diabetes mellitus without complication ([place])   . Dysrhythmia   . GERD (gastroesophageal reflux disease)   . Hyperlipidemia   . Hypertension   . Hypothyroid   . Sleep apnea   . Syncope   . Tremors of nervous system     Past Surgical History:  Procedure Laterality Date  . ABDOMINAL HYSTERECTOMY    . BACK SURGERY  09/16  . CARDIAC CATHETERIZATION    . CATARACT EXTRACTION W/PHACO Right 11/24/2015   Procedure: CATARACT EXTRACTION PHACO AND INTRAOCULAR LENS PLACEMENT (IOC);  Surgeon: Estill Cotta, MD;  Location: [facility] ORS;  Service: Ophthalmology;  Laterality: Right;  [place] 01:14 AP% 25.5 CDE 30.69 fluid pack lot # [id number]  . COLONOSCOPY    . OOPHORECTOMY      There were no vitals filed for this visit.      Subjective Assessment - 07/01/16 0954    Subjective The patient reported that she is trying to use her loud voice at home and is completing homework exercises. The patient used her loud voice upon arrival and in opening conversation without prompting.                ADULT SLP TREATMENT - 07/01/16 0001      General Information   Behavior/Cognition Alert;Pleasant mood;Cooperative     Treatment Provided   Treatment provided Cognitive-Linquistic     Pain Assessment   Pain Assessment  No/denies pain     Cognitive-Linquistic Treatment   Treatment focused on Voice   Skilled Treatment Daily Task #1 (Maximum sustained "ah"): Average 17 seconds, 85 dB. Daily Task 2 (Maximum fundamental frequency range): Highs: 15 high pitched "ah" given min cues. Lows: 15 low pitched "ah" given min cues. Daily task #3 (Maximum speech loudness drill of functional phrases): Average 75 dB.  Hierarchal speech loudness drill: Read paragraphs, 75 dB. Homework: assignments completed.  Off the cuff remarks: average 73 dB.     Assessment / Recommendations / Plan   Plan Continue with current plan of care     Progression Toward Goals   Progression toward goals Progressing toward goals          SLP Education - 07/01/16 0954    Education provided Yes   Education Details LSVT-LOUD   Person(s) Educated Patient   Methods Explanation   Comprehension Verbalized understanding            SLP Long Term Goals - 06/14/16 1311      SLP LONG TERM GOAL #1   Title The patient will complete Daily Tasks (Maximum duration "ah", High/Lows, and Functional Phrases) at average loudness of 80 dB and with loud, good quality voice.    Time 4   Period Weeks  Status New     SLP LONG TERM GOAL #2   Title The patient will complete Hierarchal Speech Loudness reading drills (words/phrases, sentences, and paragraph) at average 75 dB and with loud, good quality voice.     Time 4   Period Weeks   Status New     SLP LONG TERM GOAL #3   Title The patient will participate in conversation, maintaining average loudness of 75 dB and loud, good quality voice.   Time 4   Period Weeks   Status New     SLP LONG TERM GOAL #4   Title The patient will complete homework daily.   Time 4   Period Weeks   Status New          Plan - 07/01/16 0955    Clinical Impression Statement The patient is completing daily tasks and hierarchal speech drill tasks with loud, good quality voice.  She is demonstrating generalization into  conversational speech.   Speech Therapy Frequency 4x / week   Duration 4 weeks   Treatment/Interventions Other (comment)  LSVT-LOUD   Potential to Achieve Goals Good   Potential Considerations Cooperation/participation level;Ability to learn/carryover information;Family/community support   SLP Home Exercise Plan LSVT-LOUD daily homework    Consulted and Agree with Plan of Care Patient      Patient will benefit from skilled therapeutic intervention in order to improve the following deficits and impairments:   Dysphonia    Problem List Patient Active Problem List   Diagnosis Date Noted  . Hyponatremia 12/31/2015   [name], MS/CCC- SLP  Lou Miner 07/01/2016, 9:56 AM  [place] MAIN [facility] SERVICES [address] [place], [place], [address] Phone: [phone]   Fax:  [phone]   Name: Stephanie [name] MRN: [id number] Date of Birth: 02-05-1946

## 2016-07-06 ENCOUNTER — Ambulatory Visit: Payer: Medicare Other | Admitting: Speech Pathology

## 2016-07-06 ENCOUNTER — Ambulatory Visit: Payer: Medicare Other | Admitting: Physical Therapy

## 2016-07-07 ENCOUNTER — Ambulatory Visit: Payer: Medicare Other | Attending: Neurology | Admitting: Speech Pathology

## 2016-07-07 ENCOUNTER — Encounter: Payer: Self-pay | Admitting: Speech Pathology

## 2016-07-07 ENCOUNTER — Encounter: Payer: Self-pay | Admitting: Physical Therapy

## 2016-07-07 ENCOUNTER — Ambulatory Visit: Payer: Medicare Other | Admitting: Physical Therapy

## 2016-07-07 DIAGNOSIS — R490 Dysphonia: Secondary | ICD-10-CM | POA: Diagnosis present

## 2016-07-07 DIAGNOSIS — R262 Difficulty in walking, not elsewhere classified: Secondary | ICD-10-CM | POA: Insufficient documentation

## 2016-07-07 NOTE — Therapy (Signed)
[place] MAIN [facility] SERVICES [address] [place], [place], [address] Phone: [phone]   Fax:  [phone]  Speech Language Pathology Treatment  Patient Details  Name: Stephanie [name] MRN: [id number] Date of Birth: [birth year] Referring Provider: Dr. Manuella Ghazi  Encounter Date: 07/07/2016      End of Session - 07/07/16 1243    Visit Number 10   Number of Visits 17   Date for SLP Re-Evaluation 07/16/16   SLP Start Time 0850   SLP Stop Time  0950   SLP Time Calculation (min) 60 min   Activity Tolerance Patient tolerated treatment well      Past Medical History:  Diagnosis Date  . Anemia   . Arthritis   . Chronic back pain   . Diabetes mellitus without complication ([place])   . Dysrhythmia   . GERD (gastroesophageal reflux disease)   . Hyperlipidemia   . Hypertension   . Hypothyroid   . Sleep apnea   . Syncope   . Tremors of nervous system     Past Surgical History:  Procedure Laterality Date  . ABDOMINAL HYSTERECTOMY    . BACK SURGERY  09/16  . CARDIAC CATHETERIZATION    . CATARACT EXTRACTION W/PHACO Right 11/24/2015   Procedure: CATARACT EXTRACTION PHACO AND INTRAOCULAR LENS PLACEMENT (IOC);  Surgeon: Estill Cotta, MD;  Location: [facility] ORS;  Service: Ophthalmology;  Laterality: Right;  [place] 01:14 AP% 25.5 CDE 30.69 fluid pack lot # [id number]  . COLONOSCOPY    . OOPHORECTOMY      There were no vitals filed for this visit.      Subjective Assessment - 07/07/16 1232    Subjective The patient was speaking with good volume with another patient in the waiting room prior to her session. She also reported that she is always trying to speak loudly. Over the last few days, she asked several family members and healthcare providers if she was speaking loudly enough for them to hear her clearly and received positive feedback.    Currently in Pain? No/denies               ADULT SLP TREATMENT - 07/07/16 0001      General  Information   Behavior/Cognition Alert;Pleasant mood;Cooperative     Treatment Provided   Treatment provided Cognitive-Linquistic     Pain Assessment   Pain Assessment No/denies pain     Cognitive-Linquistic Treatment   Treatment focused on Voice   Skilled Treatment Daily Task #1 (Maximum sustained "ah"): Average 20 seconds, 86 dB. Daily Task 2 (Maximum fundamental frequency range): Highs: 15 high pitched "ah" given min cues. Lows: 15 low pitched "ah" given min cues. Daily task #3 (Maximum speech loudness drill of functional phrases): Average 74 dB.  Hierarchal speech loudness drill: Read paragraphs, average 72 dB given min cues. Homework: assignments completed daily and carryover tasks completed.  Off the cuff remarks: average 73 dB.     Assessment / Recommendations / Plan   Plan Continue with current plan of care     Progression Toward Goals   Progression toward goals Progressing toward goals          SLP Education - 07/07/16 1232    Education provided Yes   Education Details LSVT LOUD   Person(s) Educated Patient   Methods Explanation   Comprehension Verbalized understanding            SLP Long Term Goals - 07/07/16 1235      SLP LONG  TERM GOAL #1   Title The patient will complete Daily Tasks (Maximum duration "ah", High/Lows, and Functional Phrases) at average loudness of 80 dB and with loud, good quality voice.    Time 4   Period Weeks   Status Partially Met     SLP LONG TERM GOAL #2   Title The patient will complete Hierarchal Speech Loudness reading drills (words/phrases, sentences, and paragraph) at average 75 dB and with loud, good quality voice.     Time 4   Period Weeks   Status Partially Met     SLP LONG TERM GOAL #3   Title The patient will participate in conversation, maintaining average loudness of 75 dB and loud, good quality voice.   Time 4   Period Weeks   Status Partially Met     SLP LONG TERM GOAL #4   Title The patient will complete  homework daily.   Time 4   Period Weeks   Status On-going          Plan - 07/07/16 1244    Clinical Impression Statement The patient is completing daily tasks and hierarchical speech drill tasks with louder, better quality voice with minimal SLP cues. She is generalizing use of LOUD techniques to everyday conversation with minimal reminders. She states that she "always thinks about speaking loud, whenever she speaks." The patient claims to complete daily homework tasks every day, and her strong volume, quality, and duration of daily exercises at the beginning of the session without a clinician model supports this statement. The patient is also demonstrating good posture, breath support, and articulation during daily exercises without having received any explicit cues to this regard.    Speech Therapy Frequency 4x / week   Duration 4 weeks   Treatment/Interventions Other (comment)  LSVT LOUD   Potential to Achieve Goals Good   Potential Considerations Cooperation/participation level;Ability to learn/carryover information;Family/community support   SLP Home Exercise Plan LSVT-LOUD daily homework    Consulted and Agree with Plan of Care Patient      Patient will benefit from skilled therapeutic intervention in order to improve the following deficits and impairments:   Dysphonia    Problem List Patient Active Problem List   Diagnosis Date Noted  . Hyponatremia 12/31/2015       Graduate Student Clinician 07/07/2016, 12:44 PM  [place] [facility] SERVICES [address] [place], [place], [address] Phone: [phone]   Fax:  [phone]   Name: Stephanie [name] MRN: [id number] Date of Birth: 09/05/1946  

## 2016-07-07 NOTE — Therapy (Addendum)
[place] MAIN [facility] SERVICES [address] [place], [place], [address] Phone: [phone]   Fax:  [phone]  Physical Therapy Treatment  Patient Details  Name: Stephanie [name] MRN: [id number] Date of Birth: 03-Feb-1946 Referring Provider: Jennings Books K   Encounter Date: 07/07/2016      PT End of Session - 07/07/16 1005    Visit Number 10   Number of Visits 17   Date for PT Re-Evaluation August 14, 2016   Authorization Type g codes   Authorization Time Period 10/10   PT Start Time 1000   PT Stop Time 1100   PT Time Calculation (min) 60 min   Equipment Utilized During Treatment Gait belt   Activity Tolerance Patient tolerated treatment well;Patient limited by pain;Patient limited by fatigue   Behavior During Therapy [facility] for tasks assessed/performed      Past Medical History:  Diagnosis Date  . Anemia   . Arthritis   . Chronic back pain   . Diabetes mellitus without complication ([place])   . Dysrhythmia   . GERD (gastroesophageal reflux disease)   . Hyperlipidemia   . Hypertension   . Hypothyroid   . Sleep apnea   . Syncope   . Tremors of nervous system     Past Surgical History:  Procedure Laterality Date  . ABDOMINAL HYSTERECTOMY    . BACK SURGERY  09/16  . CARDIAC CATHETERIZATION    . CATARACT EXTRACTION W/PHACO Right 11/24/2015   Procedure: CATARACT EXTRACTION PHACO AND INTRAOCULAR LENS PLACEMENT (IOC);  Surgeon: Estill Cotta, MD;  Location: [facility] ORS;  Service: Ophthalmology;  Laterality: Right;  [place] 01:14 AP% 25.5 CDE 30.69 fluid pack lot # [id number]  . COLONOSCOPY    . OOPHORECTOMY      There were no vitals filed for this visit.      Subjective Assessment - 07/07/16 1004    Subjective Patient is doing her LSVT BIG exercises at home.   Limitations Walking   How long can you sit comfortably? yes   How long can you stand comfortably? 10 mintues   How long can you walk comfortably? a few minutes   Patient Stated  Goals to be able to walk longer   Currently in Pain? No/denies   Pain Score 0-No pain      OUTCOME MEASURES: TEST Outcome Interpretation  5 times sit<>stand 16.78 sec >60 yo, >15 sec indicates increased risk for falls  10 meter walk test         .72        m/s <1.0 m/s indicates increased risk for falls; limited community ambulator  Timed up and Go      12.93          sec <14 sec indicates increased risk for falls  6 minute walk test    700           Feet 1000 feet is community ambulator      [address] Peg Test L:   36.55            R: 2o.26      Patient seen for LSVT Daily Session Maximal Daily Exercises for facilitation/coordination of movement Sustained movements are designed to rescale the amplitude of movement output for generalization to daily functional activities .Performed as follows for 1 set of 10 repetitions each multidirectional sustained movements 1) Floor to ceiling 2) Side to side multidirectional Repetitive movements performed in standing and are designed to provide retraining effort needed for sustained  muscle activation in tasks   3) Step and reach forward 4) Step and reach backwards 5) Step and reach sideways 6) Rock and reach forward/backward 7) Rock and reach sideways Sit to stand functional component task with supervision 5 reps and simulated activities for:  improved stepping on steps with ascending steps x 5 Coordination for fine motor control with pegs and card dealing for buttons and handling keys Scooting in and out with a chair at a table Working on reaching activities with LUE for pulling clothing out of the washer and dryer Ambulation with Big arm swing 1000 feet x 3                          PT Education - 07-10-16 1005    Education provided Yes   Education Details LSVT BIG   Person(s) Educated Patient   Methods Explanation   Comprehension Verbalized understanding             PT Long Term Goals - 06/14/16 1031      PT LONG  TERM GOAL #1   Title Patient will be independent in home exercise program to improve strength/mobility for better functional independence with ADLs.   Time 4   Period Weeks   Status New     PT LONG TERM GOAL #2   Title Patient (> 105 years old) will complete five times sit to stand test in < 15 seconds indicating an increased LE strength and improved balance.   Time 4   Period Weeks   Status New     PT LONG TERM GOAL #3   Title Patient will increase six minute walk test distance to >1000 for progression to community ambulator and improve gait ability   Time 4   Period Weeks   Status New     PT LONG TERM GOAL #4   Title Patient will increase 10 meter walk test to >1.81m/s as to improve gait speed for better community ambulation and to reduce fall risk.   Time 4   Period Weeks   Status New               Plan - 07/10/2016 1006    Clinical Impression Statement . Decreased coordination demonstrated requiring consistent verbal cueing to correct form   Rehab Potential Good   PT Frequency 4x / week   PT Duration 4 weeks   PT Treatment/Interventions Therapeutic exercise;Balance training;Neuromuscular re-education;Therapeutic activities;Gait training   PT Next Visit Plan LSVT BIG   PT Home Exercise Plan LSVT BIG   Consulted and Agree with Plan of Care Patient      Patient will benefit from skilled therapeutic intervention in order to improve the following deficits and impairments:  Abnormal gait, Decreased balance, Decreased mobility, Difficulty walking, Impaired sensation, Obesity, Pain, Impaired flexibility, Decreased strength, Decreased coordination, Decreased activity tolerance  Visit Diagnosis: Difficulty in walking, not elsewhere classified       G-Codes - 07/10/2016 1007    Functional Assessment Tool Used TIUG, 5 x sit to stand, 10 MW, 6 Mw   Functional Limitation Mobility: Walking and moving around   Mobility: Walking and Moving Around Current Status [id number]) At least  40 percent but less than 60 percent impaired, limited or restricted   Mobility: Walking and Moving Around Goal Status [phone]) At least 20 percent but less than 40 percent impaired, limited or restricted      Problem List Patient Active Problem List   Diagnosis Date  Noted  . Hyponatremia 12/31/2015    Arelia Sneddon S 07/07/2016, 10:08 AM  [place] MAIN [facility] SERVICES [address] [place], [place], [address] Phone: [phone]   Fax:  [phone]  Name: Stephanie [name] MRN: [id number] Date of Birth: 26-Nov-1945

## 2016-07-08 ENCOUNTER — Ambulatory Visit: Payer: Medicare Other | Admitting: Physical Therapy

## 2016-07-08 ENCOUNTER — Encounter: Payer: Self-pay | Admitting: Physical Therapy

## 2016-07-08 ENCOUNTER — Encounter: Payer: Self-pay | Admitting: Speech Pathology

## 2016-07-08 ENCOUNTER — Ambulatory Visit: Payer: Medicare Other | Admitting: Speech Pathology

## 2016-07-08 DIAGNOSIS — R490 Dysphonia: Secondary | ICD-10-CM

## 2016-07-08 DIAGNOSIS — R262 Difficulty in walking, not elsewhere classified: Secondary | ICD-10-CM

## 2016-07-08 NOTE — Therapy (Signed)
[place] MAIN [facility] SERVICES [address] [place], [place], [address] Phone: [phone]   Fax:  [phone]  Speech Language Pathology Treatment  Patient Details  Name: Stephanie [name] MRN: [id number] Date of Birth: [birth year] Referring Provider: Dr. Manuella Ghazi  Encounter Date: 07/08/2016      End of Session - 07/08/16 0956    Visit Number 11   Number of Visits 17   Date for SLP Re-Evaluation 07/16/16   SLP Start Time 0850   SLP Stop Time  0946   SLP Time Calculation (min) 56 min      Past Medical History:  Diagnosis Date  . Anemia   . Arthritis   . Chronic back pain   . Diabetes mellitus without complication ([place])   . Dysrhythmia   . GERD (gastroesophageal reflux disease)   . Hyperlipidemia   . Hypertension   . Hypothyroid   . Sleep apnea   . Syncope   . Tremors of nervous system     Past Surgical History:  Procedure Laterality Date  . ABDOMINAL HYSTERECTOMY    . BACK SURGERY  09/16  . CARDIAC CATHETERIZATION    . CATARACT EXTRACTION W/PHACO Right 11/24/2015   Procedure: CATARACT EXTRACTION PHACO AND INTRAOCULAR LENS PLACEMENT (IOC);  Surgeon: Estill Cotta, MD;  Location: [facility] ORS;  Service: Ophthalmology;  Laterality: Right;  [place] 01:14 AP% 25.5 CDE 30.69 fluid pack lot # [id number]  . COLONOSCOPY    . OOPHORECTOMY      There were no vitals filed for this visit.      Subjective Assessment - 07/08/16 0955    Subjective "I try to think loud wherever I am"               ADULT SLP TREATMENT - 07/08/16 0001      General Information   Behavior/Cognition Alert;Pleasant mood;Cooperative     Treatment Provided   Treatment provided Cognitive-Linquistic     Pain Assessment   Pain Assessment No/denies pain     Cognitive-Linquistic Treatment   Treatment focused on Voice   Skilled Treatment Daily Task #1 (Maximum sustained "ah"): Average 17 seconds, 85 dB. Daily Task 2 (Maximum fundamental frequency range):  Highs: 15 high pitched "ah" given min cues. Lows: 15 low pitched "ah" given no cues. Daily task #3 (Maximum speech loudness drill of functional phrases): Average 75 dB.  Hierarchal speech loudness drill: Read paragraphs, 75 dB. Homework: assignments completed.  Off the cuff remarks: average 73 dB.     Assessment / Recommendations / Plan   Plan Continue with current plan of care     Progression Toward Goals   Progression toward goals Progressing toward goals          SLP Education - 07/08/16 0956    Education provided Yes   Education Details LSVT-LOUD   Person(s) Educated Patient   Methods Explanation   Comprehension Verbalized understanding            SLP Long Term Goals - 07/07/16 1235      SLP LONG TERM GOAL #1   Title The patient will complete Daily Tasks (Maximum duration "ah", High/Lows, and Functional Phrases) at average loudness of 80 dB and with loud, good quality voice.    Time 4   Period Weeks   Status Partially Met     SLP LONG TERM GOAL #2   Title The patient will complete Hierarchal Speech Loudness reading drills (words/phrases, sentences, and paragraph) at average 75 dB and  with loud, good quality voice.     Time 4   Period Weeks   Status Partially Met     SLP LONG TERM GOAL #3   Title The patient will participate in conversation, maintaining average loudness of 75 dB and loud, good quality voice.   Time 4   Period Weeks   Status Partially Met     SLP LONG TERM GOAL #4   Title The patient will complete homework daily.   Time 4   Period Weeks   Status On-going          Plan - 07/08/16 0956    Clinical Impression Statement The patient is completing daily tasks and hierarchal speech drill tasks with loud, good quality voice.  She is demonstrating generalization into conversational speech.   Speech Therapy Frequency 4x / week   Duration 4 weeks   Treatment/Interventions Other (comment)  LSVT-LOUD   Potential to Achieve Goals Good   Potential  Considerations Cooperation/participation level;Ability to learn/carryover information;Family/community support   SLP Home Exercise Plan LSVT-LOUD daily homework    Consulted and Agree with Plan of Care Patient      Patient will benefit from skilled therapeutic intervention in order to improve the following deficits and impairments:   Dysphonia      G-Codes - 07-24-16 1312    Functional Assessment Tool Used Clinical judgment, LSVT-LOUD protocol   Functional Limitations Voice   Voice Current Status (G9171) At least 20 percent but less than 40 percent impaired, limited or restricted   Voice Goal Status (G9172) At least 1 percent but less than 20 percent impaired, limited or restricted      Problem List Patient Active Problem List   Diagnosis Date Noted  . Hyponatremia 12/31/2015   [name], MS/CCC- SLP  Lou Miner 07/08/2016, 9:57 AM  [place] MAIN [facility] SERVICES [address] [place], [place], [address] Phone: [phone]   Fax:  [phone]   Name: Stephanie [name] MRN: [id number] Date of Birth: 05/13/1946

## 2016-07-08 NOTE — Therapy (Signed)
[place] MAIN [facility] SERVICES [address] [place], [place], [address] Phone: [phone]   Fax:  [phone]  Physical Therapy Treatment  Patient Details  Name: Stephanie [name] MRN: [id number] Date of Birth: 14-Jul-1946 Referring Provider: Vladimir Crofts   Encounter Date: 07/08/2016      PT End of Session - 07/08/16 1007    Visit Number 11   Number of Visits 17   Date for PT Re-Evaluation 08-14-2016   Authorization Type g codes   Authorization Time Period 1/10   PT Start Time 1000   PT Stop Time 1100   PT Time Calculation (min) 60 min   Equipment Utilized During Treatment Gait belt   Activity Tolerance Patient tolerated treatment well;Patient limited by pain;Patient limited by fatigue   Behavior During Therapy [facility] for tasks assessed/performed      Past Medical History:  Diagnosis Date  . Anemia   . Arthritis   . Chronic back pain   . Diabetes mellitus without complication ([place])   . Dysrhythmia   . GERD (gastroesophageal reflux disease)   . Hyperlipidemia   . Hypertension   . Hypothyroid   . Sleep apnea   . Syncope   . Tremors of nervous system     Past Surgical History:  Procedure Laterality Date  . ABDOMINAL HYSTERECTOMY    . BACK SURGERY  09/16  . CARDIAC CATHETERIZATION    . CATARACT EXTRACTION W/PHACO Right 11/24/2015   Procedure: CATARACT EXTRACTION PHACO AND INTRAOCULAR LENS PLACEMENT (IOC);  Surgeon: Estill Cotta, MD;  Location: [facility] ORS;  Service: Ophthalmology;  Laterality: Right;  [place] 01:14 AP% 25.5 CDE 30.69 fluid pack lot # [id number]  . COLONOSCOPY    . OOPHORECTOMY      There were no vitals filed for this visit.      Subjective Assessment - 07/08/16 1007    Subjective Patient is doing her LSVT BIG exercises at home.   Limitations Walking   How long can you sit comfortably? yes   How long can you stand comfortably? 10 mintues   How long can you walk comfortably? a few minutes   Patient Stated  Goals to be able to walk longer   Pain Score 0-No pain   Pain Onset More than a month ago        Patient seen for LSVT Daily Session Maximal Daily Exercises for facilitation/coordination of movement Sustained movements are designed to rescale the amplitude of movement output for generalization to daily functional activities .Performed as follows for 1 set of 10 repetitions each multidirectional sustained movements 1) Floor to ceiling 2) Side to side multidirectional Repetitive movements performed in standing and are designed to provide retraining effort needed for sustained muscle activation in tasks   3) Step and reach forward 4) Step and reach backwards 5) Step and reach sideways 6) Rock and reach forward/backward 7) Rock and reach sideways Sit to stand functional component task with supervision 5 reps and simulated activities for:  improved stepping on steps with ascending steps x 5 Coordination for fine motor control with pegs and card dealing for buttons and handling keys Scooting in and out with a chair at a table Working on reaching activities with LUE for pulling clothing out of the washer and dryer Ambulation with Big arm swing 1000 feet x 3                          PT Education -  07/08/16 1007    Education provided Yes   Education Details LSVT BIG   Person(s) Educated Patient   Methods Explanation   Comprehension Verbalized understanding             PT Long Term Goals - 06/14/16 1031      PT LONG TERM GOAL #1   Title Patient will be independent in home exercise program to improve strength/mobility for better functional independence with ADLs.   Time 4   Period Weeks   Status New     PT LONG TERM GOAL #2   Title Patient (> 66 years old) will complete five times sit to stand test in < 15 seconds indicating an increased LE strength and improved balance.   Time 4   Period Weeks   Status New     PT LONG TERM GOAL #3   Title Patient will increase  six minute walk test distance to >1000 for progression to community ambulator and improve gait ability   Time 4   Period Weeks   Status New     PT LONG TERM GOAL #4   Title Patient will increase 10 meter walk test to >1.12m/s as to improve gait speed for better community ambulation and to reduce fall risk.   Time 4   Period Weeks   Status New               Plan - 07/08/16 1008    Clinical Impression Statement .  Cues to increase intensity and amplitude of movements throughout session   Rehab Potential Good   PT Frequency 4x / week   PT Duration 4 weeks   PT Treatment/Interventions Therapeutic exercise;Balance training;Neuromuscular re-education;Therapeutic activities;Gait training   PT Next Visit Plan LSVT BIG   PT Home Exercise Plan LSVT BIG   Consulted and Agree with Plan of Care Patient      Patient will benefit from skilled therapeutic intervention in order to improve the following deficits and impairments:  Abnormal gait, Decreased balance, Decreased mobility, Difficulty walking, Impaired sensation, Obesity, Pain, Impaired flexibility, Decreased strength, Decreased coordination, Decreased activity tolerance  Visit Diagnosis: Difficulty in walking, not elsewhere classified       G-Codes - 2016/07/08 1007    Functional Assessment Tool Used TIUG, 5 x sit to stand, 10 MW, 6 Mw   Functional Limitation Mobility: Walking and moving around   Mobility: Walking and Moving Around Current Status [id number]) At least 40 percent but less than 60 percent impaired, limited or restricted   Mobility: Walking and Moving Around Goal Status [phone]) At least 20 percent but less than 40 percent impaired, limited or restricted      Problem List Patient Active Problem List   Diagnosis Date Noted  . Hyponatremia 12/31/2015  Alanson Puls, PT, DPT [place], Minette Headland S 07/08/2016, 10:09 AM  [place] MAIN [facility] SERVICES [address]  [place], [place], [address] Phone: [phone]   Fax:  [phone]  Name: Stephanie [name] MRN: [id number] Date of Birth: 02-15-1946

## 2016-07-12 ENCOUNTER — Encounter: Payer: Self-pay | Admitting: Physical Therapy

## 2016-07-12 ENCOUNTER — Ambulatory Visit: Payer: Medicare Other | Admitting: Physical Therapy

## 2016-07-12 ENCOUNTER — Ambulatory Visit: Payer: Medicare Other | Admitting: Speech Pathology

## 2016-07-12 ENCOUNTER — Encounter: Payer: Self-pay | Admitting: Speech Pathology

## 2016-07-12 DIAGNOSIS — R490 Dysphonia: Secondary | ICD-10-CM | POA: Diagnosis not present

## 2016-07-12 DIAGNOSIS — R262 Difficulty in walking, not elsewhere classified: Secondary | ICD-10-CM

## 2016-07-12 NOTE — Therapy (Addendum)
[place] MAIN [facility] SERVICES [address] [place], [place], [address] Phone: [phone]   Fax:  [phone]  Physical Therapy Treatment  Patient Details  Name: Stephanie [name] MRN: [id number] Date of Birth: 11/09/1945 Referring Provider: Vladimir Crofts   Encounter Date: 07/12/2016      PT End of Session - 07/12/16 1009    Visit Number 12   Number of Visits 17   Date for PT Re-Evaluation August 12, 2016   Authorization Type g codes   Authorization Time Period 2/10   PT Start Time 1000   PT Stop Time 1100   PT Time Calculation (min) 60 min   Equipment Utilized During Treatment Gait belt   Activity Tolerance Patient tolerated treatment well;Patient limited by pain;Patient limited by fatigue   Behavior During Therapy [facility] for tasks assessed/performed      Past Medical History:  Diagnosis Date  . Anemia   . Arthritis   . Chronic back pain   . Diabetes mellitus without complication ([place])   . Dysrhythmia   . GERD (gastroesophageal reflux disease)   . Hyperlipidemia   . Hypertension   . Hypothyroid   . Sleep apnea   . Syncope   . Tremors of nervous system     Past Surgical History:  Procedure Laterality Date  . ABDOMINAL HYSTERECTOMY    . BACK SURGERY  09/16  . CARDIAC CATHETERIZATION    . CATARACT EXTRACTION W/PHACO Right 11/24/2015   Procedure: CATARACT EXTRACTION PHACO AND INTRAOCULAR LENS PLACEMENT (IOC);  Surgeon: Estill Cotta, MD;  Location: [facility] ORS;  Service: Ophthalmology;  Laterality: Right;  [place] 01:14 AP% 25.5 CDE 30.69 fluid pack lot # [id number]  . COLONOSCOPY    . OOPHORECTOMY      There were no vitals filed for this visit.      Subjective Assessment - 07/12/16 1007    Subjective Patient is doing her LSVT BIG exercises at home. She reports that she is able to manage her dressing and hand tasks better.    Limitations Walking   Currently in Pain? No/denies      Patient seen for LSVT Daily Session Maximal  Daily Exercises for facilitation/coordination of movement Sustained movements are designed to rescale the amplitude of movement output for generalization to daily functional activities .Performed as follows for 1 set of 10 repetitions each multidirectional sustained movements 1) Floor to ceiling 2) Side to side multidirectional Repetitive movements performed in standing and are designed to provide retraining effort needed for sustained muscle activation in tasks   3) Step and reach forward 4) Step and reach backwards 5) Step and reach sideways 6) Rock and reach forward/backward 7) Rock and reach sideways Sit to stand functional component task with supervision 5 reps and simulated activities for:  Improved stepping on steps with ascending steps x 5 Coordination for fine motor control with pegs and card dealing for buttons and handling keys Scooting in and out with a chair at a table Working on reaching activities with LUE for pulling clothing out of the washer and dryer Ambulation with Big arm swing 1000 feet x 3 Patient has loss of balance and needs UE support intermittently thorough out exercise. Patient has slowness of movement during rotation and beginning movements.Patient has slowness of movement with LUE and has decreased coordination with LUE movement and decreased strength of LUE. She needs intermittent cuing to move her LUE correctly . Patient has a pulling pain in her LUE during exercises and the LUE  gets fatigued.                             PT Education - 07/12/16 1008    Education provided Yes   Education Details LSVT BIG    Person(s) Educated Patient   Methods Explanation   Comprehension Verbalized understanding;Returned demonstration;Verbal cues required             PT Long Term Goals - 06/14/16 1031      PT LONG TERM GOAL #1   Title Patient will be independent in home exercise program to improve strength/mobility for better functional independence  with ADLs.   Time 4   Period Weeks   Status New     PT LONG TERM GOAL #2   Title Patient (> 31 years old) will complete five times sit to stand test in < 15 seconds indicating an increased LE strength and improved balance.   Time 4   Period Weeks   Status New     PT LONG TERM GOAL #3   Title Patient will increase six minute walk test distance to >1000 for progression to community ambulator and improve gait ability   Time 4   Period Weeks   Status New     PT LONG TERM GOAL #4   Title Patient will increase 10 meter walk test to >1.51m/s as to improve gait speed for better community ambulation and to reduce fall risk.   Time 4   Period Weeks   Status New               Plan - 07/12/16 1009    Clinical Impression Statement  Decreased coordination demonstrated requiring consistent verbal cueing to correct form.   Rehab Potential Good   PT Frequency 4x / week   PT Duration 4 weeks   PT Treatment/Interventions Therapeutic exercise;Balance training;Neuromuscular re-education;Therapeutic activities;Gait training   PT Next Visit Plan LSVT BIG   PT Home Exercise Plan LSVT BIG   Consulted and Agree with Plan of Care Patient      Patient will benefit from skilled therapeutic intervention in order to improve the following deficits and impairments:  Abnormal gait, Decreased balance, Decreased mobility, Difficulty walking, Impaired sensation, Obesity, Pain, Impaired flexibility, Decreased strength, Decreased coordination, Decreased activity tolerance  Visit Diagnosis: Difficulty in walking, not elsewhere classified     Problem List Patient Active Problem List   Diagnosis Date Noted  . Hyponatremia 12/31/2015   Alanson Puls, PT, DPT [place], [place] S 07/12/2016, 10:11 AM  [place] MAIN [facility] SERVICES [address] [place], [place], [address] Phone: [phone]   Fax:  [phone]  Name: Stephanie [name] MRN:  [id number] Date of Birth: [birth year]

## 2016-07-12 NOTE — Patient Instructions (Signed)
LSVT BIG Program daily x 10 reps and 2 x day on days not in clinic

## 2016-07-12 NOTE — Therapy (Signed)
[place] MAIN [facility] SERVICES [address] [place], [place], [address] Phone: [phone]   Fax:  [phone]  Speech Language Pathology Treatment  Patient Details  Name: Stephanie [name] MRN: [id number] Date of Birth: September 21, 1946 Referring Provider: Dr. Manuella Ghazi  Encounter Date: 07/12/2016      End of Session - 07/12/16 1009    Visit Number 12   Number of Visits 17   Date for SLP Re-Evaluation 07/16/16   SLP Start Time 0850   SLP Stop Time  0950   SLP Time Calculation (min) 60 min   Activity Tolerance Patient tolerated treatment well      Past Medical History:  Diagnosis Date  . Anemia   . Arthritis   . Chronic back pain   . Diabetes mellitus without complication ([place])   . Dysrhythmia   . GERD (gastroesophageal reflux disease)   . Hyperlipidemia   . Hypertension   . Hypothyroid   . Sleep apnea   . Syncope   . Tremors of nervous system     Past Surgical History:  Procedure Laterality Date  . ABDOMINAL HYSTERECTOMY    . BACK SURGERY  09/16  . CARDIAC CATHETERIZATION    . CATARACT EXTRACTION W/PHACO Right 11/24/2015   Procedure: CATARACT EXTRACTION PHACO AND INTRAOCULAR LENS PLACEMENT (IOC);  Surgeon: Estill Cotta, MD;  Location: [facility] ORS;  Service: Ophthalmology;  Laterality: Right;  [place] 01:14 AP% 25.5 CDE 30.69 fluid pack lot # [id number]  . COLONOSCOPY    . OOPHORECTOMY      There were no vitals filed for this visit.      Subjective Assessment - 07/12/16 1009    Subjective The patient explained that she was not feeling her best today - nausea and fatigue. Nevertheless, she maintain appropriate volume and good vocal quality during spontaneous greetings and throughout the session. The patient also reported using LOUD during communication over the weekend, including social events and an extended phone call. A conversational partner is reported to have told her that she "sound(ed) good to me!"   Currently in Pain?  No/denies               ADULT SLP TREATMENT - 07/12/16 0001      General Information   Behavior/Cognition Alert;Pleasant mood;Cooperative     Treatment Provided   Treatment provided Cognitive-Linquistic     Pain Assessment   Pain Assessment No/denies pain     Cognitive-Linquistic Treatment   Treatment focused on Voice   Skilled Treatment Daily Task #1 (Maximum sustained "ah"): Average 15 seconds, 86 dB given min cues. Daily Task 2 (Maximum fundamental frequency range): Highs: 15 high pitched "ah" given no cues. Lows: 15 low pitched "ah" given no cues. Daily task #3 (Maximum speech loudness drill of functional phrases): Average 76 dB given min cues.  Hierarchal speech loudness drill: Read paragraphs, 74 dB given min cues. Homework: assignments completed.  Off the cuff remarks: average 72 dB.     Assessment / Recommendations / Plan   Plan Continue with current plan of care     Progression Toward Goals   Progression toward goals Progressing toward goals          SLP Education - 07/12/16 1009    Education provided Yes   Education Details LSVT-LOUD   Person(s) Educated Patient   Methods Explanation   Comprehension Verbalized understanding            SLP Long Term Goals - 07/07/16 1235  SLP LONG TERM GOAL #1   Title The patient will complete Daily Tasks (Maximum duration "ah", High/Lows, and Functional Phrases) at average loudness of 80 dB and with loud, good quality voice.    Time 4   Period Weeks   Status Partially Met     SLP LONG TERM GOAL #2   Title The patient will complete Hierarchal Speech Loudness reading drills (words/phrases, sentences, and paragraph) at average 75 dB and with loud, good quality voice.     Time 4   Period Weeks   Status Partially Met     SLP LONG TERM GOAL #3   Title The patient will participate in conversation, maintaining average loudness of 75 dB and loud, good quality voice.   Time 4   Period Weeks   Status Partially Met      SLP LONG TERM GOAL #4   Title The patient will complete homework daily.   Time 4   Period Weeks   Status On-going          Plan - 07/12/16 1011    Clinical Impression Statement The patient is completing daily tasks and hierarchal speech drill tasks with loud, good quality voice.  She is demonstrating generalization into conversational speech, even on days when she has less energy than usual. The patient independently identifies opportunities to use LOUD outside of therapy.    Speech Therapy Frequency 4x / week   Duration 4 weeks   Treatment/Interventions Other (comment)  LSVT-LOUD   Potential to Achieve Goals Good   Potential Considerations Cooperation/participation level;Ability to learn/carryover information;Family/community support   SLP Home Exercise Plan LSVT-LOUD daily homework. Carryover assignment: conversation at lunch with niece and sister.    Consulted and Agree with Plan of Care Patient      Patient will benefit from skilled therapeutic intervention in order to improve the following deficits and impairments:   Dysphonia    Problem List Patient Active Problem List   Diagnosis Date Noted  . Hyponatremia 12/31/2015    Rickard Rhymes  Graduate Student Clinician 07/12/2016, 10:13 AM  [place] MAIN [facility] SERVICES [address] [place], [place], [address] Phone: [phone]   Fax:  [phone]   Name: Stephanie [name] MRN: [id number] Date of Birth: [birth year]

## 2016-07-13 ENCOUNTER — Encounter: Payer: Self-pay | Admitting: Physical Therapy

## 2016-07-13 ENCOUNTER — Ambulatory Visit: Payer: Medicare Other | Admitting: Physical Therapy

## 2016-07-13 ENCOUNTER — Encounter: Payer: Self-pay | Admitting: Speech Pathology

## 2016-07-13 ENCOUNTER — Ambulatory Visit: Payer: Medicare Other | Admitting: Speech Pathology

## 2016-07-13 DIAGNOSIS — R490 Dysphonia: Secondary | ICD-10-CM

## 2016-07-13 DIAGNOSIS — R262 Difficulty in walking, not elsewhere classified: Secondary | ICD-10-CM

## 2016-07-13 NOTE — Therapy (Addendum)
[place] MAIN [facility] SERVICES [address] [place], [place], [address] Phone: [phone]   Fax:  [phone]  Physical Therapy Treatment  Patient Details  Name: Stephanie [name] MRN: [id number] Date of Birth: 06/21/1946 Referring Provider: Jennings Books K   Encounter Date: 07/13/2016      PT End of Session - 07/13/16 1010    Visit Number 13   Number of Visits 17   Date for PT Re-Evaluation 2016-07-31   Authorization Type g codes   Authorization Time Period 3/10   PT Start Time 1000   PT Stop Time 1100   PT Time Calculation (min) 60 min   Equipment Utilized During Treatment Gait belt   Activity Tolerance Patient tolerated treatment well   Behavior During Therapy [facility] for tasks assessed/performed      Past Medical History:  Diagnosis Date  . Anemia   . Arthritis   . Chronic back pain   . Diabetes mellitus without complication ([place])   . Dysrhythmia   . GERD (gastroesophageal reflux disease)   . Hyperlipidemia   . Hypertension   . Hypothyroid   . Sleep apnea   . Syncope   . Tremors of nervous system     Past Surgical History:  Procedure Laterality Date  . ABDOMINAL HYSTERECTOMY    . BACK SURGERY  09/16  . CARDIAC CATHETERIZATION    . CATARACT EXTRACTION W/PHACO Right 11/24/2015   Procedure: CATARACT EXTRACTION PHACO AND INTRAOCULAR LENS PLACEMENT (IOC);  Surgeon: Estill Cotta, MD;  Location: [facility] ORS;  Service: Ophthalmology;  Laterality: Right;  [place] 01:14 AP% 25.5 CDE 30.69 fluid pack lot # [id number]  . COLONOSCOPY    . OOPHORECTOMY      There were no vitals filed for this visit.      Subjective Assessment - 07/13/16 1008    Subjective Patient has mild pain in her neck today from the way she slept.    Limitations Walking   Currently in Pain? Yes   Pain Score 7    Pain Location Neck   Pain Descriptors / Indicators Sore   Pain Type Acute pain   Pain Onset Today   Pain Frequency Rarely   Multiple Pain Sites  No      Patient seen for LSVT Daily Session Maximal Daily Exercises for facilitation/coordination of movement Sustained movements are designed to rescale the amplitude of movement output for generalization to daily functional activities .Performed as follows for 1 set of 10 repetitions each multidirectional sustained movements 1) Floor to ceiling 2) Side to side multidirectional Repetitive movements performed in standing and are designed to provide retraining effort needed for sustained muscle activation in tasks   3) Step and reach forward 4) Step and reach backwards 5) Step and reach sideways 6) Rock and reach forward/backward 7) Rock and reach sideways Sit to stand functional component task with supervision 5 reps and simulated activities for:  Improved stepping on steps with ascending steps x 5 Coordination for fine motor control with pegs and card dealing for buttons and handling keys Scooting in and out with a chair at a table Working on reaching activities with LUE for pulling clothing out of the washer and dryer Ambulation with Big arm swing 1000 feet x 3  Continues to have balance deficits typical with diagnosis. Patient performs intermediate level exercises without pain behaviors and needs verbal cuing for postural alignment and head positioning. Patient has slowness of movement with LUE and has decreased coordination with LUE  movement and decreased strength of LUE. She needs intermittent cuing to move her LUE correctly . Patient has a pulling pain in her LUE during exercises and the LUE gets fatigued.                            PT Education - 07/13/16 1009    Education provided Yes   Education Details LSVT BIG   Person(s) Educated Patient   Methods Explanation   Comprehension Verbalized understanding             PT Long Term Goals - 06/14/16 1031      PT LONG TERM GOAL #1   Title Patient will be independent in home exercise program to improve  strength/mobility for better functional independence with ADLs.   Time 4   Period Weeks   Status New     PT LONG TERM GOAL #2   Title Patient (> 60 years old) will complete five times sit to stand test in < 15 seconds indicating an increased LE strength and improved balance.   Time 4   Period Weeks   Status New     PT LONG TERM GOAL #3   Title Patient will increase six minute walk test distance to >1000 for progression to community ambulator and improve gait ability   Time 4   Period Weeks   Status New     PT LONG TERM GOAL #4   Title Patient will increase 10 meter walk test to >1.67m/s as to improve gait speed for better community ambulation and to reduce fall risk.   Time 4   Period Weeks   Status New               Plan - 07/13/16 1010    Clinical Impression Statement Min cuing needed to maintain posture while performing LSVT tasks. Good form was demonstrated throughout exercises.   Rehab Potential Good   PT Frequency 4x / week   PT Duration 4 weeks   PT Treatment/Interventions Therapeutic exercise;Balance training;Neuromuscular re-education;Therapeutic activities;Gait training   PT Next Visit Plan LSVT BIG   PT Home Exercise Plan LSVT BIG   Consulted and Agree with Plan of Care Patient      Patient will benefit from skilled therapeutic intervention in order to improve the following deficits and impairments:  Abnormal gait, Decreased balance, Decreased mobility, Difficulty walking, Impaired sensation, Obesity, Pain, Impaired flexibility, Decreased strength, Decreased coordination, Decreased activity tolerance  Visit Diagnosis: Difficulty in walking, not elsewhere classified     Problem List Patient Active Problem List   Diagnosis Date Noted  . Hyponatremia 12/31/2015   Alanson Puls, PT, DPT [place], [place] S 07/13/2016, 10:12 AM  [place] MAIN [facility] SERVICES [address] [place], [place],  [address] Phone: [phone]   Fax:  [phone]  Name: Stephanie [name] MRN: [id number] Date of Birth: Jan 09, 1946

## 2016-07-13 NOTE — Therapy (Signed)
[place] MAIN [facility] SERVICES [address] [place], [place], [address] Phone: [phone]   Fax:  [phone]  Speech Language Pathology Treatment  Patient Details  Name: Stephanie [name] MRN: [id number] Date of Birth: 1945-11-08 Referring Provider: Dr. Manuella Ghazi  Encounter Date: 07/13/2016      End of Session - 07/13/16 1437    Visit Number 13   Number of Visits 17   Date for SLP Re-Evaluation 07/16/16   SLP Start Time 0900   SLP Stop Time  1000   SLP Time Calculation (min) 60 min      Past Medical History:  Diagnosis Date  . Anemia   . Arthritis   . Chronic back pain   . Diabetes mellitus without complication ([place])   . Dysrhythmia   . GERD (gastroesophageal reflux disease)   . Hyperlipidemia   . Hypertension   . Hypothyroid   . Sleep apnea   . Syncope   . Tremors of nervous system     Past Surgical History:  Procedure Laterality Date  . ABDOMINAL HYSTERECTOMY    . BACK SURGERY  09/16  . CARDIAC CATHETERIZATION    . CATARACT EXTRACTION W/PHACO Right 11/24/2015   Procedure: CATARACT EXTRACTION PHACO AND INTRAOCULAR LENS PLACEMENT (IOC);  Surgeon: Estill Cotta, MD;  Location: [facility] ORS;  Service: Ophthalmology;  Laterality: Right;  [place] 01:14 AP% 25.5 CDE 30.69 fluid pack lot # [id number]  . COLONOSCOPY    . OOPHORECTOMY      There were no vitals filed for this visit.      Subjective Assessment - 07/13/16 1436    Subjective The patient is using her loud voice in the hall with therapist               ADULT SLP TREATMENT - 07/13/16 0001      General Information   Behavior/Cognition Alert;Pleasant mood;Cooperative     Treatment Provided   Treatment provided Cognitive-Linquistic     Pain Assessment   Pain Assessment No/denies pain     Cognitive-Linquistic Treatment   Treatment focused on Voice   Skilled Treatment Daily Task #1 (Maximum sustained "ah"): Average 17 seconds, 85 dB. Daily Task 2 (Maximum  fundamental frequency range): Highs: 15 high pitched "ah" given no cues. Lows: 15 low pitched "ah" given no cues. Daily task #3 (Maximum speech loudness drill of functional phrases): Average 75 dB.  Hierarchal speech loudness drill: Read paragraphs, 75 dB. Homework: assignments completed.  Off the cuff remarks: average 73 dB.     Assessment / Recommendations / Plan   Plan Continue with current plan of care     Progression Toward Goals   Progression toward goals Progressing toward goals          SLP Education - 07/13/16 1437    Education provided Yes   Education Details LSVT-LOUD   Person(s) Educated Patient   Methods Explanation   Comprehension Verbalized understanding            SLP Long Term Goals - 07/07/16 1235      SLP LONG TERM GOAL #1   Title The patient will complete Daily Tasks (Maximum duration "ah", High/Lows, and Functional Phrases) at average loudness of 80 dB and with loud, good quality voice.    Time 4   Period Weeks   Status Partially Met     SLP LONG TERM GOAL #2   Title The patient will complete Hierarchal Speech Loudness reading drills (words/phrases, sentences, and paragraph) at  average 75 dB and with loud, good quality voice.     Time 4   Period Weeks   Status Partially Met     SLP LONG TERM GOAL #3   Title The patient will participate in conversation, maintaining average loudness of 75 dB and loud, good quality voice.   Time 4   Period Weeks   Status Partially Met     SLP LONG TERM GOAL #4   Title The patient will complete homework daily.   Time 4   Period Weeks   Status On-going          Plan - 07/13/16 1438    Clinical Impression Statement The patient is completing daily tasks and hierarchal speech drill tasks with loud, good quality voice.  She is demonstrating generalization into conversational speech, even on days when she has less energy than usual. The patient independently identifies opportunities to use LOUD outside of therapy.     Speech Therapy Frequency 4x / week   Duration 4 weeks   Treatment/Interventions Other (comment)  LSVT-LOUD   Potential to Achieve Goals Good   Potential Considerations Cooperation/participation level;Ability to learn/carryover information;Family/community support   SLP Home Exercise Plan LSVT-LOUD daily homework. Carryover assignment: conversation at lunch with niece and sister.    Consulted and Agree with Plan of Care Patient      Patient will benefit from skilled therapeutic intervention in order to improve the following deficits and impairments:   Dysphonia    Problem List Patient Active Problem List   Diagnosis Date Noted  . Hyponatremia 12/31/2015   Stephanie Sea, MS/CCC- SLP  Stephanie [name] 07/13/2016, 2:39 PM  [place] MAIN [facility] SERVICES [address] [place], [place], [address] Phone: [phone]   Fax:  [phone]   Name: Stephanie [name] MRN: [id number] Date of Birth: [birth year]

## 2016-07-14 ENCOUNTER — Ambulatory Visit: Payer: Medicare Other | Admitting: Physical Therapy

## 2016-07-14 ENCOUNTER — Encounter: Payer: Self-pay | Admitting: Physical Therapy

## 2016-07-14 ENCOUNTER — Encounter: Payer: Self-pay | Admitting: Speech Pathology

## 2016-07-14 ENCOUNTER — Ambulatory Visit: Payer: Medicare Other | Admitting: Speech Pathology

## 2016-07-14 DIAGNOSIS — R262 Difficulty in walking, not elsewhere classified: Secondary | ICD-10-CM

## 2016-07-14 DIAGNOSIS — R490 Dysphonia: Secondary | ICD-10-CM

## 2016-07-14 NOTE — Therapy (Addendum)
[place] MAIN [facility] SERVICES [address] [place], [place], [address] Phone: [phone]   Fax:  [phone]  Physical Therapy Treatment  Patient Details  Name: ADALEA ZBOROWSKI MRN: [id number] Date of Birth: [birth year] Referring Provider: Vladimir Crofts   Encounter Date: 07/14/2016      PT End of Session - 07/14/16 1009    Visit Number 14   Number of Visits 17   Date for PT Re-Evaluation 08/14/2016   Authorization Type g codes   Authorization Time Period 4/10   PT Start Time 1015   PT Stop Time 1110   PT Time Calculation (min) 55 min   Equipment Utilized During Treatment Gait belt   Activity Tolerance Patient tolerated treatment well   Behavior During Therapy [facility] for tasks assessed/performed      Past Medical History:  Diagnosis Date  . Anemia   . Arthritis   . Chronic back pain   . Diabetes mellitus without complication ([place])   . Dysrhythmia   . GERD (gastroesophageal reflux disease)   . Hyperlipidemia   . Hypertension   . Hypothyroid   . Sleep apnea   . Syncope   . Tremors of nervous system     Past Surgical History:  Procedure Laterality Date  . ABDOMINAL HYSTERECTOMY    . BACK SURGERY  09/16  . CARDIAC CATHETERIZATION    . CATARACT EXTRACTION W/PHACO Right 11/24/2015   Procedure: CATARACT EXTRACTION PHACO AND INTRAOCULAR LENS PLACEMENT (IOC);  Surgeon: Estill Cotta, MD;  Location: [facility] ORS;  Service: Ophthalmology;  Laterality: Right;  [place] 01:14 AP% 25.5 CDE 30.69 fluid pack lot # [id number]  . COLONOSCOPY    . OOPHORECTOMY      There were no vitals filed for this visit.      Subjective Assessment - 07/14/16 1008    Subjective Patient has mild pain in her neck today from the way she slept.    Limitations Walking   How long can you sit comfortably? yes   How long can you stand comfortably? 10 mintues   How long can you walk comfortably? a few minutes   Patient Stated Goals to be able to walk longer    Currently in Pain? No/denies   Pain Score 0-No pain   Multiple Pain Sites No        Patient seen for LSVT Daily Session Maximal Daily Exercises for facilitation/coordination of movement Sustained movements are designed to rescale the amplitude of movement output for generalization to daily functional activities .Performed as follows for 1 set of 10 repetitions each multidirectional sustained movements 1) Floor to ceiling 2) Side to side multidirectional Repetitive movements performed in standing and are designed to provide retraining effort needed for sustained muscle activation in tasks   3) Step and reach forward 4) Step and reach backwards 5) Step and reach sideways 6) Rock and reach forward/backward 7) Rock and reach sideways Sit to stand functional component task with supervision 5 reps and simulated activities for:  Improved stepping on steps with ascending steps x 5 Coordination for fine motor control with pegs and card dealing for buttons and handling keys Scooting in and out with a chair at a table Working on reaching activities with LUE for pulling clothing out of the washer and dryer Ambulation with Big arm swing 1000 feet x 3 Patient has slowness of movement with LUE and has decreased coordination with LUE movement and decreased strength of LUE. She needs intermittent cuing to move  her LUE correctly . Patient has a pulling pain in her LUE during exercises and the LUE gets fatigued.                           PT Education - 07/14/16 1008    Education provided Yes   Education Details LSVT BIG   Person(s) Educated Patient   Methods Explanation   Comprehension Verbalized understanding             PT Long Term Goals - 06/14/16 1031      PT LONG TERM GOAL #1   Title Patient will be independent in home exercise program to improve strength/mobility for better functional independence with ADLs.   Time 4   Period Weeks   Status New     PT LONG TERM GOAL #2    Title Patient (70 years old) will complete five times sit to stand test in < 15 seconds indicating an increased LE strength and improved balance.   Time 4   Period Weeks   Status New     PT LONG TERM GOAL #3   Title Patient will increase six minute walk test distance to >1000 for progression to community ambulator and improve gait ability   Time 4   Period Weeks   Status New     PT LONG TERM GOAL #4   Title Patient will increase 10 meter walk test to >1.41m/s as to improve gait speed for better community ambulation and to reduce fall risk.   Time 4   Period Weeks   Status New               Plan - 07/14/16 1009    Clinical Impression Statement LUE hand has decreased coordination, tremor and decreased strength and needs frequent rests to perform tasks.    Rehab Potential Good   PT Frequency 4x / week   PT Duration 4 weeks   PT Treatment/Interventions Therapeutic exercise;Balance training;Neuromuscular re-education;Therapeutic activities;Gait training   PT Next Visit Plan LSVT BIG   PT Home Exercise Plan LSVT BIG   Consulted and Agree with Plan of Care Patient      Patient will benefit from skilled therapeutic intervention in order to improve the following deficits and impairments:  Abnormal gait, Decreased balance, Decreased mobility, Difficulty walking, Impaired sensation, Obesity, Pain, Impaired flexibility, Decreased strength, Decreased coordination, Decreased activity tolerance  Visit Diagnosis: Difficulty in walking, not elsewhere classified     Problem List Patient Active Problem List   Diagnosis Date Noted  . Hyponatremia 12/31/2015   Alanson Puls, PT, DPT [place], Minette Headland S 07/14/2016, 10:31 AM  [place] MAIN [facility] SERVICES [address] [place], [place], [address] Phone: [phone]   Fax:  [phone]  Name: QUINTON OVERBECK MRN: [id number] Date of Birth: 1946/09/23

## 2016-07-14 NOTE — Therapy (Signed)
[place] MAIN [facility] SERVICES [address] [place], [place], [address] Phone: [phone]   Fax:  [phone]  Speech Language Pathology Treatment  Patient Details  Name: Stephanie [name] MRN: [id number] Date of Birth: April 19, 1946 Referring Provider: Dr. Manuella Ghazi  Encounter Date: 07/14/2016      End of Session - 07/14/16 1043    Visit Number 14   Number of Visits 17   Date for SLP Re-Evaluation 07/16/16   SLP Start Time 0900   SLP Stop Time  0951   SLP Time Calculation (min) 51 min   Activity Tolerance Patient tolerated treatment well      Past Medical History:  Diagnosis Date  . Anemia   . Arthritis   . Chronic back pain   . Diabetes mellitus without complication ([place])   . Dysrhythmia   . GERD (gastroesophageal reflux disease)   . Hyperlipidemia   . Hypertension   . Hypothyroid   . Sleep apnea   . Syncope   . Tremors of nervous system     Past Surgical History:  Procedure Laterality Date  . ABDOMINAL HYSTERECTOMY    . BACK SURGERY  09/16  . CARDIAC CATHETERIZATION    . CATARACT EXTRACTION W/PHACO Right 11/24/2015   Procedure: CATARACT EXTRACTION PHACO AND INTRAOCULAR LENS PLACEMENT (IOC);  Surgeon: Estill Cotta, MD;  Location: [facility] ORS;  Service: Ophthalmology;  Laterality: Right;  [place] 01:14 AP% 25.5 CDE 30.69 fluid pack lot # [id number]  . COLONOSCOPY    . OOPHORECTOMY      There were no vitals filed for this visit.      Subjective Assessment - 07/14/16 1043    Subjective The patient says she "always thinks about" using her LOUD voice.    Currently in Pain? No/denies               ADULT SLP TREATMENT - 07/14/16 0001      General Information   Behavior/Cognition Alert;Pleasant mood;Cooperative     Treatment Provided   Treatment provided Cognitive-Linquistic     Pain Assessment   Pain Assessment No/denies pain     Cognitive-Linquistic Treatment   Treatment focused on Voice   Skilled Treatment  Daily Task #1 (Maximum sustained "ah"): Average 15 seconds, 88 dB given no cues for loudness. Daily Task 2 (Maximum fundamental frequency range): Highs: 15 high pitched "ah" given no cues. Lows: 15 low pitched "ah" given no cues. Daily task #3 (Maximum speech loudness drill of functional phrases): Average 75 dB given no cues.  Hierarchal speech loudness drill: Read paragraphs, 75 dB given no cues. Homework: assignments completed.  Off the cuff remarks: average 75 dB.     Assessment / Recommendations / Plan   Plan Continue with current plan of care     Progression Toward Goals   Progression toward goals Progressing toward goals          SLP Education - 07/14/16 1043    Education provided Yes   Education Details LSVT-LOUD   Person(s) Educated Patient   Methods Explanation   Comprehension Verbalized understanding            SLP Long Term Goals - 07/07/16 1235      SLP LONG TERM GOAL #1   Title The patient will complete Daily Tasks (Maximum duration "ah", High/Lows, and Functional Phrases) at average loudness of 80 dB and with loud, good quality voice.    Time 4   Period Weeks   Status Partially Met  SLP LONG TERM GOAL #2   Title The patient will complete Hierarchal Speech Loudness reading drills (words/phrases, sentences, and paragraph) at average 75 dB and with loud, good quality voice.     Time 4   Period Weeks   Status Partially Met     SLP LONG TERM GOAL #3   Title The patient will participate in conversation, maintaining average loudness of 75 dB and loud, good quality voice.   Time 4   Period Weeks   Status Partially Met     SLP LONG TERM GOAL #4   Title The patient will complete homework daily.   Time 4   Period Weeks   Status On-going          Plan - 07/14/16 1043    Clinical Impression Statement The patient is completing daily tasks and hierarchal speech drill tasks with loud, good quality voice.  Today she required no cues to maintain loudness and  good vocal quality throughout all exercises and spontaneous conversation. She integrates LOUD voice into conversation and independently identifies opportunities to use LOUD outside of therapy.   Speech Therapy Frequency 4x / week   Duration 4 weeks   Treatment/Interventions Other (comment)  LSVT-LOUD   Potential to Achieve Goals Good   Potential Considerations Cooperation/participation level;Ability to learn/carryover information;Family/community support   SLP Home Exercise Plan LSVT-LOUD daily exercises   Consulted and Agree with Plan of Care Patient      Patient will benefit from skilled therapeutic intervention in order to improve the following deficits and impairments:   Dysphonia    Problem List Patient Active Problem List   Diagnosis Date Noted  . Hyponatremia 12/31/2015    Rickard Rhymes  Graduate Student Clinician 07/14/2016, 10:45 AM  [place] MAIN [facility] SERVICES [address] [place], [place], [address] Phone: [phone]   Fax:  [phone]   Name: Stephanie [name] MRN: [id number] Date of Birth: Mar 19, 1946

## 2016-07-15 ENCOUNTER — Encounter: Payer: Self-pay | Admitting: Speech Pathology

## 2016-07-15 ENCOUNTER — Ambulatory Visit: Payer: Medicare Other | Admitting: Speech Pathology

## 2016-07-15 DIAGNOSIS — R490 Dysphonia: Secondary | ICD-10-CM | POA: Diagnosis not present

## 2016-07-15 NOTE — Therapy (Signed)
[place] MAIN [facility] SERVICES [address] [place], [place], [address] Phone: [phone]   Fax:  [phone]  Speech Language Pathology Treatment  Patient Details  Name: Stephanie [name] MRN: [id number] Date of Birth: 1946-02-04 Referring Provider: Dr. Manuella Ghazi  Encounter Date: 07/15/2016      End of Session - 07/15/16 0951    Visit Number 15   Number of Visits 17   Date for SLP Re-Evaluation 07/16/16   SLP Start Time 0850   SLP Stop Time  0945   SLP Time Calculation (min) 55 min   Activity Tolerance Patient tolerated treatment well      Past Medical History:  Diagnosis Date  . Anemia   . Arthritis   . Chronic back pain   . Diabetes mellitus without complication ([place])   . Dysrhythmia   . GERD (gastroesophageal reflux disease)   . Hyperlipidemia   . Hypertension   . Hypothyroid   . Sleep apnea   . Syncope   . Tremors of nervous system     Past Surgical History:  Procedure Laterality Date  . ABDOMINAL HYSTERECTOMY    . BACK SURGERY  09/16  . CARDIAC CATHETERIZATION    . CATARACT EXTRACTION W/PHACO Right 11/24/2015   Procedure: CATARACT EXTRACTION PHACO AND INTRAOCULAR LENS PLACEMENT (IOC);  Surgeon: Estill Cotta, MD;  Location: [facility] ORS;  Service: Ophthalmology;  Laterality: Right;  [place] 01:14 AP% 25.5 CDE 30.69 fluid pack lot # [id number]  . COLONOSCOPY    . OOPHORECTOMY      There were no vitals filed for this visit.      Subjective Assessment - 07/15/16 0950    Subjective The patient reports using her loud voice while on the phone with her insurance company   Currently in Pain? No/denies               ADULT SLP TREATMENT - 07/15/16 0001      General Information   Behavior/Cognition Alert;Pleasant mood;Cooperative     Treatment Provided   Treatment provided Cognitive-Linquistic     Pain Assessment   Pain Assessment No/denies pain     Cognitive-Linquistic Treatment   Treatment focused on Voice    Skilled Treatment Daily Task #1 (Maximum sustained "ah"): Average 15 seconds, 88 dB given no cues for loudness. Daily Task 2 (Maximum fundamental frequency range): Highs: 15 high pitched "ah" given no cues. Lows: 15 low pitched "ah" given no cues. Daily task #3 (Maximum speech loudness drill of functional phrases): Average 75 dB given no cues.  Hierarchal speech loudness drill: Read paragraphs, 75 dB given no cues. Homework: assignments completed.  Off the cuff remarks: average 75 dB.     Assessment / Recommendations / Plan   Plan Continue with current plan of care     Progression Toward Goals   Progression toward goals Progressing toward goals          SLP Education - 07/15/16 0951    Education provided Yes   Education Details LSVT-LOUD   Person(s) Educated Patient   Methods Explanation   Comprehension Verbalized understanding            SLP Long Term Goals - 07/07/16 1235      SLP LONG TERM GOAL #1   Title The patient will complete Daily Tasks (Maximum duration "ah", High/Lows, and Functional Phrases) at average loudness of 80 dB and with loud, good quality voice.    Time 4   Period Weeks   Status  Partially Met     SLP LONG TERM GOAL #2   Title The patient will complete Hierarchal Speech Loudness reading drills (words/phrases, sentences, and paragraph) at average 75 dB and with loud, good quality voice.     Time 4   Period Weeks   Status Partially Met     SLP LONG TERM GOAL #3   Title The patient will participate in conversation, maintaining average loudness of 75 dB and loud, good quality voice.   Time 4   Period Weeks   Status Partially Met     SLP LONG TERM GOAL #4   Title The patient will complete homework daily.   Time 4   Period Weeks   Status On-going          Plan - 07/15/16 1980    Clinical Impression Statement The patient is completing daily tasks and hierarchal speech drill tasks with loud, good quality voice.  Today she required no cues to  maintain loudness and good vocal quality throughout all exercises and spontaneous conversation. She integrates LOUD voice into conversation and independently identifies opportunities to use LOUD outside of therapy.   Speech Therapy Frequency 4x / week   Duration 4 weeks   Treatment/Interventions Other (comment)  LSVT-LOUD   Potential to Achieve Goals Good   Potential Considerations Cooperation/participation level;Ability to learn/carryover information;Family/community support   SLP Home Exercise Plan LSVT-LOUD daily exercises   Consulted and Agree with Plan of Care Patient      Patient will benefit from skilled therapeutic intervention in order to improve the following deficits and impairments:   Dysphonia    Problem List Patient Active Problem List   Diagnosis Date Noted  . Hyponatremia 12/31/2015   [name], MS/CCC- SLP  Lou Miner 07/15/2016, 9:53 AM  [place] MAIN [facility] SERVICES [address] [place], [place], [address] Phone: [phone]   Fax:  [phone]   Name: Stephanie [name] MRN: [id number] Date of Birth: 1946/10/21

## 2016-07-19 ENCOUNTER — Ambulatory Visit: Payer: Medicare Other | Admitting: Speech Pathology

## 2016-07-19 ENCOUNTER — Ambulatory Visit: Payer: Medicare Other | Admitting: Physical Therapy

## 2016-07-19 ENCOUNTER — Encounter: Payer: Self-pay | Admitting: Physical Therapy

## 2016-07-19 ENCOUNTER — Encounter: Payer: Self-pay | Admitting: Speech Pathology

## 2016-07-19 DIAGNOSIS — R262 Difficulty in walking, not elsewhere classified: Secondary | ICD-10-CM

## 2016-07-19 DIAGNOSIS — R490 Dysphonia: Secondary | ICD-10-CM

## 2016-07-19 NOTE — Therapy (Signed)
[place] MAIN [facility] SERVICES [address] [place], [place], [address] Phone: [phone]   Fax:  [phone]  Physical Therapy Treatment  Patient Details  Name: Stephanie [name] MRN: [id number] Date of Birth: [birth year] Referring Provider: Jennings Books K   Encounter Date: 2016-07-29      PT End of Session - 07-29-2016 1006    Visit Number 15   Number of Visits 17   Date for PT Re-Evaluation Jul 29, 2016   Authorization Type g codes   Authorization Time Period 5/10   PT Start Time 1000   PT Stop Time 1100   PT Time Calculation (min) 60 min   Equipment Utilized During Treatment Gait belt   Activity Tolerance Patient tolerated treatment well   Behavior During Therapy [facility] for tasks assessed/performed      Past Medical History:  Diagnosis Date  . Anemia   . Arthritis   . Chronic back pain   . Diabetes mellitus without complication ([place])   . Dysrhythmia   . GERD (gastroesophageal reflux disease)   . Hyperlipidemia   . Hypertension   . Hypothyroid   . Sleep apnea   . Syncope   . Tremors of nervous system     Past Surgical History:  Procedure Laterality Date  . ABDOMINAL HYSTERECTOMY    . BACK SURGERY  09/16  . CARDIAC CATHETERIZATION    . CATARACT EXTRACTION W/PHACO Right 11/24/2015   Procedure: CATARACT EXTRACTION PHACO AND INTRAOCULAR LENS PLACEMENT (IOC);  Surgeon: Estill Cotta, MD;  Location: [facility] ORS;  Service: Ophthalmology;  Laterality: Right;  [place] 01:14 AP% 25.5 CDE 30.69 fluid pack lot # [id number]  . COLONOSCOPY    . OOPHORECTOMY      There were no vitals filed for this visit.      Subjective Assessment - 29-Jul-2016 1005    Subjective Patient says that her eyes are a little blurry today from the computer.    Limitations Walking   How long can you sit comfortably? yes   How long can you stand comfortably? 10 mintues   How long can you walk comfortably? a few minutes   Patient Stated Goals to be able to walk  longer   Currently in Pain? Yes   Pain Score 0-No pain   Pain Onset Today   Multiple Pain Sites No      Patient seen for LSVT Daily Session Maximal Daily Exercises for facilitation/coordination of movement Sustained movements are designed to rescale the amplitude of movement output for generalization to daily functional activities .Performed as follows for 1 set of 10 repetitions each multidirectional sustained movements 1) Floor to ceiling 2) Side to side multidirectional Repetitive movements performed in standing and are designed to provide retraining effort needed for sustained muscle activation in tasks   3) Step and reach forward 4) Step and reach backwards 5) Step and reach sideways 6) Rock and reach forward/backward 7) Rock and reach sideways Sit to stand functional component task with supervision 5 reps and simulated activities for:  Improved stepping on steps with ascending steps x 5 Coordination for fine motor control with pegs and card dealing for buttons and handling keys Scooting in and out with a chair at a table Working on reaching activities with LUE for pulling clothing out of the washer and dryer Ambulation with Big arm swing 1000 feet x 3 Patient has slowness of movement with LUE and has decreased coordination with LUE movement and decreased strength of LUE. She needs intermittent  cuing to move her LUE correctly. Patient has a pulling pain in her LUE during exercises and the LUE gets fatigued.                           PT Education - 07/19/16 1006    Education provided Yes   Education Details LSVT BIG   Person(s) Educated Patient   Methods Explanation   Comprehension Verbalized understanding             PT Long Term Goals - 06/14/16 1031      PT LONG TERM GOAL #1   Title Patient will be independent in home exercise program to improve strength/mobility for better functional independence with ADLs.   Time 4   Period Weeks   Status New      PT LONG TERM GOAL #2   Title Patient (> 32 years old) will complete five times sit to stand test in < 15 seconds indicating an increased LE strength and improved balance.   Time 4   Period Weeks   Status New     PT LONG TERM GOAL #3   Title Patient will increase six minute walk test distance to >1000 for progression to community ambulator and improve gait ability   Time 4   Period Weeks   Status New     PT LONG TERM GOAL #4   Title Patient will increase 10 meter walk test to >1.31m/s as to improve gait speed for better community ambulation and to reduce fall risk.   Time 4   Period Weeks   Status New               Plan - 07/19/16 1007    Clinical Impression Statement Min cueing needed to appropriately perform LSVT tasks with leg, hand, and head position. Decreased coordination demonstrated requiring consistent verbal cueing to correct form   Rehab Potential Good   PT Frequency 4x / week   PT Duration 4 weeks   PT Treatment/Interventions Therapeutic exercise;Balance training;Neuromuscular re-education;Therapeutic activities;Gait training   PT Next Visit Plan LSVT BIG   PT Home Exercise Plan LSVT BIG   Consulted and Agree with Plan of Care Patient      Patient will benefit from skilled therapeutic intervention in order to improve the following deficits and impairments:  Abnormal gait, Decreased balance, Decreased mobility, Difficulty walking, Impaired sensation, Obesity, Pain, Impaired flexibility, Decreased strength, Decreased coordination, Decreased activity tolerance  Visit Diagnosis: Difficulty in walking, not elsewhere classified     Problem List Patient Active Problem List   Diagnosis Date Noted  . Hyponatremia 12/31/2015  Alanson Puls, PT, DPT  [place], Minette Headland S 07/19/2016, 10:10 AM  [place] MAIN [facility] SERVICES [address] [place], [place], [address] Phone: [phone]   Fax:  [phone]  Name:  Stephanie [name] MRN: [id number] Date of Birth: Jul 30, 1946

## 2016-07-19 NOTE — Therapy (Signed)
[place] MAIN [facility] SERVICES [address] [place], [place], [address] Phone: [phone]   Fax:  [phone]  Speech Language Pathology Treatment  Patient Details  Name: Stephanie [name] MRN: [id number] Date of Birth: 1946/01/02 Referring Provider: Dr. Manuella Ghazi  Encounter Date: 07/19/2016      End of Session - 07/19/16 1143    Visit Number 16   Number of Visits 17   Date for SLP Re-Evaluation 07/22/16   SLP Start Time 0850   SLP Stop Time  0950   SLP Time Calculation (min) 60 min   Activity Tolerance Patient tolerated treatment well      Past Medical History:  Diagnosis Date  . Anemia   . Arthritis   . Chronic back pain   . Diabetes mellitus without complication ([place])   . Dysrhythmia   . GERD (gastroesophageal reflux disease)   . Hyperlipidemia   . Hypertension   . Hypothyroid   . Sleep apnea   . Syncope   . Tremors of nervous system     Past Surgical History:  Procedure Laterality Date  . ABDOMINAL HYSTERECTOMY    . BACK SURGERY  09/16  . CARDIAC CATHETERIZATION    . CATARACT EXTRACTION W/PHACO Right 11/24/2015   Procedure: CATARACT EXTRACTION PHACO AND INTRAOCULAR LENS PLACEMENT (IOC);  Surgeon: Estill Cotta, MD;  Location: [facility] ORS;  Service: Ophthalmology;  Laterality: Right;  [place] 01:14 AP% 25.5 CDE 30.69 fluid pack lot # [id number]  . COLONOSCOPY    . OOPHORECTOMY      There were no vitals filed for this visit.      Subjective Assessment - 07/19/16 1143    Subjective The patient reports using loud voice during 40-minute phone conversation with sister-in-law   Currently in Pain? No/denies               ADULT SLP TREATMENT - 07/19/16 0001      General Information   Behavior/Cognition Alert;Pleasant mood;Cooperative     Treatment Provided   Treatment provided Cognitive-Linquistic     Pain Assessment   Pain Assessment No/denies pain     Cognitive-Linquistic Treatment   Treatment focused on  Voice   Skilled Treatment Daily Task #1 (Maximum sustained "ah"): Average 15 seconds, 87 dB given no cues for loudness, min models for quality. Daily Task 2 (Maximum fundamental frequency range): Highs: 15 high pitched "ah" given no cues for loudness. Lows: 15 low pitched "ah" given no cues for loudness, min models for pitch. Daily task #3 (Maximum speech loudness drill of functional phrases): Average 74 dB given min cues.  Hierarchal speech loudness drill: Read paragraphs, 74 dB given no cues. Homework: assignments completed.  Off the cuff remarks: average 74 dB.     Assessment / Recommendations / Plan   Plan Continue with current plan of care     Progression Toward Goals   Progression toward goals Progressing toward goals          SLP Education - 07/19/16 1143    Education provided Yes   Education Details LSVT-LOUD   Person(s) Educated Patient   Methods Explanation   Comprehension Verbalized understanding            SLP Long Term Goals - 07/07/16 1235      SLP LONG TERM GOAL #1   Title The patient will complete Daily Tasks (Maximum duration "ah", High/Lows, and Functional Phrases) at average loudness of 80 dB and with loud, good quality voice.  Time 4   Period Weeks   Status Partially Met     SLP LONG TERM GOAL #2   Title The patient will complete Hierarchal Speech Loudness reading drills (words/phrases, sentences, and paragraph) at average 75 dB and with loud, good quality voice.     Time 4   Period Weeks   Status Partially Met     SLP LONG TERM GOAL #3   Title The patient will participate in conversation, maintaining average loudness of 75 dB and loud, good quality voice.   Time 4   Period Weeks   Status Partially Met     SLP LONG TERM GOAL #4   Title The patient will complete homework daily.   Time 4   Period Weeks   Status On-going          Plan - 07/19/16 1144    Clinical Impression Statement The patient is completing daily tasks and hierarchical  speech drill tasks with loud, good quality voice. She is able to independently remember and apply these strategies in spontaneous conversation. Althought the patient was not feeling well today, she maintained adequate volume and vocal quality throughout all conversational activities and asserted, "I will remember everything you say." She demonstrates confidence in her abilities as discharge approaches.   Speech Therapy Frequency 4x / week   Duration 4 weeks   Treatment/Interventions Other (comment)  LSVT-LOUD   Potential to Achieve Goals Good   Potential Considerations Cooperation/participation level;Ability to learn/carryover information;Family/community support   SLP Home Exercise Plan LSVT-LOUD daily exercises   Consulted and Agree with Plan of Care Patient      Patient will benefit from skilled therapeutic intervention in order to improve the following deficits and impairments:   Dysphonia    Problem List Patient Active Problem List   Diagnosis Date Noted  . Hyponatremia 12/31/2015    Rickard Rhymes  Graduate Student Clinician 07/19/2016, 11:48 AM  [place] MAIN [facility] SERVICES [address] [place], [place], [address] Phone: [phone]   Fax:  [phone]   Name: Stephanie [name] MRN: [id number] Date of Birth: [birth year]

## 2016-07-20 ENCOUNTER — Encounter: Payer: Self-pay | Admitting: Physical Therapy

## 2016-07-20 ENCOUNTER — Ambulatory Visit: Payer: Medicare Other | Admitting: Physical Therapy

## 2016-07-20 DIAGNOSIS — R262 Difficulty in walking, not elsewhere classified: Secondary | ICD-10-CM

## 2016-07-20 DIAGNOSIS — R490 Dysphonia: Secondary | ICD-10-CM | POA: Diagnosis not present

## 2016-07-20 NOTE — Therapy (Signed)
[place] MAIN [facility] SERVICES [address] [place], [place], [address] Phone: [phone]   Fax:  [phone]  Physical Therapy Treatment  Patient Details  Name: Stephanie [name] MRN: [id number] Date of Birth: 06-22-1946 Referring Provider: Vladimir Crofts   Encounter Date: 07/20/2016      PT End of Session - 07/20/16 1353    Visit Number 16   Number of Visits 17   Date for PT Re-Evaluation 14-Aug-2016   Authorization Type g codes   Authorization Time Period 6/10   Equipment Utilized During Treatment Gait belt   Activity Tolerance Patient tolerated treatment well   Behavior During Therapy [facility] for tasks assessed/performed      Past Medical History:  Diagnosis Date  . Anemia   . Arthritis   . Chronic back pain   . Diabetes mellitus without complication ([place])   . Dysrhythmia   . GERD (gastroesophageal reflux disease)   . Hyperlipidemia   . Hypertension   . Hypothyroid   . Sleep apnea   . Syncope   . Tremors of nervous system     Past Surgical History:  Procedure Laterality Date  . ABDOMINAL HYSTERECTOMY    . BACK SURGERY  09/16  . CARDIAC CATHETERIZATION    . CATARACT EXTRACTION W/PHACO Right 11/24/2015   Procedure: CATARACT EXTRACTION PHACO AND INTRAOCULAR LENS PLACEMENT (IOC);  Surgeon: Estill Cotta, MD;  Location: [facility] ORS;  Service: Ophthalmology;  Laterality: Right;  [place] 01:14 AP% 25.5 CDE 30.69 fluid pack lot # [id number]  . COLONOSCOPY    . OOPHORECTOMY      There were no vitals filed for this visit.      Subjective Assessment - 07/20/16 1352    Subjective Patient says that her eyes are a little blurry today from the computer.    Limitations Walking   How long can you sit comfortably? yes   How long can you stand comfortably? 10 mintues   How long can you walk comfortably? a few minutes   Patient Stated Goals to be able to walk longer   Currently in Pain? No/denies   Pain Score 0-No pain   Pain Onset  Today   Multiple Pain Sites No      Patient seen for LSVT Daily Session Maximal Daily Exercises for facilitation/coordination of movement Sustained movements are designed to rescale the amplitude of movement output for generalization to daily functional activities .Performed as follows for 1 set of 10 repetitions each multidirectional sustained movements 1) Floor to ceiling 2) Side to side multidirectional Repetitive movements performed in standing and are designed to provide retraining effort needed for sustained muscle activation in tasks   3) Step and reach forward 4) Step and reach backwards 5) Step and reach sideways 6) Rock and reach forward/backward 7) Rock and reach sideways Sit to stand functional component task with supervision 5 reps and simulated activities for:  Improved stepping on steps with ascending steps x 5 Coordination for fine motor control with pegs and card dealing for buttons and handling keys Scooting in and out with a chair at a table Working on reaching activities with LUE for pulling clothing out of the washer and dryer Ambulation with Big arm swing 1000 feet x 3 Patient has slowness of movement with LUE and has decreased coordination with LUE movement and decreased strength of LUE. She needs intermittent cuing to move her LUE correctly. Patient has a pulling pain in her LUE during exercises and the LUE gets  fatigued.                           PT Education - 07/20/16 1352    Education provided Yes   Education Details LSVT BIG   Person(s) Educated Patient   Methods Explanation   Comprehension Verbalized understanding             PT Long Term Goals - 06/14/16 1031      PT LONG TERM GOAL #1   Title Patient will be independent in home exercise program to improve strength/mobility for better functional independence with ADLs.   Time 4   Period Weeks   Status New     PT LONG TERM GOAL #2   Title Patient (> 82 years old) will complete  five times sit to stand test in < 15 seconds indicating an increased LE strength and improved balance.   Time 4   Period Weeks   Status New     PT LONG TERM GOAL #3   Title Patient will increase six minute walk test distance to >1000 for progression to community ambulator and improve gait ability   Time 4   Period Weeks   Status New     PT LONG TERM GOAL #4   Title Patient will increase 10 meter walk test to >1.30m/s as to improve gait speed for better community ambulation and to reduce fall risk.   Time 4   Period Weeks   Status New               Plan - 07/20/16 1353    Clinical Impression Statement fair cadence and reciprocating arm swing while doing dual verbal tasks. Patient continues to have decreased LUE coordination with simple tasks.    Rehab Potential Good   PT Frequency 4x / week   PT Duration 4 weeks   PT Treatment/Interventions Therapeutic exercise;Balance training;Neuromuscular re-education;Therapeutic activities;Gait training   PT Next Visit Plan LSVT BIG   PT Home Exercise Plan LSVT BIG   Consulted and Agree with Plan of Care Patient      Patient will benefit from skilled therapeutic intervention in order to improve the following deficits and impairments:  Abnormal gait, Decreased balance, Decreased mobility, Difficulty walking, Impaired sensation, Obesity, Pain, Impaired flexibility, Decreased strength, Decreased coordination, Decreased activity tolerance  Visit Diagnosis: Difficulty in walking, not elsewhere classified     Problem List Patient Active Problem List   Diagnosis Date Noted  . Hyponatremia 12/31/2015    Alanson Puls 07/20/2016, 1:55 PM  [place] MAIN [facility] SERVICES [address] [place], [place], [address] Phone: [phone]   Fax:  [phone]  Name: Stephanie [name] MRN: [id number] Date of Birth: July 25, 1946

## 2016-07-22 ENCOUNTER — Ambulatory Visit: Payer: Medicare Other | Admitting: Speech Pathology

## 2016-07-22 ENCOUNTER — Encounter: Payer: Self-pay | Admitting: Speech Pathology

## 2016-07-22 DIAGNOSIS — R490 Dysphonia: Secondary | ICD-10-CM | POA: Diagnosis not present

## 2016-07-22 NOTE — Therapy (Signed)
[place] MAIN [facility] SERVICES [address] [place], [place], [address] Phone: [phone]   Fax:  [phone]  Speech Language Pathology Treatment/Discharge Summary  Patient Details  Name: Stephanie [name] MRN: [id number] Date of Birth: 02-10-1946 Referring Provider: Dr. Manuella Ghazi  Encounter Date: 07/22/2016      End of Session - 07/22/16 0953    Visit Number 17   Number of Visits 17   Date for SLP Re-Evaluation 07/22/16   SLP Start Time 0900   SLP Stop Time  0953   SLP Time Calculation (min) 53 min   Activity Tolerance Patient tolerated treatment well      Past Medical History:  Diagnosis Date  . Anemia   . Arthritis   . Chronic back pain   . Diabetes mellitus without complication ([place])   . Dysrhythmia   . GERD (gastroesophageal reflux disease)   . Hyperlipidemia   . Hypertension   . Hypothyroid   . Sleep apnea   . Syncope   . Tremors of nervous system     Past Surgical History:  Procedure Laterality Date  . ABDOMINAL HYSTERECTOMY    . BACK SURGERY  09/16  . CARDIAC CATHETERIZATION    . CATARACT EXTRACTION W/PHACO Right 11/24/2015   Procedure: CATARACT EXTRACTION PHACO AND INTRAOCULAR LENS PLACEMENT (IOC);  Surgeon: Estill Cotta, MD;  Location: [facility] ORS;  Service: Ophthalmology;  Laterality: Right;  [place] 01:14 AP% 25.5 CDE 30.69 fluid pack lot # [id number]  . COLONOSCOPY    . OOPHORECTOMY      There were no vitals filed for this visit.      Subjective Assessment - 07/22/16 0952    Subjective "I think loud all the time"   Currently in Pain? No/denies               ADULT SLP TREATMENT - 07/22/16 0001      General Information   Behavior/Cognition Alert;Pleasant mood;Cooperative     Treatment Provided   Treatment provided Cognitive-Linquistic     Pain Assessment   Pain Assessment No/denies pain     Cognitive-Linquistic Treatment   Treatment focused on Voice   Skilled Treatment Daily Task #1  (Maximum sustained "ah"): Average 17 seconds, 85 dB. Daily Task 2 (Maximum fundamental frequency range): Highs: 15 high pitched "ah" given no cues. Lows: 15 low pitched "ah" given no cues. Daily task #3 (Maximum speech loudness drill of functional phrases): Average 75 dB.  Hierarchal speech loudness drill: Read paragraphs, 75 dB. Homework: assignments completed.  Off the cuff remarks: average 73 dB.     Assessment / Recommendations / Plan   Plan Discharge SLP treatment due to (comment);All goals met     Progression Toward Goals   Progression toward goals Goals met, education completed, patient discharged from [place] [facility] - 07/22/16 [id number]    Education provided Yes   Education Details LSVT-LOUD   Person(s) Educated Patient   Methods Explanation   Comprehension Verbalized understanding            SLP Long Term Goals - 07/22/16 0954      SLP LONG TERM GOAL #1   Title The patient will complete Daily Tasks (Maximum duration "ah", High/Lows, and Functional Phrases) at average loudness of 80 dB and with loud, good quality voice.    Status Achieved     SLP LONG TERM GOAL #2   Title The patient will complete Hierarchal Speech Loudness  reading drills (words/phrases, sentences, and paragraph) at average 75 dB and with loud, good quality voice.     Status Achieved     SLP LONG TERM GOAL #3   Title The patient will participate in conversation, maintaining average loudness of 75 dB and loud, good quality voice.   Status Achieved     SLP LONG TERM GOAL #4   Title The patient will complete homework daily.   Status Achieved          Plan - 08-20-2016 0953    Clinical Impression Statement The patient has completed the LSVT-LOUD program and has met her goals.  The patient is completing daily tasks and hierarchical speech drill tasks with loud, good quality voice. She is able to independently remember and apply these strategies in spontaneous conversation. Althought the patient  was not feeling well today, she maintained adequate volume and vocal quality throughout all conversational activities and asserted, "I will remember everything you say." She demonstrates confidence in her abilities as discharge approaches.   Speech Therapy Frequency Other (comment)  Discharge   Treatment/Interventions Other (comment)  LSVT-LOUD   Potential to Achieve Goals Good   Potential Considerations Cooperation/participation level;Ability to learn/carryover information;Family/community support   SLP Home Exercise Plan LSVT-LOUD forever homework   Consulted and Agree with Plan of Care Patient      Patient will benefit from skilled therapeutic intervention in order to improve the following deficits and impairments:   Dysphonia      G-Codes - 08-20-2016 0955    Functional Assessment Tool Used Clinical judgment, LSVT-LOUD protocol   Functional Limitations Voice   Voice Current Status (G9171) At least 1 percent but less than 20 percent impaired, limited or restricted   Voice Goal Status (G9172) At least 1 percent but less than 20 percent impaired, limited or restricted   Voice Discharge Status (X4481) At least 1 percent but less than 20 percent impaired, limited or restricted      Problem List Patient Active Problem List   Diagnosis Date Noted  . Hyponatremia 12/31/2015   [name], MS/CCC- SLP  Lou Miner 08-20-2016, 9:55 AM  [place] MAIN [facility] SERVICES [address] [place], [place], [address] Phone: [phone]   Fax:  [phone]   Name: INDIGO BARBIAN MRN: [id number] Date of Birth: 06/10/1946

## 2016-07-28 ENCOUNTER — Encounter: Payer: Self-pay | Admitting: Physical Therapy

## 2016-07-28 ENCOUNTER — Ambulatory Visit: Payer: Medicare Other | Admitting: Physical Therapy

## 2016-07-28 DIAGNOSIS — R262 Difficulty in walking, not elsewhere classified: Secondary | ICD-10-CM

## 2016-07-28 DIAGNOSIS — R490 Dysphonia: Secondary | ICD-10-CM | POA: Diagnosis not present

## 2016-07-28 NOTE — Addendum Note (Signed)
Addended by: Alanson Puls on: 07/28/2016 04:22 PM   Modules accepted: Orders

## 2016-07-28 NOTE — Therapy (Signed)
[place] MAIN [facility] SERVICES [address] [place], [place], [address] Phone: [phone]   Fax:  [phone]  Physical Therapy Treatment/Discharge Summary  Patient Details  Name: Stephanie [name] MRN: [id number] Date of Birth: 1945/12/13 Referring Provider: Vladimir Crofts   Encounter Date: 07/28/2016      PT End of Session - 07/28/16 1624    Visit Number 17   Number of Visits 17   Date for PT Re-Evaluation 08/09/16   Authorization Type g codes   Authorization Time Period 7/10   PT Start Time 0415   PT Stop Time 0515   PT Time Calculation (min) 60 min   Equipment Utilized During Treatment Gait belt   Activity Tolerance Patient tolerated treatment well   Behavior During Therapy [facility] for tasks assessed/performed      Past Medical History:  Diagnosis Date  . Anemia   . Arthritis   . Chronic back pain   . Diabetes mellitus without complication ([place])   . Dysrhythmia   . GERD (gastroesophageal reflux disease)   . Hyperlipidemia   . Hypertension   . Hypothyroid   . Sleep apnea   . Syncope   . Tremors of nervous system     Past Surgical History:  Procedure Laterality Date  . ABDOMINAL HYSTERECTOMY    . BACK SURGERY  09/16  . CARDIAC CATHETERIZATION    . CATARACT EXTRACTION W/PHACO Right 11/24/2015   Procedure: CATARACT EXTRACTION PHACO AND INTRAOCULAR LENS PLACEMENT (IOC);  Surgeon: Estill Cotta, MD;  Location: [facility] ORS;  Service: Ophthalmology;  Laterality: Right;  [place] 01:14 AP% 25.5 CDE 30.69 fluid pack lot # [id number]  . COLONOSCOPY    . OOPHORECTOMY      There were no vitals filed for this visit.      Subjective Assessment - 07/28/16 1622    Subjective Patient says that she has been running around to the Dr.    Robby Sermon Walking   How long can you sit comfortably? yes   How long can you stand comfortably? 10 mintues   How long can you walk comfortably? a few minutes   Patient Stated Goals to be able to walk  longer   Currently in Pain? No/denies   Pain Score 0-No pain   Pain Onset Today   Multiple Pain Sites No         OUTCOME MEASURES: TEST  Outcome  Interpretation   5 times sit<>stand  14.68sec  >61 yo, >15 sec indicates increased risk for falls   10 meter walk test    .95           m/s  <1.0 m/s indicates increased risk for falls; limited community ambulator   Timed up and Go  11.51              sec  <14 sec indicates increased risk for falls   6 minute walk test     740          Feet  1000 feet is community ambulator    Left peg 36.22 Right peg 22.57 Patient has tremor in LUE and has fatigue with all exercises and decreased coordination with activities.  Patient seen for LSVT Daily Session Maximal Daily Exercises for facilitation/coordination of movement Sustained movements are designed to rescale the amplitude of movement output for generalization to daily functional activities .Performed as follows for 1 set of 10 repetitions each multidirectional sustained movements 1) Floor to ceiling 2) Side to side multidirectional  Repetitive movements performed in standing and are designed to provide retraining effort needed for sustained muscle activation in tasks   3) Step and reach forward 4) Step and reach backwards 5) Step and reach sideways 6) Rock and reach forward/backward 7) Rock and reach sideways Sit to stand functional component task with supervision 5 reps and simulated activities for:  Improved stepping on steps with ascending steps x 5 Coordination for fine motor control with pegs and card dealing for buttons and handling keys Scooting in and out with a chair at a table Working on reaching activities with LUE for pulling clothing out of the washer and dryer Ambulation with Big arm swing 1000 feet x 3 Patient has slowness of movement with LUE and has decreased coordination with LUE movement and decreased strength of LUE. She needs intermittent cuing to move her LUE correctly. Patient  has a pulling pain in her LUE during exercises and the LUE gets fatigued.                          PT Education - 07/28/16 1623    Education provided Yes   Education Details LSVT BIG   Person(s) Educated Patient   Methods Explanation   Comprehension Verbalized understanding             PT Long Term Goals - 07/28/16 1619      PT LONG TERM GOAL #1   Title Patient will be independent in home exercise program to improve strength/mobility for better functional independence with ADLs.   Time 4   Period Weeks   Status Partially Met     PT LONG TERM GOAL #2   Title Patient (> 72 years old) will complete five times sit to stand test in < 15 seconds indicating an increased LE strength and improved balance.   Time 4   Period Weeks   Status Partially Met     PT LONG TERM GOAL #3   Title Patient will increase six minute walk test distance to >1000 for progression to community ambulator and improve gait ability   Time 4   Period Weeks   Status Partially Met     PT LONG TERM GOAL #4   Title Patient will increase 10 meter walk test to >1.72ms as to improve gait speed for better community ambulation and to reduce fall risk.   Time 4   Period Weeks   Status Partially Met               Plan - 07/28/16 1624    Clinical Impression Statement Patient has slowness of movement with LUE and has decreased coordination with LUE movement and decreased strength of LUE.    Rehab Potential Good   PT Frequency 4x / week   PT Duration 4 weeks   PT Treatment/Interventions Therapeutic exercise;Balance training;Neuromuscular re-education;Therapeutic activities;Gait training   PT Next Visit Plan LSVT BIG   PT Home Exercise Plan LSVT BIG   Consulted and Agree with Plan of Care Patient      Patient will benefit from skilled therapeutic intervention in order to improve the following deficits and impairments:  Abnormal gait, Decreased balance, Decreased mobility, Difficulty  walking, Impaired sensation, Obesity, Pain, Impaired flexibility, Decreased strength, Decreased coordination, Decreased activity tolerance  Visit Diagnosis: Difficulty in walking, not elsewhere classified     Problem List Patient Active Problem List   Diagnosis Date Noted  . Hyponatremia 12/31/2015    MArelia SneddonS 07/28/2016,  4:25 PM  [place] MAIN [facility] SERVICES [address] [place], [place], [address] Phone: [phone]   Fax:  [phone]  Name: AMANPREET DELMONT MRN: [id number] Date of Birth: 10-27-1946

## 2016-08-05 ENCOUNTER — Ambulatory Visit: Payer: Medicare Other | Admitting: Physical Therapy

## 2017-05-09 ENCOUNTER — Other Ambulatory Visit: Payer: Self-pay | Admitting: Internal Medicine

## 2017-05-09 DIAGNOSIS — Z1231 Encounter for screening mammogram for malignant neoplasm of breast: Secondary | ICD-10-CM

## 2017-05-31 ENCOUNTER — Ambulatory Visit
Admission: RE | Admit: 2017-05-31 | Discharge: 2017-05-31 | Disposition: A | Discharge status: Home / Self Care | Payer: Medicare Other | Source: Ambulatory Visit | Attending: Internal Medicine | Admitting: Internal Medicine

## 2017-05-31 DIAGNOSIS — Z1231 Encounter for screening mammogram for malignant neoplasm of breast: Secondary | ICD-10-CM | POA: Diagnosis present

## 2017-09-30 ENCOUNTER — Emergency Department
Admission: EM | Admit: 2017-09-30 | Discharge: 2017-09-30 | Disposition: A | Discharge status: Home / Self Care | Payer: Medicare Other | Attending: Emergency Medicine | Admitting: Emergency Medicine

## 2017-09-30 ENCOUNTER — Encounter: Payer: Self-pay | Admitting: Emergency Medicine

## 2017-09-30 ENCOUNTER — Other Ambulatory Visit: Payer: Self-pay

## 2017-09-30 DIAGNOSIS — Z7984 Long term (current) use of oral hypoglycemic drugs: Secondary | ICD-10-CM | POA: Diagnosis not present

## 2017-09-30 DIAGNOSIS — I10 Essential (primary) hypertension: Secondary | ICD-10-CM | POA: Insufficient documentation

## 2017-09-30 DIAGNOSIS — E119 Type 2 diabetes mellitus without complications: Secondary | ICD-10-CM | POA: Insufficient documentation

## 2017-09-30 DIAGNOSIS — E039 Hypothyroidism, unspecified: Secondary | ICD-10-CM | POA: Insufficient documentation

## 2017-09-30 DIAGNOSIS — M79674 Pain in right toe(s): Secondary | ICD-10-CM | POA: Diagnosis present

## 2017-09-30 DIAGNOSIS — M109 Gout, unspecified: Secondary | ICD-10-CM | POA: Diagnosis not present

## 2017-09-30 LAB — URIC ACID: Uric Acid, Serum: 7.9 mg/dL — ABNORMAL HIGH (ref 2.3–6.6)

## 2017-09-30 MED ORDER — INDOMETHACIN 50 MG PO CAPS
50.0000 mg | ORAL_CAPSULE | Freq: Once | ORAL | Status: AC
  Administered 2017-09-30: 50 mg via ORAL
  Filled 2017-09-30: qty 1

## 2017-09-30 MED ORDER — COLCHICINE 0.6 MG PO TABS: 0.6000 mg | ORAL_TABLET | Freq: Every day | ORAL | 0 refills | Status: DC

## 2017-09-30 MED ORDER — COLCHICINE 0.6 MG PO TABS
1.2000 mg | ORAL_TABLET | Freq: Once | ORAL | Status: AC
  Administered 2017-09-30: 1.2 mg via ORAL
  Filled 2017-09-30: qty 2

## 2017-09-30 MED ORDER — COLCHICINE 0.6 MG PO TABS
0.6000 mg | ORAL_TABLET | Freq: Once | ORAL | Status: AC
  Administered 2017-09-30: 0.6 mg via ORAL
  Filled 2017-09-30: qty 1

## 2017-09-30 NOTE — ED Provider Notes (Signed)
[facility] Emergency Department Provider Note   First MD Initiated Contact with Patient 09/30/17 [phone]     (approximate)  I have reviewed the triage vital signs and the nursing notes.   HISTORY  Chief Complaint Toe Pain    HPI Stephanie [name] is a 71 y.o. female with below list of chronic medical conditions including previous episodes of gout presents to the emergency department with acute onset of 10 out of 10 throbbing right great toe pain that is nontraumatic in nature.  Patient denies any fever.  Patient states symptoms consistent with previous gout exacerbations.   Past Medical History:  Diagnosis Date  . Anemia   . Arthritis   . Chronic back pain   . Diabetes mellitus without complication ([place])   . Dysrhythmia   . GERD (gastroesophageal reflux disease)   . Hyperlipidemia   . Hypertension   . Hypothyroid   . Sleep apnea   . Syncope   . Tremors of nervous system     Patient Active Problem List   Diagnosis Date Noted  . Hyponatremia 12/31/2015    Past Surgical History:  Procedure Laterality Date  . ABDOMINAL HYSTERECTOMY    . BACK SURGERY  09/16  . CARDIAC CATHETERIZATION    . CATARACT EXTRACTION W/PHACO Right 11/24/2015   Procedure: CATARACT EXTRACTION PHACO AND INTRAOCULAR LENS PLACEMENT (IOC);  Surgeon: Estill Cotta, MD;  Location: [facility] ORS;  Service: Ophthalmology;  Laterality: Right;  [place] 01:14 AP% 25.5 CDE 30.69 fluid pack lot # [id number]  . COLONOSCOPY    . OOPHORECTOMY      Prior to Admission medications   Medication Sig Start Date End Date Taking? Authorizing Provider  enalapril (VASOTEC) 20 MG tablet Take 20 mg by mouth 2 (two) times daily.     [provider]  fluticasone (FLONASE) 50 MCG/ACT nasal spray Place 1-2 sprays into the nose as needed. 09/29/15   [provider]  glimepiride (AMARYL) 4 MG tablet Take 4 mg by mouth 2 (two) times daily. Pt. Takes 1 tablet in the morning and .5 tablet at  night.    [provider]  levothyroxine (SYNTHROID, LEVOTHROID) 25 MCG tablet Take 25 mcg by mouth daily before breakfast.    [provider]  meloxicam (MOBIC) 15 MG tablet Take 15 mg by mouth daily.    [provider]  metFORMIN (GLUCOPHAGE) 1000 MG tablet Take 1,000 mg by mouth 2 (two) times daily with a meal.     [provider]  Multiple Vitamin (MULTIVITAMIN WITH MINERALS) TABS tablet Take 1 tablet by mouth daily.    [provider]  pantoprazole (PROTONIX) 40 MG tablet Take 1 tablet (40 mg total) by mouth daily. 01/02/16   Bettey Costa, MD  pravastatin (PRAVACHOL) 40 MG tablet Take 40 mg by mouth daily.    [provider]  spironolactone (ALDACTONE) 50 MG tablet Take 50 mg by mouth 2 (two) times daily.    [provider]    Allergies No known drug allergies  Family History  Problem Relation Age of Onset  . Breast cancer Paternal Aunt     Social History Social History   Tobacco Use  . Smoking status: Never Smoker  . Smokeless tobacco: Never Used  Substance Use Topics  . Alcohol use: Yes  . Drug use: No    Review of Systems Constitutional: No fever/chills Eyes: No visual changes. ENT: No sore throat. Cardiovascular: Denies chest pain. Respiratory: Denies shortness of breath. Gastrointestinal:  No abdominal pain.  No nausea, no vomiting.  No diarrhea.  No constipation. Genitourinary: Negative for dysuria. Musculoskeletal: Negative for neck pain.  Negative for back pain.  Positive for right great toe pain. Integumentary: Negative for rash. Neurological: Negative for headaches, focal weakness or numbness.   ____________________________________________   PHYSICAL EXAM:  VITAL SIGNS: ED Triage Vitals  Enc Vitals Group     BP 09/30/17 0438 (!) 164/86     Pulse Rate 09/30/17 0438 87     Resp 09/30/17 0438 18     Temp 09/30/17 0438 98.4 F (36.9 C)     Temp Source 09/30/17 0438 Oral     SpO2 09/30/17 0438  98 %     Weight 09/30/17 0433 97.1 kg (214 lb)     Height 09/30/17 0433 1.549 m (5\' 1" )     Head Circumference --      Peak Flow --      Pain Score 09/30/17 0433 9     Pain Loc --      Pain Edu? --      Excl. in [place]? --     Constitutional: Alert and oriented. Well appearing and in no acute distress. Head: Atraumatic. Neck: No stridor.  Cardiovascular: Normal rate, regular rhythm. Good peripheral circulation. Grossly normal heart sounds. Respiratory: Normal respiratory effort.  No retractions. Lungs CTAB. Gastrointestinal: Soft and nontender. No distention.  Musculoskeletal: Warmth and tenderness to the right great toe at the PIP joint.  Tenderness to palpation.   Neurologic:  Normal speech and language. No gross focal neurologic deficits are appreciated.  Skin:  Skin is warm, dry and intact. No rash noted.   ____________________________________________   LABS (all labs ordered are listed, but only abnormal results are displayed)  Labs Reviewed  URIC ACID - Abnormal; Notable for the following components:      Result Value   Uric Acid, Serum 7.9 (*)    All other components within normal limits     Procedures   ____________________________________________   INITIAL IMPRESSION / ASSESSMENT AND PLAN / ED COURSE  As part of my medical decision making, I reviewed the following data within the electronic MEDICAL RECORD [id number] year old female present with above-stated history and physical exam consistent with acute gout of the right great toe.  Patient given colchicine in the emergency department improvement in pain will be prescribed colchicine for home.  Review of the patient's laboratory data revealed a normal creatinine.  Patient has a history of diabetes and as such we will try to avoid prednisone to avoid hyperglycemia.  Consider indomethacin however given the patient's age this was not prescribed. ____________________________________________  FINAL CLINICAL IMPRESSION(S) / ED  DIAGNOSES  Final diagnoses:  Acute gout involving toe of right foot, unspecified cause     MEDICATIONS GIVEN DURING THIS VISIT:  Medications  colchicine tablet 1.2 mg (1.2 mg Oral Given 09/30/17 0516)  indomethacin (INDOCIN) capsule 50 mg (50 mg Oral Given 09/30/17 0516)  colchicine tablet 0.6 mg (0.6 mg Oral Given 09/30/17 0517)     ED Discharge Orders    None       Note:  This document was prepared using Dragon voice recognition software and may include unintentional dictation errors.    Gregor Hams, MD 09/30/17 [phone]

## 2017-09-30 NOTE — ED Triage Notes (Signed)
Pt to triage via w/c with no distress noted; Pt reports gout to right great toe since 830pm, hx of same

## 2018-06-12 ENCOUNTER — Other Ambulatory Visit: Payer: Self-pay | Admitting: Internal Medicine

## 2018-06-12 DIAGNOSIS — Z1231 Encounter for screening mammogram for malignant neoplasm of breast: Secondary | ICD-10-CM

## 2018-06-27 ENCOUNTER — Ambulatory Visit
Admission: RE | Admit: 2018-06-27 | Discharge: 2018-06-27 | Disposition: A | Discharge status: Home / Self Care | Payer: Medicare Other | Source: Ambulatory Visit | Attending: Internal Medicine | Admitting: Internal Medicine

## 2018-06-27 DIAGNOSIS — Z1231 Encounter for screening mammogram for malignant neoplasm of breast: Secondary | ICD-10-CM | POA: Diagnosis present

## 2019-03-27 ENCOUNTER — Other Ambulatory Visit: Payer: Self-pay | Admitting: Gastroenterology

## 2019-03-27 DIAGNOSIS — R109 Unspecified abdominal pain: Secondary | ICD-10-CM

## 2019-03-27 DIAGNOSIS — R600 Localized edema: Secondary | ICD-10-CM

## 2019-03-27 DIAGNOSIS — R140 Abdominal distension (gaseous): Secondary | ICD-10-CM

## 2019-03-28 ENCOUNTER — Other Ambulatory Visit: Payer: Self-pay

## 2019-03-28 ENCOUNTER — Ambulatory Visit
Admission: RE | Admit: 2019-03-28 | Discharge: 2019-03-28 | Disposition: A | Discharge status: Home / Self Care | Payer: Medicare Other | Source: Ambulatory Visit | Attending: Gastroenterology | Admitting: Gastroenterology

## 2019-03-28 DIAGNOSIS — R600 Localized edema: Secondary | ICD-10-CM | POA: Insufficient documentation

## 2019-03-28 DIAGNOSIS — R109 Unspecified abdominal pain: Secondary | ICD-10-CM | POA: Diagnosis present

## 2019-03-28 DIAGNOSIS — R140 Abdominal distension (gaseous): Secondary | ICD-10-CM | POA: Insufficient documentation

## 2019-03-28 MED ORDER — IOHEXOL 300 MG/ML  SOLN
100.0000 mL | Freq: Once | INTRAMUSCULAR | Status: AC | PRN
  Administered 2019-03-28: 100 mL via INTRAVENOUS

## 2019-05-09 DIAGNOSIS — I447 Left bundle-branch block, unspecified: Secondary | ICD-10-CM | POA: Insufficient documentation

## 2019-07-20 ENCOUNTER — Other Ambulatory Visit: Payer: Self-pay

## 2019-07-20 ENCOUNTER — Other Ambulatory Visit
Admission: RE | Admit: 2019-07-20 | Discharge: 2019-07-20 | Disposition: A | Discharge status: Home / Self Care | Payer: Medicare Other | Source: Ambulatory Visit | Attending: Internal Medicine | Admitting: Internal Medicine

## 2019-07-20 DIAGNOSIS — R1310 Dysphagia, unspecified: Secondary | ICD-10-CM | POA: Diagnosis not present

## 2019-07-20 DIAGNOSIS — Z01812 Encounter for preprocedural laboratory examination: Secondary | ICD-10-CM | POA: Insufficient documentation

## 2019-07-20 DIAGNOSIS — R7989 Other specified abnormal findings of blood chemistry: Secondary | ICD-10-CM | POA: Insufficient documentation

## 2019-07-20 DIAGNOSIS — R140 Abdominal distension (gaseous): Secondary | ICD-10-CM | POA: Diagnosis not present

## 2019-07-20 DIAGNOSIS — Z20828 Contact with and (suspected) exposure to other viral communicable diseases: Secondary | ICD-10-CM | POA: Insufficient documentation

## 2019-07-20 DIAGNOSIS — Z1211 Encounter for screening for malignant neoplasm of colon: Secondary | ICD-10-CM | POA: Diagnosis not present

## 2019-07-20 LAB — SARS CORONAVIRUS 2 (TAT 6-24 HRS): SARS Coronavirus 2: NEGATIVE

## 2019-07-24 ENCOUNTER — Ambulatory Visit
Admission: RE | Admit: 2019-07-24 | Discharge: 2019-07-24 | Disposition: A | Discharge status: Home / Self Care | Payer: Medicare Other | Attending: Internal Medicine | Admitting: Internal Medicine

## 2019-07-24 ENCOUNTER — Other Ambulatory Visit: Payer: Self-pay

## 2019-07-24 ENCOUNTER — Encounter
Admission: RE | Disposition: A | Discharge status: Home / Self Care | Payer: Self-pay | Source: Home / Self Care | Attending: Internal Medicine

## 2019-07-24 ENCOUNTER — Ambulatory Visit: Payer: Medicare Other | Admitting: Certified Registered"

## 2019-07-24 DIAGNOSIS — Z7984 Long term (current) use of oral hypoglycemic drugs: Secondary | ICD-10-CM | POA: Insufficient documentation

## 2019-07-24 DIAGNOSIS — E039 Hypothyroidism, unspecified: Secondary | ICD-10-CM | POA: Insufficient documentation

## 2019-07-24 DIAGNOSIS — G4730 Sleep apnea, unspecified: Secondary | ICD-10-CM | POA: Diagnosis not present

## 2019-07-24 DIAGNOSIS — M1990 Unspecified osteoarthritis, unspecified site: Secondary | ICD-10-CM | POA: Diagnosis not present

## 2019-07-24 DIAGNOSIS — E785 Hyperlipidemia, unspecified: Secondary | ICD-10-CM | POA: Insufficient documentation

## 2019-07-24 DIAGNOSIS — K2970 Gastritis, unspecified, without bleeding: Secondary | ICD-10-CM | POA: Insufficient documentation

## 2019-07-24 DIAGNOSIS — R1310 Dysphagia, unspecified: Secondary | ICD-10-CM | POA: Diagnosis not present

## 2019-07-24 DIAGNOSIS — K219 Gastro-esophageal reflux disease without esophagitis: Secondary | ICD-10-CM | POA: Diagnosis not present

## 2019-07-24 DIAGNOSIS — Z79899 Other long term (current) drug therapy: Secondary | ICD-10-CM | POA: Diagnosis not present

## 2019-07-24 DIAGNOSIS — I10 Essential (primary) hypertension: Secondary | ICD-10-CM | POA: Diagnosis not present

## 2019-07-24 DIAGNOSIS — E611 Iron deficiency: Secondary | ICD-10-CM | POA: Diagnosis not present

## 2019-07-24 DIAGNOSIS — Z7982 Long term (current) use of aspirin: Secondary | ICD-10-CM | POA: Insufficient documentation

## 2019-07-24 DIAGNOSIS — Z1211 Encounter for screening for malignant neoplasm of colon: Secondary | ICD-10-CM | POA: Insufficient documentation

## 2019-07-24 DIAGNOSIS — K640 First degree hemorrhoids: Secondary | ICD-10-CM | POA: Insufficient documentation

## 2019-07-24 DIAGNOSIS — Z791 Long term (current) use of non-steroidal anti-inflammatories (NSAID): Secondary | ICD-10-CM | POA: Diagnosis not present

## 2019-07-24 DIAGNOSIS — Z79890 Hormone replacement therapy (postmenopausal): Secondary | ICD-10-CM | POA: Diagnosis not present

## 2019-07-24 DIAGNOSIS — E119 Type 2 diabetes mellitus without complications: Secondary | ICD-10-CM | POA: Diagnosis not present

## 2019-07-24 DIAGNOSIS — R251 Tremor, unspecified: Secondary | ICD-10-CM | POA: Insufficient documentation

## 2019-07-24 DIAGNOSIS — D123 Benign neoplasm of transverse colon: Secondary | ICD-10-CM | POA: Insufficient documentation

## 2019-07-24 DIAGNOSIS — R140 Abdominal distension (gaseous): Secondary | ICD-10-CM | POA: Insufficient documentation

## 2019-07-24 HISTORY — PX: COLONOSCOPY WITH PROPOFOL: SHX5780

## 2019-07-24 HISTORY — PX: ESOPHAGOGASTRODUODENOSCOPY: SHX5428

## 2019-07-24 LAB — GLUCOSE, CAPILLARY: Glucose-Capillary: 109 mg/dL — ABNORMAL HIGH (ref 70–99)

## 2019-07-24 SURGERY — EGD (ESOPHAGOGASTRODUODENOSCOPY)
Anesthesia: General

## 2019-07-24 MED ORDER — PROPOFOL 10 MG/ML IV BOLUS
INTRAVENOUS | Status: DC | PRN
  Administered 2019-07-24 (×3): 20 mg via INTRAVENOUS
  Administered 2019-07-24: 10 mg via INTRAVENOUS
  Administered 2019-07-24 (×5): 20 mg via INTRAVENOUS
  Administered 2019-07-24: 70 mg via INTRAVENOUS
  Administered 2019-07-24: 20 mg via INTRAVENOUS

## 2019-07-24 MED ORDER — SODIUM CHLORIDE 0.9 % IV SOLN
INTRAVENOUS | Status: DC
  Administered 2019-07-24: 09:00:00 via INTRAVENOUS
  Administered 2019-07-24: 09:00:00 1000 mL via INTRAVENOUS

## 2019-07-24 MED ORDER — LIDOCAINE HCL (CARDIAC) PF 100 MG/5ML IV SOSY
PREFILLED_SYRINGE | INTRAVENOUS | Status: DC | PRN
  Administered 2019-07-24: 100 mg via INTRATRACHEAL

## 2019-07-24 MED ORDER — GLYCOPYRROLATE 0.2 MG/ML IJ SOLN
INTRAMUSCULAR | Status: DC | PRN
  Administered 2019-07-24: 0.2 mg via INTRAVENOUS

## 2019-07-24 NOTE — Interval H&P Note (Signed)
History and Physical Interval Note:  07/24/2019 8:59 AM  Stephanie [name]  has presented today for surgery, with the diagnosis of DYSPHAGIA,ABD.DISTENTION,COLON CANCER SCREENING.  The various methods of treatment have been discussed with the patient and family. After consideration of risks, benefits and other options for treatment, the patient has consented to  Procedure(s): ESOPHAGOGASTRODUODENOSCOPY (EGD) (N/A) COLONOSCOPY WITH PROPOFOL (N/A) as a surgical intervention.  The patient's history has been reviewed, patient examined, no change in status, stable for surgery.  I have reviewed the patient's chart and labs.  Questions were answered to the patient's satisfaction.     [place], [place]

## 2019-07-24 NOTE — Op Note (Signed)
[facility] Gastroenterology Patient Name: Stephanie [name] Procedure Date: 07/24/2019 9:01 AM MRN: [id number] Account #: [id number] Date of Birth: 23-Dec-1945 Admit Type: Outpatient Age: 73 Room: [facility] ENDO ROOM 3 Gender: Female Note Status: Finalized Procedure:            Colonoscopy Indications:          Screening for colorectal malignant neoplasm Providers:            Benay Pike. [place] MD, MD Medicines:            Propofol per Anesthesia Complications:        No immediate complications. Procedure:            Pre-Anesthesia Assessment:                       - The risks and benefits of the procedure and the                        sedation options and risks were discussed with the                        patient. All questions were answered and informed                        consent was obtained.                       - Patient identification and proposed procedure were                        verified prior to the procedure by the nurse. The                        procedure was verified in the procedure room.                       - ASA Grade Assessment: III - A patient with severe                        systemic disease.                       - After reviewing the risks and benefits, the patient                        was deemed in satisfactory condition to undergo the                        procedure.                       After obtaining informed consent, the colonoscope was                        passed under direct vision. Throughout the procedure,                        the patient's blood pressure, pulse, and oxygen                        saturations were monitored continuously. The  Colonoscope was introduced through the anus and                        advanced to the the cecum, identified by appendiceal                        orifice and ileocecal valve. The colonoscopy was                        performed without difficulty. The  patient tolerated the                        procedure well. The quality of the bowel preparation                        was excellent. The ileocecal valve, appendiceal                        orifice, and rectum were photographed. Findings:      The perianal and digital rectal examinations were normal. Pertinent       negatives include normal sphincter tone and no palpable rectal lesions.      A 5 mm polyp was found in the transverse colon. The polyp was sessile.       The polyp was removed with a jumbo cold forceps. Resection and retrieval       were complete.      Non-bleeding internal hemorrhoids were found during retroflexion. The       hemorrhoids were Grade I (internal hemorrhoids that do not prolapse).      The exam was otherwise without abnormality. Impression:           - One 5 mm polyp in the transverse colon, removed with                        a jumbo cold forceps. Resected and retrieved.                       - Non-bleeding internal hemorrhoids.                       - The examination was otherwise normal. Recommendation:       - Monitor results to esophageal dilation                       - Await pathology results from EGD, also performed                        today.                       - Patient has a contact number available for                        emergencies. The signs and symptoms of potential                        delayed complications were discussed with the patient.                        Return to normal activities tomorrow. Written discharge  instructions were provided to the patient.                       - Resume previous diet.                       - Continue present medications.                       - Await pathology results.                       - Repeat colonoscopy date to be determined after                        pending pathology results are reviewed for surveillance.                       - Return to physician assistant in  3 months.                       - The findings and recommendations were discussed with                        the patient. Procedure Code(s):    --- Professional ---                       [phone], Colonoscopy, flexible; with biopsy, single or                        multiple Diagnosis Code(s):    --- Professional ---                       K64.0, First degree hemorrhoids                       K63.5, Polyp of colon                       Z12.11, Encounter for screening for malignant neoplasm                        of colon CPT copyright 2019 American Medical Association. All rights reserved. The codes documented in this report are preliminary and upon coder review may  be revised to meet current compliance requirements. Efrain Sella MD, MD 07/24/2019 9:41:25 AM This report has been signed electronically. Number of Addenda: 0 Note Initiated On: 07/24/2019 9:01 AM Scope Withdrawal Time: 0 hours 8 minutes 40 seconds  Total Procedure Duration: 0 hours 12 minutes 38 seconds  Estimated Blood Loss: Estimated blood loss: none.      [facility]

## 2019-07-24 NOTE — H&P (Signed)
Outpatient short stay form Pre-procedure 07/24/2019 8:58 AM Stephanie [name], M.D.  Primary Physician: Fulton Reek, M.D.  Reason for visit:  Colon cancer screening, Dysphagia, Iron deficiency, abdominal bloating  History of present illness:  As above. Last colonoscopy 10 years ago unremarkable. Has postprandial abdominal bloating and solid food dysphagia. No weight loss. Has low ferritin and low Hgb (10.5).    Current Facility-Administered Medications:  .  0.9 %  sodium chloride infusion, , Intravenous, Continuous, [employer], Benay Pike, MD, Last Rate: 20 mL/hr at 07/24/19 0831, 1,000 mL at 07/24/19 0831  Medications Prior to Admission  Medication Sig Dispense Refill Last Dose  . allopurinol (ZYLOPRIM) 100 MG tablet Take 100 mg by mouth daily.   07/23/2019 at Unknown time  . Aspirin-Acetaminophen-Caffeine (GOODY HEADACHE PO) Take by mouth.   Past Week at Unknown time  . carbidopa-levodopa (SINEMET IR) 25-100 MG tablet Take 2.5 tablets by mouth 4 (four) times daily.   07/24/2019 at 0630  . docusate sodium (COLACE) 100 MG capsule Take 100 mg by mouth 2 (two) times daily.   Past Week at Unknown time  . enalapril (VASOTEC) 20 MG tablet Take 20 mg by mouth 2 (two) times daily.    07/23/2019 at Unknown time  . ferrous sulfate 325 (65 FE) MG tablet Take 325 mg by mouth daily with breakfast.   Past Week at Unknown time  . furosemide (LASIX) 40 MG tablet Take 40 mg by mouth daily.   07/23/2019 at Unknown time  . gabapentin (NEURONTIN) 300 MG capsule Take 300 mg by mouth at bedtime.   07/23/2019 at Unknown time  . glimepiride (AMARYL) 4 MG tablet Take 4 mg by mouth 2 (two) times daily. Pt. Takes 1 tablet in the morning and .5 tablet at night.   07/23/2019 at Unknown time  . levothyroxine (SYNTHROID, LEVOTHROID) 25 MCG tablet Take 25 mcg by mouth daily before breakfast.   07/23/2019 at Unknown time  . meloxicam (MOBIC) 15 MG tablet Take 15 mg by mouth daily.   Past Week at Unknown time  . metFORMIN  (GLUCOPHAGE) 1000 MG tablet Take 1,000 mg by mouth 2 (two) times daily with a meal.    Past Week at Unknown time  . Multiple Vitamin (MULTIVITAMIN WITH MINERALS) TABS tablet Take 1 tablet by mouth daily.   Past Week at Unknown time  . oxyCODONE-acetaminophen (PERCOCET/ROXICET) 5-325 MG tablet Take 1 tablet by mouth every 6 (six) hours as needed for severe pain.   Past Week at Unknown time  . pioglitazone (ACTOS) 30 MG tablet Take 30 mg by mouth daily.   Past Week at Unknown time  . pravastatin (PRAVACHOL) 40 MG tablet Take 40 mg by mouth daily.   07/23/2019 at Unknown time  . colchicine 0.6 MG tablet Take 1 tablet (0.6 mg total) by mouth daily. 30 tablet 0   . fluticasone (FLONASE) 50 MCG/ACT nasal spray Place 1-2 sprays into the nose as needed.     . pantoprazole (PROTONIX) 40 MG tablet Take 1 tablet (40 mg total) by mouth daily. 30 tablet 0   . spironolactone (ALDACTONE) 50 MG tablet Take 50 mg by mouth 2 (two) times daily.        No Known Allergies   Past Medical History:  Diagnosis Date  . Anemia   . Arthritis   . Chronic back pain   . Diabetes mellitus without complication ([place])   . Dysrhythmia   . GERD (gastroesophageal reflux disease)   . Hyperlipidemia   .  Hypertension   . Hypothyroid   . Sleep apnea   . Syncope   . Tremors of nervous system     Review of systems:  Otherwise negative.    Physical Exam  Gen: Alert, oriented. Appears stated age.  HEENT: [place]/AT. PERRLA. Lungs: CTA, no wheezes. CV: RR nl S1, S2. Abd: soft, benign, no masses. BS+ Ext: No edema. Pulses 2+    Planned procedures: Proceed with EGD and colonoscopy. The patient understands the nature of the planned procedure, indications, risks, alternatives and potential complications including but not limited to bleeding, infection, perforation, damage to internal organs and possible oversedation/side effects from anesthesia. The patient agrees and gives consent to proceed.  Please refer to procedure notes  for findings, recommendations and patient disposition/instructions.     Stephanie [name], M.D. Gastroenterology 07/24/2019  8:58 AM

## 2019-07-24 NOTE — Anesthesia Postprocedure Evaluation (Signed)
Anesthesia Post Note  Patient: Stephanie [name]  Procedure(s) Performed: ESOPHAGOGASTRODUODENOSCOPY (EGD) (N/A ) COLONOSCOPY WITH PROPOFOL (N/A )  Patient location during evaluation: Endoscopy Anesthesia Type: General Level of consciousness: awake and alert and oriented Pain management: pain level controlled Vital Signs Assessment: post-procedure vital signs reviewed and stable Respiratory status: spontaneous breathing, nonlabored ventilation and respiratory function stable Cardiovascular status: blood pressure returned to baseline and stable Postop Assessment: no signs of nausea or vomiting Anesthetic complications: no     Last Vitals:  Vitals:   07/24/19 0952 07/24/19 1002  BP: (!) 144/55 (!) 166/96  Pulse: 74 78  Resp: 17 (!) 28  Temp:    SpO2: 98% 97%    Last Pain:  Vitals:   07/24/19 1002  TempSrc:   PainSc: 0-No pain                 [place]

## 2019-07-24 NOTE — Interval H&P Note (Signed)
History and Physical Interval Note:  07/24/2019 9:00 AM  Stephanie [name]  has presented today for surgery, with the diagnosis of DYSPHAGIA,ABD.DISTENTION,COLON CANCER SCREENING.  The various methods of treatment have been discussed with the patient and family. After consideration of risks, benefits and other options for treatment, the patient has consented to  Procedure(s): ESOPHAGOGASTRODUODENOSCOPY (EGD) (N/A) COLONOSCOPY WITH PROPOFOL (N/A) as a surgical intervention.  The patient's history has been reviewed, patient examined, no change in status, stable for surgery.  I have reviewed the patient's chart and labs.  Questions were answered to the patient's satisfaction.     [place], [place]

## 2019-07-24 NOTE — Transfer of Care (Signed)
Immediate Anesthesia Transfer of Care Note  Patient: Stephanie [name]  Procedure(s) Performed: ESOPHAGOGASTRODUODENOSCOPY (EGD) (N/A ) COLONOSCOPY WITH PROPOFOL (N/A )  Patient Location: Endoscopy Unit  Anesthesia Type:General  Level of Consciousness: drowsy and patient cooperative  Airway & Oxygen Therapy: Patient Spontanous Breathing and Patient connected to face mask oxygen  Post-op Assessment: Report given to RN and Post -op Vital signs reviewed and stable  Post vital signs: Reviewed and stable  Last Vitals:  Vitals Value Taken Time  BP 142/69 07/24/19 0942  Temp 36.4 C 07/24/19 0942  Pulse 70 07/24/19 0944  Resp 16 07/24/19 0944  SpO2 100 % 07/24/19 0944  Vitals shown include unvalidated device data.  Last Pain:  Vitals:   07/24/19 0942  TempSrc: Tympanic  PainSc:          Complications: No apparent anesthesia complications

## 2019-07-24 NOTE — Op Note (Signed)
[facility] Gastroenterology Patient Name: Stephanie [name] Procedure Date: 07/24/2019 9:02 AM MRN: [id number] Account #: [id number] Date of Birth: [birth year] Admit Type: Outpatient Age: 73 Room: [facility] ENDO ROOM 3 Gender: Female Note Status: Finalized Procedure:            Upper GI endoscopy Indications:          Dysphagia, Abdominal bloating Providers:            Benay Pike. Alice Reichert MD, MD Referring MD:         Leonie Douglas. Doy Hutching, MD (Referring MD) Medicines:            Propofol per Anesthesia Complications:        No immediate complications. Procedure:            Pre-Anesthesia Assessment:                       - The risks and benefits of the procedure and the                        sedation options and risks were discussed with the                        patient. All questions were answered and informed                        consent was obtained.                       - Patient identification and proposed procedure were                        verified prior to the procedure by the nurse. The                        procedure was verified in the procedure room.                       - ASA Grade Assessment: III - A patient with severe                        systemic disease.                       - After reviewing the risks and benefits, the patient                        was deemed in satisfactory condition to undergo the                        procedure.                       After obtaining informed consent, the endoscope was                        passed under direct vision. Throughout the procedure,                        the patient's blood pressure, pulse, and oxygen  saturations were monitored continuously. The Endoscope                        was introduced through the mouth, and advanced to the                        third part of duodenum. The upper GI endoscopy was                        accomplished without difficulty. The patient  tolerated                        the procedure well. Findings:      No endoscopic abnormality was evident in the esophagus to explain the       patient's complaint of dysphagia. It was decided, however, to proceed       with dilation in the distal esophagus. The scope was withdrawn. Dilation       was performed with a Maloney dilator with no resistance at 48 Fr.      Patchy moderate inflammation characterized by erosions and erythema was       found in the gastric body and in the gastric antrum. Biopsies were taken       with a cold forceps for Helicobacter pylori testing.      The examined duodenum was normal.      The cardia and gastric fundus were normal on retroflexion.      The exam was otherwise without abnormality. Impression:           - No endoscopic esophageal abnormality to explain                        patient's dysphagia. Esophagus dilated. Dilated.                       - Gastritis. Biopsied.                       - Normal examined duodenum.                       - The examination was otherwise normal. Recommendation:       - Await pathology results.                       - Monitor results to esophageal dilation                       - Proceed with colonoscopy Procedure Code(s):    --- Professional ---                       [phone], Esophagogastroduodenoscopy, flexible, transoral;                        with biopsy, single or multiple                       [id number], Dilation of esophagus, by unguided sound or                        bougie, single or multiple passes Diagnosis Code(s):    --- Professional ---  R14.0, Abdominal distension (gaseous)                       K29.70, Gastritis, unspecified, without bleeding                       R13.10, Dysphagia, unspecified CPT copyright 2019 American Medical Association. All rights reserved. The codes documented in this report are preliminary and upon coder review may  be revised to meet current compliance  requirements. Efrain Sella MD, MD 07/24/2019 9:23:42 AM This report has been signed electronically. Number of Addenda: 0 Note Initiated On: 07/24/2019 9:02 AM Estimated Blood Loss: Estimated blood loss: none.      [facility]

## 2019-07-24 NOTE — Anesthesia Post-op Follow-up Note (Signed)
Anesthesia QCDR form completed.        

## 2019-07-24 NOTE — Anesthesia Preprocedure Evaluation (Signed)
Anesthesia Evaluation  Patient identified by MRN, date of birth, ID band Patient awake    Reviewed: Allergy & Precautions, NPO status , Patient's Chart, lab work & pertinent test results  History of Anesthesia Complications Negative for: history of anesthetic complications  Airway Mallampati: II  TM Distance: >3 FB Neck ROM: Full    Dental  (+) Upper Dentures, Lower Dentures   Pulmonary sleep apnea , neg COPD,    breath sounds clear to auscultation- rhonchi (-) wheezing      Cardiovascular hypertension, (-) CAD, (-) Past MI, (-) Cardiac Stents and (-) CABG  Rhythm:Regular Rate:Normal - Systolic murmurs and - Diastolic murmurs    Neuro/Psych neg Seizures negative neurological ROS  negative psych ROS   GI/Hepatic Neg liver ROS, GERD  ,  Endo/Other  diabetes, Oral Hypoglycemic AgentsHypothyroidism   Renal/GU negative Renal ROS     Musculoskeletal  (+) Arthritis ,   Abdominal (+) + obese,   Peds  Hematology  (+) anemia ,   Anesthesia Other Findings Past Medical History: No date: Anemia No date: Arthritis No date: Chronic back pain No date: Diabetes mellitus without complication (HCC) No date: Dysrhythmia No date: GERD (gastroesophageal reflux disease) No date: Hyperlipidemia No date: Hypertension No date: Hypothyroid No date: Sleep apnea No date: Syncope No date: Tremors of nervous system   Reproductive/Obstetrics                             Anesthesia Physical Anesthesia Plan  ASA: III  Anesthesia Plan: General   Post-op Pain Management:    Induction: Intravenous  PONV Risk Score and Plan: 2 and Propofol infusion  Airway Management Planned: Natural Airway  Additional Equipment:   Intra-op Plan:   Post-operative Plan:   Informed Consent: I have reviewed the patients History and Physical, chart, labs and discussed the procedure including the risks, benefits and  alternatives for the proposed anesthesia with the patient or authorized representative who has indicated his/her understanding and acceptance.     Dental advisory given  Plan Discussed with: CRNA and Anesthesiologist  Anesthesia Plan Comments:         Anesthesia Quick Evaluation

## 2019-07-25 ENCOUNTER — Encounter: Payer: Self-pay | Admitting: Internal Medicine

## 2019-07-25 LAB — SURGICAL PATHOLOGY

## 2019-12-30 ENCOUNTER — Ambulatory Visit: Payer: Medicare Other | Attending: Internal Medicine

## 2019-12-30 DIAGNOSIS — Z23 Encounter for immunization: Secondary | ICD-10-CM | POA: Insufficient documentation

## 2019-12-30 NOTE — Progress Notes (Signed)
   Covid-19 Vaccination Clinic  Name:  Stephanie [name]    MRN: [id number] DOB: 1946/06/24  12/30/2019  [name] was observed post Covid-19 immunization for 15 minutes without incidence. She was provided with Vaccine Information Sheet and instruction to access the V-Safe system.   [name] was instructed to call 911 with any severe reactions post vaccine: Marland Kitchen Difficulty breathing  . Swelling of your face and throat  . A fast heartbeat  . A bad rash all over your body  . Dizziness and weakness    Immunizations Administered    Name Date Dose VIS Date Route   Pfizer COVID-19 Vaccine 12/30/2019  8:35 AM 0.3 mL 10/12/2019 Intramuscular   Manufacturer: [place]   Lot: [id number]   [place]: [id number]

## 2020-01-30 ENCOUNTER — Ambulatory Visit: Payer: Medicare Other | Attending: Internal Medicine

## 2020-01-30 DIAGNOSIS — Z23 Encounter for immunization: Secondary | ICD-10-CM

## 2020-01-30 NOTE — Progress Notes (Signed)
   Covid-19 Vaccination Clinic  Name:  Stephanie [name]    MRN: [id number] DOB: [birth year]  01/30/2020  Ms. Ailstock was observed post Covid-19 immunization for 15 minutes without incident. She was provided with Vaccine Information Sheet and instruction to access the V-Safe system.   Ms. Bosscher was instructed to call 911 with any severe reactions post vaccine: Marland Kitchen Difficulty breathing  . Swelling of face and throat  . A fast heartbeat  . A bad rash all over body  . Dizziness and weakness   Immunizations Administered    Name Date Dose VIS Date Route   Pfizer COVID-19 Vaccine 01/30/2020  8:21 AM 0.3 mL 10/12/2019 Intramuscular   Manufacturer: [place]   Lot: [id number]   [place]: [id number]

## 2020-04-14 ENCOUNTER — Other Ambulatory Visit: Payer: Self-pay | Admitting: Internal Medicine

## 2020-04-14 DIAGNOSIS — R270 Ataxia, unspecified: Secondary | ICD-10-CM

## 2020-04-14 DIAGNOSIS — G20 Parkinson's disease: Secondary | ICD-10-CM

## 2020-04-22 ENCOUNTER — Other Ambulatory Visit: Payer: Self-pay

## 2020-04-22 ENCOUNTER — Ambulatory Visit
Admission: RE | Admit: 2020-04-22 | Discharge: 2020-04-22 | Disposition: A | Discharge status: Home / Self Care | Payer: Medicare Other | Source: Ambulatory Visit | Attending: Internal Medicine | Admitting: Internal Medicine

## 2020-04-22 DIAGNOSIS — G20 Parkinson's disease: Secondary | ICD-10-CM | POA: Diagnosis present

## 2020-04-22 DIAGNOSIS — R270 Ataxia, unspecified: Secondary | ICD-10-CM | POA: Diagnosis present

## 2020-11-01 DIAGNOSIS — M109 Gout, unspecified: Secondary | ICD-10-CM | POA: Insufficient documentation

## 2020-11-07 ENCOUNTER — Other Ambulatory Visit (HOSPITAL_COMMUNITY): Payer: Self-pay | Admitting: Internal Medicine

## 2020-11-07 ENCOUNTER — Other Ambulatory Visit: Payer: Self-pay | Admitting: Internal Medicine

## 2020-11-07 DIAGNOSIS — R1084 Generalized abdominal pain: Secondary | ICD-10-CM

## 2020-11-13 ENCOUNTER — Ambulatory Visit
Admission: RE | Admit: 2020-11-13 | Discharge: 2020-11-13 | Disposition: A | Discharge status: Home / Self Care | Payer: Medicare Other | Source: Ambulatory Visit | Attending: Internal Medicine | Admitting: Internal Medicine

## 2020-11-13 ENCOUNTER — Other Ambulatory Visit: Payer: Self-pay

## 2020-11-13 DIAGNOSIS — R1084 Generalized abdominal pain: Secondary | ICD-10-CM | POA: Insufficient documentation

## 2020-12-05 ENCOUNTER — Emergency Department (HOSPITAL_BASED_OUTPATIENT_CLINIC_OR_DEPARTMENT_OTHER)
Admission: EM | Admit: 2020-12-05 | Discharge: 2020-12-05 | Disposition: A | Discharge status: Home / Self Care | Payer: Medicare Other | Attending: Emergency Medicine | Admitting: Emergency Medicine

## 2020-12-05 ENCOUNTER — Encounter (HOSPITAL_BASED_OUTPATIENT_CLINIC_OR_DEPARTMENT_OTHER): Payer: Self-pay | Admitting: Emergency Medicine

## 2020-12-05 ENCOUNTER — Emergency Department (HOSPITAL_BASED_OUTPATIENT_CLINIC_OR_DEPARTMENT_OTHER): Payer: Medicare Other

## 2020-12-05 ENCOUNTER — Other Ambulatory Visit: Payer: Self-pay

## 2020-12-05 DIAGNOSIS — Z7984 Long term (current) use of oral hypoglycemic drugs: Secondary | ICD-10-CM | POA: Insufficient documentation

## 2020-12-05 DIAGNOSIS — Z7982 Long term (current) use of aspirin: Secondary | ICD-10-CM | POA: Insufficient documentation

## 2020-12-05 DIAGNOSIS — E039 Hypothyroidism, unspecified: Secondary | ICD-10-CM | POA: Insufficient documentation

## 2020-12-05 DIAGNOSIS — E11649 Type 2 diabetes mellitus with hypoglycemia without coma: Secondary | ICD-10-CM | POA: Diagnosis not present

## 2020-12-05 DIAGNOSIS — I10 Essential (primary) hypertension: Secondary | ICD-10-CM | POA: Insufficient documentation

## 2020-12-05 DIAGNOSIS — E162 Hypoglycemia, unspecified: Secondary | ICD-10-CM | POA: Diagnosis present

## 2020-12-05 DIAGNOSIS — Z79899 Other long term (current) drug therapy: Secondary | ICD-10-CM | POA: Insufficient documentation

## 2020-12-05 LAB — CBC WITH DIFFERENTIAL/PLATELET
Abs Immature Granulocytes: 0.03 10*3/uL (ref 0.00–0.07)
Basophils Absolute: 0.1 10*3/uL (ref 0.0–0.1)
Basophils Relative: 1 %
Eosinophils Absolute: 0.3 10*3/uL (ref 0.0–0.5)
Eosinophils Relative: 4 %
HCT: 36.3 % (ref 36.0–46.0)
Hemoglobin: 11.6 g/dL — ABNORMAL LOW (ref 12.0–15.0)
Immature Granulocytes: 1 %
Lymphocytes Relative: 19 %
Lymphs Abs: 1.1 10*3/uL (ref 0.7–4.0)
MCH: 30.1 pg (ref 26.0–34.0)
MCHC: 32 g/dL (ref 30.0–36.0)
MCV: 94.3 fL (ref 80.0–100.0)
Monocytes Absolute: 0.6 10*3/uL (ref 0.1–1.0)
Monocytes Relative: 10 %
Neutro Abs: 4 10*3/uL (ref 1.7–7.7)
Neutrophils Relative %: 65 %
Platelets: 302 10*3/uL (ref 150–400)
RBC: 3.85 MIL/uL — ABNORMAL LOW (ref 3.87–5.11)
RDW: 14.8 % (ref 11.5–15.5)
WBC: 6.1 10*3/uL (ref 4.0–10.5)
nRBC: 0 % (ref 0.0–0.2)

## 2020-12-05 LAB — BASIC METABOLIC PANEL
Anion gap: 10 (ref 5–15)
BUN: 26 mg/dL — ABNORMAL HIGH (ref 8–23)
CO2: 28 mmol/L (ref 22–32)
Calcium: 9.2 mg/dL (ref 8.9–10.3)
Chloride: 92 mmol/L — ABNORMAL LOW (ref 98–111)
Creatinine, Ser: 1.04 mg/dL — ABNORMAL HIGH (ref 0.44–1.00)
GFR, Estimated: 56 mL/min — ABNORMAL LOW (ref >60)
Glucose, Bld: 36 mg/dL — CL (ref 70–99)
Potassium: 4.9 mmol/L (ref 3.5–5.1)
Sodium: 130 mmol/L — ABNORMAL LOW (ref 135–145)

## 2020-12-05 LAB — URINALYSIS, ROUTINE W REFLEX MICROSCOPIC
Bilirubin Urine: NEGATIVE
Glucose, UA: NEGATIVE mg/dL
Hgb urine dipstick: NEGATIVE
Ketones, ur: NEGATIVE mg/dL
Leukocytes,Ua: NEGATIVE
Nitrite: NEGATIVE
Protein, ur: NEGATIVE mg/dL
Specific Gravity, Urine: 1.01 (ref 1.005–1.030)
pH: 7.5 (ref 5.0–8.0)

## 2020-12-05 LAB — CBG MONITORING, ED
Glucose-Capillary: 53 mg/dL — ABNORMAL LOW (ref 70–99)
Glucose-Capillary: 37 mg/dL — CL (ref 70–99)
Glucose-Capillary: 143 mg/dL — ABNORMAL HIGH (ref 70–99)
Glucose-Capillary: 63 mg/dL — ABNORMAL LOW (ref 70–99)
Glucose-Capillary: 93 mg/dL (ref 70–99)
Glucose-Capillary: 115 mg/dL — ABNORMAL HIGH (ref 70–99)

## 2020-12-05 MED ORDER — DEXTROSE 10 % IV BOLUS: 500.0000 mL | Freq: Once | INTRAVENOUS | Status: DC

## 2020-12-05 MED ORDER — DEXTROSE 50 % IV SOLN
50.0000 mL | INTRAVENOUS | Status: AC
  Administered 2020-12-05 (×2): 50 mL via INTRAVENOUS
  Filled 2020-12-05 (×2): qty 50

## 2020-12-05 MED ORDER — OXYCODONE-ACETAMINOPHEN 5-325 MG PO TABS
1.0000 | ORAL_TABLET | Freq: Once | ORAL | Status: AC
  Administered 2020-12-05: 1 via ORAL
  Filled 2020-12-05: qty 1

## 2020-12-05 NOTE — Discharge Instructions (Addendum)
Please hold all diabetes medications (glimepiride, metformin and pioglitazone) for the next 2 days. Follow up with your PCP next week to discuss a new diabetes medication regimen. Return to the ER if your sugar drops again.

## 2020-12-05 NOTE — ED Triage Notes (Addendum)
Reports BS has been running low since yesterday morning.  Reports it has been in the 40's today.  PCP seen pt yesterday.  Took off the glimepride.   CBG 53 in triage.  Per patient has been eating and drinking everything she can for two days to get sugar up.

## 2020-12-05 NOTE — ED Notes (Signed)
Patient transported to X-ray 

## 2020-12-05 NOTE — ED Provider Notes (Signed)
[facility] EMERGENCY DEPARTMENT Provider Note  CSN: [id number] Arrival date & time: 12/05/20 1511    History Chief Complaint  Patient presents with  . Hypoglycemia    HPI  Stephanie [name] is a 75 y.o. female with history of DM on oral meds was recently admitted at [facility] for non-Covid pneumonia, discharged 5 days ago and has since been living with her daughter who reports her glucose has been low the last 2 days. She was obtunded with glucose in the 30s yesterday. Seen at PCP and had her glimepiride stopped, daughter also stopped metformin, but still taking Actos. EMS came to the house once and gave her some glucose with temporary improvement but was low again today despite increased sugar intake.    Past Medical History:  Diagnosis Date  . Anemia   . Arthritis   . Chronic back pain   . Diabetes mellitus without complication ([place])   . Dysrhythmia   . GERD (gastroesophageal reflux disease)   . Hyperlipidemia   . Hypertension   . Hypothyroid   . Sleep apnea   . Syncope   . Tremors of nervous system     Past Surgical History:  Procedure Laterality Date  . ABDOMINAL HYSTERECTOMY    . BACK SURGERY  09/16  . CARDIAC CATHETERIZATION    . CATARACT EXTRACTION W/PHACO Right 11/24/2015   Procedure: CATARACT EXTRACTION PHACO AND INTRAOCULAR LENS PLACEMENT (IOC);  Surgeon: Estill Cotta, MD;  Location: [facility] ORS;  Service: Ophthalmology;  Laterality: Right;  [place] 01:14 AP% 25.5 CDE 30.69 fluid pack lot # [id number]  . COLONOSCOPY    . COLONOSCOPY WITH PROPOFOL N/A 07/24/2019   Procedure: COLONOSCOPY WITH PROPOFOL;  Surgeon: Toledo, Benay Pike, MD;  Location: [facility] ENDOSCOPY;  Service: Gastroenterology;  Laterality: N/A;  . ESOPHAGOGASTRODUODENOSCOPY N/A 07/24/2019   Procedure: ESOPHAGOGASTRODUODENOSCOPY (EGD);  Surgeon: Toledo, Benay Pike, MD;  Location: [facility] ENDOSCOPY;  Service: Gastroenterology;  Laterality: N/A;  . OOPHORECTOMY      Family History  Problem Relation  Age of Onset  . Breast cancer Paternal Aunt     Social History   Tobacco Use  . Smoking status: Never Smoker  . Smokeless tobacco: Never Used  Substance Use Topics  . Alcohol use: Yes  . Drug use: No     Home Medications Prior to Admission medications   Medication Sig Start Date End Date Taking? Authorizing Provider  allopurinol (ZYLOPRIM) 100 MG tablet Take 100 mg by mouth daily.    [provider]  Aspirin-Acetaminophen-Caffeine (GOODY HEADACHE PO) Take by mouth.    [provider]  carbidopa-levodopa (SINEMET IR) 25-100 MG tablet Take 2.5 tablets by mouth 4 (four) times daily.    [provider]  colchicine 0.6 MG tablet Take 1 tablet (0.6 mg total) by mouth daily. 09/30/17 10/30/17  Gregor Hams, MD  docusate sodium (COLACE) 100 MG capsule Take 100 mg by mouth 2 (two) times daily.    [provider]  enalapril (VASOTEC) 20 MG tablet Take 20 mg by mouth 2 (two) times daily.     [provider]  ferrous sulfate 325 (65 FE) MG tablet Take 325 mg by mouth daily with breakfast.    [provider]  fluticasone (FLONASE) 50 MCG/ACT nasal spray Place 1-2 sprays into the nose as needed. 09/29/15   [provider]  furosemide (LASIX) 40 MG tablet Take 40 mg by mouth daily.    [provider]  gabapentin (NEURONTIN) 300 MG capsule Take 300  mg by mouth at bedtime.    [provider]  levothyroxine (SYNTHROID, LEVOTHROID) 25 MCG tablet Take 25 mcg by mouth daily before breakfast.    [provider]  meloxicam (MOBIC) 15 MG tablet Take 15 mg by mouth daily.    [provider]  Multiple Vitamin (MULTIVITAMIN WITH MINERALS) TABS tablet Take 1 tablet by mouth daily.    [provider]  oxyCODONE-acetaminophen (PERCOCET/ROXICET) 5-325 MG tablet Take 1 tablet by mouth every 6 (six) hours as needed for severe pain.    [provider]  pantoprazole (PROTONIX) 40 MG tablet Take 1  tablet (40 mg total) by mouth daily. 01/02/16   Bettey Costa, MD  pravastatin (PRAVACHOL) 40 MG tablet Take 40 mg by mouth daily.    [provider]  spironolactone (ALDACTONE) 50 MG tablet Take 50 mg by mouth 2 (two) times daily.    [provider]  glimepiride (AMARYL) 4 MG tablet Take 4 mg by mouth 2 (two) times daily. Pt. Takes 1 tablet in the morning and .5 tablet at night.  12/05/20  [provider]  metFORMIN (GLUCOPHAGE) 1000 MG tablet Take 1,000 mg by mouth 2 (two) times daily with a meal.   12/05/20  [provider]  pioglitazone (ACTOS) 30 MG tablet Take 30 mg by mouth daily.  12/05/20  [provider]     Allergies    Patient has no known allergies.   Review of Systems   Review of Systems A comprehensive review of systems was completed and negative except as noted in HPI.    Physical Exam BP (!) 168/72 (BP Location: Left Arm)   Pulse 97   Temp 98.4 F (36.9 C) (Oral)   Resp 17   Ht 5\' 1"  (1.549 m)   Wt 90.7 kg   SpO2 98%   BMI 37.78 kg/m   Physical Exam Vitals and nursing note reviewed.  Constitutional:      Appearance: Normal appearance.  HENT:     Head: Normocephalic and atraumatic.     Nose: Nose normal.     Mouth/Throat:     Mouth: Mucous membranes are moist.  Eyes:     Extraocular Movements: Extraocular movements intact.     Conjunctiva/sclera: Conjunctivae normal.  Cardiovascular:     Rate and Rhythm: Normal rate.  Pulmonary:     Effort: Pulmonary effort is normal.     Breath sounds: Normal breath sounds.  Abdominal:     General: Abdomen is flat.     Palpations: Abdomen is soft.     Tenderness: There is no abdominal tenderness.  Musculoskeletal:        General: No swelling. Normal range of motion.     Cervical back: Neck supple.     Right lower leg: Edema present.     Left lower leg: Edema present.     Comments: BLE edema with mild erythema, no warmth  Skin:    General: Skin is warm and dry.   Neurological:     General: No focal deficit present.     Mental Status: She is alert.  Psychiatric:        Mood and Affect: Mood normal.      ED Results / Procedures / Treatments   Labs (all labs ordered are listed, but only abnormal results are displayed) Labs Reviewed  BASIC METABOLIC PANEL - Abnormal; Notable for the following components:      Result Value   Sodium 130 (*)    Chloride  92 (*)    Glucose, Bld 36 (*)    BUN 26 (*)    Creatinine, Ser 1.04 (*)    GFR, Estimated 56 (*)    All other components within normal limits  CBC WITH DIFFERENTIAL/PLATELET - Abnormal; Notable for the following components:   RBC 3.85 (*)    Hemoglobin 11.6 (*)    All other components within normal limits  CBG MONITORING, ED - Abnormal; Notable for the following components:   Glucose-Capillary 53 (*)    All other components within normal limits  CBG MONITORING, ED - Abnormal; Notable for the following components:   Glucose-Capillary 37 (*)    All other components within normal limits  CBG MONITORING, ED - Abnormal; Notable for the following components:   Glucose-Capillary 143 (*)    All other components within normal limits  CBG MONITORING, ED - Abnormal; Notable for the following components:   Glucose-Capillary 63 (*)    All other components within normal limits  CBG MONITORING, ED - Abnormal; Notable for the following components:   Glucose-Capillary 115 (*)    All other components within normal limits  URINALYSIS, ROUTINE W REFLEX MICROSCOPIC  CBG MONITORING, ED    EKG None   Radiology DG Chest 2 View  Result Date: 12/05/2020 CLINICAL DATA:  Hypoglycemia, recent pneumonia EXAM: CHEST - 2 VIEW COMPARISON:  12/31/2015 FINDINGS: Enlargement of cardiac silhouette. Mediastinal contours and pulmonary vascularity normal. Mild elevation of RIGHT diaphragm versus previous exam. Subsegmental atelectasis RIGHT base and minimal stable LEFT base scarring. Additional patchy opacities are seen  in the lower lungs bilaterally concerning for pneumonia. No pleural effusion or pneumothorax. Bones demineralized. IMPRESSION: Enlargement of cardiac silhouette. Elevation of RIGHT diaphragm with mild RIGHT basilar atelectasis. Additional bibasilar opacities greater on RIGHT question pneumonia. Electronically Signed   By: Lavonia Dana M.D.   On: 12/05/2020 16:20    Procedures Procedures  Medications Ordered in the ED Medications  dextrose 50 % solution 50 mL (50 mLs Intravenous Given 12/05/20 1631)  oxyCODONE-acetaminophen (PERCOCET/ROXICET) 5-325 MG per tablet 1 tablet (1 tablet Oral Given 12/05/20 1819)     MDM Rules/Calculators/A&P MDM Glucose is low here, but patient is awake and alert. Will check labs, including CBC, CMP and UA and recheck CXR given recent pneumonia. PO and IV glucose in the meantime.  ED Course  I have reviewed the triage vital signs and the nursing notes.  Pertinent labs & imaging results that were available during my care of the patient were reviewed by me and considered in my medical decision making (see chart for details).  Clinical Course as of 12/05/20 2329  Fri Dec 05, 2020  1638 CBC with normal WBC, mild anemia. Repeat glucose after first amp of D50 was lower, will give a second amp. D10 is not available at this facility.  [CS]  1704 Glucose improved now. Will continue to monitor.  [CS]  1726 BMP demonstrates hypoglycemia present when blood was drawn, Creatinine is normal.  [CS]  1817 Glucose still trending down. She is going to eat some dinner while waiting for UA. If continues to trend down, will likely need admission for further monitoring.  [CS]  2013 Blood sugar is holding steady in the 90-120 range. Patient is eager to go home. Advised to hold all BP meds and follow up with PCP next week for recheck. RTED if glucose drops again.  [CS]    Clinical Course User Index [CS] Truddie Hidden, MD    Final Clinical Impression(s) /  ED Diagnoses Final  diagnoses:  Hypoglycemia    Rx / DC Orders ED Discharge Orders    None       Truddie Hidden, MD 12/05/20 2329

## 2020-12-30 ENCOUNTER — Other Ambulatory Visit: Payer: Self-pay

## 2020-12-30 ENCOUNTER — Encounter: Payer: Self-pay | Admitting: Ophthalmology

## 2021-01-02 ENCOUNTER — Other Ambulatory Visit
Admission: RE | Admit: 2021-01-02 | Discharge: 2021-01-02 | Disposition: A | Discharge status: Home / Self Care | Payer: Medicare Other | Source: Ambulatory Visit | Attending: Ophthalmology | Admitting: Ophthalmology

## 2021-01-02 ENCOUNTER — Other Ambulatory Visit: Payer: Self-pay

## 2021-01-02 DIAGNOSIS — Z01812 Encounter for preprocedural laboratory examination: Secondary | ICD-10-CM | POA: Insufficient documentation

## 2021-01-02 DIAGNOSIS — Z20822 Contact with and (suspected) exposure to covid-19: Secondary | ICD-10-CM | POA: Diagnosis not present

## 2021-01-02 NOTE — Discharge Instructions (Signed)

## 2021-01-03 LAB — SARS CORONAVIRUS 2 (TAT 6-24 HRS): SARS Coronavirus 2: NEGATIVE

## 2021-01-06 ENCOUNTER — Encounter
Admission: RE | Disposition: A | Discharge status: Home / Self Care | Payer: Self-pay | Source: Home / Self Care | Attending: Ophthalmology

## 2021-01-06 ENCOUNTER — Other Ambulatory Visit: Payer: Self-pay

## 2021-01-06 ENCOUNTER — Ambulatory Visit: Payer: Medicare Other | Admitting: Anesthesiology

## 2021-01-06 ENCOUNTER — Ambulatory Visit
Admission: RE | Admit: 2021-01-06 | Discharge: 2021-01-06 | Disposition: A | Discharge status: Home / Self Care | Payer: Medicare Other | Attending: Ophthalmology | Admitting: Ophthalmology

## 2021-01-06 ENCOUNTER — Encounter: Payer: Self-pay | Admitting: Ophthalmology

## 2021-01-06 DIAGNOSIS — Z7984 Long term (current) use of oral hypoglycemic drugs: Secondary | ICD-10-CM | POA: Insufficient documentation

## 2021-01-06 DIAGNOSIS — Z791 Long term (current) use of non-steroidal anti-inflammatories (NSAID): Secondary | ICD-10-CM | POA: Diagnosis not present

## 2021-01-06 DIAGNOSIS — E1136 Type 2 diabetes mellitus with diabetic cataract: Secondary | ICD-10-CM | POA: Diagnosis not present

## 2021-01-06 DIAGNOSIS — G20 Parkinson's disease: Secondary | ICD-10-CM | POA: Diagnosis not present

## 2021-01-06 DIAGNOSIS — Z7982 Long term (current) use of aspirin: Secondary | ICD-10-CM | POA: Insufficient documentation

## 2021-01-06 DIAGNOSIS — H2512 Age-related nuclear cataract, left eye: Secondary | ICD-10-CM | POA: Diagnosis not present

## 2021-01-06 HISTORY — PX: CATARACT EXTRACTION W/PHACO: SHX586

## 2021-01-06 HISTORY — DX: Presence of dental prosthetic device (complete) (partial): Z97.2

## 2021-01-06 HISTORY — DX: Parkinson's disease: G20

## 2021-01-06 HISTORY — DX: Dorsopathy, unspecified: M53.9

## 2021-01-06 HISTORY — DX: Parkinson's disease without dyskinesia, without mention of fluctuations: G20.A1

## 2021-01-06 LAB — GLUCOSE, CAPILLARY: Glucose-Capillary: 151 mg/dL — ABNORMAL HIGH (ref 70–99)

## 2021-01-06 SURGERY — PHACOEMULSIFICATION, CATARACT, WITH IOL INSERTION
Anesthesia: Monitor Anesthesia Care | Site: Eye | Laterality: Left

## 2021-01-06 MED ORDER — LIDOCAINE HCL (PF) 2 % IJ SOLN
INTRAOCULAR | Status: DC | PRN
  Administered 2021-01-06: 2 mL

## 2021-01-06 MED ORDER — FENTANYL CITRATE (PF) 100 MCG/2ML IJ SOLN
INTRAMUSCULAR | Status: DC | PRN
  Administered 2021-01-06: 50 ug via INTRAVENOUS

## 2021-01-06 MED ORDER — MOXIFLOXACIN HCL 0.5 % OP SOLN
OPHTHALMIC | Status: DC | PRN
  Administered 2021-01-06: 0.2 mL via OPHTHALMIC

## 2021-01-06 MED ORDER — BRIMONIDINE TARTRATE-TIMOLOL 0.2-0.5 % OP SOLN
OPHTHALMIC | Status: DC | PRN
  Administered 2021-01-06: 1 [drp] via OPHTHALMIC

## 2021-01-06 MED ORDER — EPINEPHRINE PF 1 MG/ML IJ SOLN
INTRAOCULAR | Status: DC | PRN
  Administered 2021-01-06: 55 mL via OPHTHALMIC

## 2021-01-06 MED ORDER — MIDAZOLAM HCL 2 MG/2ML IJ SOLN
INTRAMUSCULAR | Status: DC | PRN
  Administered 2021-01-06: 1 mg via INTRAVENOUS

## 2021-01-06 MED ORDER — CYCLOPENTOLATE HCL 2 % OP SOLN
1.0000 [drp] | OPHTHALMIC | Status: DC | PRN
  Administered 2021-01-06 (×3): 1 [drp] via OPHTHALMIC

## 2021-01-06 MED ORDER — TETRACAINE HCL 0.5 % OP SOLN
1.0000 [drp] | OPHTHALMIC | Status: DC | PRN
  Administered 2021-01-06 (×3): 1 [drp] via OPHTHALMIC

## 2021-01-06 MED ORDER — NA CHONDROIT SULF-NA HYALURON 40-17 MG/ML IO SOLN
INTRAOCULAR | Status: DC | PRN
  Administered 2021-01-06: 1 mL via INTRAOCULAR

## 2021-01-06 MED ORDER — PHENYLEPHRINE HCL 10 % OP SOLN
1.0000 [drp] | OPHTHALMIC | Status: DC | PRN
  Administered 2021-01-06 (×3): 1 [drp] via OPHTHALMIC

## 2021-01-06 SURGICAL SUPPLY — 19 items
CANNULA ANT/CHMB 27G (MISCELLANEOUS) ×2 IMPLANT
CANNULA ANT/CHMB 27GA (MISCELLANEOUS) ×4 IMPLANT
GLOVE SURG LX 8.0 MICRO (GLOVE) ×1
GLOVE SURG LX STRL 8.0 MICRO (GLOVE) ×1 IMPLANT
GLOVE SURG TRIUMPH 8.0 PF LTX (GLOVE) ×2 IMPLANT
GOWN STRL REUS W/ TWL LRG LVL3 (GOWN DISPOSABLE) ×2 IMPLANT
GOWN STRL REUS W/TWL LRG LVL3 (GOWN DISPOSABLE) ×4
LENS IOL ACRYSOF IQ 21.5 (Intraocular Lens) ×1 IMPLANT
MARKER SKIN DUAL TIP RULER LAB (MISCELLANEOUS) ×2 IMPLANT
NDL FILTER BLUNT 18X1 1/2 (NEEDLE) ×1 IMPLANT
NEEDLE FILTER BLUNT 18X 1/2SAF (NEEDLE) ×1
NEEDLE FILTER BLUNT 18X1 1/2 (NEEDLE) ×1 IMPLANT
PACK EYE AFTER SURG (MISCELLANEOUS) ×2 IMPLANT
PACK OPTHALMIC (MISCELLANEOUS) ×2 IMPLANT
PACK PORFILIO (MISCELLANEOUS) ×2 IMPLANT
SYR 3ML LL SCALE MARK (SYRINGE) ×2 IMPLANT
SYR TB 1ML LUER SLIP (SYRINGE) ×2 IMPLANT
WATER STERILE IRR 250ML POUR (IV SOLUTION) ×2 IMPLANT
WIPE NON LINTING 3.25X3.25 (MISCELLANEOUS) ×2 IMPLANT

## 2021-01-06 NOTE — Anesthesia Procedure Notes (Signed)
Procedure Name: [place] Performed by: Cameron Ali, CRNA Pre-anesthesia Checklist: Patient identified, Emergency Drugs available, Suction available, Timeout performed and Patient being monitored Patient Re-evaluated:Patient Re-evaluated prior to induction Oxygen Delivery Method: Nasal cannula Placement Confirmation: positive ETCO2

## 2021-01-06 NOTE — Anesthesia Preprocedure Evaluation (Signed)
Anesthesia Evaluation  Patient identified by MRN, date of birth, ID band Patient awake    History of Anesthesia Complications Negative for: history of anesthetic complications  Airway Mallampati: II  TM Distance: >3 FB Neck ROM: Full    Dental no notable dental hx.    Pulmonary sleep apnea ,    Pulmonary exam normal        Cardiovascular Exercise Tolerance: Poor hypertension, Pt. on home beta blockers and Pt. on medications Normal cardiovascular exam  Summary   1. The left ventricle is upper normal in size with mildly increased wall  thickness.   2. The left ventricular systolic function is normal, LVEF is visually  estimated at 55%.   3. Mitral annular calcification is present (mild).   4. The mitral valve leaflets are mildly thickened with normal leaflet  mobility.   5. There is mild mitral valve regurgitation.   6. The aortic valve is probably trileaflet with mildly thickened leaflets  with normal excursion.   7. The left atrium is moderately dilated in size.   8. There is moderate pulmonary hypertension.   9. The right ventricle is upper normal in size, with normal systolic  function.   10. The right atrium is mildly dilated in size    LBBB on EKG   Neuro/Psych Parkinson's disease    GI/Hepatic negative GI ROS, Neg liver ROS,   Endo/Other  diabetes, Poorly Controlled, Type 2Hypothyroidism BMI 35  Renal/GU      Musculoskeletal   Abdominal   Peds  Hematology   Anesthesia Other Findings   Reproductive/Obstetrics                           Anesthesia Physical Anesthesia Plan  ASA: III  Anesthesia Plan: MAC   Post-op Pain Management:    Induction: Intravenous  PONV Risk Score and Plan: 2 and TIVA, Midazolam and Treatment may vary due to age or medical condition  Airway Management Planned: Nasal Cannula and Natural Airway  Additional Equipment: None  Intra-op  Plan:   Post-operative Plan:   Informed Consent: I have reviewed the patients History and Physical, chart, labs and discussed the procedure including the risks, benefits and alternatives for the proposed anesthesia with the patient or authorized representative who has indicated his/her understanding and acceptance.       Plan Discussed with: CRNA  Anesthesia Plan Comments:         Anesthesia Quick Evaluation

## 2021-01-06 NOTE — Anesthesia Postprocedure Evaluation (Signed)
Anesthesia Post Note  Patient: Stephanie [name]  Procedure(s) Performed: CATARACT EXTRACTION PHACO AND INTRAOCULAR LENS PLACEMENT (IOC) LEFT DIABETIC 6.98 00:40.1 (Left Eye)     Patient location during evaluation: PACU Anesthesia Type: MAC Level of consciousness: awake and alert Pain management: pain level controlled Vital Signs Assessment: post-procedure vital signs reviewed and stable Respiratory status: spontaneous breathing Cardiovascular status: blood pressure returned to baseline Postop Assessment: no apparent nausea or vomiting, adequate PO intake and no headache Anesthetic complications: no   No complications documented.  Adele Barthel Keefer Soulliere

## 2021-01-06 NOTE — Op Note (Signed)
PREOPERATIVE DIAGNOSIS:  Nuclear sclerotic cataract of the left eye.   POSTOPERATIVE DIAGNOSIS:  Nuclear sclerotic cataract of the left eye.   OPERATIVE PROCEDURE:@   SURGEON:  Birder Robson, MD.   ANESTHESIA:  Anesthesiologist: Page, Adele Barthel, MD CRNA: Cameron Ali, CRNA  1.      Managed anesthesia care. 2.     0.79ml of Shugarcaine was instilled following the paracentesis   COMPLICATIONS:  None.   TECHNIQUE:   Stop and chop   DESCRIPTION OF PROCEDURE:  The patient was examined and consented in the preoperative holding area where the aforementioned topical anesthesia was applied to the left eye and then brought back to the Operating Room where the left eye was prepped and draped in the usual sterile ophthalmic fashion and a lid speculum was placed. A paracentesis was created with the side port blade and the anterior chamber was filled with viscoelastic. A near clear corneal incision was performed with the steel keratome. A continuous curvilinear capsulorrhexis was performed with a cystotome followed by the capsulorrhexis forceps. Hydrodissection and hydrodelineation were carried out with BSS on a blunt cannula. The lens was removed in a stop and chop  technique and the remaining cortical material was removed with the irrigation-aspiration handpiece. The capsular bag was inflated with viscoelastic and the Technis ZCB00 lens was placed in the capsular bag without complication. The remaining viscoelastic was removed from the eye with the irrigation-aspiration handpiece. The wounds were hydrated. The anterior chamber was flushed with BSS and the eye was inflated to physiologic pressure. 0.63ml Vigamox was placed in the anterior chamber. The wounds were found to be water tight. The eye was dressed with Combigan. The patient was given protective glasses to wear throughout the day and a shield with which to sleep tonight. The patient was also given drops with which to begin a drop regimen today and  will follow-up with me in one day. Implant Name Type Inv. Item Serial No. Manufacturer Lot No. LRB No. Used Action  LENS IOL ACRYSOF IQ 21.5 - [id number] Intraocular Lens LENS IOL ACRYSOF IQ 21.5 [id number] ALCON  Left 1 Implanted    Procedure(s) with comments: CATARACT EXTRACTION PHACO AND INTRAOCULAR LENS PLACEMENT (IOC) LEFT DIABETIC 6.98 00:40.1 (Left) - Diabetic - oral meds  Electronically signed: Birder Robson 01/06/2021 11:36 AM

## 2021-01-06 NOTE — Transfer of Care (Signed)
Immediate Anesthesia Transfer of Care Note  Patient: Stephanie [name]  Procedure(s) Performed: CATARACT EXTRACTION PHACO AND INTRAOCULAR LENS PLACEMENT (IOC) LEFT DIABETIC 6.98 00:40.1 (Left Eye)  Patient Location: PACU  Anesthesia Type: MAC  Level of Consciousness: awake, alert  and patient cooperative  Airway and Oxygen Therapy: Patient Spontanous Breathing and Patient connected to supplemental oxygen  Post-op Assessment: Post-op Vital signs reviewed, Patient's Cardiovascular Status Stable, Respiratory Function Stable, Patent Airway and No signs of Nausea or vomiting  Post-op Vital Signs: Reviewed and stable  Complications: No complications documented.

## 2021-01-06 NOTE — H&P (Signed)
[facility]   Primary Care Physician:  [name], MD Ophthalmologist: Dr. George Ina  Pre-Procedure History & Physical: HPI:  Stephanie [name] is a 75 y.o. female here for cataract surgery.   Past Medical History:  Diagnosis Date  . Anemia   . Arthritis   . Chronic back pain   . Diabetes mellitus without complication ([place])   . Dysrhythmia   . GERD (gastroesophageal reflux disease)   . Hyperlipidemia   . Hypertension   . Hypothyroid   . Multilevel degenerative disc disease   . Parkinson's disease ([place])   . Sleep apnea   . Syncope   . Tremors of nervous system   . Wears dentures    full upper and lower    Past Surgical History:  Procedure Laterality Date  . ABDOMINAL HYSTERECTOMY    . BACK SURGERY  09/16  . CARDIAC CATHETERIZATION    . CATARACT EXTRACTION W/PHACO Right 11/24/2015   Procedure: CATARACT EXTRACTION PHACO AND INTRAOCULAR LENS PLACEMENT (IOC);  Surgeon: Estill Cotta, MD;  Location: [facility] ORS;  Service: Ophthalmology;  Laterality: Right;  [place] 01:14 AP% 25.5 CDE 30.69 fluid pack lot # [id number]  . COLONOSCOPY    . COLONOSCOPY WITH PROPOFOL N/A 07/24/2019   Procedure: COLONOSCOPY WITH PROPOFOL;  Surgeon: Toledo, Benay Pike, MD;  Location: [facility] ENDOSCOPY;  Service: Gastroenterology;  Laterality: N/A;  . ESOPHAGOGASTRODUODENOSCOPY N/A 07/24/2019   Procedure: ESOPHAGOGASTRODUODENOSCOPY (EGD);  Surgeon: Toledo, Benay Pike, MD;  Location: [facility] ENDOSCOPY;  Service: Gastroenterology;  Laterality: N/A;  . OOPHORECTOMY      Prior to Admission medications   Medication Sig Start Date End Date Taking? Authorizing Provider  allopurinol (ZYLOPRIM) 100 MG tablet Take 100 mg by mouth daily.   Yes [provider]  amantadine (SYMMETREL) 100 MG capsule Take 100 mg by mouth 2 (two) times daily.   Yes [provider]  Aspirin-Acetaminophen-Caffeine (GOODY HEADACHE PO) Take by mouth.   Yes [provider]  carbidopa-levodopa (SINEMET CR)  50-200 MG tablet Take 1 tablet by mouth at bedtime.   Yes [provider]  carbidopa-levodopa (SINEMET IR) 25-100 MG tablet Take 2.5 tablets by mouth 4 (four) times daily.   Yes [provider]  Continuous Blood Gluc Sensor (FREESTYLE LIBRE 14 DAY SENSOR) MISC by Does not apply route.   Yes [provider]  Cyanocobalamin (VITAMIN B-12 PO) Take by mouth daily.   Yes [provider]  docusate sodium (COLACE) 100 MG capsule Take 100 mg by mouth 2 (two) times daily.   Yes [provider]  enalapril (VASOTEC) 20 MG tablet Take 20 mg by mouth 2 (two) times daily.    Yes [provider]  ferrous sulfate 325 (65 FE) MG tablet Take 325 mg by mouth daily with breakfast.   Yes [provider]  fluticasone (FLONASE) 50 MCG/ACT nasal spray Place 1-2 sprays into the nose as needed. 09/29/15  Yes [provider]  furosemide (LASIX) 40 MG tablet Take 40 mg by mouth daily as needed.   Yes [provider]  gabapentin (NEURONTIN) 300 MG capsule Take 300 mg by mouth at bedtime.   Yes [provider]  glucose 4 GM chewable tablet Chew 1 tablet by mouth as needed for low blood sugar.   Yes [provider]  levothyroxine (SYNTHROID, LEVOTHROID) 25 MCG tablet Take 25 mcg by mouth daily before breakfast.   Yes [provider]  MAGNESIUM PO Take by mouth daily.   Yes [provider]  metFORMIN (GLUCOPHAGE) 1000 MG tablet Take 1,000 mg by mouth 2 (two) times daily with a meal.   Yes [provider]  metoprolol succinate (TOPROL-XL) 50 MG 24 hr tablet Take 50 mg by mouth daily. Take with or immediately following a meal.   Yes [provider]  Multiple Vitamin (MULTIVITAMIN WITH MINERALS) TABS tablet Take 1 tablet by mouth daily.   Yes [provider]  oxyCODONE-acetaminophen (PERCOCET/ROXICET) 5-325 MG tablet Take 1 tablet by mouth every 6 (six) hours as needed for severe pain.   Yes  [provider]  OXYGEN Inhale into the lungs as needed.   Yes [provider]  pramipexole (MIRAPEX) 0.125 MG tablet Take 0.125 mg by mouth 4 (four) times daily as needed.   Yes [provider]  pravastatin (PRAVACHOL) 40 MG tablet Take 40 mg by mouth daily.   Yes [provider]  colchicine 0.6 MG tablet Take 1 tablet (0.6 mg total) by mouth daily. 09/30/17 10/30/17  Gregor Hams, MD  meloxicam (MOBIC) 15 MG tablet Take 15 mg by mouth daily. Patient not taking: Reported on 12/30/2020    [provider]  pantoprazole (PROTONIX) 40 MG tablet Take 1 tablet (40 mg total) by mouth daily. Patient not taking: Reported on 12/30/2020 01/02/16   Bettey Costa, MD  spironolactone (ALDACTONE) 50 MG tablet Take 50 mg by mouth 2 (two) times daily. Patient not taking: Reported on 12/30/2020    [provider]  glimepiride (AMARYL) 4 MG tablet Take 4 mg by mouth 2 (two) times daily. Pt. Takes 1 tablet in the morning and .5 tablet at night.  12/05/20  [provider]  pioglitazone (ACTOS) 30 MG tablet Take 30 mg by mouth daily.  12/05/20  [provider]    Allergies as of 10/17/2020  . (No Known Allergies)    Family History  Problem Relation Age of Onset  . Breast cancer Paternal Aunt     Social History   Socioeconomic History  . Marital status: Divorced    Spouse name: Not on file  . Number of children: Not on file  . Years of education: Not on file  . Highest education level: Not on file  Occupational History  . Not on file  Tobacco Use  . Smoking status: Never Smoker  . Smokeless tobacco: Never Used  Vaping Use  . Vaping Use: Never used  Substance and Sexual Activity  . Alcohol use: Yes    Comment: rare  . Drug use: No  . Sexual activity: Not Currently  Other Topics Concern  . Not on file  Social History Narrative  . Not on file   Social Determinants of Health   Financial Resource Strain: Not on file  Food  Insecurity: Not on file  Transportation Needs: Not on file  Physical Activity: Not on file  Stress: Not on file  Social Connections: Not on file  Intimate Partner Violence: Not on file    Review of Systems: See HPI, otherwise negative ROS  Physical Exam: BP (!) 111/98   Pulse 65   Temp 97.8 F (36.6 C) (Temporal)   Ht 5\' 1"  (1.549 m)   Wt 84.8 kg   SpO2 97%   BMI 35.33 kg/m  General:   Alert,  pleasant and cooperative in NAD Head:  Normocephalic and atraumatic. Respiratory:  Normal work of breathing.  Impression/Plan: Stephanie [name] is here for cataract surgery.  Risks, benefits, limitations, and alternatives regarding cataract surgery have been reviewed  with the patient.  Questions have been answered.  All parties agreeable.   Birder Robson, MD  01/06/2021, 11:13 AM

## 2021-01-07 ENCOUNTER — Encounter: Payer: Self-pay | Admitting: Ophthalmology

## 2021-05-18 ENCOUNTER — Other Ambulatory Visit (HOSPITAL_COMMUNITY): Payer: Self-pay | Admitting: Neurology

## 2021-05-18 ENCOUNTER — Other Ambulatory Visit: Payer: Self-pay | Admitting: Neurology

## 2021-05-18 DIAGNOSIS — R296 Repeated falls: Secondary | ICD-10-CM

## 2021-05-19 ENCOUNTER — Other Ambulatory Visit: Payer: Self-pay | Admitting: Neurology

## 2021-05-19 DIAGNOSIS — R296 Repeated falls: Secondary | ICD-10-CM

## 2021-06-05 ENCOUNTER — Other Ambulatory Visit: Payer: Self-pay

## 2021-06-05 ENCOUNTER — Ambulatory Visit
Admission: RE | Admit: 2021-06-05 | Discharge: 2021-06-05 | Disposition: A | Discharge status: Home / Self Care | Payer: Medicare Other | Source: Ambulatory Visit | Attending: Neurology | Admitting: Neurology

## 2021-06-05 DIAGNOSIS — R296 Repeated falls: Secondary | ICD-10-CM | POA: Insufficient documentation

## 2021-07-06 ENCOUNTER — Other Ambulatory Visit: Payer: Self-pay

## 2021-07-06 ENCOUNTER — Emergency Department (HOSPITAL_COMMUNITY): Payer: Medicare Other

## 2021-07-06 ENCOUNTER — Emergency Department (HOSPITAL_COMMUNITY)
Admission: EM | Admit: 2021-07-06 | Discharge: 2021-07-06 | Disposition: A | Discharge status: Home / Self Care | Payer: Medicare Other | Attending: Emergency Medicine | Admitting: Emergency Medicine

## 2021-07-06 DIAGNOSIS — Y92009 Unspecified place in unspecified non-institutional (private) residence as the place of occurrence of the external cause: Secondary | ICD-10-CM | POA: Diagnosis not present

## 2021-07-06 DIAGNOSIS — S161XXA Strain of muscle, fascia and tendon at neck level, initial encounter: Secondary | ICD-10-CM

## 2021-07-06 DIAGNOSIS — Z7984 Long term (current) use of oral hypoglycemic drugs: Secondary | ICD-10-CM | POA: Insufficient documentation

## 2021-07-06 DIAGNOSIS — S0993XA Unspecified injury of face, initial encounter: Secondary | ICD-10-CM | POA: Insufficient documentation

## 2021-07-06 DIAGNOSIS — E039 Hypothyroidism, unspecified: Secondary | ICD-10-CM | POA: Diagnosis not present

## 2021-07-06 DIAGNOSIS — R0602 Shortness of breath: Secondary | ICD-10-CM | POA: Diagnosis not present

## 2021-07-06 DIAGNOSIS — Z7952 Long term (current) use of systemic steroids: Secondary | ICD-10-CM | POA: Diagnosis not present

## 2021-07-06 DIAGNOSIS — G20 Parkinson's disease: Secondary | ICD-10-CM | POA: Diagnosis not present

## 2021-07-06 DIAGNOSIS — W010XXA Fall on same level from slipping, tripping and stumbling without subsequent striking against object, initial encounter: Secondary | ICD-10-CM | POA: Diagnosis not present

## 2021-07-06 DIAGNOSIS — E119 Type 2 diabetes mellitus without complications: Secondary | ICD-10-CM | POA: Diagnosis not present

## 2021-07-06 DIAGNOSIS — R790 Abnormal level of blood mineral: Secondary | ICD-10-CM | POA: Diagnosis not present

## 2021-07-06 DIAGNOSIS — W19XXXA Unspecified fall, initial encounter: Secondary | ICD-10-CM

## 2021-07-06 DIAGNOSIS — Z79899 Other long term (current) drug therapy: Secondary | ICD-10-CM | POA: Diagnosis not present

## 2021-07-06 DIAGNOSIS — Z7982 Long term (current) use of aspirin: Secondary | ICD-10-CM | POA: Insufficient documentation

## 2021-07-06 DIAGNOSIS — I10 Essential (primary) hypertension: Secondary | ICD-10-CM | POA: Insufficient documentation

## 2021-07-06 DIAGNOSIS — R7989 Other specified abnormal findings of blood chemistry: Secondary | ICD-10-CM

## 2021-07-06 LAB — CBC WITH DIFFERENTIAL/PLATELET
Abs Immature Granulocytes: 0.02 10*3/uL (ref 0.00–0.07)
Basophils Absolute: 0.1 10*3/uL (ref 0.0–0.1)
Basophils Relative: 1 %
Eosinophils Absolute: 0.1 10*3/uL (ref 0.0–0.5)
Eosinophils Relative: 2 %
HCT: 37.8 % (ref 36.0–46.0)
Hemoglobin: 11.1 g/dL — ABNORMAL LOW (ref 12.0–15.0)
Immature Granulocytes: 0 %
Lymphocytes Relative: 10 %
Lymphs Abs: 0.7 10*3/uL (ref 0.7–4.0)
MCH: 25.6 pg — ABNORMAL LOW (ref 26.0–34.0)
MCHC: 29.4 g/dL — ABNORMAL LOW (ref 30.0–36.0)
MCV: 87.1 fL (ref 80.0–100.0)
Monocytes Absolute: 0.8 10*3/uL (ref 0.1–1.0)
Monocytes Relative: 11 %
Neutro Abs: 5.3 10*3/uL (ref 1.7–7.7)
Neutrophils Relative %: 76 %
Platelets: 285 10*3/uL (ref 150–400)
RBC: 4.34 MIL/uL (ref 3.87–5.11)
RDW: 17.1 % — ABNORMAL HIGH (ref 11.5–15.5)
WBC: 7 10*3/uL (ref 4.0–10.5)
nRBC: 0 % (ref 0.0–0.2)

## 2021-07-06 LAB — COMPREHENSIVE METABOLIC PANEL
ALT: 6 U/L (ref 0–44)
AST: 22 U/L (ref 15–41)
Albumin: 3.2 g/dL — ABNORMAL LOW (ref 3.5–5.0)
Alkaline Phosphatase: 94 U/L (ref 38–126)
Anion gap: 11 (ref 5–15)
BUN: 38 mg/dL — ABNORMAL HIGH (ref 8–23)
CO2: 25 mmol/L (ref 22–32)
Calcium: 8.9 mg/dL (ref 8.9–10.3)
Chloride: 103 mmol/L (ref 98–111)
Creatinine, Ser: 0.86 mg/dL (ref 0.44–1.00)
GFR, Estimated: >60 mL/min (ref >60)
Glucose, Bld: 153 mg/dL — ABNORMAL HIGH (ref 70–99)
Potassium: 4.5 mmol/L (ref 3.5–5.1)
Sodium: 139 mmol/L (ref 135–145)
Total Bilirubin: 0.9 mg/dL (ref 0.3–1.2)
Total Protein: 7 g/dL (ref 6.5–8.1)

## 2021-07-06 LAB — BRAIN NATRIURETIC PEPTIDE: B Natriuretic Peptide: 680.6 pg/mL — ABNORMAL HIGH (ref 0.0–100.0)

## 2021-07-06 LAB — PROTIME-INR
INR: 1.2 (ref 0.8–1.2)
Prothrombin Time: 15.2 seconds (ref 11.4–15.2)

## 2021-07-06 MED ORDER — ACETAMINOPHEN 325 MG PO TABS
650.0000 mg | ORAL_TABLET | Freq: Once | ORAL | Status: AC
  Administered 2021-07-06: 650 mg via ORAL
  Filled 2021-07-06: qty 2

## 2021-07-06 MED ORDER — HYDROCODONE-ACETAMINOPHEN 5-325 MG PO TABS
1.0000 | ORAL_TABLET | Freq: Once | ORAL | Status: AC
  Administered 2021-07-06: 1 via ORAL
  Filled 2021-07-06: qty 1

## 2021-07-06 NOTE — ED Triage Notes (Signed)
Pt BIB EMS from home c/o fall. Per report pt slip and fell with her walker face down. Pt sustain hematoma  under her right eye and per repot pt nose is bleeding upon arrival. Pt denies LOC. Pt denies taking blood thinners. Pt a/ox4.   BP 136 98 HR 70 RR 20 O2 98% on 2L CBG 162

## 2021-07-06 NOTE — ED Notes (Addendum)
Pt o2sat remain 95-100 on RA. Provider informed.

## 2021-07-06 NOTE — Discharge Instructions (Addendum)
The CT scans of your head and neck are without any fractures or abnormalities.   Your chest x-ray is notable for some evidence of CHF/heart failure.  This is why you are on Lasix and I would like you to continue your Lasix as prescribed however I would like you to follow-up with your cardiologist.  If you do not have a cardiologist please follow-up with the [facility] health cardiology group which I have given you the information for.  You may always return to the ER for any new or concerning symptoms.  Please continue taking your prescribed medications.  Please use Tylenol 1000 mg every 6 hours as needed for pain.  Please use cool compresses to your face.  The bruising will improve over time however it will likely take several days to a week.

## 2021-07-06 NOTE — ED Provider Notes (Signed)
[facility] DEPT Provider Note   CSN: [id number] Arrival date & time: 07/06/21  2041     History Chief Complaint  Patient presents with   Stephanie [name] is a 75 y.o. female.  HPI 75 year old female who presents with right facial injury after a fall today.  She is normally ambulatory with a walker.  She is not on blood thinners.  She fell inside her home while walking with her walker stating it rolled out in front of her. She fell face forward and hit her right eye and cheek on the bar of her walker and on the floor. She then called her daughter who called EMS. She complains of facial pain on her right cheek and around her right eye with an associated headache and epistaxis after the fall. She denies any other pain or injury from the fall. Patient also reports four days of intermittent lightheadedness that she thinks is from her medications. Her daughter reports a concern for fluid overload status stating she does not think her current oral lasix dose is adequately managing her fluid. Daughter reports patient has gained about 8 pounds of fluid over the past week, and has only lost 3. She is currently taking amoxicillin for bilateral lower extremity cellulitis. Patient denies dizziness, fever, chest pain, cough, abdominal pain, back pain, urinary incontinence, numbness, tingling.   Patient describes her pain as achy, 8/10 and located in her right cheek area and face.    Past Medical History:  Diagnosis Date   Anemia    Arthritis    Chronic back pain    Diabetes mellitus without complication (HCC)    Dysrhythmia    GERD (gastroesophageal reflux disease)    Hyperlipidemia    Hypertension    Hypothyroid    Multilevel degenerative disc disease    Parkinson's disease ([place])    Sleep apnea    Syncope    Tremors of nervous system    Wears dentures    full upper and lower    Patient Active Problem List   Diagnosis Date Noted   Hyponatremia  12/31/2015    Past Surgical History:  Procedure Laterality Date   ABDOMINAL HYSTERECTOMY     BACK SURGERY  09/16   CARDIAC CATHETERIZATION     CATARACT EXTRACTION W/PHACO Right 11/24/2015   Procedure: CATARACT EXTRACTION PHACO AND INTRAOCULAR LENS PLACEMENT ([place]);  Surgeon: Estill Cotta, MD;  Location: [facility] ORS;  Service: Ophthalmology;  Laterality: Right;  [place] 01:14 AP% 25.5 CDE 30.69 fluid pack lot # [id number]   CATARACT EXTRACTION W/PHACO Left 01/06/2021   Procedure: CATARACT EXTRACTION PHACO AND INTRAOCULAR LENS PLACEMENT (IOC) LEFT DIABETIC 6.98 00:40.1;  Surgeon: Birder Robson, MD;  Location: [place];  Service: Ophthalmology;  Laterality: Left;  Diabetic - oral meds   COLONOSCOPY     COLONOSCOPY WITH PROPOFOL N/A 07/24/2019   Procedure: COLONOSCOPY WITH PROPOFOL;  Surgeon: Toledo, Benay Pike, MD;  Location: [facility] END[facility];  Service: Gastroenterology;  Laterality: N/A;   ESOPHAGOGASTRODUODENOSCOPY N/A 07/24/2019   Procedure: ESOPHAGOGASTRODUODENOSCOPY (EGD);  Surgeon: Toledo, Benay Pike, MD;  Location: [facility] END[facility];  Service: Gastroenterology;  Laterality: N/A;   OOPHORECTOMY       OB History     Gravida  4   Para      Term      Preterm      AB      Living  4      SAB  IAB      Ectopic      Multiple      Live Births              Family History  Problem Relation Age of Onset   Breast cancer Paternal Aunt     Social History   Tobacco Use   Smoking status: Never   Smokeless tobacco: Never  Vaping Use   Vaping Use: Never used  Substance Use Topics   Alcohol use: Yes    Comment: rare   Drug use: No    Home Medications Prior to Admission medications   Medication Sig Start Date End Date Taking? Authorizing Provider  allopurinol (ZYLOPRIM) 100 MG tablet Take 100 mg by mouth daily.    [provider]  amantadine (SYMMETREL) 100 MG capsule Take 100 mg by mouth 2 (two) times daily.    [provider]   Aspirin-Acetaminophen-Caffeine (GOODY HEADACHE PO) Take by mouth.    [provider]  carbidopa-levodopa (SINEMET CR) 50-200 MG tablet Take 1 tablet by mouth at bedtime.    [provider]  carbidopa-levodopa (SINEMET IR) 25-100 MG tablet Take 2.5 tablets by mouth 4 (four) times daily.    [provider]  colchicine 0.6 MG tablet Take 1 tablet (0.6 mg total) by mouth daily. 09/30/17 10/30/17  Gregor Hams, MD  Continuous Blood Gluc Sensor (FREESTYLE LIBRE 14 DAY SENSOR) MISC by Does not apply route.    [provider]  Cyanocobalamin (VITAMIN B-12 PO) Take by mouth daily.    [provider]  docusate sodium (COLACE) 100 MG capsule Take 100 mg by mouth 2 (two) times daily.    [provider]  enalapril (VASOTEC) 20 MG tablet Take 20 mg by mouth 2 (two) times daily.     [provider]  ferrous sulfate 325 (65 FE) MG tablet Take 325 mg by mouth daily with breakfast.    [provider]  fluticasone (FLONASE) 50 MCG/ACT nasal spray Place 1-2 sprays into the nose as needed. 09/29/15   [provider]  furosemide (LASIX) 40 MG tablet Take 40 mg by mouth daily as needed.    [provider]  gabapentin (NEURONTIN) 300 MG capsule Take 300 mg by mouth at bedtime.    [provider]  glucose 4 GM chewable tablet Chew 1 tablet by mouth as needed for low blood sugar.    [provider]  levothyroxine (SYNTHROID, LEVOTHROID) 25 MCG tablet Take 25 mcg by mouth daily before breakfast.    [provider]  MAGNESIUM PO Take by mouth daily.    [provider]  meloxicam (MOBIC) 15 MG tablet Take 15 mg by mouth daily. Patient not taking: Reported on 12/30/2020    [provider]  metFORMIN (GLUCOPHAGE) 1000 MG tablet Take 1,000 mg by mouth 2 (two) times daily with a meal.    [provider]  metoprolol succinate (TOPROL-XL) 50 MG 24 hr tablet Take 50 mg by mouth daily.  Take with or immediately following a meal.    [provider]  Multiple Vitamin (MULTIVITAMIN WITH MINERALS) TABS tablet Take 1 tablet by mouth daily.    [provider]  oxyCODONE-acetaminophen (PERCOCET/ROXICET) 5-325 MG tablet Take 1 tablet by mouth every 6 (six) hours as needed for severe pain.    [provider]  OXYGEN Inhale into the lungs as needed.    [provider]  pantoprazole (PROTONIX) 40 MG tablet Take 1 tablet (40 mg  total) by mouth daily. Patient not taking: Reported on 12/30/2020 01/02/16   Bettey Costa, MD  pramipexole (MIRAPEX) 0.125 MG tablet Take 0.125 mg by mouth 4 (four) times daily as needed.    [provider]  pravastatin (PRAVACHOL) 40 MG tablet Take 40 mg by mouth daily.    [provider]  spironolactone (ALDACTONE) 50 MG tablet Take 50 mg by mouth 2 (two) times daily. Patient not taking: Reported on 12/30/2020    [provider]  glimepiride (AMARYL) 4 MG tablet Take 4 mg by mouth 2 (two) times daily. Pt. Takes 1 tablet in the morning and .5 tablet at night.  12/05/20  [provider]  pioglitazone (ACTOS) 30 MG tablet Take 30 mg by mouth daily.  12/05/20  [provider]    Allergies    Patient has no known allergies.  Review of Systems   Review of Systems  Constitutional:  Negative for chills and fever.  HENT:  Negative for congestion.   Eyes:  Negative for pain.  Respiratory:  Positive for shortness of breath. Negative for cough.   Cardiovascular:  Positive for leg swelling. Negative for chest pain.  Gastrointestinal:  Negative for abdominal pain, diarrhea, nausea and vomiting.  Genitourinary:  Negative for dysuria.  Musculoskeletal:  Negative for myalgias.  Skin:  Negative for rash.  Neurological:  Negative for dizziness and headaches.   Physical Exam Updated Vital Signs BP 128/73   Pulse 60   Temp 97.6 F (36.4 C)   Resp 18   SpO2 99%   Physical Exam Vitals and nursing  note reviewed.  Constitutional:      General: She is not in acute distress.    Comments: 75 year old female appears stated age speaking full sentences, no tachypnea.  Able answer questions but with a follow commands.  Nontoxic-appearing  HENT:     Head: Normocephalic.     Comments: Patient has significant swelling to the lower eyelid on the right eye.  I am able to pry open eyelids to see that she has no subconjunctival hemorrhage.  Also seems to have grossly intact vision and pupillary reflexes are intact.  Extraocular movements are intact.    Nose: Nose normal.  Eyes:     General: No scleral icterus. Cardiovascular:     Rate and Rhythm: Normal rate and regular rhythm.     Pulses: Normal pulses.     Heart sounds: Normal heart sounds.  Pulmonary:     Effort: Pulmonary effort is normal. No respiratory distress.     Breath sounds: No wheezing.  Abdominal:     Palpations: Abdomen is soft.     Tenderness: There is no abdominal tenderness. There is no guarding or rebound.  Musculoskeletal:     Cervical back: Normal range of motion.     Right lower leg: No edema.     Left lower leg: No edema.     Comments: No bony tenderness over joints or long bones of the upper and lower extremities.    No neck or back midline tenderness, step-off, deformity, or bruising. Able to turn head left and right 45 degrees without difficulty.  Full range of motion of upper and lower extremity joints shown after palpation was conducted; with 5/5 symmetrical strength in upper and lower extremities. No chest wall tenderness, there is some facial tenderness over right maxilla and periocular area  Patient has intact sensation grossly in lower and upper extremities. Intact patellar and ankle reflexes. Patient able to ambulate without  difficulty.  Radial and DP pulses palpated BL.    Skin:    General: Skin is warm and dry.     Capillary Refill: Capillary refill takes less than 2 seconds.     Comments: Bilateral legs  with venus statis dermatitis  Neurological:     Mental Status: She is alert. Mental status is at baseline.  Psychiatric:        Mood and Affect: Mood normal.        Behavior: Behavior normal.    ED Results / Procedures / Treatments   Labs (all labs ordered are listed, but only abnormal results are displayed) Labs Reviewed  PROTIME-INR  CBC WITH DIFFERENTIAL/PLATELET  BASIC METABOLIC PANEL  BRAIN NATRIURETIC PEPTIDE    EKG None  Radiology DG Chest 2 View  Result Date: 07/06/2021 CLINICAL DATA:  Shortness of breath EXAM: CHEST - 2 VIEW COMPARISON:  12/05/2020 FINDINGS: Cardiomegaly. Right mid lung and lingular atelectasis. Mild vascular congestion. No overt edema. No effusions or acute bony abnormality. IMPRESSION: Cardiomegaly, vascular congestion. Areas of atelectasis bilaterally. Electronically Signed   By: Rolm Baptise M.D.   On: 07/06/2021 22:03    Procedures Procedures   Medications Ordered in ED Medications - No data to display  ED Course  I have reviewed the triage vital signs and the nursing notes.  Pertinent labs & imaging results that were available during my care of the patient were reviewed by me and considered in my medical decision making (see chart for details).  Patient 75 year old female here today for evaluation after mechanical fall.  She states that she slipped forward and smashed her face against the walker that she uses as well as and around her face.  She did not experience syncope.  She denies any significant headache.  States that she has facial pain mostly on the right cheek she has a swollen right-sided black eye.  Physical exam is overall reassuring lungs are clear and lower extremities have some evidence of venous stasis dermatitis.  She is on antibiotics for prophylaxis/treatment of cellulitis.  She also has some concerns about some shortness of breath seems to be a chronic issue she is on Lasix for heart failure. I am not able to view any records  of a echocardiogram.  She states that she is not having orthopnea or PND however does feel that she is more short of breath in general and feels that the Lasix she is on is not treating her completely. She has had 4 to 5 pounds of weight gain over the past week or 2.  BNP elevated at 680.  Chest x-ray without any pulmonary edema however is consistent with CHF.  CMP unremarkable albumin mildly low.  CBC mild anemia but significant changes.  CT imaging overall relatively unremarkable. EKG nonischemic.  Clinical Course as of 07/07/21 0904  Mon Jul 06, 2021  2249 CT head, C-spine, max face without any fractures, oral fractures, intracranial hemorrhage.  Chest x-ray with some pulmonary vascular congestion.  No overt pulmonary edema or fluid overload. [WF]    Clinical Course User Index [WF] Tedd Sias, [place]   MDM Rules/Calculators/A&P                          I discussed this case with my attending physician who cosigned this note including patient's presenting symptoms, physical exam, and planned diagnostics and interventions. Attending physician stated agreement with plan or made changes to plan which were implemented.  Attending physician assessed patient at bedside.   We will discharge patient home she has had 1 dose of analgesia here and feels much improved.  She is agreeable to plan.  Ambulatory at her baseline which is with a walker.  Pain relatively well controlled.  She will continue Tylenol at home.  Follow-up with PCP as well as cardiology I believe she would benefit from continued care for her CHF, monitoring and perhaps a repeat echo depending on when she had her most recent echo.   Final Clinical Impression(s) / ED Diagnoses Final diagnoses:  Fall, initial encounter  Facial injury, initial encounter  Strain of neck muscle, initial encounter    Rx / DC Orders ED Discharge Orders     None        Tedd Sias, [place] 07/07/21 [id number]    Truddie Hidden,  MD 07/07/21 [phone]

## 2021-07-20 ENCOUNTER — Encounter: Payer: Self-pay | Admitting: *Deleted

## 2021-07-20 ENCOUNTER — Emergency Department: Payer: Medicare Other

## 2021-07-20 ENCOUNTER — Other Ambulatory Visit: Payer: Self-pay

## 2021-07-20 ENCOUNTER — Inpatient Hospital Stay
Admission: EM | Admit: 2021-07-20 | Discharge: 2021-07-28 | DRG: 291 | Disposition: A | Discharge status: Home Health | Payer: Medicare Other | Attending: Internal Medicine | Admitting: Internal Medicine

## 2021-07-20 DIAGNOSIS — I110 Hypertensive heart disease with heart failure: Principal | ICD-10-CM | POA: Diagnosis present

## 2021-07-20 DIAGNOSIS — E785 Hyperlipidemia, unspecified: Secondary | ICD-10-CM | POA: Diagnosis present

## 2021-07-20 DIAGNOSIS — J9601 Acute respiratory failure with hypoxia: Secondary | ICD-10-CM | POA: Diagnosis present

## 2021-07-20 DIAGNOSIS — E871 Hypo-osmolality and hyponatremia: Secondary | ICD-10-CM | POA: Diagnosis present

## 2021-07-20 DIAGNOSIS — Z79899 Other long term (current) drug therapy: Secondary | ICD-10-CM

## 2021-07-20 DIAGNOSIS — K5900 Constipation, unspecified: Secondary | ICD-10-CM

## 2021-07-20 DIAGNOSIS — K219 Gastro-esophageal reflux disease without esophagitis: Secondary | ICD-10-CM | POA: Diagnosis present

## 2021-07-20 DIAGNOSIS — M109 Gout, unspecified: Secondary | ICD-10-CM | POA: Diagnosis present

## 2021-07-20 DIAGNOSIS — G4733 Obstructive sleep apnea (adult) (pediatric): Secondary | ICD-10-CM | POA: Diagnosis present

## 2021-07-20 DIAGNOSIS — Z20822 Contact with and (suspected) exposure to covid-19: Secondary | ICD-10-CM | POA: Diagnosis present

## 2021-07-20 DIAGNOSIS — E119 Type 2 diabetes mellitus without complications: Secondary | ICD-10-CM | POA: Diagnosis not present

## 2021-07-20 DIAGNOSIS — E039 Hypothyroidism, unspecified: Secondary | ICD-10-CM | POA: Diagnosis present

## 2021-07-20 DIAGNOSIS — I447 Left bundle-branch block, unspecified: Secondary | ICD-10-CM | POA: Diagnosis present

## 2021-07-20 DIAGNOSIS — R7989 Other specified abnormal findings of blood chemistry: Secondary | ICD-10-CM

## 2021-07-20 DIAGNOSIS — Z9981 Dependence on supplemental oxygen: Secondary | ICD-10-CM

## 2021-07-20 DIAGNOSIS — Z90710 Acquired absence of both cervix and uterus: Secondary | ICD-10-CM

## 2021-07-20 DIAGNOSIS — E1165 Type 2 diabetes mellitus with hyperglycemia: Secondary | ICD-10-CM | POA: Diagnosis present

## 2021-07-20 DIAGNOSIS — R0602 Shortness of breath: Secondary | ICD-10-CM | POA: Diagnosis present

## 2021-07-20 DIAGNOSIS — E1142 Type 2 diabetes mellitus with diabetic polyneuropathy: Secondary | ICD-10-CM

## 2021-07-20 DIAGNOSIS — Z7984 Long term (current) use of oral hypoglycemic drugs: Secondary | ICD-10-CM | POA: Diagnosis not present

## 2021-07-20 DIAGNOSIS — G20A1 Parkinson's disease without dyskinesia, without mention of fluctuations: Secondary | ICD-10-CM | POA: Diagnosis present

## 2021-07-20 DIAGNOSIS — I509 Heart failure, unspecified: Secondary | ICD-10-CM

## 2021-07-20 DIAGNOSIS — E873 Alkalosis: Secondary | ICD-10-CM | POA: Diagnosis present

## 2021-07-20 DIAGNOSIS — G20 Parkinson's disease: Secondary | ICD-10-CM | POA: Diagnosis present

## 2021-07-20 DIAGNOSIS — I272 Pulmonary hypertension, unspecified: Secondary | ICD-10-CM | POA: Diagnosis present

## 2021-07-20 DIAGNOSIS — D509 Iron deficiency anemia, unspecified: Secondary | ICD-10-CM | POA: Diagnosis present

## 2021-07-20 DIAGNOSIS — M795 Residual foreign body in soft tissue: Secondary | ICD-10-CM

## 2021-07-20 DIAGNOSIS — I5023 Acute on chronic systolic (congestive) heart failure: Secondary | ICD-10-CM | POA: Diagnosis present

## 2021-07-20 DIAGNOSIS — E1169 Type 2 diabetes mellitus with other specified complication: Secondary | ICD-10-CM | POA: Diagnosis present

## 2021-07-20 DIAGNOSIS — Z79890 Hormone replacement therapy (postmenopausal): Secondary | ICD-10-CM

## 2021-07-20 DIAGNOSIS — M79641 Pain in right hand: Secondary | ICD-10-CM

## 2021-07-20 LAB — CBC WITH DIFFERENTIAL/PLATELET
Abs Immature Granulocytes: 0.02 10*3/uL (ref 0.00–0.07)
Basophils Absolute: 0.1 10*3/uL (ref 0.0–0.1)
Basophils Relative: 1 %
Eosinophils Absolute: 0.1 10*3/uL (ref 0.0–0.5)
Eosinophils Relative: 1 %
HCT: 38.9 % (ref 36.0–46.0)
Hemoglobin: 12 g/dL (ref 12.0–15.0)
Immature Granulocytes: 0 %
Lymphocytes Relative: 20 %
Lymphs Abs: 1.3 10*3/uL (ref 0.7–4.0)
MCH: 25.7 pg — ABNORMAL LOW (ref 26.0–34.0)
MCHC: 30.8 g/dL (ref 30.0–36.0)
MCV: 83.3 fL (ref 80.0–100.0)
Monocytes Absolute: 0.5 10*3/uL (ref 0.1–1.0)
Monocytes Relative: 7 %
Neutro Abs: 4.6 10*3/uL (ref 1.7–7.7)
Neutrophils Relative %: 71 %
Platelets: 270 10*3/uL (ref 150–400)
RBC: 4.67 MIL/uL (ref 3.87–5.11)
RDW: 17.5 % — ABNORMAL HIGH (ref 11.5–15.5)
WBC: 6.6 10*3/uL (ref 4.0–10.5)
nRBC: 0 % (ref 0.0–0.2)

## 2021-07-20 LAB — COMPREHENSIVE METABOLIC PANEL
ALT: 7 U/L (ref 0–44)
AST: 22 U/L (ref 15–41)
Albumin: 3.2 g/dL — ABNORMAL LOW (ref 3.5–5.0)
Alkaline Phosphatase: 103 U/L (ref 38–126)
Anion gap: 7 (ref 5–15)
BUN: 25 mg/dL — ABNORMAL HIGH (ref 8–23)
CO2: 31 mmol/L (ref 22–32)
Calcium: 8.4 mg/dL — ABNORMAL LOW (ref 8.9–10.3)
Chloride: 100 mmol/L (ref 98–111)
Creatinine, Ser: 0.82 mg/dL (ref 0.44–1.00)
GFR, Estimated: >60 mL/min (ref >60)
Glucose, Bld: 212 mg/dL — ABNORMAL HIGH (ref 70–99)
Potassium: 3.6 mmol/L (ref 3.5–5.1)
Sodium: 138 mmol/L (ref 135–145)
Total Bilirubin: 1.3 mg/dL — ABNORMAL HIGH (ref 0.3–1.2)
Total Protein: 7.1 g/dL (ref 6.5–8.1)

## 2021-07-20 LAB — TROPONIN I (HIGH SENSITIVITY): Troponin I (High Sensitivity): 15 ng/L (ref <18)

## 2021-07-20 LAB — PROCALCITONIN: Procalcitonin: <0.1 ng/mL

## 2021-07-20 LAB — RESP PANEL BY RT-PCR (FLU A&B, COVID) ARPGX2
Influenza A by PCR: NEGATIVE
Influenza B by PCR: NEGATIVE
SARS Coronavirus 2 by RT PCR: NEGATIVE

## 2021-07-20 LAB — MAGNESIUM: Magnesium: 1.5 mg/dL — ABNORMAL LOW (ref 1.7–2.4)

## 2021-07-20 LAB — BRAIN NATRIURETIC PEPTIDE: B Natriuretic Peptide: 980.6 pg/mL — ABNORMAL HIGH (ref 0.0–100.0)

## 2021-07-20 MED ORDER — ENOXAPARIN SODIUM 40 MG/0.4ML IJ SOSY
40.0000 mg | PREFILLED_SYRINGE | Freq: Every day | INTRAMUSCULAR | Status: DC
  Administered 2021-07-21 – 2021-07-27 (×8): 40 mg via SUBCUTANEOUS
  Filled 2021-07-20 (×8): qty 0.4

## 2021-07-20 MED ORDER — GABAPENTIN 300 MG PO CAPS
300.0000 mg | ORAL_CAPSULE | Freq: Every day | ORAL | Status: DC
  Administered 2021-07-21 – 2021-07-27 (×8): 300 mg via ORAL
  Filled 2021-07-20 (×8): qty 1

## 2021-07-20 MED ORDER — ALLOPURINOL 100 MG PO TABS
100.0000 mg | ORAL_TABLET | Freq: Every day | ORAL | Status: DC
  Administered 2021-07-21 – 2021-07-28 (×8): 100 mg via ORAL
  Filled 2021-07-20 (×8): qty 1

## 2021-07-20 MED ORDER — FUROSEMIDE 10 MG/ML IJ SOLN: 40.0000 mg | Freq: Two times a day (BID) | INTRAMUSCULAR | Status: DC

## 2021-07-20 MED ORDER — POTASSIUM CHLORIDE CRYS ER 20 MEQ PO TBCR
20.0000 meq | EXTENDED_RELEASE_TABLET | Freq: Once | ORAL | Status: AC
  Administered 2021-07-21: 20 meq via ORAL
  Filled 2021-07-20: qty 1

## 2021-07-20 MED ORDER — CARBIDOPA-LEVODOPA 25-100 MG PO TABS
2.5000 | ORAL_TABLET | Freq: Four times a day (QID) | ORAL | Status: DC
  Administered 2021-07-21 – 2021-07-28 (×31): 2.5 via ORAL
  Filled 2021-07-20 (×36): qty 2.5

## 2021-07-20 MED ORDER — OXYCODONE-ACETAMINOPHEN 5-325 MG PO TABS
1.0000 | ORAL_TABLET | Freq: Four times a day (QID) | ORAL | Status: DC | PRN
  Administered 2021-07-21 – 2021-07-28 (×17): 1 via ORAL
  Filled 2021-07-20 (×17): qty 1

## 2021-07-20 MED ORDER — ENALAPRIL MALEATE 10 MG PO TABS
20.0000 mg | ORAL_TABLET | Freq: Two times a day (BID) | ORAL | Status: DC
  Administered 2021-07-21: 20 mg via ORAL
  Filled 2021-07-20: qty 2

## 2021-07-20 MED ORDER — FUROSEMIDE 10 MG/ML IJ SOLN
60.0000 mg | Freq: Once | INTRAMUSCULAR | Status: AC
  Administered 2021-07-21: 60 mg via INTRAVENOUS
  Filled 2021-07-20: qty 8

## 2021-07-20 MED ORDER — AMANTADINE HCL 100 MG PO CAPS
100.0000 mg | ORAL_CAPSULE | Freq: Two times a day (BID) | ORAL | Status: DC
  Administered 2021-07-21 – 2021-07-28 (×15): 100 mg via ORAL
  Filled 2021-07-20 (×17): qty 1

## 2021-07-20 MED ORDER — ACETAMINOPHEN 650 MG RE SUPP: 650.0000 mg | Freq: Four times a day (QID) | RECTAL | Status: DC | PRN

## 2021-07-20 MED ORDER — LEVOTHYROXINE SODIUM 25 MCG PO TABS
25.0000 ug | ORAL_TABLET | Freq: Every day | ORAL | Status: DC
  Administered 2021-07-21: 25 ug via ORAL
  Filled 2021-07-20: qty 1

## 2021-07-20 MED ORDER — METOPROLOL SUCCINATE ER 50 MG PO TB24
50.0000 mg | ORAL_TABLET | Freq: Every day | ORAL | Status: DC
  Administered 2021-07-21 – 2021-07-28 (×8): 50 mg via ORAL
  Filled 2021-07-20 (×8): qty 1

## 2021-07-20 MED ORDER — FERROUS SULFATE 325 (65 FE) MG PO TABS
325.0000 mg | ORAL_TABLET | Freq: Every day | ORAL | Status: DC
  Administered 2021-07-21 – 2021-07-28 (×8): 325 mg via ORAL
  Filled 2021-07-20 (×8): qty 1

## 2021-07-20 MED ORDER — DOCUSATE SODIUM 100 MG PO CAPS
100.0000 mg | ORAL_CAPSULE | Freq: Two times a day (BID) | ORAL | Status: DC
  Administered 2021-07-21 – 2021-07-28 (×15): 100 mg via ORAL
  Filled 2021-07-20 (×15): qty 1

## 2021-07-20 MED ORDER — INSULIN ASPART 100 UNIT/ML IJ SOLN
0.0000 [IU] | Freq: Three times a day (TID) | INTRAMUSCULAR | Status: DC
  Administered 2021-07-21: 2 [IU] via SUBCUTANEOUS
  Administered 2021-07-21: 3 [IU] via SUBCUTANEOUS
  Administered 2021-07-21 – 2021-07-22 (×2): 1 [IU] via SUBCUTANEOUS
  Administered 2021-07-22: 2 [IU] via SUBCUTANEOUS
  Administered 2021-07-22: 5 [IU] via SUBCUTANEOUS
  Administered 2021-07-23: 1 [IU] via SUBCUTANEOUS
  Administered 2021-07-23: 2 [IU] via SUBCUTANEOUS
  Administered 2021-07-23: 1 [IU] via SUBCUTANEOUS
  Administered 2021-07-24: 2 [IU] via SUBCUTANEOUS
  Administered 2021-07-24: 3 [IU] via SUBCUTANEOUS
  Administered 2021-07-25: 5 [IU] via SUBCUTANEOUS
  Administered 2021-07-25: 1 [IU] via SUBCUTANEOUS
  Administered 2021-07-25: 5 [IU] via SUBCUTANEOUS
  Administered 2021-07-26: 2 [IU] via SUBCUTANEOUS
  Administered 2021-07-26: 3 [IU] via SUBCUTANEOUS
  Administered 2021-07-26: 1 [IU] via SUBCUTANEOUS
  Administered 2021-07-27 (×2): 3 [IU] via SUBCUTANEOUS
  Administered 2021-07-27: 7 [IU] via SUBCUTANEOUS
  Administered 2021-07-28 (×2): 1 [IU] via SUBCUTANEOUS
  Administered 2021-07-28: 7 [IU] via SUBCUTANEOUS
  Filled 2021-07-20 (×23): qty 1

## 2021-07-20 MED ORDER — PRAVASTATIN SODIUM 40 MG PO TABS
40.0000 mg | ORAL_TABLET | Freq: Every day | ORAL | Status: DC
  Administered 2021-07-21 – 2021-07-28 (×8): 40 mg via ORAL
  Filled 2021-07-20: qty 2
  Filled 2021-07-20: qty 1
  Filled 2021-07-20: qty 2
  Filled 2021-07-20 (×5): qty 1

## 2021-07-20 MED ORDER — ACETAMINOPHEN 325 MG PO TABS: 650.0000 mg | ORAL_TABLET | Freq: Four times a day (QID) | ORAL | Status: DC | PRN

## 2021-07-20 NOTE — H&P (Signed)
History and Physical    PLEASE NOTE THAT DRAGON DICTATION SOFTWARE WAS USED IN THE CONSTRUCTION OF THIS NOTE.   Stephanie [name] [id number] DOB: April 15, 1946 DOA: 07/20/2021  PCP: [name], MD Patient coming from: home   I have personally briefly reviewed patient's old medical records in [place]  Chief Complaint: Shortness of breath  HPI: Stephanie [name] is a 75 y.o. female with medical history significant for chronic systolic heart failure with most recent echocardiogram in June 2020 showing LV EF 40 to 45%, type 2 diabetes mellitus, chronic iron deficiency anemia with baseline hemoglobin 11-12, hypertension, hyperlipidemia, Parkinson's disease, who is admitted to [facility] on 07/20/2021 with acute on chronic systolic heart failure after presenting from home to [facility] ED complaining of shortness of breath.   Patient reports that 5 days of progressive shortness of breath associated with orthopnea and worsening of edema in the bilateral lower extremities.  She believes that this is also been associated with interval increase of BP, as unable to quantify this.  Denies any associated calf tenderness, new lower extremity erythema.  No significant associated increase in cough, denies any hemoptysis.  Not associate any chest pain, diaphoresis, palpitations, nausea, vomiting, presyncope, or syncope.  No wheezing.  Denies any recent trauma, travel, periods of prolonged diminished ambulatory status.  No recent abdominal pain, melena or hematochezia.   Denies any associated subjective fever, chills, rigors, or generalized myalgias. No recent headache, neck stiffness, rhinitis, rhinorrhea, sore throat, diarrhea, or rash. No known recent COVID-19 exposures. Denies dysuria, gross hematuria, or change in urinary urgency/frequency.   The patient has a documented history of chronic systolic heart failure, with peritoneal, including review of information available in  care everywhere reflecting most recent echocardiogram in June 2020, which was associated with LVEF 40 to 45%.  She reports good compliance with her baseline diuretic regimen, which consists of Lasix 40 mg p.o. daily, noting no recent missed doses.  After 1 to 2 days of progressive shortness of breath, she reportedly contacted the office of her outpatient cardiologist, Dr. Nehemiah Massed or [facility] cardiology, with reported ensuing recommendation to increase her Lasix from 40 mg p.o. daily to total daily dose of 100 mg.  She correspondingly increased her Lasix to 100 mg q daily, with first dose on 07/18/2021.  She has subsequently been taking Lasix at increased dose, and No significant improvement in her shortness of breath.  She followed up as an outpatient earlier today with [facility] In cardiology clinic, recommended that she present to [facility] ED consideration for admission for acute on chronic systolic heart failure recommended need for IV diuresis.  In the setting of her chronic systolic heart failure, the patient reports that she has been prescribed supplemental oxygen previously, but only on a as needed basis, with associated recommendation for close monitoring of her pulse ox as an outpatient.  Has been closely monitoring her pulse ox at home over the last several months, and notes that she has not required any supplemental oxygen over that time.  She conveys that she is a lifelong non-smoker, and denies any history of underlying COPD or asthma.      ED Course:  Vital signs in the ED were notable for the following: Afebrile; heart rate 82; blood pressure 140/74; respiratory 22, oxygen saturation initially 88% on room air, with ensuing improvement to 96% on 2 L nasal cannula.  Labs were notable for the following: CMP notable for the following: Sodium 130 compared to  139 on 07/06/2021, potassium 3.2, bicarbonate 31, creatinine 0.82 relative 0.86 on 07/06/2021.  BNP 980 compared to most recent prior value of  680 on 07/06/2021.  High-sensitivity troponin I x1 noted to be 15.  CBC notable for the following: Blood cell count 6600, hemoglobin 12 compared to 11.1 on 522.  Screening COVID-19/influenza PCR were checked in the ED this evening, with result currently pending.  Imaging and additional notable ED work-up: EKG in comparison to most recent prior from 07/07/2021 shows sinus rhythm with multiple PACs, heart rate 84, normal intervals, no evidence of T wave or ST changes including no evidence of ST elevation.  Additionally, today's EKG shows potential Q waves in leads II, aVF, and V3, which potential Q waves in lead II and V3 or also noted to be present on most recent prior EKG.  Chest x-ray shows stable cardiomegaly with mild stable bibasilar linear scarring versus atelectasis, without evidence of infiltrate, effusion, or pneumothorax.  While in the ED, the following were administered: Lasix 60 mg IV x1 dose.  Subsequently, the patient is being admittedto telemetry for further evaluation and management of acute on chronic systolic heart failure.      Review of Systems: As per HPI otherwise 10 point review of systems negative.   Past Medical History:  Diagnosis Date   Anemia    Arthritis    Chronic back pain    Diabetes mellitus without complication (HCC)    Dysrhythmia    GERD (gastroesophageal reflux disease)    Hyperlipidemia    Hypertension    Hypothyroid    Multilevel degenerative disc disease    Parkinson's disease ([place])    Sleep apnea    Syncope    Tremors of nervous system    Wears dentures    full upper and lower    Past Surgical History:  Procedure Laterality Date   ABDOMINAL HYSTERECTOMY     BACK SURGERY  09/16   CARDIAC CATHETERIZATION     CATARACT EXTRACTION W/PHACO Right 11/24/2015   Procedure: CATARACT EXTRACTION PHACO AND INTRAOCULAR LENS PLACEMENT ([place]);  Surgeon: Estill Cotta, MD;  Location: [facility] ORS;  Service: Ophthalmology;  Laterality: Right;  [place] 01:14 AP%  25.5 CDE 30.69 fluid pack lot # [id number]   CATARACT EXTRACTION W/PHACO Left 01/06/2021   Procedure: CATARACT EXTRACTION PHACO AND INTRAOCULAR LENS PLACEMENT (IOC) LEFT DIABETIC 6.98 00:40.1;  Surgeon: Birder Robson, MD;  Location: [place];  Service: Ophthalmology;  Laterality: Left;  Diabetic - oral meds   COLONOSCOPY     COLONOSCOPY WITH PROPOFOL N/A 07/24/2019   Procedure: COLONOSCOPY WITH PROPOFOL;  Surgeon: Toledo, Benay Pike, MD;  Location: [facility] ENDOSCOPY;  Service: Gastroenterology;  Laterality: N/A;   ESOPHAGOGASTRODUODENOSCOPY N/A 07/24/2019   Procedure: ESOPHAGOGASTRODUODENOSCOPY (EGD);  Surgeon: Toledo, Benay Pike, MD;  Location: [facility] ENDOSCOPY;  Service: Gastroenterology;  Laterality: N/A;   OOPHORECTOMY      Social History:  reports that she has never smoked. She has never used smokeless tobacco. She reports that she does not currently use alcohol. She reports that she does not use drugs.   No Known Allergies  Family History  Problem Relation Age of Onset   Breast cancer Paternal Aunt     Family history reviewed and not pertinent    Prior to Admission medications   Medication Sig Start Date End Date Taking? Authorizing Provider  allopurinol (ZYLOPRIM) 100 MG tablet Take 100 mg by mouth daily.    [provider]  amantadine (SYMMETREL) 100 MG capsule Take  100 mg by mouth 2 (two) times daily.    [provider]  Aspirin-Acetaminophen-Caffeine (GOODY HEADACHE PO) Take by mouth.    [provider]  carbidopa-levodopa (SINEMET CR) 50-200 MG tablet Take 1 tablet by mouth at bedtime.    [provider]  carbidopa-levodopa (SINEMET IR) 25-100 MG tablet Take 2.5 tablets by mouth 4 (four) times daily.    [provider]  colchicine 0.6 MG tablet Take 1 tablet (0.6 mg total) by mouth daily. 09/30/17 10/30/17  Gregor Hams, MD  Continuous Blood Gluc Sensor (FREESTYLE LIBRE 14 DAY SENSOR) MISC by Does not apply route.     [provider]  Cyanocobalamin (VITAMIN B-12 PO) Take by mouth daily.    [provider]  docusate sodium (COLACE) 100 MG capsule Take 100 mg by mouth 2 (two) times daily.    [provider]  enalapril (VASOTEC) 20 MG tablet Take 20 mg by mouth 2 (two) times daily.     [provider]  ferrous sulfate 325 (65 FE) MG tablet Take 325 mg by mouth daily with breakfast.    [provider]  fluticasone (FLONASE) 50 MCG/ACT nasal spray Place 1-2 sprays into the nose as needed. 09/29/15   [provider]  furosemide (LASIX) 40 MG tablet Take 40 mg by mouth daily as needed.    [provider]  gabapentin (NEURONTIN) 300 MG capsule Take 300 mg by mouth at bedtime.    [provider]  glucose 4 GM chewable tablet Chew 1 tablet by mouth as needed for low blood sugar.    [provider]  levothyroxine (SYNTHROID, LEVOTHROID) 25 MCG tablet Take 25 mcg by mouth daily before breakfast.    [provider]  MAGNESIUM PO Take by mouth daily.    [provider]  meloxicam (MOBIC) 15 MG tablet Take 15 mg by mouth daily. Patient not taking: Reported on 12/30/2020    [provider]  metFORMIN (GLUCOPHAGE) 1000 MG tablet Take 1,000 mg by mouth 2 (two) times daily with a meal.    [provider]  metoprolol succinate (TOPROL-XL) 50 MG 24 hr tablet Take 50 mg by mouth daily. Take with or immediately following a meal.    [provider]  Multiple Vitamin (MULTIVITAMIN WITH MINERALS) TABS tablet Take 1 tablet by mouth daily.    [provider]  oxyCODONE-acetaminophen (PERCOCET/ROXICET) 5-325 MG tablet Take 1 tablet by mouth every 6 (six) hours as needed for severe pain.    [provider]  OXYGEN Inhale into the lungs as needed.    [provider]  pantoprazole (PROTONIX) 40 MG tablet Take 1 tablet (40 mg total) by mouth daily. Patient not taking: Reported on 12/30/2020  01/02/16   Bettey Costa, MD  pramipexole (MIRAPEX) 0.125 MG tablet Take 0.125 mg by mouth 4 (four) times daily as needed.    [provider]  pravastatin (PRAVACHOL) 40 MG tablet Take 40 mg by mouth daily.    [provider]  spironolactone (ALDACTONE) 50 MG tablet Take 50 mg by mouth 2 (two) times daily. Patient not taking: Reported on 12/30/2020    [provider]  glimepiride (AMARYL) 4 MG tablet Take 4 mg by mouth 2 (two) times daily. Pt. Takes 1 tablet in the morning and .5 tablet at night.  12/05/20  [provider]  pioglitazone (ACTOS) 30 MG tablet Take 30 mg by mouth daily.  12/05/20  [provider]     Objective  Physical Exam: Vitals:   07/20/21 1759 07/20/21 1800  BP: 140/74   Pulse: 82   Resp: (!) 22   Temp: 98.3 F (36.8 C)   TempSrc: Oral   SpO2: 96%   Weight:  88.5 kg  Height:  '5\' 1"'$  (1.549 m)    General: appears to be stated age; alert, oriented; mildly increased work of breathing noted Skin: warm, dry, no rash Head:  AT/[place] Mouth:  Oral mucosa membranes appear moist, normal dentition Neck: supple; trachea midline Heart:  RRR; did not appreciate any M/R/G Lungs: CTAB, did not appreciate any wheezes, rales, or rhonchi Abdomen: + BS; soft, ND, NT Vascular: 2+ pedal pulses b/l; 2+ radial pulses b/l Extremities: 2+ edema of the bilateral lower extremities with associated serous fluid weeping bilateral; no muscle wasting Neuro: strength and sensation intact in upper and lower extremities b/l    Labs on Admission: I have personally reviewed following labs and imaging studies  CBC: Recent Labs  Lab 07/20/21 1824  WBC 6.6  NEUTROABS 4.6  HGB 12.0  HCT 38.9  MCV 83.3  PLT [id number]   Basic Metabolic Panel: Recent Labs  Lab 07/20/21 1824  NA 138  K 3.6  CL 100  CO2 31  GLUCOSE 212*  BUN 25*  CREATININE 0.82  CALCIUM 8.4*   GFR: Estimated Creatinine Clearance: 60.9 mL/min (by C-G formula based on SCr of 0.82  mg/dL). Liver Function Tests: Recent Labs  Lab 07/20/21 1824  AST 22  ALT 7  ALKPHOS 103  BILITOT 1.3*  PROT 7.1  ALBUMIN 3.2*   No results for input(s): LIPASE, AMYLASE in the last 168 hours. No results for input(s): AMMONIA in the last 168 hours. Coagulation Profile: No results for input(s): INR, PROTIME in the last 168 hours. Cardiac Enzymes: No results for input(s): CKTOTAL, CKMB, CKMBINDEX, TROPONINI in the last 168 hours. BNP (last 3 results) No results for input(s): PROBNP in the last 8760 hours. HbA1C: No results for input(s): HGBA1C in the last 72 hours. CBG: No results for input(s): GLUCAP in the last 168 hours. Lipid Profile: No results for input(s): CHOL, HDL, LDLCALC, TRIG, CHOLHDL, LDLDIRECT in the last 72 hours. Thyroid Function Tests: No results for input(s): TSH, T4TOTAL, FREET4, T3FREE, THYROIDAB in the last 72 hours. Anemia Panel: No results for input(s): VITAMINB12, FOLATE, FERRITIN, TIBC, IRON, RETICCTPCT in the last 72 hours. Urine analysis:    Component Value Date/Time   COLORURINE YELLOW 12/05/2020 1828   APPEARANCEUR CLEAR 12/05/2020 1828   LABSPEC 1.010 12/05/2020 1828   PHURINE 7.5 12/05/2020 1828   GLUCOSEU NEGATIVE 12/05/2020 1828   HGBUR NEGATIVE 12/05/2020 1828   BILIRUBINUR NEGATIVE 12/05/2020 1828   KETONESUR NEGATIVE 12/05/2020 1828   PROTEINUR NEGATIVE 12/05/2020 1828   NITRITE NEGATIVE 12/05/2020 1828   LEUKOCYTESUR NEGATIVE 12/05/2020 1828    Radiological Exams on Admission: DG Chest 2 View  Result Date: 07/20/2021 CLINICAL DATA:  Shortness of breath. EXAM: CHEST - 2 VIEW COMPARISON:  July 06, 2021 FINDINGS: Mild, chronic appearing diffusely increased interstitial lung markings are seen. Mild, stable areas of linear scarring and/or atelectasis are noted within the bilateral lung bases. There is no evidence of a pleural effusion or pneumothorax. The cardiac silhouette is mildly enlarged and unchanged in size. Degenerative  changes seen throughout the thoracic spine. IMPRESSION: 1. Stable cardiomegaly with mild, stable bibasilar linear scarring and/or atelectasis. Electronically Signed   By: Virgina Norfolk M.D.   On: 07/20/2021 19:08     EKG: Independently reviewed, with  result as described above.    Assessment/Plan   Stephanie [name] is a 75 y.o. female with medical history significant for chronic systolic heart failure with most recent echocardiogram in June 2020 showing LV EF 40 to 45%, type 2 diabetes mellitus, chronic iron deficiency anemia with baseline hemoglobin 11-12, hypertension, hyperlipidemia, Parkinson's disease, who is admitted to [facility] on 07/20/2021 with acute on chronic systolic heart failure after presenting from home to [facility] ED complaining of shortness of breath.    Principal Problem:   Acute on chronic systolic (congestive) heart failure (HCC) Active Problems:   SOB (shortness of breath)   Acute respiratory failure with hypoxia (HCC)   Diabetes mellitus without complication (HCC)   Hypothyroid   Parkinson's disease ([place])   Hyperlipidemia      #) Acute on chronic systolic heart failure: dx of acute decompensation on the basis of presenting shortness of breath associated with orthopnea, increasing edema in the bilateral lower extremities, increased work of breathing, and interval increase in BNP.  Presentation is also associated with acute hypoxic respiratory distress, as further discussed below. this is in the context of a known history of chronic systolic heart failure, with most recent echocardiogram performed in June 2020 notable for LVEF 40 to 45%, although the remainder of this prior echocardiogram read is not currently available via my review of Care Everywhere.  . Etiology leading to presenting acutely decompensated heart failure currently unclear, and in the setting of no significant clinical improvement as an outpatient following aforementioned  increased dose in Lasix from 40 mg p.o. daily down to 100 g p.o. daily, the patient was instructed by her outpatient cardiologist, Dr. Nehemiah Massed to present to [facility] ED for evaluation of acute on chronic systolic heart consideration for admission associated need for IV diuresis. Patient also conveys good compliance with her additional outpatient cardiac medications, including Toprol-XL as well as enalapril. Overall, ACS leading to presenting acutely decompensated heart failure appears less likely at this time in the absence of any recent CP, presenting EKG showing no evidence of acute ischemic changes, as well as a non-elevated troponin, which is particularly reassuring given no  associated elevation 5 days into the onset of her shortness of breath.   Of note, patient received Lasix 60 mg IV x 1 while in the ED today. Presentation warrants additional IV diuresis, as further detailed below, with close monitoring of ensuing renal function, electrolytes, and volume status, as further noted below. Will also closely monitor ensuing HR as an additional means to evaluate volume status and help guide subsequent diuresis decision-making. As patient is already on a BB at home, will plan to continue this. Of note, I utilized the Heart Failure order set to assist with my placement of orders on this patient.   In terms of specific IV diuresis via Lasix, in the context of the significant improvement in respiratory status while on Lasix 100 mg p.o. daily, will initiate Lasix 40 mg IV twice daily, which will represent an effective increase in dose relative to Lasix 100 mg p.o. daily.  Additionally, as it appears that her most recent echocardiogram occurred greater than 2 years ago, will order repeat echocardiogram to be checked during this hospitalization.    Plan: monitor strict I's & O's and daily weights. Monitor on telemetry, including trend in HR in response to diuresis, as above. Monitor continuous pulse oximetry. Repeat  BMP in the morning, including for monitoring trend of potassium, bicarbonate, and renal function in response  to interval diuresis efforts. Add-on serum magnesium level, and repeat this level in the AM.  In the setting of presenting serum potassium 3.6, with plan for additional IV diuresis,  will order Kcl 20 mEq PO x 1 now. close monitoring of ensuing blood pressure response to diuresis efforts, including to help guide need for improvement in afterload reduction in order to optimize cardiac output. Lasix 40 mg IV twice daily. Continue outpatient BB, ACE-I .  Echocardiogram ordered for the morning, as above.  Nursing communication order placed requesting patient's bilateral lower extremities elevated to help facilitate decrease in swelling.      #) Acute hypoxic respiratory distress: In the context of patient's reported no supplemental oxygen requirements over the last several months, presenting O2 sat noted to be 88% on room air, with ensuing improvement to 96% on 2 L nasal cannula, thereby meeting criteria for acute hypoxic respiratory distress as opposed to acute hypoxic respiratory failure at this time.  This appears to be on the basis of presenting acute on chronic systolic heart failure, as further described above.  Of note, presenting chest x-ray shows no evidence of infiltrate, pleural effusion, or pneumothorax.  ACS is also felt to be less likely this time, as further detailed above.  Clinically, presentation is less suggestive of acute PE at this time.  Follow-up results of COVID-19/influenza PCR that were checked in the ED today, as these results remain pending at this time.  Clinically and radiographically, presentation less suggestive of underlying pneumonia. Will check procalcitonin to further evaluate.   Plan: further evaluation and management of presenting acute on chronic systolic heart failure, as above, including monitoring of continuous pulse oximetry with prn supplemental O2 to maintain  O2 sats greater than or equal to 92%. monitor on telemetry. Check CMP and CBC in the morning. Check serum Mg and Phos levels.  Follow-up result of screening COVID-19/influenza PCR.  Echocardiogram ordered for the morning.  Check procalcitonin.      #) Type 2 diabetes mellitus: On metformin as an outpatient in the absence of any exogenous insulin requirements.  Presenting blood sugar noted to be 212.  Plan: Hold metformin during this hospitalization.  Accu-Cheks before every meal and at bedtime with low-dose sliding scale insulin.      #) Chronic iron deficiency anemia: Documented history of such, on daily oral iron supplementation as an outpatient, with baseline hemoglobin noted to be 11-12.  Presenting hemoglobin consistent with this range, no evidence of active bleed at this time.  Plan: Repeat CBC in the morning.  Add on INR.  Continue outpatient daily oral iron supplementation.      #) Hyperlipidemia: On pravastatin as an outpatient.  Plan: Continue home statin.      #) Acquired hypothyroidism: On Synthroid as an outpatient.  Plan: Continue Synthroid.      #) Parkinson's disease: on Sinemet as an outpatient.   Plan: Continue home Sinemet.      DVT prophylaxis: Lovenox 40 mg subcu daily Code Status: Full code Family Communication: none Disposition Plan: Per Rounding Team Consults called: none;  Admission status: Inpatient; med telemetry     Of note, this patient was added by me to the following Admit List/Treatment Team: armcadmits.    Of note, the Adult Admission Order Set (Multimorbid order set) was used by me in the admission process for this patient.   PLEASE NOTE THAT DRAGON DICTATION SOFTWARE WAS USED IN THE CONSTRUCTION OF THIS NOTE.   [facility]  Pager [phone] From [place]  Otherwise, please contact night-coverage  [email or web address] Password [facility]   07/20/2021, 9:37 PM

## 2021-07-20 NOTE — ED Notes (Signed)
Primary contact: [name] - daughter [phone]  Secondary contact: Lavae Arcidiacono - son  [phone]

## 2021-07-20 NOTE — ED Notes (Signed)
PHARMACY: Daughter states pt has taken all of her medications for today other than her 10 pm med Carbidopa/Levodopa Daughter also states that patient's doctor increased thyroid medication today.

## 2021-07-20 NOTE — ED Triage Notes (Signed)
Pt brought in by daughter for eval of swelling and fluid retention.pth as sob.  Pt denies chest pain.   Pt was seen today at dr [facility] office and sent for eval.  Pt has bruising to face from a recent fall.  Pt is sitting in a walker chair.  Pt alert  speech clear.

## 2021-07-20 NOTE — ED Notes (Signed)
At bedside with lasix. Assessed pt's arms for IV placement. Pt hard stick. Will have 2nd RN assess for IV access.

## 2021-07-20 NOTE — ED Notes (Signed)
Tom RN at bedside.

## 2021-07-20 NOTE — ED Notes (Signed)
Tom RN states will attempt to place IV soon. Will have 3rd RN try or place IV team consult if needed.

## 2021-07-20 NOTE — ED Provider Notes (Signed)
Emergency Medicine Provider Triage Evaluation Note  RAASHIDA MORANDO , a 75 y.o. female  was evaluated in triage.  Pt complains of shortness of breath and fluid retention. Sent from cardiology for 10lb weight gain. No relief with increase in Lasix from '40mg'$  to '80mg'$ ..  Review of Systems  Positive: Shortness of breath Negative: Chest pain  Physical Exam  There were no vitals taken for this visit. Gen:   Awake, no distress   Resp:  Normal effort  MSK:   Moves extremities without difficulty  Other:    Medical Decision Making  Medically screening exam initiated at 5:57 PM.  Appropriate orders placed.  HERTHA CAMPOSANO was informed that the remainder of the evaluation will be completed by another provider, this initial triage assessment does not replace that evaluation, and the importance of remaining in the ED until their evaluation is complete.    Victorino Dike, FNP 07/20/21 1759    Vladimir Crofts, MD 07/20/21 1843

## 2021-07-20 NOTE — ED Provider Notes (Signed)
[facility] Emergency Department Provider Note  Time seen: 9:10 PM  I have reviewed the triage vital signs and the nursing notes.   HISTORY  Chief Complaint Leg Swelling and Shortness of Breath   HPI Stephanie [name] is a 75 y.o. female with a past medical history of anemia, diabetes, gastric reflux, hypertension, hyperlipidemia, CHF, presents to the emergency department from her cardiology office for fluid overload.  According to the patient and her daughter patient has a history of CHF has had worsening fluid accumulation with lower extremity edema and now weepage.  Worsening shortness of breath that has acutely worsened over the past 2 to 3 days.  Patient was seen by Dr. Nehemiah Massed of cardiology today, they had increased her Lasix on Saturday but have not noted any improvement in the patient's presented to the emergency department today.  Here the patient is satting around 88% on room air, she does wear 2 L of oxygen if needed at home which she does typically not needed but has needed recently.  Patient satting in the mid 90s on 2 L.  Denies any chest pain.  Patient does have bruising to her face which she states was from a fall approximately 2 weeks ago, she is already been worked up for the fall.    Past Medical History:  Diagnosis Date   Anemia    Arthritis    Chronic back pain    Diabetes mellitus without complication (HCC)    Dysrhythmia    GERD (gastroesophageal reflux disease)    Hyperlipidemia    Hypertension    Hypothyroid    Multilevel degenerative disc disease    Parkinson's disease ([place])    Sleep apnea    Syncope    Tremors of nervous system    Wears dentures    full upper and lower    Patient Active Problem List   Diagnosis Date Noted   Hyponatremia 12/31/2015    Past Surgical History:  Procedure Laterality Date   ABDOMINAL HYSTERECTOMY     BACK SURGERY  09/16   CARDIAC CATHETERIZATION     CATARACT EXTRACTION W/PHACO Right 11/24/2015    Procedure: CATARACT EXTRACTION PHACO AND INTRAOCULAR LENS PLACEMENT ([place]);  Surgeon: Estill Cotta, MD;  Location: [facility];  Service: Ophthalmology;  Laterality: Right;  [place] 01:14 AP% 25.5 CDE 30.69 fluid pack lot # [id number]   CATARACT EXTRACTION W/PHACO Left 01/06/2021   Procedure: CATARACT EXTRACTION PHACO AND INTRAOCULAR LENS PLACEMENT (I[facility]) LEFT DIABETIC 6.98 00:40.1;  Surgeon: Birder Robson, MD;  Location: [place];  Service: Ophthalmology;  Laterality: Left;  Diabetic - oral meds   COLONOSCOPY     COLONOSCOPY WITH PROPOFOL N/A 07/24/2019   Procedure: COLONOSCOPY WITH PROPOFOL;  Surgeon: Toledo, Benay Pike, MD;  Location: [facility];  Service: Gastroenterology;  Laterality: N/A;   ESOPHAGOGASTRODUODENOSCOPY N/A 07/24/2019   Procedure: ESOPHAGOGASTRODUODENOSCOPY (EGD);  Surgeon: Toledo, Benay Pike, MD;  Location: [facility];  Service: Gastroenterology;  Laterality: N/A;   OOPHORECTOMY      Prior to Admission medications   Medication Sig Start Date End Date Taking? Authorizing Provider  allopurinol (ZYLOPRIM) 100 MG tablet Take 100 mg by mouth daily.    [provider]  amantadine (SYMMETREL) 100 MG capsule Take 100 mg by mouth 2 (two) times daily.    [provider]  Aspirin-Acetaminophen-Caffeine (GOODY HEADACHE PO) Take by mouth.    [provider]  carbidopa-levodopa (SINEMET CR) 50-200 MG tablet Take 1 tablet by mouth at bedtime.  [provider]  carbidopa-levodopa (SINEMET IR) 25-100 MG tablet Take 2.5 tablets by mouth 4 (four) times daily.    [provider]  colchicine 0.6 MG tablet Take 1 tablet (0.6 mg total) by mouth daily. 09/30/17 10/30/17  Gregor Hams, MD  Continuous Blood Gluc Sensor (FREESTYLE LIBRE 14 DAY SENSOR) MISC by Does not apply route.    [provider]  Cyanocobalamin (VITAMIN B-12 PO) Take by mouth daily.    [provider]  docusate sodium (COLACE) 100 MG capsule Take  100 mg by mouth 2 (two) times daily.    [provider]  enalapril (VASOTEC) 20 MG tablet Take 20 mg by mouth 2 (two) times daily.     [provider]  ferrous sulfate 325 (65 FE) MG tablet Take 325 mg by mouth daily with breakfast.    [provider]  fluticasone (FLONASE) 50 MCG/ACT nasal spray Place 1-2 sprays into the nose as needed. 09/29/15   [provider]  furosemide (LASIX) 40 MG tablet Take 40 mg by mouth daily as needed.    [provider]  gabapentin (NEURONTIN) 300 MG capsule Take 300 mg by mouth at bedtime.    [provider]  glucose 4 GM chewable tablet Chew 1 tablet by mouth as needed for low blood sugar.    [provider]  levothyroxine (SYNTHROID, LEVOTHROID) 25 MCG tablet Take 25 mcg by mouth daily before breakfast.    [provider]  MAGNESIUM PO Take by mouth daily.    [provider]  meloxicam (MOBIC) 15 MG tablet Take 15 mg by mouth daily. Patient not taking: Reported on 12/30/2020    [provider]  metFORMIN (GLUCOPHAGE) 1000 MG tablet Take 1,000 mg by mouth 2 (two) times daily with a meal.    [provider]  metoprolol succinate (TOPROL-XL) 50 MG 24 hr tablet Take 50 mg by mouth daily. Take with or immediately following a meal.    [provider]  Multiple Vitamin (MULTIVITAMIN WITH MINERALS) TABS tablet Take 1 tablet by mouth daily.    [provider]  oxyCODONE-acetaminophen (PERCOCET/ROXICET) 5-325 MG tablet Take 1 tablet by mouth every 6 (six) hours as needed for severe pain.    [provider]  OXYGEN Inhale into the lungs as needed.    [provider]  pantoprazole (PROTONIX) 40 MG tablet Take 1 tablet (40 mg total) by mouth daily. Patient not taking: Reported on 12/30/2020 01/02/16   Bettey Costa, MD  pramipexole (MIRAPEX) 0.125 MG tablet Take 0.125 mg by mouth 4 (four) times daily as needed.    [provider]  pravastatin  (PRAVACHOL) 40 MG tablet Take 40 mg by mouth daily.    [provider]  spironolactone (ALDACTONE) 50 MG tablet Take 50 mg by mouth 2 (two) times daily. Patient not taking: Reported on 12/30/2020    [provider]  glimepiride (AMARYL) 4 MG tablet Take 4 mg by mouth 2 (two) times daily. Pt. Takes 1 tablet in the morning and .5 tablet at night.  12/05/20  [provider]  pioglitazone (ACTOS) 30 MG tablet Take 30 mg by mouth daily.  12/05/20  [provider]    No Known Allergies  Family History  Problem Relation Age of Onset   Breast cancer Paternal Aunt     Social History Social History   Tobacco Use   Smoking status: Never   Smokeless tobacco: Never  Vaping Use   Vaping Use:  Never used  Substance Use Topics   Alcohol use: Not Currently    Comment: rare   Drug use: No    Review of Systems Constitutional: Negative for fever. Cardiovascular: Negative for chest pain. Respiratory: Negative for shortness of breath. Gastrointestinal: Negative for abdominal pain, vomiting  Musculoskeletal: Negative for musculoskeletal complaints Neurological: Negative for headache All other ROS negative  ____________________________________________   PHYSICAL EXAM:  VITAL SIGNS: ED Triage Vitals  Enc Vitals Group     BP 07/20/21 1759 140/74     Pulse Rate 07/20/21 1759 82     Resp 07/20/21 1759 (!) 22     Temp 07/20/21 1759 98.3 F (36.8 C)     Temp Source 07/20/21 1759 Oral     SpO2 07/20/21 1759 96 %     Weight 07/20/21 1800 195 lb (88.5 kg)     Height 07/20/21 1800 '5\' 1"'$  (1.549 m)     Head Circumference --      Peak Flow --      Pain Score 07/20/21 1800 10     Pain Loc --      Pain Edu? --      Excl. in [place]? --    Constitutional: Awake alert significant respiratory distress moving from the stretcher to the bed. Eyes: Normal exam ENT      Head: Normocephalic and atraumatic.      Mouth/Throat: Mucous membranes are moist. Cardiovascular: Normal  rate, regular rhythm. Respiratory: Increased work of breathing, respiratory rate in the mid 20s at rest however was in the mid 30s after moving to the bed. Gastrointestinal: Patient has edematous lower abdomen.  Nontender to palpation. Musculoskeletal: Lower extreme edema bilaterally with leg wraps in place but weeping through the wraps. Neurologic:  Normal speech and language. No gross focal neurologic deficits  Skin:  Skin is warm, dry and intact.  Psychiatric: Mood and affect are normal.  ____________________________________________    EKG  EKG viewed and interpreted by myself shows a normal sinus rhythm 84 bpm with a widened QRS, left axis deviation, largely normal intervals, morphology most consistent with left bundle branch block.  No concerning ST elevation.  ____________________________________________    RADIOLOGY  Chest x-ray shows cardiomegaly otherwise stable  ____________________________________________   INITIAL IMPRESSION / ASSESSMENT AND PLAN / ED COURSE  Pertinent labs & imaging results that were available during my care of the patient were reviewed by me and considered in my medical decision making (see chart for details).   Patient presents to the emergency department for worsening shortness of breath despite increasing her Lasix as an outpatient.  Patient has what appears to be significant lower extremity edema, shortness of breath much worse with minimal exertion such as moving from the stretcher to the bed.  Patient satting 88% on room air placed on 2 L satting 96% currently.  Troponin negative.  BNP is elevated.  Chest x-ray shows CHF but no frank edema.  Given the patient's significant shortness of breath with exertion worsening despite outpatient treatment we will admit for IV diuresis.  Patient agreeable to plan of care.  OLISA BRITTO was evaluated in Emergency Department on 07/20/2021 for the symptoms described in the history of present illness. She was  evaluated in the context of the global COVID-19 pandemic, which necessitated consideration that the patient might be at risk for infection with the SARS-CoV-2 virus that causes COVID-19. Institutional protocols and algorithms that pertain to the evaluation of patients at risk for COVID-19 are in a  state of rapid change based on information released by regulatory bodies including the CDC and federal and state organizations. These policies and algorithms were followed during the patient's care in the ED.  ____________________________________________   FINAL CLINICAL IMPRESSION(S) / ED DIAGNOSES  CHF exacerbation   Harvest Dark, MD 07/20/21 2115

## 2021-07-20 NOTE — ED Notes (Signed)
Called lab. Elmyra Ricks in lab confirms that trop was added on at 2000.

## 2021-07-21 ENCOUNTER — Inpatient Hospital Stay
Admit: 2021-07-21 | Discharge: 2021-07-21 | Disposition: A | Discharge status: Home / Self Care | Payer: Medicare Other | Attending: Internal Medicine | Admitting: Internal Medicine

## 2021-07-21 ENCOUNTER — Inpatient Hospital Stay: Payer: Medicare Other

## 2021-07-21 DIAGNOSIS — I5023 Acute on chronic systolic (congestive) heart failure: Secondary | ICD-10-CM | POA: Diagnosis not present

## 2021-07-21 LAB — URINALYSIS, COMPLETE (UACMP) WITH MICROSCOPIC
Bilirubin Urine: NEGATIVE
Glucose, UA: NEGATIVE mg/dL
Glucose, UA: NEGATIVE mg/dL
Hgb urine dipstick: NEGATIVE
Hgb urine dipstick: NEGATIVE
Ketones, ur: NEGATIVE mg/dL
Leukocytes,Ua: NEGATIVE
Nitrite: NEGATIVE
Nitrite: NEGATIVE
Protein, ur: 30 mg/dL — AB
Protein, ur: NEGATIVE mg/dL
Specific Gravity, Urine: 1.02 (ref 1.005–1.030)
Specific Gravity, Urine: >1.03 — ABNORMAL HIGH (ref 1.005–1.030)
pH: 5.5 (ref 5.0–8.0)
pH: 6 (ref 5.0–8.0)

## 2021-07-21 LAB — COMPREHENSIVE METABOLIC PANEL
ALT: 5 U/L (ref 0–44)
AST: 21 U/L (ref 15–41)
Albumin: 2.7 g/dL — ABNORMAL LOW (ref 3.5–5.0)
Alkaline Phosphatase: 97 U/L (ref 38–126)
Anion gap: 9 (ref 5–15)
BUN: 18 mg/dL (ref 8–23)
CO2: 36 mmol/L — ABNORMAL HIGH (ref 22–32)
Calcium: 8.3 mg/dL — ABNORMAL LOW (ref 8.9–10.3)
Chloride: 97 mmol/L — ABNORMAL LOW (ref 98–111)
Creatinine, Ser: 0.71 mg/dL (ref 0.44–1.00)
GFR, Estimated: >60 mL/min (ref >60)
Glucose, Bld: 134 mg/dL — ABNORMAL HIGH (ref 70–99)
Potassium: 3.6 mmol/L (ref 3.5–5.1)
Sodium: 142 mmol/L (ref 135–145)
Total Bilirubin: 1.3 mg/dL — ABNORMAL HIGH (ref 0.3–1.2)
Total Protein: 7 g/dL (ref 6.5–8.1)

## 2021-07-21 LAB — PROTIME-INR
INR: 1.2 (ref 0.8–1.2)
Prothrombin Time: 15.3 seconds — ABNORMAL HIGH (ref 11.4–15.2)

## 2021-07-21 LAB — CBC
HCT: 38 % (ref 36.0–46.0)
Hemoglobin: 11.3 g/dL — ABNORMAL LOW (ref 12.0–15.0)
MCH: 24.9 pg — ABNORMAL LOW (ref 26.0–34.0)
MCHC: 29.7 g/dL — ABNORMAL LOW (ref 30.0–36.0)
MCV: 83.7 fL (ref 80.0–100.0)
Platelets: 260 10*3/uL (ref 150–400)
RBC: 4.54 MIL/uL (ref 3.87–5.11)
RDW: 17.6 % — ABNORMAL HIGH (ref 11.5–15.5)
WBC: 6.5 10*3/uL (ref 4.0–10.5)
nRBC: 0 % (ref 0.0–0.2)

## 2021-07-21 LAB — TROPONIN I (HIGH SENSITIVITY): Troponin I (High Sensitivity): 17 ng/L (ref <18)

## 2021-07-21 LAB — CBG MONITORING, ED
Glucose-Capillary: 112 mg/dL — ABNORMAL HIGH (ref 70–99)
Glucose-Capillary: 122 mg/dL — ABNORMAL HIGH (ref 70–99)
Glucose-Capillary: 129 mg/dL — ABNORMAL HIGH (ref 70–99)
Glucose-Capillary: 204 mg/dL — ABNORMAL HIGH (ref 70–99)
Glucose-Capillary: 159 mg/dL — ABNORMAL HIGH (ref 70–99)

## 2021-07-21 LAB — D-DIMER, QUANTITATIVE: D-Dimer, Quant: 1.69 ug/mL-FEU — ABNORMAL HIGH (ref 0.00–0.50)

## 2021-07-21 LAB — PHOSPHORUS: Phosphorus: 3.2 mg/dL (ref 2.5–4.6)

## 2021-07-21 LAB — MAGNESIUM: Magnesium: 1.5 mg/dL — ABNORMAL LOW (ref 1.7–2.4)

## 2021-07-21 MED ORDER — CARBIDOPA-LEVODOPA ER 50-200 MG PO TBCR
1.0000 | EXTENDED_RELEASE_TABLET | Freq: Every day | ORAL | Status: DC
  Administered 2021-07-21 – 2021-07-27 (×7): 1 via ORAL
  Filled 2021-07-21 (×9): qty 1

## 2021-07-21 MED ORDER — FUROSEMIDE 10 MG/ML IJ SOLN: 40.0000 mg | Freq: Two times a day (BID) | INTRAMUSCULAR | Status: DC

## 2021-07-21 MED ORDER — LEVOTHYROXINE SODIUM 50 MCG PO TABS
75.0000 ug | ORAL_TABLET | Freq: Every day | ORAL | Status: DC
  Administered 2021-07-21 – 2021-07-28 (×8): 75 ug via ORAL
  Filled 2021-07-21 (×8): qty 1

## 2021-07-21 MED ORDER — POTASSIUM CHLORIDE CRYS ER 20 MEQ PO TBCR
40.0000 meq | EXTENDED_RELEASE_TABLET | Freq: Once | ORAL | Status: AC
  Administered 2021-07-21: 40 meq via ORAL
  Filled 2021-07-21: qty 2

## 2021-07-21 MED ORDER — FUROSEMIDE 10 MG/ML IJ SOLN
60.0000 mg | Freq: Two times a day (BID) | INTRAMUSCULAR | Status: DC
  Administered 2021-07-21: 60 mg via INTRAVENOUS
  Filled 2021-07-21: qty 8

## 2021-07-21 MED ORDER — MAGNESIUM SULFATE 50 % IJ SOLN
3.0000 g | Freq: Once | INTRAVENOUS | Status: AC
  Administered 2021-07-21: 3 g via INTRAVENOUS
  Filled 2021-07-21: qty 6

## 2021-07-21 NOTE — Consult Note (Addendum)
CARDIOLOGY CONSULT NOTE               [name] ID: Stephanie [name] MRN: [id number] DOB/AGE: 03/21/1946 75 y.o.  Admit date: 07/20/2021 Referring Physician: Rhetta Mura, DO Primary Physician: [name], MD Primary Cardiologist: Flossie Dibble, MD Reason for Consultation: CHF exacerbation  HPI: Stephanie [name] is a 75 year old female [name] with PMH significant for chronic systolic CHF (most recent EF = 40-45%), LBBB, hypertension, hyperlipidemia, diabetes mellitus type 2, chronic iron deficiency anemia, Parkinson's disease and obesity who presented to [facility] ER due to worsening shortness of breath and found to be in CHF exacerbation.  ED course: [name] presented from Cardiology outpatient clinic with increasing dyspnea, weight gain and BLE edema. She was hypoxic with o2 saturations around 88% on room air with improvements to 96% on 2L of supplemental O2. Her BNP was elevated at 980.6, high-sensitivity troponin was negative x2, Magnesium decreased at 1.5, ECG revealed SR with occasional PVC's and negative for any evident ischemic changes. CXR revealed stable cardiomegaly with mild, stable bibasilar linear scarring and/or atelectasis. D-dimer was elevated at 1.69. Stephanie [name] was treated with IV lasix and Cardiology was consulted.   Upon examination, Stephanie [name] reports that her dyspnea has slightly improved, however, she continues to feel "congested." Stephanie [name] denies having any chest pain, palpitations, dizziness or syncope at this time. She states that she wears 2L of supplemental O2 at home due to her dyspnea. She reports that she did not respond to Stephanie changes in her lasix dosage as an outpatient; however, she was compliant with all of her current medications. Stephanie [name] also reports that she also suffered a fall a few weeks ago in which she hit her head and sustained injuries to her face and nose.   Review of systems complete and found to be negative unless listed  above    Past Medical History:  Diagnosis Date   Anemia    Arthritis    Chronic back pain    Diabetes mellitus without complication (HCC)    Dysrhythmia    GERD (gastroesophageal reflux disease)    Hyperlipidemia    Hypertension    Hypothyroid    Multilevel degenerative disc disease    Parkinson's disease ([place])    Sleep apnea    Syncope    Tremors of nervous system    Wears dentures    full upper and lower    Past Surgical History:  Procedure Laterality Date   ABDOMINAL HYSTERECTOMY     BACK SURGERY  09/16   CARDIAC CATHETERIZATION     CATARACT EXTRACTION W/PHACO Right 11/24/2015   Procedure: CATARACT EXTRACTION PHACO AND INTRAOCULAR LENS PLACEMENT ([place]);  Surgeon: Estill Cotta, MD;  Location: [facility];  Service: Ophthalmology;  Laterality: Right;  [place] 01:14 AP% 25.5 CDE 30.69 fluid pack lot # [id number]   CATARACT EXTRACTION W/PHACO Left 01/06/2021   Procedure: CATARACT EXTRACTION PHACO AND INTRAOCULAR LENS PLACEMENT ([facility]) LEFT DIABETIC 6.98 00:40.1;  Surgeon: Birder Robson, MD;  Location: [place];  Service: Ophthalmology;  Laterality: Left;  Diabetic - oral meds   COLONOSCOPY     COLONOSCOPY WITH PROPOFOL N/A 07/24/2019   Procedure: COLONOSCOPY WITH PROPOFOL;  Surgeon: Toledo, Benay Pike, MD;  Location: [facility];  Service: Gastroenterology;  Laterality: N/A;   ESOPHAGOGASTRODUODENOSCOPY N/A 07/24/2019   Procedure: ESOPHAGOGASTRODUODENOSCOPY (EGD);  Surgeon: Toledo, Benay Pike, MD;  Location: [facility];  Service: Gastroenterology;  Laterality: N/A;   OOPHORECTOMY      (Not in  a hospital admission)  Social History   Socioeconomic History   Marital status: Divorced    Spouse name: Not on file   Number of children: Not on file   Years of education: Not on file   Highest education level: Not on file  Occupational History   Not on file  Tobacco Use   Smoking status: Never   Smokeless tobacco: Never  Vaping Use   Vaping Use: Never used   Substance and Sexual Activity   Alcohol use: Not Currently    Comment: rare   Drug use: No   Sexual activity: Not Currently  Other Topics Concern   Not on file  Social History Narrative   Not on file   Social Determinants of Health   Financial Resource Strain: Not on file  Food Insecurity: Not on file  Transportation Needs: Not on file  Physical Activity: Not on file  Stress: Not on file  Social Connections: Not on file  Intimate Partner Violence: Not on file    Family History  Problem Relation Age of Onset   Breast cancer Paternal Aunt       Review of systems complete and found to be negative unless listed above      PHYSICAL EXAM  General: Well developed, well nourished, in no acute distress HEENT:  Normocephalic and atramatic, facial bruising noted to Stephanie right of Stephanie face Neck:  No JVD.  Lungs: Clear bilaterally to auscultation. Chest expansion symmetrical, no wheezes, rales or rhonchi Heart: HRRR . Normal S1 and S2 without gallops or murmurs.  Abdomen: Bowel sounds are positive, abdomen soft and non-tender  Msk:  Normal strength and tone for age. Extremities: moderate, 2-3 pitting edema noted to Stephanie BLE with erythema. No clubbing or cyanosis  Neuro: Alert and oriented X 3. Psych:  Good affect, responds appropriately  Labs:   Lab Results  Component Value Date   WBC 6.5 07/21/2021   HGB 11.3 (L) 07/21/2021   HCT 38.0 07/21/2021   MCV 83.7 07/21/2021   PLT 260 07/21/2021    Recent Labs  Lab 07/21/21 0737  NA 142  K 3.6  CL 97*  CO2 36*  BUN 18  CREATININE 0.71  CALCIUM 8.3*  PROT 7.0  BILITOT 1.3*  ALKPHOS 97  ALT 5  AST 21  GLUCOSE 134*   Lab Results  Component Value Date   TROPONINI <0.03 12/31/2015   No results found for: CHOL No results found for: HDL No results found for: LDLCALC No results found for: TRIG No results found for: CHOLHDL No results found for: LDLDIRECT    Radiology: DG Chest 2 View  Result Date:  07/20/2021 CLINICAL DATA:  Shortness of breath. EXAM: CHEST - 2 VIEW COMPARISON:  July 06, 2021 FINDINGS: Mild, chronic appearing diffusely increased interstitial lung markings are seen. Mild, stable areas of linear scarring and/or atelectasis are noted within Stephanie bilateral lung bases. There is no evidence of a pleural effusion or pneumothorax. Stephanie cardiac silhouette is mildly enlarged and unchanged in size. Degenerative changes seen throughout Stephanie thoracic spine. IMPRESSION: 1. Stable cardiomegaly with mild, stable bibasilar linear scarring and/or atelectasis. Electronically Signed   By: Virgina Norfolk M.D.   On: 07/20/2021 19:08   DG Chest 2 View  Result Date: 07/06/2021 CLINICAL DATA:  Shortness of breath EXAM: CHEST - 2 VIEW COMPARISON:  12/05/2020 FINDINGS: Cardiomegaly. Right mid lung and lingular atelectasis. Mild vascular congestion. No overt edema. No effusions or acute bony abnormality. IMPRESSION: Cardiomegaly, vascular congestion.  Areas of atelectasis bilaterally. Electronically Signed   By: Rolm Baptise M.D.   On: 07/06/2021 22:03   CT HEAD WO CONTRAST (5MM)  Result Date: 07/06/2021 CLINICAL DATA:  Fall EXAM: CT HEAD WITHOUT CONTRAST TECHNIQUE: Contiguous axial images were obtained from Stephanie base of Stephanie skull through Stephanie vertex without intravenous contrast. COMPARISON:  None. FINDINGS: Brain: There is atrophy and chronic small vessel disease changes. No acute intracranial abnormality. Specifically, no hemorrhage, hydrocephalus, mass lesion, acute infarction, or significant intracranial injury. Vascular: No hyperdense vessel or unexpected calcification. Skull: No acute calvarial abnormality. Sinuses/Orbits: No acute findings Other: None IMPRESSION: Atrophy, chronic microvascular disease. No acute intracranial abnormality. Electronically Signed   By: Rolm Baptise M.D.   On: 07/06/2021 22:32   CT Cervical Spine Wo Contrast  Result Date: 07/06/2021 CLINICAL DATA:  Fall, hit face EXAM: CT  CERVICAL SPINE WITHOUT CONTRAST TECHNIQUE: Multidetector CT imaging of Stephanie cervical spine was performed without intravenous contrast. Multiplanar CT image reconstructions were also generated. COMPARISON:  None. FINDINGS: Alignment: No subluxation Skull base and vertebrae: No acute fracture. No primary bone lesion or focal pathologic process. Soft tissues and spinal canal: No prevertebral fluid or swelling. No visible canal hematoma. Disc levels: Advanced diffuse degenerative disc disease and facet disease. Upper chest: No acute findings Other: None IMPRESSION: Advanced degenerative changes.  No acute bony abnormality. Electronically Signed   By: Rolm Baptise M.D.   On: 07/06/2021 22:34   CT Maxillofacial Wo Contrast  Result Date: 07/06/2021 CLINICAL DATA:  Fall, hit face EXAM: CT MAXILLOFACIAL WITHOUT CONTRAST TECHNIQUE: Multidetector CT imaging of Stephanie maxillofacial structures was performed. Multiplanar CT image reconstructions were also generated. COMPARISON:  None. FINDINGS: Osseous: No fracture or mandibular dislocation. No destructive process. Orbits: Negative. No traumatic or inflammatory finding. Sinuses: Mucosal thickening in Stephanie floor of Stephanie left maxillary sinus. Soft tissues: Soft tissue swelling over Stephanie right orbit and face. Limited intracranial: See head CT report IMPRESSION: No facial or orbital fracture. Electronically Signed   By: Rolm Baptise M.D.   On: 07/06/2021 22:33    EKG: SR with occasional PVC's with underlying LBBB  ASSESSMENT AND PLAN:  Stephanie [name] is a 75 year old female [name] with PMH significant for chronic systolic CHF (most recent EF = 40-45%), LBBB, hypertension, hyperlipidemia, diabetes mellitus type 2, chronic iron deficiency anemia, Parkinson's disease and obesity who presented to [facility] ER due to worsening shortness of breath and found to be in CHF exacerbation. BNP was elevated at 980.6, high-sensitivity troponin was negative x2, ECG revealed SR with occasional PVC's  with morphology consistent with LBBB and negative for any evident ischemic changes. CXR revealed stable cardiomegaly with mild, stable bibasilar linear scarring and/or atelectasis. D-dimer was elevated at 1.69. [name] was started on aggressive IV diuresis with lasix and appears to be responding well at this time. Stephanie [name] continues to be on supplemental oxygen with approving O2 saturations.   CHF exacerbation, reasonably stable at this time, [name] appears hypervolemic at this time with dyspnea and BLE edema, CXR remarkable for stable cardiomegaly Agree with admission to telemetry unit. Continuous cardiac telemetry monitoring until discharge. Proceed with echocardiogram for further evaluation of LV function and EF. Continue diuresis with IV Lasix.  Consider transitioning to oral torsemide therapy once [name] is diuresed and stable.  Continue ACEi and beta-blocker therapy. Monitor strict I's and O's. Perform daily weights. Low-sodium, heart healthy diet.  Dyspnea, chronic, likely due to underlying CHF with exacerbation at this time Continue supplemental oxygen therapy. Consider RT  evaluation.  Elevated D-dimer Recommend CT angio to rule out PE.   HTN, reasonably controlled, [name] is normotensive at this time Continue management with ACE inhibitor and beta-blocker therapy. Monitor blood pressure per protocol.  HLD, reasonably stable Continue statin therapy.   DM2, fairly controlled Continue CBG monitoring and sliding scale insulin per protocol.  Electrolyte imbalance, hypomagnesia noted upon arrival Trend CMP.  Signed: Gladstone Pih APRN, ACNPC-AG 07/21/2021, 12:18 PM

## 2021-07-21 NOTE — ED Notes (Signed)
Pt working with PT at this time

## 2021-07-21 NOTE — Evaluation (Signed)
Physical Therapy Evaluation Patient Details Name: Stephanie [name] MRN: [id number] DOB: 1946-06-12 Today's Date: 07/21/2021  History of Present Illness  75 year old female patient with PMH significant for chronic systolic CHF (most recent EF = 40-45%), LBBB, hypertension, hyperlipidemia, diabetes mellitus type 2, chronic iron deficiency anemia, Parkinson's disease and obesity who presented to the ER due to worsening shortness of breath and found to be in CHF exacerbation.  Clinical Impression  Pt was confident that she could do some in-room mobility/ambulation.  She initially struggled with getting up to EOB and to appropriate standing, but once she "brushed the rust off" she was able to safely do some in-room ambulation and show reasonable safety and awareness.  She is not at all near her baseline of 9 months ago, but does acknowledge that she will continue to need to stay with daughter and is open to the idea of having HHPT come out to the home to continue working on her strength, mobility, etc.      Recommendations for follow up therapy are one component of a multi-disciplinary discharge planning process, led by the attending physician.  Recommendations may be updated based on patient status, additional functional criteria and insurance authorization.  Follow Up Recommendations Home health PT    Equipment Recommendations  None recommended by PT    Recommendations for Other Services       Precautions / Restrictions Precautions Precautions: None Restrictions Weight Bearing Restrictions: No      Mobility  Bed Mobility Overal bed mobility: Needs Assistance Bed Mobility: Supine to Sit;Sit to Supine     Supine to sit: Mod assist Sit to supine: Mod assist   General bed mobility comments: Pt needed heavy UE use (simulating) rail to get to and maintain sitting, similarly needed considerable assist getting LEs back to bed    Transfers Overall transfer level: Modified  independent Equipment used: Rolling walker (2 wheeled)             General transfer comment: Pt able to scoot to EOB and get feet on the ground, essentially already standing once she did this.  She was too short to easily get up into the bed and did need assist to get up into bed  Ambulation/Gait Ambulation/Gait assistance: Min guard Gait Distance (Feet): 25 Feet Assistive device: Rolling walker (2 wheeled)       General Gait Details: Pt was able to ambulate in the room with shuffling, but relatively safe effort. She states feeling weaker than her normal but that she does not feel too far off regarding safety/confidence.  Stairs            Wheelchair Mobility    Modified Rankin (Stroke Patients Only)       Balance Overall balance assessment: Needs assistance Sitting-balance support: Bilateral upper extremity supported Sitting balance-Leahy Scale: Fair Sitting balance - Comments: Pt initially struggling to stay upright at EOB, once assisted toward EOB and cued for appropriate UE use she did manage to maintain sitting     Standing balance-Leahy Scale: Fair Standing balance comment: Pt was initially struggling to shift weight forward at standing EOB, eventually did get weight forward onto UEs/walker and maintain balance                             Pertinent Vitals/Pain Pain Assessment:  (b/l LEs tender, swollen, sensitive to palpation)    Home Living Family/patient expects to be discharged to:: Private residence Living Arrangements: Children  Available Help at Discharge: Family   Home Access: Stairs to enter Entrance Stairs-Rails:  (yes) Entrance Stairs-Number of Steps: 5 ((multiple entires with not more than 5 steps, all with at least 1 rail))   Home Equipment: Walker - 4 wheels      Prior Function Level of Independence: Independent with assistive device(s)               Hand Dominance        Extremity/Trunk Assessment   Upper  Extremity Assessment Upper Extremity Assessment: Generalized weakness    Lower Extremity Assessment Lower Extremity Assessment: Generalized weakness       Communication   Communication: No difficulties  Cognition Arousal/Alertness: Awake/alert Behavior During Therapy: WFL for tasks assessed/performed                                          General Comments General comments (skin integrity, edema, etc.): Pt indicates that prior to hospitalization in Jan she was independent and active, now does live with and rely somewhat on daughter.    Exercises     Assessment/Plan    PT Assessment Patient needs continued PT services  PT Problem List Decreased strength;Decreased range of motion;Decreased activity tolerance;Decreased balance;Decreased mobility;Decreased cognition;Decreased knowledge of use of DME;Decreased safety awareness       PT Treatment Interventions DME instruction;Gait training;Stair training;Functional mobility training;Therapeutic activities;Therapeutic exercise;Balance training;Patient/family education;Neuromuscular re-education    PT Goals (Current goals can be found in the Care Plan section)  Acute Rehab PT Goals Patient Stated Goal: go back to daughter's home PT Goal Formulation: With patient Time For Goal Achievement: 08/04/21 Potential to Achieve Goals: Good    Frequency Min 2X/week   Barriers to discharge        Co-evaluation               AM-PAC PT "6 Clicks" Mobility  Outcome Measure Help needed turning from your back to your side while in a flat bed without using bedrails?: A Little Help needed moving from lying on your back to sitting on the side of a flat bed without using bedrails?: A Lot Help needed moving to and from a bed to a chair (including a wheelchair)?: A Little Help needed standing up from a chair using your arms (e.g., wheelchair or bedside chair)?: A Little Help needed to walk in hospital room?: A  Little Help needed climbing 3-5 steps with a railing? : A Lot 6 Click Score: 16    End of Session Equipment Utilized During Treatment: Gait belt Activity Tolerance: Patient tolerated treatment well Patient left: in bed;with call bell/phone within reach;with nursing/sitter in room Nurse Communication: Mobility status PT Visit Diagnosis: Muscle weakness (generalized) (M62.81);Difficulty in walking, not elsewhere classified (R26.2);Unsteadiness on feet (R26.81)    Time: 2229-7989 PT Time Calculation (min) (ACUTE ONLY): 26 min   Charges:   PT Evaluation $PT Eval Low Complexity: 1 Low PT Treatments $Gait Training: 8-22 mins        Kreg Shropshire, DPT 07/21/2021, 1:40 PM

## 2021-07-21 NOTE — Progress Notes (Signed)
PT Cancellation Note  Patient Details Name: Stephanie [name] MRN: [id number] DOB: 09/23/1946   Cancelled Treatment:    Reason Eval/Treat Not Completed: Other (comment) Pt eating breakfast and unwilling to consider doing anything further as "It's been 24 hours since I ate anything."  Assisted pt in scooting up in bed to make eating safer and more comfortable.  Hope to try back later today per pt and PT schedule.  Kreg Shropshire, DPT 07/21/2021, 10:16 AM

## 2021-07-21 NOTE — ED Notes (Signed)
Pt changed into hospital gown. Pt appreciative.

## 2021-07-21 NOTE — Progress Notes (Addendum)
PROGRESS NOTE    Stephanie [name]  VZD:[id number] DOB: [birth year] DOA: 07/20/2021 PCP: [name], MD  Outpatient Specialists: [facility] cardiology and endocrinology    Brief Narrative:   Hx hfpef, last hospitalized for such at [facility] 11/2020, here with at least several weeks progressively worsening dyspnea on exertion, found to have decompensated heart failure   Assessment & Plan:   Principal Problem:   Acute on chronic systolic (congestive) heart failure ([place]) Active Problems:   SOB (shortness of breath)   Acute respiratory failure with hypoxia ([place])   Diabetes mellitus without complication ([place])   Hypothyroid   Parkinson's disease ([place])   Hyperlipidemia  # HFpEF # Acute hypoxic respiratory failure # pulmonary hypertension EF 1/22 at [facility] 55%, moderate pulmonary hypertension. Here bnp elevated to 900s, lower extremity edema, o2 in mid 80s improved to normal on 2 L. Has intermittent need for o2 at home, usually at night. Has received lasix here w/ symptomatic improvement. Initial troponin neg, no chest pain. O2 this morning off o2 is 91. Likely underlying osa. Dry weight per daughter 24; 195 here. Dimer is elevated - repeat trop - f/u lower extremity PVLs to assess for dvt. If neg, consider CTA, though CHF is likely culprit - strict I/os and daily weights - cont lasix 60 iv bid (home 40 oral qd, increased recently to 80 oral - f/u TTE - monitor o2, oxygen as needed - cont home metop - pt consult - admitting physician consulted cardiology, recs pending  ADDENDUM 9/20 14:30 BPs soft, will hold antihypertensives and further diuretics  # HTN Here bp wnl - diuresis as above - home metop, enalapril, statin  # Parkinsons - home sinimet, amantadine  # Gout - home allopurinol  # T2DM Here glucose wnl - hold home pio and met - SSI  # Hypothyroid - home synthroid     DVT prophylaxis: lovenox Code Status: full Family Communication: daughter updated  telephonically 9/20  Level of care: Med-Surg Status is: Inpatient  Remains inpatient appropriate because:Inpatient level of care appropriate due to severity of illness  Dispo: The patient is from: Home              Anticipated d/c is to: Home              Patient currently is not medically stable to d/c.   Difficult to place patient No   Consultants:  none  Procedures: none  Antimicrobials:  none    Subjective: This morning some improvement in sob. No chest pain or cough.  Objective: Vitals:   07/20/21 1759 07/20/21 1800 07/21/21 0237 07/21/21 0527  BP: 140/74  (!) 145/77 104/86  Pulse: 82  91 75  Resp: (!) _0 Temp: 98.3 F (36.8 C)     TempSrc: Oral     SpO2: 96%  96% 92%  Weight:  88.5 kg    Height:  _1  (1.549 m)     No intake or output data in the 24 hours ending 07/21/21 0759 Filed Weights   07/20/21 1800  Weight: 88.5 kg    Examination:  General exam: Appears calm and comfortable  Respiratory system: exp wheeze, rales at bases Cardiovascular system: S1 & S2 heard, RRR. No JVD, murmurs, rubs, gallops or clicks. Mod pedal edema Gastrointestinal system: Abdomen is obese, soft and nontender. No organomegaly or masses felt. Normal bowel sounds heard. Central nervous system: Alert and oriented. No focal neurological deficits. Extremities: Symmetric 5 x 5 power. Skin:  No rashes, lesions or ulcers Psychiatry: Judgement and insight appear normal. Mood & affect appropriate.     Data Reviewed: I have personally reviewed following labs and imaging studies  CBC: Recent Labs  Lab 07/20/21 1824 07/21/21 0737  WBC 6.6 6.5  NEUTROABS 4.6  --   HGB 12.0 11.3*  HCT 38.9 38.0  MCV 83.3 83.7  PLT 270 063   Basic Metabolic Panel: Recent Labs  Lab 07/20/21 1824  NA 138  K 3.6  CL 100  CO2 31  GLUCOSE 212*  BUN 25*  CREATININE 0.82  CALCIUM 8.4*  MG 1.5*   GFR: Estimated Creatinine Clearance: 60.9 mL/min (by C-G formula based on SCr of  0.82 mg/dL). Liver Function Tests: Recent Labs  Lab 07/20/21 1824  AST 22  ALT 7  ALKPHOS 103  BILITOT 1.3*  PROT 7.1  ALBUMIN 3.2*   No results for input(s): LIPASE, AMYLASE in the last 168 hours. No results for input(s): AMMONIA in the last 168 hours. Coagulation Profile: Recent Labs  Lab 07/21/21 0737  INR 1.2   Cardiac Enzymes: No results for input(s): CKTOTAL, CKMB, CKMBINDEX, TROPONINI in the last 168 hours. BNP (last 3 results) No results for input(s): PROBNP in the last 8760 hours. HbA1C: No results for input(s): HGBA1C in the last 72 hours. CBG: Recent Labs  Lab 07/21/21 0534 07/21/21 0727  GLUCAP 112* 122*   Lipid Profile: No results for input(s): CHOL, HDL, LDLCALC, TRIG, CHOLHDL, LDLDIRECT in the last 72 hours. Thyroid Function Tests: No results for input(s): TSH, T4TOTAL, FREET4, T3FREE, THYROIDAB in the last 72 hours. Anemia Panel: No results for input(s): VITAMINB12, FOLATE, FERRITIN, TIBC, IRON, RETICCTPCT in the last 72 hours. Urine analysis:    Component Value Date/Time   COLORURINE YELLOW 12/05/2020 1828   APPEARANCEUR CLEAR 12/05/2020 1828   LABSPEC 1.010 12/05/2020 1828   PHURINE 7.5 12/05/2020 1828   GLUCOSEU NEGATIVE 12/05/2020 1828   HGBUR NEGATIVE 12/05/2020 1828   BILIRUBINUR NEGATIVE 12/05/2020 1828   KETONESUR NEGATIVE 12/05/2020 1828   PROTEINUR NEGATIVE 12/05/2020 1828   NITRITE NEGATIVE 12/05/2020 1828   LEUKOCYTESUR NEGATIVE 12/05/2020 1828   Sepsis Labs: _0 (procalcitonin:4,lacticidven:4)  ) Recent Results (from the past 240 hour(s))  Resp Panel by RT-PCR (Flu A&B, Covid) Nasopharyngeal Swab     Status: None   Collection Time: 07/20/21  9:35 PM   Specimen: Nasopharyngeal Swab; Nasopharyngeal(NP) swabs in vial transport medium  Result Value Ref Range Status   SARS Coronavirus 2 by RT PCR NEGATIVE NEGATIVE Final    Comment: (NOTE) SARS-CoV-2 target nucleic acids are NOT DETECTED.  The SARS-CoV-2 RNA is generally  detectable in upper respiratory specimens during the acute phase of infection. The lowest concentration of SARS-CoV-2 viral copies this assay can detect is 138 copies/mL. A negative result does not preclude SARS-Cov-2 infection and should not be used as the sole basis for treatment or other patient management decisions. A negative result may occur with  improper specimen collection/handling, submission of specimen other than nasopharyngeal swab, presence of viral mutation(s) within the areas targeted by this assay, and inadequate number of viral copies(<138 copies/mL). A negative result must be combined with clinical observations, patient history, and epidemiological information. The expected result is Negative.  Fact Sheet for Patients:  EntrepreneurPulse.com.au  Fact Sheet for Healthcare Providers:  IncredibleEmployment.be  This test is no t yet approved or cleared by the [place] FDA and  has been authorized for detection and/or diagnosis of SARS-CoV-2 by FDA under an Emergency Use  Authorization (EUA). This EUA will remain  in effect (meaning this test can be used) for the duration of the COVID-19 declaration under Section 564(b)(1) of the Act, 21 U.S.C.section 360bbb-3(b)(1), unless the authorization is terminated  or revoked sooner.       Influenza A by PCR NEGATIVE NEGATIVE Final   Influenza B by PCR NEGATIVE NEGATIVE Final    Comment: (NOTE) The Xpert Xpress SARS-CoV-2/FLU/RSV plus assay is intended as an aid in the diagnosis of influenza from Nasopharyngeal swab specimens and should not be used as a sole basis for treatment. Nasal washings and aspirates are unacceptable for Xpert Xpress SARS-CoV-2/FLU/RSV testing.  Fact Sheet for Patients: EntrepreneurPulse.com.au  Fact Sheet for Healthcare Providers: IncredibleEmployment.be  This test is not yet approved or cleared by the [place] FDA  and has been authorized for detection and/or diagnosis of SARS-CoV-2 by FDA under an Emergency Use Authorization (EUA). This EUA will remain in effect (meaning this test can be used) for the duration of the COVID-19 declaration under Section 564(b)(1) of the Act, 21 U.S.C. section 360bbb-3(b)(1), unless the authorization is terminated or revoked.  Performed at [facility], [address] [place], [place] [address]          Radiology Studies: DG Chest 2 View  Result Date: 07/20/2021 CLINICAL DATA:  Shortness of breath. EXAM: CHEST - 2 VIEW COMPARISON:  July 06, 2021 FINDINGS: Mild, chronic appearing diffusely increased interstitial lung markings are seen. Mild, stable areas of linear scarring and/or atelectasis are noted within the bilateral lung bases. There is no evidence of a pleural effusion or pneumothorax. The cardiac silhouette is mildly enlarged and unchanged in size. Degenerative changes seen throughout the thoracic spine. IMPRESSION: 1. Stable cardiomegaly with mild, stable bibasilar linear scarring and/or atelectasis. Electronically Signed   By: Virgina Norfolk M.D.   On: 07/20/2021 19:08        Scheduled Meds:  allopurinol  100 mg Oral Daily   amantadine  100 mg Oral BID   carbidopa-levodopa  2.5 tablet Oral QID   docusate sodium  100 mg Oral BID   enalapril  20 mg Oral BID   enoxaparin (LOVENOX) injection  40 mg Subcutaneous QHS   ferrous sulfate  325 mg Oral Q breakfast   furosemide  40 mg Intravenous BID   gabapentin  300 mg Oral QHS   insulin aspart  0-9 Units Subcutaneous TID WC   levothyroxine  25 mcg Oral Q0600   metoprolol succinate  50 mg Oral Q breakfast   pravastatin  40 mg Oral q1800   Continuous Infusions:   LOS: 1 day    Time spent: [place], MD [facility]   If 7PM-7AM, please contact night-coverage [email or web address] Password [email or web address] 07/21/2021, 7:59 AM

## 2021-07-21 NOTE — ED Notes (Signed)
Legs elevated on pillows

## 2021-07-21 NOTE — Consult Note (Signed)
Heart Failure Nurse Navigator Note  HFmrEF 40 to 45%.  She presented to the emergency room from home where she resides with her daughter after not responding to increase in outpatient diuretic.  Due to shortness of breath, orthopnea and bilateral lower extremity edema.  Comorbidities:  Hypertension Hyperlipidemia Type 2 diabetes Chronic anemia iron deficiency  Medications:  Pravastatin Metoprolol succinate 50 mg every morning Lasix infusion  Labs:  BNP 960, sodium 142, potassium 3.6, chloride 97, CO2 36, BUN 18, creatinine 0.71 albumin 2.7, AST 21, ALT 5    Initial meeting with patient today.  She states she lives with her daughter.  She states that she uses a rolling walker.  She also goes on to talk about falling a few weeks ago with the old walker that got away from her.  She states that she does not weigh her self on a daily basis that she had in the past but she is got away from that habit, stressed the importance of daily weight and what to report.  She states that her daughter fixes her meals and that she does not use salt at the table.  Also discussed fluid restriction of 64 ounces in a days time.  She was given the living with heart failure teaching booklet along with the heart failure packet  She has outpatient heart failure clinic appointment with Darylene Price on September 29 at 3 PM.  He was also given written information about this.  Pricilla Riffle RN [facility]

## 2021-07-22 ENCOUNTER — Inpatient Hospital Stay: Payer: Medicare Other

## 2021-07-22 DIAGNOSIS — I5023 Acute on chronic systolic (congestive) heart failure: Secondary | ICD-10-CM | POA: Diagnosis not present

## 2021-07-22 LAB — ECHOCARDIOGRAM COMPLETE
Area-P 1/2: 4.31 cm2
Height: 61 in
P 1/2 time: 522 msec
S' Lateral: 4.7 cm
Weight: 3120 oz

## 2021-07-22 LAB — CBG MONITORING, ED
Glucose-Capillary: 180 mg/dL — ABNORMAL HIGH (ref 70–99)
Glucose-Capillary: 146 mg/dL — ABNORMAL HIGH (ref 70–99)
Glucose-Capillary: 277 mg/dL — ABNORMAL HIGH (ref 70–99)
Glucose-Capillary: 177 mg/dL — ABNORMAL HIGH (ref 70–99)

## 2021-07-22 LAB — BASIC METABOLIC PANEL
Anion gap: 9 (ref 5–15)
BUN: 16 mg/dL (ref 8–23)
CO2: 33 mmol/L — ABNORMAL HIGH (ref 22–32)
Calcium: 8.2 mg/dL — ABNORMAL LOW (ref 8.9–10.3)
Chloride: 94 mmol/L — ABNORMAL LOW (ref 98–111)
Creatinine, Ser: 0.59 mg/dL (ref 0.44–1.00)
GFR, Estimated: >60 mL/min (ref >60)
Glucose, Bld: 125 mg/dL — ABNORMAL HIGH (ref 70–99)
Potassium: 3.8 mmol/L (ref 3.5–5.1)
Sodium: 136 mmol/L (ref 135–145)

## 2021-07-22 LAB — MAGNESIUM: Magnesium: 2.1 mg/dL (ref 1.7–2.4)

## 2021-07-22 MED ORDER — IOHEXOL 350 MG/ML SOLN
75.0000 mL | Freq: Once | INTRAVENOUS | Status: AC | PRN
  Administered 2021-07-22: 75 mL via INTRAVENOUS
  Filled 2021-07-22: qty 75

## 2021-07-22 MED ORDER — FUROSEMIDE 10 MG/ML IJ SOLN
40.0000 mg | Freq: Two times a day (BID) | INTRAMUSCULAR | Status: DC
  Administered 2021-07-22 – 2021-07-23 (×4): 40 mg via INTRAVENOUS
  Filled 2021-07-22 (×4): qty 4

## 2021-07-22 NOTE — Progress Notes (Signed)
PROGRESS NOTE    SHERIKA KUBICKI  DSK:[id number] DOB: 1946/03/31 DOA: 07/20/2021 PCP: [name], MD    Brief Narrative:   Hx hfpef, [facility] hospitalized for such at [facility] 11/[facility], here with at least several weeks progressively worsening dyspnea on exertion, found to have decompensated heart failure  Assessment & Plan:   Principal Problem:   Acute on chronic systolic (congestive) heart failure ([place]) Active Problems:   SOB (shortness of breath)   Acute respiratory failure with hypoxia ([place])   Diabetes mellitus without complication ([place])   Hypothyroid   Parkinson's disease ([place])   Hyperlipidemia  HFpEF Acute hypoxic respiratory failure pulmonary hypertension EF 1/22 at [facility] 55%, moderate pulmonary hypertension.  Here bnp elevated to 900s, lower extremity edema, o2 in mid 80s improved to normal on 2 L.  Has intermittent need for o2 at home, usually at night.  Has received lasix here w/ symptomatic improvement.  Initial troponin neg, no chest pain.  Likely underlying osa.  Dry weight per daughter 69; 195 here. Dimer is elevated Plan: CT angio rule out PE IV Lasix 40 mg twice daily.  Titrate as necessary Follow-up TTE Cardiology follow-up Therapy evaluations  Possible esophageal foreign body Unclear etiology, noted on CT angio We will pursue SLP evaluation and esophagram Possible GI consult   HTN Here bp wnl - diuresis as above - home metop, enalapril, statin   Parkinsons - home sinimet, amantadine   Gout - home allopurinol   T2DM Here glucose wnl - hold home pio and met - SSI   Hypothyroid - home synthroid   DVT prophylaxis: SQ Lovenox Code Status: Full Family Communication: None today Disposition Plan: Status is: Inpatient  Remains inpatient appropriate because:Inpatient level of care appropriate due to severity of illness  Dispo: The patient is from: Home              Anticipated d/c is to: Home              Patient currently is not  medically stable to d/c.   Difficult to place patient No       Level of care: Med-Surg  Consultants:  Cardiology  Procedures:  None  Antimicrobials:  None   Subjective: Seen and examined.  Continues to endorse shortness of breath.  No pain complaints.  Objective: Vitals:   07/22/21 0800 07/22/21 0900 07/22/21 1340 07/22/21 1500  BP: (!) 149/101 (!) 149/128 (!) 161/93 (!) 151/87  Pulse: 91 81 64 79  Resp: 13 14 (!) 21 20  Temp:      TempSrc:      SpO2: 93% 97% 94% 98%  Weight:      Height:       No intake or output data in the 24 hours ending 07/22/21 1516 Filed Weights   07/20/21 1800  Weight: 88.5 kg    Examination:  General exam: No acute distress Respiratory system: Bilateral crackles.  Normal work of breathing.  2 L Cardiovascular system: S1-S2, RRR, no murmurs, no pedal edema Gastrointestinal system: Soft, nontender, nondistended, normal bowel sounds Central nervous system: Alert and oriented. No focal neurological deficits. Extremities: Symmetric 5 x 5 power. Skin: No rashes, lesions or ulcers Psychiatry: Judgement and insight appear normal. Mood & affect appropriate.     Data Reviewed: I have personally reviewed following labs and imaging studies  CBC: Recent Labs  Lab 07/20/21 1824 07/21/21 0737  WBC 6.6 6.5  NEUTROABS 4.6  --   HGB 12.0 11.3*  HCT 38.9 38.0  MCV 83.3 83.7  PLT 270 638   Basic Metabolic Panel: Recent Labs  Lab 07/20/21 1824 07/21/21 0737 07/22/21 0610  NA 138 142 136  K 3.6 3.6 3.8  CL 100 97* 94*  CO2 31 36* 33*  GLUCOSE 212* 134* 125*  BUN 25* 18 16  CREATININE 0.82 0.71 0.59  CALCIUM 8.4* 8.3* 8.2*  MG 1.5* 1.5* 2.1  PHOS  --  3.2  --    GFR: Estimated Creatinine Clearance: 62.4 mL/min (by C-G formula based on SCr of 0.59 mg/dL). Liver Function Tests: Recent Labs  Lab 07/20/21 1824 07/21/21 0737  AST 22 21  ALT 7 5  ALKPHOS 103 97  BILITOT 1.3* 1.3*  PROT 7.1 7.0  ALBUMIN 3.2* 2.7*   No  results for input(s): LIPASE, AMYLASE in the last 168 hours. No results for input(s): AMMONIA in the last 168 hours. Coagulation Profile: Recent Labs  Lab 07/21/21 0737  INR 1.2   Cardiac Enzymes: No results for input(s): CKTOTAL, CKMB, CKMBINDEX, TROPONINI in the last 168 hours. BNP (last 3 results) No results for input(s): PROBNP in the last 8760 hours. HbA1C: No results for input(s): HGBA1C in the last 72 hours. CBG: Recent Labs  Lab 07/21/21 1133 07/21/21 1637 07/22/21 0113 07/22/21 0822 07/22/21 1158  GLUCAP 204* 159* 180* 146* 277*   Lipid Profile: No results for input(s): CHOL, HDL, LDLCALC, TRIG, CHOLHDL, LDLDIRECT in the last 72 hours. Thyroid Function Tests: No results for input(s): TSH, T4TOTAL, FREET4, T3FREE, THYROIDAB in the last 72 hours. Anemia Panel: No results for input(s): VITAMINB12, FOLATE, FERRITIN, TIBC, IRON, RETICCTPCT in the last 72 hours. Sepsis Labs: Recent Labs  Lab 07/20/21 1824  PROCALCITON <0.10    Recent Results (from the past 240 hour(s))  Resp Panel by RT-PCR (Flu A&B, Covid) Nasopharyngeal Swab     Status: None   Collection Time: 07/20/21  9:35 PM   Specimen: Nasopharyngeal Swab; Nasopharyngeal(NP) swabs in vial transport medium  Result Value Ref Range Status   SARS Coronavirus 2 by RT PCR NEGATIVE NEGATIVE Final    Comment: (NOTE) SARS-CoV-2 target nucleic acids are NOT DETECTED.  The SARS-CoV-2 RNA is generally detectable in upper respiratory specimens during the acute phase of infection. The lowest concentration of SARS-CoV-2 viral copies this assay can detect is 138 copies/mL. A negative result does not preclude SARS-Cov-2 infection and should not be used as the sole basis for treatment or other patient management decisions. A negative result may occur with  improper specimen collection/handling, submission of specimen other than nasopharyngeal swab, presence of viral mutation(s) within the areas targeted by this assay, and  inadequate number of viral copies(<138 copies/mL). A negative result must be combined with clinical observations, patient history, and epidemiological information. The expected result is Negative.  Fact Sheet for Patients:  EntrepreneurPulse.com.au  Fact Sheet for Healthcare Providers:  IncredibleEmployment.be  This test is no t yet approved or cleared by the [place] FDA and  has been authorized for detection and/or diagnosis of SARS-CoV-2 by FDA under an Emergency Use Authorization (EUA). This EUA will remain  in effect (meaning this test can be used) for the duration of the COVID-19 declaration under Section 564(b)(1) of the Act, 21 U.S.C.section 360bbb-3(b)(1), unless the authorization is terminated  or revoked sooner.       Influenza A by PCR NEGATIVE NEGATIVE Final   Influenza B by PCR NEGATIVE NEGATIVE Final    Comment: (NOTE) The Xpert Xpress SARS-CoV-2/FLU/RSV plus assay is intended as an aid  in the diagnosis of influenza from Nasopharyngeal swab specimens and should not be used as a sole basis for treatment. Nasal washings and aspirates are unacceptable for Xpert Xpress SARS-CoV-2/FLU/RSV testing.  Fact Sheet for Patients: EntrepreneurPulse.com.au  Fact Sheet for Healthcare Providers: IncredibleEmployment.be  This test is not yet approved or cleared by the [place] FDA and has been authorized for detection and/or diagnosis of SARS-CoV-2 by FDA under an Emergency Use Authorization (EUA). This EUA will remain in effect (meaning this test can be used) for the duration of the COVID-19 declaration under Section 564(b)(1) of the Act, 21 U.S.C. section 360bbb-3(b)(1), unless the authorization is terminated or revoked.  Performed at [facility], [address], [place], [place] [address]          Radiology Studies: DG Chest 2 View  Result Date: 07/20/2021 CLINICAL DATA:   Shortness of breath. EXAM: CHEST - 2 VIEW COMPARISON:  July 06, 2021 FINDINGS: Mild, chronic appearing diffusely increased interstitial lung markings are seen. Mild, stable areas of linear scarring and/or atelectasis are noted within the bilateral lung bases. There is no evidence of a pleural effusion or pneumothorax. The cardiac silhouette is mildly enlarged and unchanged in size. Degenerative changes seen throughout the thoracic spine. IMPRESSION: 1. Stable cardiomegaly with mild, stable bibasilar linear scarring and/or atelectasis. Electronically Signed   By: Virgina Norfolk M.D.   On: 07/20/2021 19:08   CT Angio Chest Pulmonary Embolism (PE) W or WO Contrast  Result Date: 07/22/2021 CLINICAL DATA:  Acute shortness of breath, swelling, fluid retention, acute on chronic systolic heart failure EXAM: CT ANGIOGRAPHY CHEST WITH CONTRAST TECHNIQUE: Multidetector CT imaging of the chest was performed using the standard protocol during bolus administration of intravenous contrast. Multiplanar CT image reconstructions and MIPs were obtained to evaluate the vascular anatomy. CONTRAST:  75m OMNIPAQUE IOHEXOL 350 MG/ML SOLN COMPARISON:  12/31/2015 FINDINGS: Cardiovascular: Pulmonary arteries appear patent and normal in caliber. No acute filling defect or pulmonary embolus demonstrated by CTA. Atherosclerotic changes of the aorta. Patent 3 vessel arch anatomy. Negative for aneurysm or acute dissection. No mediastinal hemorrhage or hematoma. Native coronary atherosclerosis noted. Marked cardiomegaly. No pericardial effusion. Central venous structures are patent.  No veno-occlusive process. Mediastinum/Nodes: Limited visualization of the thyroid. Trachea and central airways are patent. Upper thoracic esophagus intraluminal soft tissue content and air noted, images 62 through 91/5. This may represent ingested food, but difficult to exclude underlying esophageal mural lesion or abnormality. Consider follow-up  esophageal evaluation such as esophagram. No adenopathy. Lungs/Pleura: Diffuse interlobular septal thickening and patchy ground-glass attenuation, compatible with mild edema. Scattered areas of bandlike opacity throughout the right lung as well as the lingula and left lower lobe favored to be atelectasis. Difficult to exclude early pneumonia. No pleural abnormality, significant effusion, or pneumothorax. Upper Abdomen: Limited assessment. Dilated hepatic IVC when compared to the prior studies suggesting component of right heart failure. No acute upper abdominal finding. Musculoskeletal: Lower chest subcutaneous edema compatible with anasarca. Degenerative changes throughout the spine. No acute osseous finding. No compression fracture. Sternum intact. Review of the MIP images confirms the above findings. IMPRESSION: Negative for significant acute pulmonary embolus by CTA. Cardiomegaly with mild interstitial pulmonary edema pattern compatible with early CHF. Scattered areas of bilateral bandlike opacities throughout both lungs favored to be atelectasis. Difficult to exclude early pneumonia. Upper thoracic esophagus intraluminal soft tissue content and air may represent ingested impacted food. Underlying esophageal lesion not excluded. Recommend follow-up esophageal evaluation and or esophagram. Lower thoracic anasarca  throughout the soft tissues. Aortic Atherosclerosis (ICD10-I70.0). Electronically Signed   By: Jerilynn Mages.  Shick M.D.   On: 07/22/2021 10:45   US Venous Img Lower Bilateral (DVT)  Result Date: 07/21/2021 CLINICAL DATA:  Positive D-dimer Pain Swelling Fall 2-3 weeks ago Color changes EXAM: BILATERAL LOWER EXTREMITY VENOUS DOPPLER ULTRASOUND TECHNIQUE: Gray-scale sonography with compression, as well as color and duplex ultrasound, were performed to evaluate the deep venous system(s) from the level of the common femoral vein through the popliteal and proximal calf veins. COMPARISON:  None. FINDINGS: VENOUS  Normal compressibility of the common femoral, superficial femoral, and popliteal veins, as well as the visualized calf veins. Visualized portions of profunda femoral vein and great saphenous vein unremarkable. No filling defects to suggest DVT on grayscale or color Doppler imaging. Doppler waveforms show normal direction of venous flow, normal respiratory plasticity and response to augmentation. OTHER None. Limitations: Calf veins not well visualized due to edema. IMPRESSION: No lower extremity DVT. Limited assessment of the calf veins due to edema. Electronically Signed   By: Miachel Roux M.D.   On: 07/21/2021 14:55   ECHOCARDIOGRAM COMPLETE  Result Date: 07/22/2021    ECHOCARDIOGRAM REPORT   Patient Name:   ABRIELLA FILKINS [facility] Date of Exam: 07/21/2021 Medical Rec #:  [id number]          Height:       61.0 in Accession #:    [id number]         Weight:       195.0 lb Date of Birth:  05/16/1946         BSA:          1.869 m Patient Age:    75 years           BP:           103/78 mmHg Patient Gender: F                  HR:           66 bpm. Exam Location:  [facility] Procedure: 2D Echo, Cardiac Doppler and Color Doppler Indications:     O59.29 Acute Systolic CHF  History:         Patient has no prior history of Echocardiogram examinations.                  Risk Factors:Diabetes, Dyslipidemia and Hypertension. Sleep                  apnea. Parkinson's Disease. Dysrhythmia. Hypothyroidism.                  Syncope.  Sonographer:     Cresenciano Lick RDCS Referring Phys:  [id number] Rhetta Mura Diagnosing Phys: Yolonda Kida MD IMPRESSIONS  1. Left ventricular ejection fraction, by estimation, is 25 to 30%. The left ventricle has severely decreased function. The left ventricle demonstrates global hypokinesis. The left ventricular internal cavity size was moderately dilated. Left ventricular diastolic parameters were normal.  2. Right ventricular systolic function is mildly reduced. The right ventricular size is  moderately enlarged.  3. Left atrial size was moderately dilated.  4. Right atrial size was mild to moderately dilated.  5. The mitral valve is grossly normal. Severe mitral valve regurgitation.  6. Tricuspid valve regurgitation is severe.  7. The aortic valve is grossly normal. Aortic valve regurgitation is mild. Mild aortic valve sclerosis is present, with no evidence of aortic valve stenosis. FINDINGS  Left Ventricle: Left ventricular  ejection fraction, by estimation, is 25 to 30%. The left ventricle has severely decreased function. The left ventricle demonstrates global hypokinesis. The left ventricular internal cavity size was moderately dilated. There is no left ventricular hypertrophy. Left ventricular diastolic parameters were normal. Right Ventricle: The right ventricular size is moderately enlarged. No increase in right ventricular wall thickness. Right ventricular systolic function is mildly reduced. Left Atrium: Left atrial size was moderately dilated. Right Atrium: Right atrial size was mild to moderately dilated. Pericardium: There is no evidence of pericardial effusion. Mitral Valve: The mitral valve is grossly normal. There is moderate thickening of the mitral valve leaflet(s). Severe mitral valve regurgitation. Tricuspid Valve: The tricuspid valve is grossly normal. Tricuspid valve regurgitation is severe. Aortic Valve: The aortic valve is grossly normal. Aortic valve regurgitation is mild. Aortic regurgitation PHT measures 522 msec. Mild aortic valve sclerosis is present, with no evidence of aortic valve stenosis. Pulmonic Valve: The pulmonic valve was normal in structure. Pulmonic valve regurgitation is trivial. Aorta: The ascending aorta was not well visualized. IAS/Shunts: No atrial level shunt detected by color flow Doppler.  LEFT VENTRICLE PLAX 2D LVIDd:         5.40 cm  Diastology LVIDs:         4.70 cm  LV e' medial:    4.03 cm/s LV PW:         0.90 cm  LV E/e' medial:  24.4 LV IVS:         0.70 cm  LV e' lateral:   10.60 cm/s LVOT diam:     1.70 cm  LV E/e' lateral: 9.3 LV SV:         31 LV SV Index:   17 LVOT Area:     2.27 cm  RIGHT VENTRICLE            IVC RV Basal diam:  3.50 cm    IVC diam: 2.30 cm RV S prime:     9.03 cm/s TAPSE (M-mode): 1.2 cm LEFT ATRIUM             Index       RIGHT ATRIUM           Index LA diam:        5.30 cm 2.84 cm/m  RA Area:     18.40 cm LA Vol (A2C):   93.4 ml 49.99 ml/m RA Volume:   53.40 ml  28.58 ml/m LA Vol (A4C):   81.6 ml 43.67 ml/m LA Biplane Vol: 87.6 ml 46.88 ml/m  AORTIC VALVE LVOT Vmax:   64.20 cm/s LVOT Vmean:  51.200 cm/s LVOT VTI:    0.136 m AI PHT:      522 msec  AORTA Ao Root diam: 2.50 cm Ao Asc diam:  3.40 cm MITRAL VALVE               TRICUSPID VALVE MV Area (PHT): 4.31 cm    TR Peak grad:   38.9 mmHg MV Decel Time: 176 msec    TR Vmax:        312.00 cm/s MV E velocity: 98.50 cm/s MV A velocity: 91.30 cm/s  SHUNTS MV E/A ratio:  1.08        Systemic VTI:  0.14 m                            Systemic Diam: 1.70 cm Yolonda Kida MD Electronically signed by Yolonda Kida MD  Signature Date/Time: 07/22/2021/12:58:29 PM    Final         Scheduled Meds:  allopurinol  100 mg Oral Daily   amantadine  100 mg Oral BID   carbidopa-levodopa  1 tablet Oral QHS   carbidopa-levodopa  2.5 tablet Oral QID   docusate sodium  100 mg Oral BID   enoxaparin (LOVENOX) injection  40 mg Subcutaneous QHS   ferrous sulfate  325 mg Oral Q breakfast   furosemide  40 mg Intravenous Q12H   gabapentin  300 mg Oral QHS   insulin aspart  0-9 Units Subcutaneous TID WC   levothyroxine  75 mcg Oral Q0600   metoprolol succinate  50 mg Oral Q breakfast   pravastatin  40 mg Oral q1800   Continuous Infusions:   LOS: 2 days    Time spent: 25 minutes    Sidney Ace, MD [facility] Pager [phone]  If 7PM-7AM, please contact night-coverage [email or web address] Password [facility] 07/22/2021, 3:16 PM

## 2021-07-22 NOTE — Progress Notes (Signed)
[facility] Cardiology  Patient Description: Stephanie [name] is a 75 year old female patient with PMH significant for chronic systolic CHF (most recent EF = 40-45%), LBBB, hypertension, hyperlipidemia, diabetes mellitus type 2, chronic iron deficiency anemia, Parkinson's disease and obesity who presented to the ER due to worsening shortness of breath and found to be in CHF exacerbation, thus Cardiology was consulted.   SUBJECTIVE: Today the patient reports to be feeling slightly better since yesterday. She reports that she continues to have some dyspnea, but states that it has improved. The patient continues to deny having any chest pain, dizziness or palpitations at this time.   OBJECTIVE: The patient appears stable on today. Her effort of breathing is normal; however, her breath sounds continues to be diminished and she continues to be on supplemental oxygen at 2L via [place] with normal oxygen saturation. Bedside telemonitor reveals LBBB. Patient continues to appear hypervolemic with BLE edema with compression wraps in place. Her urine output is also decreased and the urine is dark in color. There are concerns over the accuracy of the I/O's. The patient is resting comfortably in stretch with rails up and is in NAD.   Vitals:   07/22/21 0140 07/22/21 0300 07/22/21 0600 07/22/21 0700  BP:  (!) 165/109 (!) 147/85 104/85  Pulse: 74 76 69 77  Resp: 19 (!) 21 19 (!) 21  Temp:  97.6 F (36.4 C)    TempSrc:  Oral    SpO2: 95% 97% 100% 95%  Weight:      Height:         Intake/Output Summary (Last 24 hours) at 07/22/2021 0800 Last data filed at 07/21/2021 1314 Gross per 24 hour  Intake 106 ml  Output 250 ml  Net -144 ml      PHYSICAL EXAM  General: Well developed, well nourished, in no acute distress HEENT:  Normocephalic and atramatic, facial bruising noted to the right of the face Neck:   No JVD.  Lungs: Clear bilaterally to auscultation. Chest expansion symmetrical, no wheezes, rales or  rhonchi Heart: HRRR . Normal S1 and S2 without gallops or murmurs.  Abdomen: Bowel sounds are positive, abdomen soft and non-tender  Msk:  Normal strength and tone for age. Extremities: moderate, 2 pitting edema noted to the BLE with erythema. No clubbing or cyanosis  Neuro: Alert and oriented X 3. Psych:  Good affect, responds appropriately   LABS: Basic Metabolic Panel: Recent Labs    07/21/21 0737 07/22/21 0610  NA 142 136  K 3.6 3.8  CL 97* 94*  CO2 36* 33*  GLUCOSE 134* 125*  BUN 18 16  CREATININE 0.71 0.59  CALCIUM 8.3* 8.2*  MG 1.5* 2.1  PHOS 3.2  --    Liver Function Tests: Recent Labs    07/20/21 1824 07/21/21 0737  AST 22 21  ALT 7 5  ALKPHOS 103 97  BILITOT 1.3* 1.3*  PROT 7.1 7.0  ALBUMIN 3.2* 2.7*   No results for input(s): LIPASE, AMYLASE in the last 72 hours. CBC: Recent Labs    07/20/21 1824 07/21/21 0737  WBC 6.6 6.5  NEUTROABS 4.6  --   HGB 12.0 11.3*  HCT 38.9 38.0  MCV 83.3 83.7  PLT 270 260   Cardiac Enzymes: No results for input(s): CKTOTAL, CKMB, CKMBINDEX, TROPONINI in the last 72 hours. BNP: Invalid input(s): POCBNP D-Dimer: Recent Labs    07/21/21 0737  DDIMER 1.69*   Hemoglobin A1C: No results for input(s): HGBA1C in the last 72 hours. Fasting Lipid  Panel: No results for input(s): CHOL, HDL, LDLCALC, TRIG, CHOLHDL, LDLDIRECT in the last 72 hours. Thyroid Function Tests: No results for input(s): TSH, T4TOTAL, T3FREE, THYROIDAB in the last 72 hours.  Invalid input(s): FREET3 Anemia Panel: No results for input(s): VITAMINB12, FOLATE, FERRITIN, TIBC, IRON, RETICCTPCT in the last 72 hours.  DG Chest 2 View  Result Date: 07/20/2021 CLINICAL DATA:  Shortness of breath. EXAM: CHEST - 2 VIEW COMPARISON:  July 06, 2021 FINDINGS: Mild, chronic appearing diffusely increased interstitial lung markings are seen. Mild, stable areas of linear scarring and/or atelectasis are noted within the bilateral lung bases. There is no  evidence of a pleural effusion or pneumothorax. The cardiac silhouette is mildly enlarged and unchanged in size. Degenerative changes seen throughout the thoracic spine. IMPRESSION: 1. Stable cardiomegaly with mild, stable bibasilar linear scarring and/or atelectasis. Electronically Signed   By: Virgina Norfolk M.D.   On: 07/20/2021 19:08   US Venous Img Lower Bilateral (DVT)  Result Date: 07/21/2021 CLINICAL DATA:  Positive D-dimer Pain Swelling Fall 2-3 weeks ago Color changes EXAM: BILATERAL LOWER EXTREMITY VENOUS DOPPLER ULTRASOUND TECHNIQUE: Gray-scale sonography with compression, as well as color and duplex ultrasound, were performed to evaluate the deep venous system(s) from the level of the common femoral vein through the popliteal and proximal calf veins. COMPARISON:  None. FINDINGS: VENOUS Normal compressibility of the common femoral, superficial femoral, and popliteal veins, as well as the visualized calf veins. Visualized portions of profunda femoral vein and great saphenous vein unremarkable. No filling defects to suggest DVT on grayscale or color Doppler imaging. Doppler waveforms show normal direction of venous flow, normal respiratory plasticity and response to augmentation. OTHER None. Limitations: Calf veins not well visualized due to edema. IMPRESSION: No lower extremity DVT. Limited assessment of the calf veins due to edema. Electronically Signed   By: Miachel Roux M.D.   On: 07/21/2021 14:55     Echo: results pending   TELEMETRY: LBBB  ASSESSMENT AND PLAN:  Principal Problem:   Acute on chronic systolic (congestive) heart failure (HCC) Active Problems:   SOB (shortness of breath)   Acute respiratory failure with hypoxia ([facility])   Diabetes mellitus without complication ([facility])   Hypothyroid   Parkinson's disease ([facility])   Hyperlipidemia    CHF exacerbation, reasonably stable at this time, patient appears hypervolemic at this time with dyspnea and BLE edema, CXR remarkable for  stable cardiomegaly Continuous cardiac telemetry monitoring until discharge. Echo results pending.  Continue diuresis with IV Lasix.  Consider transitioning to oral torsemide therapy once patient is diuresed and stable.  Continue ACEi and beta-blocker therapy. Monitor strict I's and O's. Consider UA and Urine culture due to discoloration. Perform daily weights. Low-sodium, heart healthy diet.   Dyspnea, chronic, likely due to underlying CHF with exacerbation at this time Continue supplemental oxygen therapy. Consider RT evaluation.   Elevated D-dimer Recommend CT angio to rule out PE.    HTN, reasonably controlled, patient is normotensive at this time Continue management with ACE inhibitor and beta-blocker therapy. Monitor blood pressure per protocol.   HLD, reasonably stable Continue statin therapy.    DM2, fairly controlled Continue CBG monitoring and sliding scale insulin per protocol.   Electrolyte imbalance, hypomagnesia noted upon arrival Trend CMP.   Devine Dant, [id number]-AG  07/22/2021 8:00 AM

## 2021-07-22 NOTE — ED Notes (Addendum)
Pt brief changed.  

## 2021-07-22 NOTE — TOC Initial Note (Addendum)
Transition of Care [facility]) - Initial/Assessment Note    Patient Details  Name: Stephanie [name] MRN: [id number] Date of Birth: October 24, 1946  Transition of [facility]) CM/SW Contact:    Stephanie [name] Phone Number: [phone] 07/22/2021, 4:37 PM  Clinical Narrative:                  Patient presents to [facility] due to fall.  Patient lives with Stephanie [name] (Daughter)  [phone] [facility] Phone). Patient needs assistance with most ADLs and daughter is primary care giver.  Patient has PT/OT /RN/Aide recommendation for home health. CSW spoke with Stephanie [name] at [facility] who will accept patient.   Expected Discharge Plan: [place] Barriers to Discharge: No Barriers Identified   Patient Goals and CMS Choice     Choice offered to / list presented to : Patient  Expected Discharge Plan and Services Expected Discharge Plan: [place] In-house Referral: Clinical Social Work   Post Acute Care Choice: [place] arrangements for the past 2 months: Single Family Home                           HH Arranged: PT, OT, Nurse's Aide, RN [place] Agency: [place] ([facility]) Date HH Agency Contacted: 07/22/21 Time [place]: 1636 Representative spoke with at [place]  Prior Living Arrangements/Services Living arrangements for the past 2 months: [place] with:: Adult Children Stephanie [name] (Daughter)   [phone] (Home Phone)) Patient language and need for interpreter reviewed:: Yes Do you feel safe going back to the place where you live?: Yes      Need for Family Participation in Patient Care: Yes (Comment) Care giver support system in place?: Yes (comment)   Criminal Activity/Legal Involvement Pertinent to Current Situation/Hospitalization: No - Comment as needed  Activities of Daily Living      Permission Sought/Granted Permission sought to share information with : Facility Automotive engineer    Share Information with NAME: Stephanie [name] (Daughter)   [phone] (Home Phone)           Emotional Assessment Appearance:: Appears stated age Attitude/Demeanor/Rapport: Self-Confident, Engaged Affect (typically observed): Stable Orientation: : Oriented to Self, Oriented to Place, Oriented to Situation, Oriented to  Time Alcohol / Substance Use: Not Applicable Psych Involvement: No (comment)  Admission diagnosis:  Acute on chronic systolic (congestive) heart failure (HCC) [I50.23] Patient Active Problem List   Diagnosis Date Noted   Acute on chronic systolic (congestive) heart failure ([place]) 07/20/2021   SOB (shortness of breath) 07/20/2021   Acute respiratory failure with hypoxia ([place]) 07/20/2021   Diabetes mellitus without complication ([place])    Hypothyroid    Parkinson's disease ([place])    Hyperlipidemia    Gout, unspecified 11/01/2020   Left bundle branch block 05/09/2019   Hyponatremia 12/31/2015   PCP:  [name], MD Pharmacy:   [facility] [address]), [place]) [place] [address] Phone: [phone] Fax: [place], [place] [address] Phone: [phone] Fax: [phone]     Social Determinants of Health (SDOH) Interventions    Readmission Risk Interventions No flowsheet data found.

## 2021-07-23 ENCOUNTER — Inpatient Hospital Stay: Payer: Medicare Other

## 2021-07-23 DIAGNOSIS — I5023 Acute on chronic systolic (congestive) heart failure: Secondary | ICD-10-CM | POA: Diagnosis not present

## 2021-07-23 LAB — BASIC METABOLIC PANEL
Anion gap: 10 (ref 5–15)
BUN: 14 mg/dL (ref 8–23)
CO2: 31 mmol/L (ref 22–32)
Calcium: 7.9 mg/dL — ABNORMAL LOW (ref 8.9–10.3)
Chloride: 92 mmol/L — ABNORMAL LOW (ref 98–111)
Creatinine, Ser: 0.62 mg/dL (ref 0.44–1.00)
GFR, Estimated: >60 mL/min (ref >60)
Glucose, Bld: 117 mg/dL — ABNORMAL HIGH (ref 70–99)
Potassium: 4 mmol/L (ref 3.5–5.1)
Sodium: 133 mmol/L — ABNORMAL LOW (ref 135–145)

## 2021-07-23 LAB — GLUCOSE, CAPILLARY
Glucose-Capillary: 126 mg/dL — ABNORMAL HIGH (ref 70–99)
Glucose-Capillary: 193 mg/dL — ABNORMAL HIGH (ref 70–99)
Glucose-Capillary: 138 mg/dL — ABNORMAL HIGH (ref 70–99)
Glucose-Capillary: 182 mg/dL — ABNORMAL HIGH (ref 70–99)

## 2021-07-23 LAB — MAGNESIUM: Magnesium: 1.6 mg/dL — ABNORMAL LOW (ref 1.7–2.4)

## 2021-07-23 MED ORDER — PRAMIPEXOLE DIHYDROCHLORIDE 0.25 MG PO TABS
0.1250 mg | ORAL_TABLET | Freq: Four times a day (QID) | ORAL | Status: DC | PRN
  Administered 2021-07-23 – 2021-07-27 (×4): 0.125 mg via ORAL
  Filled 2021-07-23 (×5): qty 0.5

## 2021-07-23 MED ORDER — FLUTICASONE PROPIONATE 50 MCG/ACT NA SUSP
1.0000 | Freq: Every day | NASAL | Status: DC
  Administered 2021-07-23 – 2021-07-28 (×6): 1 via NASAL
  Filled 2021-07-23: qty 16

## 2021-07-23 MED ORDER — LOSARTAN POTASSIUM 50 MG PO TABS
50.0000 mg | ORAL_TABLET | Freq: Every day | ORAL | Status: DC
  Administered 2021-07-23 – 2021-07-28 (×6): 50 mg via ORAL
  Filled 2021-07-23 (×6): qty 1

## 2021-07-23 NOTE — Progress Notes (Signed)
[facility]  Patient Description: Stephanie [name] is a 75 year old female patient with PMH significant for chronic systolic CHF (most recent EF = 40-45%), LBBB, hypertension, hyperlipidemia, diabetes mellitus type 2, chronic iron deficiency anemia, Parkinson's disease and obesity who presented to the ER due to worsening shortness of breath and found to be in CHF exacerbation, thus Cardiology was consulted.   SUBJECTIVE: The patient reports to be feeling well on today; however, she reports that she did not sleep well on last night. The patient denies having any cardiac complaints at this time and states that her breathing is significantly better on today.   (07/22/2021): Today the patient reports to be feeling slightly better since yesterday. She reports that she continues to have some dyspnea, but states that it has improved. The patient continues to deny having any chest pain, dizziness or palpitations at this time.   OBJECTIVE: The patient appears chronically ill, but stable on today. Bedside telemetry continues to reveal LBBB. She continues to be on supplemental o2 at 2L via [place] at this time with normal oxygen saturation. The patient's vss. She continues to be hypervolemic; however, it appears as if she is responding well to the IV diuresis upon examination. I&O's are still unreliable. Echocardiogram is remarkable for severe LV dysfunction with estimated EF of 25-30% which is a significant change from previous echo in 11/2020.   (07/22/2021): The patient appears stable on today. Her effort of breathing is normal; however, her breath sounds continues to be diminished and she continues to be on supplemental oxygen at 2L via [place] with normal oxygen saturation. Bedside telemonitor reveals LBBB. Patient continues to appear hypervolemic with BLE edema with compression wraps in place. Her urine output is also decreased and the urine is dark in color. There are concerns over the accuracy of the I/O's. The patient  is resting comfortably in stretch with rails up and is in NAD.   Vitals:   07/22/21 2332 07/23/21 0302 07/23/21 0500 07/23/21 0817  BP: (!) 144/77 (!) 159/126  (!) 153/96  Pulse: 83 73  95  Resp: 16 18  18   Temp: 97.9 F (36.6 C) 97.7 F (36.5 C)  97.9 F (36.6 C)  TempSrc: Oral Oral  Oral  SpO2: 91% 96%  100%  Weight:   87.9 kg   Height:         Intake/Output Summary (Last 24 hours) at 07/23/2021 0853 Last data filed at 07/23/2021 0815 Gross per 24 hour  Intake 240 ml  Output --  Net 240 ml      PHYSICAL EXAM  General: Well developed, well nourished, in no acute distress HEENT:  Normocephalic and atramatic, facial bruising noted to the right of the face Neck:   No JVD.  Lungs: Clear bilaterally to auscultation. Chest expansion symmetrical, no wheezes, rales or rhonchi Heart: HRRR . Normal S1 and S2 without gallops or murmurs.  Abdomen: Bowel sounds are positive, abdomen soft and non-tender  Msk:  Normal strength and tone for age. Extremities: moderate, 2 pitting edema noted to the BLE with erythema. No clubbing or cyanosis  Neuro: Alert and oriented X 3. Psych:  Good affect, responds appropriately   LABS: Basic Metabolic Panel: Recent Labs    07/21/21 0737 07/22/21 0610 07/23/21 0443  NA 142 136 133*  K 3.6 3.8 4.0  CL 97* 94* 92*  CO2 36* 33* 31  GLUCOSE 134* 125* 117*  BUN 18 16 14   CREATININE 0.71 0.59 0.62  CALCIUM 8.3* 8.2* 7.9*  MG 1.5* 2.1 1.6*  PHOS 3.2  --   --    Liver Function Tests: Recent Labs    07/20/21 1824 07/21/21 0737  AST 22 21  ALT 7 5  ALKPHOS 103 97  BILITOT 1.3* 1.3*  PROT 7.1 7.0  ALBUMIN 3.2* 2.7*   No results for input(s): LIPASE, AMYLASE in the last 72 hours. CBC: Recent Labs    07/20/21 1824 07/21/21 0737  WBC 6.6 6.5  NEUTROABS 4.6  --   HGB 12.0 11.3*  HCT 38.9 38.0  MCV 83.3 83.7  PLT 270 260   Cardiac Enzymes: No results for input(s): CKTOTAL, CKMB, CKMBINDEX, TROPONINI in the last 72  hours. BNP: Invalid input(s): POCBNP D-Dimer: Recent Labs    07/21/21 0737  DDIMER 1.69*   Hemoglobin A1C: No results for input(s): HGBA1C in the last 72 hours. Fasting Lipid Panel: No results for input(s): CHOL, HDL, LDLCALC, TRIG, CHOLHDL, LDLDIRECT in the last 72 hours. Thyroid Function Tests: No results for input(s): TSH, T4TOTAL, T3FREE, THYROIDAB in the last 72 hours.  Invalid input(s): FREET3 Anemia Panel: No results for input(s): VITAMINB12, FOLATE, FERRITIN, TIBC, IRON, RETICCTPCT in the last 72 hours.  CT Angio Chest Pulmonary Embolism (PE) W or WO Contrast  Result Date: 07/22/2021 CLINICAL DATA:  Acute shortness of breath, swelling, fluid retention, acute on chronic systolic heart failure EXAM: CT ANGIOGRAPHY CHEST WITH CONTRAST TECHNIQUE: Multidetector CT imaging of the chest was performed using the standard protocol during bolus administration of intravenous contrast. Multiplanar CT image reconstructions and MIPs were obtained to evaluate the vascular anatomy. CONTRAST:  14mL OMNIPAQUE IOHEXOL 350 MG/ML SOLN COMPARISON:  12/31/2015 FINDINGS: Cardiovascular: Pulmonary arteries appear patent and normal in caliber. No acute filling defect or pulmonary embolus demonstrated by CTA. Atherosclerotic changes of the aorta. Patent 3 vessel arch anatomy. Negative for aneurysm or acute dissection. No mediastinal hemorrhage or hematoma. Native coronary atherosclerosis noted. Marked cardiomegaly. No pericardial effusion. Central venous structures are patent.  No veno-occlusive process. Mediastinum/Nodes: Limited visualization of the thyroid. Trachea and central airways are patent. Upper thoracic esophagus intraluminal soft tissue content and air noted, images 62 through 91/5. This may represent ingested food, but difficult to exclude underlying esophageal mural lesion or abnormality. Consider follow-up esophageal evaluation such as esophagram. No adenopathy. Lungs/Pleura: Diffuse interlobular  septal thickening and patchy ground-glass attenuation, compatible with mild edema. Scattered areas of bandlike opacity throughout the right lung as well as the lingula and left lower lobe favored to be atelectasis. Difficult to exclude early pneumonia. No pleural abnormality, significant effusion, or pneumothorax. Upper Abdomen: Limited assessment. Dilated hepatic IVC when compared to the prior studies suggesting component of right heart failure. No acute upper abdominal finding. Musculoskeletal: Lower chest subcutaneous edema compatible with anasarca. Degenerative changes throughout the spine. No acute osseous finding. No compression fracture. Sternum intact. Review of the MIP images confirms the above findings. IMPRESSION: Negative for significant acute pulmonary embolus by CTA. Cardiomegaly with mild interstitial pulmonary edema pattern compatible with early CHF. Scattered areas of bilateral bandlike opacities throughout both lungs favored to be atelectasis. Difficult to exclude early pneumonia. Upper thoracic esophagus intraluminal soft tissue content and air may represent ingested impacted food. Underlying esophageal lesion not excluded. Recommend follow-up esophageal evaluation and or esophagram. Lower thoracic anasarca throughout the soft tissues. Aortic Atherosclerosis (ICD10-I70.0). Electronically Signed   By: Jerilynn Mages.  Shick M.D.   On: 07/22/2021 10:45   US Venous Img Lower Bilateral (DVT)  Result Date: 07/21/2021 CLINICAL DATA:  Positive D-dimer Pain  Swelling Fall 2-3 weeks ago Color changes EXAM: BILATERAL LOWER EXTREMITY VENOUS DOPPLER ULTRASOUND TECHNIQUE: Gray-scale sonography with compression, as well as color and duplex ultrasound, were performed to evaluate the deep venous system(s) from the level of the common femoral vein through the popliteal and proximal calf veins. COMPARISON:  None. FINDINGS: VENOUS Normal compressibility of the common femoral, superficial femoral, and popliteal veins, as well  as the visualized calf veins. Visualized portions of profunda femoral vein and great saphenous vein unremarkable. No filling defects to suggest DVT on grayscale or color Doppler imaging. Doppler waveforms show normal direction of venous flow, normal respiratory plasticity and response to augmentation. OTHER None. Limitations: Calf veins not well visualized due to edema. IMPRESSION: No lower extremity DVT. Limited assessment of the calf veins due to edema. Electronically Signed   By: Miachel Roux M.D.   On: 07/21/2021 14:55   ECHOCARDIOGRAM COMPLETE  Result Date: 07/22/2021    ECHOCARDIOGRAM REPORT   Patient Name:   STANISLAWA GAFFIN [facility] Date of Exam: 07/21/2021 Medical Rec #:  [id number]          Height:       61.0 in Accession #:    [id number]         Weight:       195.0 lb Date of Birth:  1946-09-24         BSA:          1.869 m Patient Age:    44 years           BP:           103/78 mmHg Patient Gender: F                  HR:           66 bpm. Exam Location:  [facility] Procedure: 2D Echo, Cardiac Doppler and Color Doppler Indications:     D63.87 Acute Systolic CHF  History:         Patient has no prior history of Echocardiogram examinations.                  Risk Factors:Diabetes, Dyslipidemia and Hypertension. Sleep                  apnea. Parkinson's Disease. Dysrhythmia. Hypothyroidism.                  Syncope.  Sonographer:     Cresenciano Lick RDCS Referring Phys:  [id number] Rhetta Mura Diagnosing Phys: Yolonda Kida MD IMPRESSIONS  1. Left ventricular ejection fraction, by estimation, is 25 to 30%. The left ventricle has severely decreased function. The left ventricle demonstrates global hypokinesis. The left ventricular internal cavity size was moderately dilated. Left ventricular diastolic parameters were normal.  2. Right ventricular systolic function is mildly reduced. The right ventricular size is moderately enlarged.  3. Left atrial size was moderately dilated.  4. Right atrial size was mild  to moderately dilated.  5. The mitral valve is grossly normal. Severe mitral valve regurgitation.  6. Tricuspid valve regurgitation is severe.  7. The aortic valve is grossly normal. Aortic valve regurgitation is mild. Mild aortic valve sclerosis is present, with no evidence of aortic valve stenosis. FINDINGS  Left Ventricle: Left ventricular ejection fraction, by estimation, is 25 to 30%. The left ventricle has severely decreased function. The left ventricle demonstrates global hypokinesis. The left ventricular internal cavity size was moderately dilated. There is no left ventricular hypertrophy. Left ventricular diastolic  parameters were normal. Right Ventricle: The right ventricular size is moderately enlarged. No increase in right ventricular wall thickness. Right ventricular systolic function is mildly reduced. Left Atrium: Left atrial size was moderately dilated. Right Atrium: Right atrial size was mild to moderately dilated. Pericardium: There is no evidence of pericardial effusion. Mitral Valve: The mitral valve is grossly normal. There is moderate thickening of the mitral valve leaflet(s). Severe mitral valve regurgitation. Tricuspid Valve: The tricuspid valve is grossly normal. Tricuspid valve regurgitation is severe. Aortic Valve: The aortic valve is grossly normal. Aortic valve regurgitation is mild. Aortic regurgitation PHT measures 522 msec. Mild aortic valve sclerosis is present, with no evidence of aortic valve stenosis. Pulmonic Valve: The pulmonic valve was normal in structure. Pulmonic valve regurgitation is trivial. Aorta: The ascending aorta was not well visualized. IAS/Shunts: No atrial level shunt detected by color flow Doppler.  LEFT VENTRICLE PLAX 2D LVIDd:         5.40 cm  Diastology LVIDs:         4.70 cm  LV e' medial:    4.03 cm/s LV PW:         0.90 cm  LV E/e' medial:  24.4 LV IVS:        0.70 cm  LV e' lateral:   10.60 cm/s LVOT diam:     1.70 cm  LV E/e' lateral: 9.3 LV SV:          31 LV SV Index:   17 LVOT Area:     2.27 cm  RIGHT VENTRICLE            IVC RV Basal diam:  3.50 cm    IVC diam: 2.30 cm RV S prime:     9.03 cm/s TAPSE (M-mode): 1.2 cm LEFT ATRIUM             Index       RIGHT ATRIUM           Index LA diam:        5.30 cm 2.84 cm/m  RA Area:     18.40 cm LA Vol (A2C):   93.4 ml 49.99 ml/m RA Volume:   53.40 ml  28.58 ml/m LA Vol (A4C):   81.6 ml 43.67 ml/m LA Biplane Vol: 87.6 ml 46.88 ml/m  AORTIC VALVE LVOT Vmax:   64.20 cm/s LVOT Vmean:  51.200 cm/s LVOT VTI:    0.136 m AI PHT:      522 msec  AORTA Ao Root diam: 2.50 cm Ao Asc diam:  3.40 cm MITRAL VALVE               TRICUSPID VALVE MV Area (PHT): 4.31 cm    TR Peak grad:   38.9 mmHg MV Decel Time: 176 msec    TR Vmax:        312.00 cm/s MV E velocity: 98.50 cm/s MV A velocity: 91.30 cm/s  SHUNTS MV E/A ratio:  1.08        Systemic VTI:  0.14 m                            Systemic Diam: 1.70 cm Dwayne Prince [place] MD Electronically signed by Yolonda Kida MD Signature Date/Time: 07/22/2021/12:58:29 PM    Final      Echo: results pending   TELEMETRY: LBBB  ASSESSMENT AND PLAN:  Principal Problem:   Acute on chronic systolic (congestive) heart failure (HCC) Active Problems:  SOB (shortness of breath)   Acute respiratory failure with hypoxia (HCC)   Diabetes mellitus without complication (HCC)   Hypothyroid   Parkinson's disease ([place])   Hyperlipidemia    Acute on chronic systolic CHF with reduced EF,  fairly stable at this time, patient appears hypervolemic at this time with dyspnea and BLE edema, Echocardiogram is remarkable for severe LV dysfunction with estimated EF of 25-30%  Continuous cardiac telemetry monitoring until discharge. Echocardiogram is remarkable for severe LV dysfunction with estimated EF of 25-30% which is a significant change from previous echo in 11/2020.  Continue diuresis with IV Lasix.  Consider transitioning to oral torsemide therapy once patient is diuresed and stable.   We will start Losartan 50mg  once daily. Recommend transitioning to entresto as outpatient.  Monitor strict I's and O's. Consider UA and Urine culture due to discoloration. Perform daily weights. Low-sodium, heart healthy diet. Recommend HF clinic referral.    Dyspnea, chronic, likely due to underlying CHF with exacerbation at this time Continue supplemental oxygen therapy. Consider RT evaluation.   Elevated D-dimer  CT negative for Pulmonary embolus.    HTN, reasonably controlled, patient is normotensive at this time Continue management with ARB inhibitor and beta-blocker therapy. Monitor blood pressure per protocol.   HLD, reasonably stable Continue statin therapy.    DM2, fairly controlled Continue CBG monitoring and sliding scale insulin per protocol.   Electrolyte imbalance, hypomagnesia noted upon arrival Trend CMP.   [place], ACNPC-AG  07/23/2021 8:53 AM

## 2021-07-23 NOTE — Evaluation (Signed)
Objective Swallowing Evaluation: Type of Study: MBS-Modified Barium Swallow Study   Patient Details  Name: Stephanie [name] MRN: [id number] Date of Birth: 1946-04-10  Today's Date: 07/23/2021 Time: SLP Start Time (ACUTE ONLY): 1200 -SLP Stop Time (ACUTE ONLY): 1300  SLP Time Calculation (min) (ACUTE ONLY): 60 min   Past Medical History:  Past Medical History:  Diagnosis Date   Anemia    Arthritis    Chronic back pain    Diabetes mellitus without complication (HCC)    Dysrhythmia    GERD (gastroesophageal reflux disease)    Hyperlipidemia    Hypertension    Hypothyroid    Multilevel degenerative disc disease    Parkinson's disease ([place])    Sleep apnea    Syncope    Tremors of nervous system    Wears dentures    full upper and lower   Past Surgical History:  Past Surgical History:  Procedure Laterality Date   ABDOMINAL HYSTERECTOMY     BACK SURGERY  09/16   CARDIAC CATHETERIZATION     CATARACT EXTRACTION W/PHACO Right 11/24/2015   Procedure: CATARACT EXTRACTION PHACO AND INTRAOCULAR LENS PLACEMENT ([place]);  Surgeon: Estill Cotta, MD;  Location: [facility];  Service: Ophthalmology;  Laterality: Right;  [place] 01:14 AP% 25.5 CDE 30.69 fluid pack lot # [id number]   CATARACT EXTRACTION W/PHACO Left 01/06/2021   Procedure: CATARACT EXTRACTION PHACO AND INTRAOCULAR LENS PLACEMENT ([facility]) LEFT DIABETIC 6.98 00:40.1;  Surgeon: Birder Robson, MD;  Location: [place];  Service: Ophthalmology;  Laterality: Left;  Diabetic - oral meds   COLONOSCOPY     COLONOSCOPY WITH PROPOFOL N/A 07/24/2019   Procedure: COLONOSCOPY WITH PROPOFOL;  Surgeon: Toledo, Benay Pike, MD;  Location: [facility];  Service: Gastroenterology;  Laterality: N/A;   ESOPHAGOGASTRODUODENOSCOPY N/A 07/24/2019   Procedure: ESOPHAGOGASTRODUODENOSCOPY (EGD);  Surgeon: Toledo, Benay Pike, MD;  Location: [facility];  Service: Gastroenterology;  Laterality: N/A;   OOPHORECTOMY     HPI: Pt is a 75 y.o.  female with a past medical history of anemia, diabetes, gastric reflux, hypertension, hyperlipidemia, CHF, Parkinson's Dis, Tremors, who presents to the emergency department from her cardiology office for fluid overload.  According to the patient and her daughter patient has a history of CHF has had worsening fluid accumulation with lower extremity edema and now weepage.  Worsening shortness of breath that has acutely worsened over the past 2 to 3 days.  Patient was seen by Dr. Nehemiah Massed of cardiology today, they had increased her Lasix on Saturday but have not noted any improvement in the patient's presented to the emergency department today.  Here the patient is satting around 88% on room air, she does wear 2 L of oxygen if needed at home which she does typically not needed but has needed recently.  Patient satting in the mid 90s on 2 L.  Denies any chest pain.  Patient does have bruising to her face which she states was from a Fall approximately 2 weeks ago, she is already been worked up for the Fall.  Pt has Significant c/o Esophageal Dysmotility; EGD in 2020 revealed: "Patchy moderate inflammation characterized by erosions and  erythema was found in the gastric body and in the gastric antrum".    CXR this admit: "Stable cardiomegaly with mild, stable bibasilar linear scarring  and/or atelectasis.".   Subjective: pt awake, verbal. min involuntary movements.    Assessment / Plan / Recommendation  CHL IP CLINICAL IMPRESSIONS 07/23/2021  Clinical Impression Pt appears to present w/ Mild  oropharyngeal phase dysphagia and is at min increased risk for aspiration of po's, moreso thin liquids. This risk for aspiration can be greatly reduced by following general aspiration precautions including SMALL, SINGLE sips of thin liquids via CUP, and using a f/u, DRY SWALLOW strategy.   The pharyngeal phase of swallowing was c/b delayed pharyngeal swallow initiation moreso w/ thin liquids -- thin liquids Via Cup spilled  into the pharynx triggering the pharyngeal swallow when spilling from the Valleculae, and also when filling the Pyriform Sinuses.  NO laryngeal penetration or aspiration noted; adequate airway protection noted during trials.  Min pharyngeal residue remained in both the valleculae and pyriform sinuses post initial swallow indicating reduced pharynegal pressure and laryngeal excursion during the swallowing. Pt was able to reduce and/or clear any bolus residue w/ a f/u, DRY swallow,as instructed to do.  During the oral phase, min premature spillage and loss of bolus over BOT was noted w/ thin liquids; improved bolus control noted w/ increased viscosity trials. Time was required for pt to fully masticate increased textured trials of foods d/t missing all bottom Dentition. Moistening the foods appeared to aid mastication/mashing.    No (viewable) cervical Esophagus deficits noted. A more distal view of the Esophagus was not doable during this study today. Pt endorsed BASELINE Esophageal phase deficits including REFLUX and REGURGITATION issues at home.  Recommend a more Mech Soft diet consistency for ease of mastication and clearing of the Esophagus w/ Thin liquids via Cup; aspiration precautions; f/u, DRY swallow post bites/sips. REFLUX precautions.  SLP Visit Diagnosis Dysphagia, oropharyngeal phase (R13.12)  Attention and concentration deficit following --  Frontal lobe and executive function deficit following --  Impact on safety and function Mild aspiration risk;Risk for inadequate nutrition/hydration      CHL IP TREATMENT RECOMMENDATION 07/23/2021  Treatment Recommendations Therapy as outlined in treatment plan below     Prognosis 07/23/2021  Prognosis for Safe Diet Advancement Fair  Barriers to Reach Goals Time post onset;Severity of deficits  Barriers/Prognosis Comment Parkinson's dis., Esophageal dysmotility baseline    CHL IP DIET RECOMMENDATION 07/23/2021  SLP Diet Recommendations  Dysphagia 3 (Mech soft) solids;Thin liquid  Liquid Administration via Cup  Medication Administration Whole meds with puree  Compensations Minimize environmental distractions;Slow rate;Small sips/bites;Lingual sweep for clearance of pocketing;Multiple dry swallows after each bite/sip;Follow solids with liquid  Postural Changes Remain semi-upright after after feeds/meals (Comment);Seated upright at 90 degrees      CHL IP OTHER RECOMMENDATIONS 07/23/2021  Recommended Consults Consider GI evaluation;Consider esophageal assessment  Oral Care Recommendations Oral care BID;Staff/trained caregiver to provide oral care  Other Recommendations (No Data)      CHL IP FOLLOW UP RECOMMENDATIONS 07/23/2021  Follow up Recommendations None      CHL IP FREQUENCY AND DURATION 07/23/2021  Speech Therapy Frequency (ACUTE ONLY) min 1 x/week  Treatment Duration 1 week           CHL IP ORAL PHASE 07/23/2021  Oral Phase Impaired  Oral - Pudding Teaspoon --  Oral - Pudding Cup --  Oral - Honey Teaspoon --  Oral - Honey Cup --  Oral - Nectar Teaspoon --  Oral - Nectar Cup 1 trial  Oral - Nectar Straw --  Oral - Thin Teaspoon --  Oral - Thin Cup Decreased bolus cohesion;Premature spillage; 9 trials  Oral - Thin Straw --  Oral - Puree 4 trials  Oral - Mech Soft 3 trials  Oral - Regular --  Oral - Multi-Consistency --  Oral -  Pill --  Oral Phase - Comment min premature spillage and loss of bolus over BOT; improved bolus control w/ increased viscosity trials. Time was required for pt to fully masticate increased textured trials of foods d/t missing all bottom Dentition. Moistening the foods appeared to aid mastication/mashing.    CHL IP PHARYNGEAL PHASE 07/23/2021  Pharyngeal Phase Impaired  Pharyngeal- Pudding Teaspoon --  Pharyngeal --  Pharyngeal- Pudding Cup --  Pharyngeal --  Pharyngeal- Honey Teaspoon --  Pharyngeal --  Pharyngeal- Honey Cup --  Pharyngeal --  Pharyngeal- Nectar Teaspoon --   Pharyngeal --  Pharyngeal- Nectar Cup 1 trial  Pharyngeal --  Pharyngeal- Nectar Straw --  Pharyngeal --  Pharyngeal- Thin Teaspoon --  Pharyngeal --  Pharyngeal- Thin Cup Delayed swallow initiation-vallecula;Delayed swallow initiation-pyriform sinuses;Reduced pharyngeal peristalsis;Reduced laryngeal elevation  Pharyngeal Material does not enter airway; 9 trials  Pharyngeal- Thin Straw --  Pharyngeal --  Pharyngeal- Puree 4 trials  Pharyngeal --  Pharyngeal- Mechanical Soft 3 trials  Pharyngeal --  Pharyngeal- Regular --  Pharyngeal --  Pharyngeal- Multi-consistency --  Pharyngeal --  Pharyngeal- Pill --  Pharyngeal --  Pharyngeal Comment delayed pharyngeal swallow initiation moreso w/ thin liquids; thin liquids Via Cup spilled into the pharynx triggering the pharyngeal swallow spilling from the Valleculae, and also when filling the Pyriform Sinuses. NO laryngeal penetration or aspiration noted; adequate airway protection noted during trials. Min pharyngeal residue remained in both the valleculae and pyriform sinuses post initial swallow indicating reduced pharynegal pressure and laryngeal excursion during the swallowing. Pt was able to reduce and/or clear any bolus residue w/ a f/u, DRY swallow,as instructed to do.     CHL IP CERVICAL ESOPHAGEAL PHASE 07/23/2021  Cervical Esophageal Phase WFL  Pudding Teaspoon --  Pudding Cup --  Honey Teaspoon --  Honey Cup --  Nectar Teaspoon --  Nectar Cup --  Nectar Straw --  Thin Teaspoon --  Thin Cup --  Thin Straw --  Puree --  Mechanical Soft --  Regular --  Multi-consistency --  Pill --  Cervical Esophageal Comment --          Orinda Kenner, MS, CCC-SLP Speech Language Pathologist [facility] [phone] [place] 07/23/2021, 5:18 PM

## 2021-07-23 NOTE — Progress Notes (Signed)
Mobility Specialist - Progress Note   07/23/21 1700  Mobility  Activity Transferred to/from [facility]  Range of Motion/Exercises Right leg;Left leg  Level of Assistance Minimal assist, patient does 75% or more  Assistive Device Front wheel walker  Distance Ambulated (ft) 5 ft  Mobility Ambulated with assistance in room;Out of bed to chair with meals  Mobility Response Tolerated well  Mobility performed by Mobility specialist  $Mobility charge 1 Mobility    Pre-mobility: 82 HR, 92% SpO2 Post-mobility: 101 HR, 97% SpO2   Pt sleeping upon arrival, awakened by voice. Utilizing 2L. Pt agreeable to sitting in chair. ModA to reach EOB. Does voice stiffness in back and LE. Stood to [employer] and ambulated to recliner where she performed standing therex prior to sitting. VC for sequencing. Does remove O2 several times throughout session, voiced importance of O2 use as pt would desat to mid-upper 80s. Pt repositioned in recliner with pillow wedge support for comfort. Alarm set, needs in reach. RN made aware.    Kathee Delton Mobility Specialist 07/23/21, 5:31 PM

## 2021-07-23 NOTE — Plan of Care (Signed)
New admit patient arrived at the unit around 8 pm. Oriented to the unit, and welcome to 2 A. C/O pain to back and legs. Given Percocet 1 tablet once. Wounds to BLE  unable to assess dressing on place.maintained  SPO2>92% on 3 LNC. No BM                                                                                                                                                                                                                          Problem: Clinical Measurements: Goal: Will remain free from infection Outcome: Progressing   Problem: Clinical Measurements: Goal: Respiratory complications will improve Outcome: Progressing   Problem: Activity: Goal: Risk for activity intolerance will decrease Outcome: Progressing   Problem: Nutrition: Goal: Adequate nutrition will be maintained Outcome: Progressing   Problem: Coping: Goal: Level of anxiety will decrease Outcome: Progressing   Problem: Elimination: Goal: Will not experience complications related to urinary retention Outcome: Progressing   Problem: Safety: Goal: Ability to remain free from injury will improve Outcome: Progressing   Problem: Skin Integrity: Goal: Risk for impaired skin integrity will decrease Outcome: Progressing

## 2021-07-23 NOTE — Progress Notes (Signed)
PROGRESS NOTE    Stephanie [name]  XBL:[id number] DOB: [birth year] DOA: 07/20/2021 PCP: [name], MD    Brief Narrative:   Hx hfpef, last hospitalized for such at [facility] 11/2020, here with at least several weeks progressively worsening dyspnea on exertion, found to have decompensated heart failure.  CT angiography of the thorax ruled out PE however was mentioned density in esophagus possible impacted food bolus.  On interview patient states that she has had years of esophageal dysmotility.  Review of medical records significant for EGD and colonoscopy in 2020.  Patchy moderate inflammation characterized by erosions and erythema in gastric body and antrum.  Cardiology following.  Patient on diuresis.  Continues to be short of breath.  Pursuing modified barium swallow study and possible esophagram versus repeat CT versus GI consultation.  Assessment & Plan:   Principal Problem:   Acute on chronic systolic (congestive) heart failure (HCC) Active Problems:   SOB (shortness of breath)   Acute respiratory failure with hypoxia (HCC)   Diabetes mellitus without complication (HCC)   Hypothyroid   Parkinson's disease (HCC)   Hyperlipidemia  Acute on chronic systolic congestive heart failure Acute hypoxic respiratory failure pulmonary hypertension EF 1/22 at [facility] 55%, moderate pulmonary hypertension.  Repeat echocardiogram on 9/20 EF 25 to 30% Here bnp elevated to 900s, lower extremity edema, o2 in mid 80s improved to normal on 2 L.  Has intermittent need for o2 at home, usually at night.  Has received lasix here w/ symptomatic improvement.  Initial troponin neg, no chest pain.  Likely underlying osa.  Dry weight per daughter 75; 195 here. Dimer is elevated CT angio ruled out PE Urine output not recorded accurately Plan: Continue Lasix 40 mg IV twice daily, uptitrate as necessary Goal net -1-1.5 L daily Cardiology follow-up Therapy evaluations  Possible esophageal foreign  body Unclear etiology, noted on CT angio SLP evaluation complete Recommend modified barium swallow Depending on results of MBSS, possible esophagram versus GI evaluation Per patient she has been having years long issues with esophageal dysmotility EGD in 2020 did not demonstrate any impacted or obstructive lesion   HTN Here bp wnl - diuresis as above - home metop, enalapril, statin   Parkinsons - home sinimet, amantadine   Gout - home allopurinol   T2DM Here glucose wnl - hold home pio and met - SSI   Hypothyroid - home synthroid   DVT prophylaxis: SQ Lovenox Code Status: Full Family Communication: None today Disposition Plan: Status is: Inpatient  Remains inpatient appropriate because:Inpatient level of care appropriate due to severity of illness  Dispo: The patient is from: Home              Anticipated d/c is to: Home              Patient currently is not medically stable to d/c.   Difficult to place patient No       Level of care: Med-Surg  Consultants:  Cardiology  Procedures:  None  Antimicrobials:  None   Subjective: Patient seen and examined.  Continues to endorse shortness of breath.  No pain complaints.  Objective: Vitals:   07/22/21 2332 07/23/21 0302 07/23/21 0500 07/23/21 0817  BP: (!) 144/77 (!) 159/126  (!) 153/96  Pulse: 83 73  95  Resp: _0 Temp: 97.9 F (36.6 C) 97.7 F (36.5 C)  97.9 F (36.6 C)  TempSrc: Oral Oral  Oral  SpO2: 91% 96%  100%  Weight:  87.9 kg   Height:        Intake/Output Summary (Last 24 hours) at 07/23/2021 1145 Last data filed at 07/23/2021 1022 Gross per 24 hour  Intake 480 ml  Output --  Net 480 ml   Filed Weights   07/20/21 1800 07/23/21 0500  Weight: 88.5 kg 87.9 kg    Examination:  General exam: No apparent distress Respiratory system: Bibasilar crackles.  Normal work of breathing.  2 L Cardiovascular system: S1-S2, RRR, no murmurs, no pedal edema Gastrointestinal system:  Soft, nontender, nondistended, normal bowel sounds Central nervous system: Alert and oriented. No focal neurological deficits. Extremities: Symmetric 5 x 5 power. Skin: No rashes, lesions or ulcers Psychiatry: Judgement and insight appear normal. Mood & affect appropriate.     Data Reviewed: I have personally reviewed following labs and imaging studies  CBC: Recent Labs  Lab 07/20/21 1824 07/21/21 0737  WBC 6.6 6.5  NEUTROABS 4.6  --   HGB 12.0 11.3*  HCT 38.9 38.0  MCV 83.3 83.7  PLT 270 092   Basic Metabolic Panel: Recent Labs  Lab 07/20/21 1824 07/21/21 0737 07/22/21 0610 07/23/21 0443  NA 138 142 136 133*  K 3.6 3.6 3.8 4.0  CL 100 97* 94* 92*  CO2 31 36* 33* 31  GLUCOSE 212* 134* 125* 117*  BUN 25* _0 CREATININE 0.82 0.71 0.59 0.62  CALCIUM 8.4* 8.3* 8.2* 7.9*  MG 1.5* 1.5* 2.1 1.6*  PHOS  --  3.2  --   --    GFR: Estimated Creatinine Clearance: 62.1 mL/min (by C-G formula based on SCr of 0.62 mg/dL). Liver Function Tests: Recent Labs  Lab 07/20/21 1824 07/21/21 0737  AST 22 21  ALT 7 5  ALKPHOS 103 97  BILITOT 1.3* 1.3*  PROT 7.1 7.0  ALBUMIN 3.2* 2.7*   No results for input(s): LIPASE, AMYLASE in the last 168 hours. No results for input(s): AMMONIA in the last 168 hours. Coagulation Profile: Recent Labs  Lab 07/21/21 0737  INR 1.2   Cardiac Enzymes: No results for input(s): CKTOTAL, CKMB, CKMBINDEX, TROPONINI in the last 168 hours. BNP (last 3 results) No results for input(s): PROBNP in the last 8760 hours. HbA1C: No results for input(s): HGBA1C in the last 72 hours. CBG: Recent Labs  Lab 07/22/21 0113 07/22/21 0822 07/22/21 1158 07/22/21 1647 07/23/21 0753  GLUCAP 180* 146* 277* 177* 126*   Lipid Profile: No results for input(s): CHOL, HDL, LDLCALC, TRIG, CHOLHDL, LDLDIRECT in the last 72 hours. Thyroid Function Tests: No results for input(s): TSH, T4TOTAL, FREET4, T3FREE, THYROIDAB in the last 72 hours. Anemia  Panel: No results for input(s): VITAMINB12, FOLATE, FERRITIN, TIBC, IRON, RETICCTPCT in the last 72 hours. Sepsis Labs: Recent Labs  Lab 07/20/21 1824  PROCALCITON <0.10    Recent Results (from the past 240 hour(s))  Resp Panel by RT-PCR (Flu A&B, Covid) Nasopharyngeal Swab     Status: None   Collection Time: 07/20/21  9:35 PM   Specimen: Nasopharyngeal Swab; Nasopharyngeal(NP) swabs in vial transport medium  Result Value Ref Range Status   SARS Coronavirus 2 by RT PCR NEGATIVE NEGATIVE Final    Comment: (NOTE) SARS-CoV-2 target nucleic acids are NOT DETECTED.  The SARS-CoV-2 RNA is generally detectable in upper respiratory specimens during the acute phase of infection. The lowest concentration of SARS-CoV-2 viral copies this assay can detect is 138 copies/mL. A negative result does not preclude SARS-Cov-2 infection and should not be used as the sole basis  for treatment or other patient management decisions. A negative result may occur with  improper specimen collection/handling, submission of specimen other than nasopharyngeal swab, presence of viral mutation(s) within the areas targeted by this assay, and inadequate number of viral copies(<138 copies/mL). A negative result must be combined with clinical observations, patient history, and epidemiological information. The expected result is Negative.  Fact Sheet for Patients:  EntrepreneurPulse.com.au  Fact Sheet for Healthcare Providers:  IncredibleEmployment.be  This test is no t yet approved or cleared by the [place] FDA and  has been authorized for detection and/or diagnosis of SARS-CoV-2 by FDA under an Emergency Use Authorization (EUA). This EUA will remain  in effect (meaning this test can be used) for the duration of the COVID-19 declaration under Section 564(b)(1) of the Act, 21 U.S.C.section 360bbb-3(b)(1), unless the authorization is terminated  or revoked sooner.        Influenza A by PCR NEGATIVE NEGATIVE Final   Influenza B by PCR NEGATIVE NEGATIVE Final    Comment: (NOTE) The Xpert Xpress SARS-CoV-2/FLU/RSV plus assay is intended as an aid in the diagnosis of influenza from Nasopharyngeal swab specimens and should not be used as a sole basis for treatment. Nasal washings and aspirates are unacceptable for Xpert Xpress SARS-CoV-2/FLU/RSV testing.  Fact Sheet for Patients: EntrepreneurPulse.com.au  Fact Sheet for Healthcare Providers: IncredibleEmployment.be  This test is not yet approved or cleared by the [place] FDA and has been authorized for detection and/or diagnosis of SARS-CoV-2 by FDA under an Emergency Use Authorization (EUA). This EUA will remain in effect (meaning this test can be used) for the duration of the COVID-19 declaration under Section 564(b)(1) of the Act, 21 U.S.C. section 360bbb-3(b)(1), unless the authorization is terminated or revoked.  Performed at [facility], [address] [place], [place] [address]          Radiology Studies: CT Angio Chest Pulmonary Embolism (PE) W or WO Contrast  Result Date: 07/22/2021 CLINICAL DATA:  Acute shortness of breath, swelling, fluid retention, acute on chronic systolic heart failure EXAM: CT ANGIOGRAPHY CHEST WITH CONTRAST TECHNIQUE: Multidetector CT imaging of the chest was performed using the standard protocol during bolus administration of intravenous contrast. Multiplanar CT image reconstructions and MIPs were obtained to evaluate the vascular anatomy. CONTRAST:  63m OMNIPAQUE IOHEXOL 350 MG/ML SOLN COMPARISON:  12/31/2015 FINDINGS: Cardiovascular: Pulmonary arteries appear patent and normal in caliber. No acute filling defect or pulmonary embolus demonstrated by CTA. Atherosclerotic changes of the aorta. Patent 3 vessel arch anatomy. Negative for aneurysm or acute dissection. No mediastinal hemorrhage or hematoma. Native  coronary atherosclerosis noted. Marked cardiomegaly. No pericardial effusion. Central venous structures are patent.  No veno-occlusive process. Mediastinum/Nodes: Limited visualization of the thyroid. Trachea and central airways are patent. Upper thoracic esophagus intraluminal soft tissue content and air noted, images 62 through 91/5. This may represent ingested food, but difficult to exclude underlying esophageal mural lesion or abnormality. Consider follow-up esophageal evaluation such as esophagram. No adenopathy. Lungs/Pleura: Diffuse interlobular septal thickening and patchy ground-glass attenuation, compatible with mild edema. Scattered areas of bandlike opacity throughout the right lung as well as the lingula and left lower lobe favored to be atelectasis. Difficult to exclude early pneumonia. No pleural abnormality, significant effusion, or pneumothorax. Upper Abdomen: Limited assessment. Dilated hepatic IVC when compared to the prior studies suggesting component of right heart failure. No acute upper abdominal finding. Musculoskeletal: Lower chest subcutaneous edema compatible with anasarca. Degenerative changes throughout the spine. No acute osseous finding.  No compression fracture. Sternum intact. Review of the MIP images confirms the above findings. IMPRESSION: Negative for significant acute pulmonary embolus by CTA. Cardiomegaly with mild interstitial pulmonary edema pattern compatible with early CHF. Scattered areas of bilateral bandlike opacities throughout both lungs favored to be atelectasis. Difficult to exclude early pneumonia. Upper thoracic esophagus intraluminal soft tissue content and air may represent ingested impacted food. Underlying esophageal lesion not excluded. Recommend follow-up esophageal evaluation and or esophagram. Lower thoracic anasarca throughout the soft tissues. Aortic Atherosclerosis (ICD10-I70.0). Electronically Signed   By: Jerilynn Mages.  Shick M.D.   On: 07/22/2021 10:45   US  Venous Img Lower Bilateral (DVT)  Result Date: 07/21/2021 CLINICAL DATA:  Positive D-dimer Pain Swelling Fall 2-3 weeks ago Color changes EXAM: BILATERAL LOWER EXTREMITY VENOUS DOPPLER ULTRASOUND TECHNIQUE: Gray-scale sonography with compression, as well as color and duplex ultrasound, were performed to evaluate the deep venous system(s) from the level of the common femoral vein through the popliteal and proximal calf veins. COMPARISON:  None. FINDINGS: VENOUS Normal compressibility of the common femoral, superficial femoral, and popliteal veins, as well as the visualized calf veins. Visualized portions of profunda femoral vein and great saphenous vein unremarkable. No filling defects to suggest DVT on grayscale or color Doppler imaging. Doppler waveforms show normal direction of venous flow, normal respiratory plasticity and response to augmentation. OTHER None. Limitations: Calf veins not well visualized due to edema. IMPRESSION: No lower extremity DVT. Limited assessment of the calf veins due to edema. Electronically Signed   By: Miachel Roux M.D.   On: 07/21/2021 14:55   ECHOCARDIOGRAM COMPLETE  Result Date: 07/22/2021    ECHOCARDIOGRAM REPORT   Patient Name:   SHALETHA HUMBLE [facility] Date of Exam: 07/21/2021 Medical Rec #:  [id number]          Height:       61.0 in Accession #:    [id number]         Weight:       195.0 lb Date of Birth:  03/11/1946         BSA:          1.869 m Patient Age:    40 years           BP:           103/78 mmHg Patient Gender: F                  HR:           66 bpm. Exam Location:  [facility] Procedure: 2D Echo, Cardiac Doppler and Color Doppler Indications:     L27.51 Acute Systolic CHF  History:         Patient has no prior history of Echocardiogram examinations.                  Risk Factors:Diabetes, Dyslipidemia and Hypertension. Sleep                  apnea. Parkinson's Disease. Dysrhythmia. Hypothyroidism.                  Syncope.  Sonographer:     Cresenciano Lick RDCS  Referring Phys:  [id number] Rhetta Mura Diagnosing Phys: Yolonda Kida MD IMPRESSIONS  1. Left ventricular ejection fraction, by estimation, is 25 to 30%. The left ventricle has severely decreased function. The left ventricle demonstrates global hypokinesis. The left ventricular internal cavity size was moderately dilated. Left ventricular diastolic parameters were normal.  2. Right ventricular  systolic function is mildly reduced. The right ventricular size is moderately enlarged.  3. Left atrial size was moderately dilated.  4. Right atrial size was mild to moderately dilated.  5. The mitral valve is grossly normal. Severe mitral valve regurgitation.  6. Tricuspid valve regurgitation is severe.  7. The aortic valve is grossly normal. Aortic valve regurgitation is mild. Mild aortic valve sclerosis is present, with no evidence of aortic valve stenosis. FINDINGS  Left Ventricle: Left ventricular ejection fraction, by estimation, is 25 to 30%. The left ventricle has severely decreased function. The left ventricle demonstrates global hypokinesis. The left ventricular internal cavity size was moderately dilated. There is no left ventricular hypertrophy. Left ventricular diastolic parameters were normal. Right Ventricle: The right ventricular size is moderately enlarged. No increase in right ventricular wall thickness. Right ventricular systolic function is mildly reduced. Left Atrium: Left atrial size was moderately dilated. Right Atrium: Right atrial size was mild to moderately dilated. Pericardium: There is no evidence of pericardial effusion. Mitral Valve: The mitral valve is grossly normal. There is moderate thickening of the mitral valve leaflet(s). Severe mitral valve regurgitation. Tricuspid Valve: The tricuspid valve is grossly normal. Tricuspid valve regurgitation is severe. Aortic Valve: The aortic valve is grossly normal. Aortic valve regurgitation is mild. Aortic regurgitation PHT measures 522 msec.  Mild aortic valve sclerosis is present, with no evidence of aortic valve stenosis. Pulmonic Valve: The pulmonic valve was normal in structure. Pulmonic valve regurgitation is trivial. Aorta: The ascending aorta was not well visualized. IAS/Shunts: No atrial level shunt detected by color flow Doppler.  LEFT VENTRICLE PLAX 2D LVIDd:         5.40 cm  Diastology LVIDs:         4.70 cm  LV e' medial:    4.03 cm/s LV PW:         0.90 cm  LV E/e' medial:  24.4 LV IVS:        0.70 cm  LV e' lateral:   10.60 cm/s LVOT diam:     1.70 cm  LV E/e' lateral: 9.3 LV SV:         31 LV SV Index:   17 LVOT Area:     2.27 cm  RIGHT VENTRICLE            IVC RV Basal diam:  3.50 cm    IVC diam: 2.30 cm RV S prime:     9.03 cm/s TAPSE (M-mode): 1.2 cm LEFT ATRIUM             Index       RIGHT ATRIUM           Index LA diam:        5.30 cm 2.84 cm/m  RA Area:     18.40 cm LA Vol (A2C):   93.4 ml 49.99 ml/m RA Volume:   53.40 ml  28.58 ml/m LA Vol (A4C):   81.6 ml 43.67 ml/m LA Biplane Vol: 87.6 ml 46.88 ml/m  AORTIC VALVE LVOT Vmax:   64.20 cm/s LVOT Vmean:  51.200 cm/s LVOT VTI:    0.136 m AI PHT:      522 msec  AORTA Ao Root diam: 2.50 cm Ao Asc diam:  3.40 cm MITRAL VALVE               TRICUSPID VALVE MV Area (PHT): 4.31 cm    TR Peak grad:   38.9 mmHg MV Decel Time: 176 msec    TR  Vmax:        312.00 cm/s MV E velocity: 98.50 cm/s MV A velocity: 91.30 cm/s  SHUNTS MV E/A ratio:  1.08        Systemic VTI:  0.14 m                            Systemic Diam: 1.70 cm Dwayne D Callwood MD Electronically signed by Yolonda Kida MD Signature Date/Time: 07/22/2021/12:58:29 PM    Final         Scheduled Meds:  allopurinol  100 mg Oral Daily   amantadine  100 mg Oral BID   carbidopa-levodopa  1 tablet Oral QHS   carbidopa-levodopa  2.5 tablet Oral QID   docusate sodium  100 mg Oral BID   enoxaparin (LOVENOX) injection  40 mg Subcutaneous QHS   ferrous sulfate  325 mg Oral Q breakfast   furosemide  40 mg Intravenous Q12H    gabapentin  300 mg Oral QHS   insulin aspart  0-9 Units Subcutaneous TID WC   levothyroxine  75 mcg Oral Q0600   losartan  50 mg Oral Daily   metoprolol succinate  50 mg Oral Q breakfast   pravastatin  40 mg Oral q1800   Continuous Infusions:   LOS: 3 days    Time spent: 25 minutes    Sidney Ace, MD [facility] Pager [phone]  If 7PM-7AM, please contact night-coverage 07/23/2021, 11:45 AM

## 2021-07-23 NOTE — Care Management Important Message (Signed)
Important Message  Patient Details  Name: Stephanie [name] MRN: [id number] Date of Birth: 1945/11/26   Medicare Important Message Given:  Yes     Dannette Barbara 07/23/2021, 2:13 PM

## 2021-07-23 NOTE — Evaluation (Signed)
Clinical/Bedside Swallow Evaluation Patient Details  Name: Stephanie [name] MRN: [id number] Date of Birth: [birth year]  Today's Date: 07/23/2021 Time: SLP Start Time (ACUTE ONLY): 5852 SLP Stop Time (ACUTE ONLY): 1010 SLP Time Calculation (min) (ACUTE ONLY): 45 min  Past Medical History:  Past Medical History:  Diagnosis Date   Anemia    Arthritis    Chronic back pain    Diabetes mellitus without complication (HCC)    Dysrhythmia    GERD (gastroesophageal reflux disease)    Hyperlipidemia    Hypertension    Hypothyroid    Multilevel degenerative disc disease    Parkinson's disease ([place])    Sleep apnea    Syncope    Tremors of nervous system    Wears dentures    full upper and lower   Past Surgical History:  Past Surgical History:  Procedure Laterality Date   ABDOMINAL HYSTERECTOMY     BACK SURGERY  09/16   CARDIAC CATHETERIZATION     CATARACT EXTRACTION W/PHACO Right 11/24/2015   Procedure: CATARACT EXTRACTION PHACO AND INTRAOCULAR LENS PLACEMENT ([place]);  Surgeon: Estill Cotta, MD;  Location: [facility];  Service: Ophthalmology;  Laterality: Right;  [place] 01:14 AP% 25.5 CDE 30.69 fluid pack lot # [id number]   CATARACT EXTRACTION W/PHACO Left 01/06/2021   Procedure: CATARACT EXTRACTION PHACO AND INTRAOCULAR LENS PLACEMENT ([facility]) LEFT DIABETIC 6.98 00:40.1;  Surgeon: Birder Robson, MD;  Location: [place];  Service: Ophthalmology;  Laterality: Left;  Diabetic - oral meds   COLONOSCOPY     COLONOSCOPY WITH PROPOFOL N/A 07/24/2019   Procedure: COLONOSCOPY WITH PROPOFOL;  Surgeon: Toledo, Benay Pike, MD;  Location: [facility];  Service: Gastroenterology;  Laterality: N/A;   ESOPHAGOGASTRODUODENOSCOPY N/A 07/24/2019   Procedure: ESOPHAGOGASTRODUODENOSCOPY (EGD);  Surgeon: Toledo, Benay Pike, MD;  Location: [facility];  Service: Gastroenterology;  Laterality: N/A;   OOPHORECTOMY     HPI:  Pt is a 75 y.o. female with a past medical history of anemia, diabetes,  gastric reflux, hypertension, hyperlipidemia, CHF, Parkinson's Dis, Tremors, who presents to the emergency department from her cardiology office for fluid overload.  According to the patient and her daughter patient has a history of CHF has had worsening fluid accumulation with lower extremity edema and now weepage.  Worsening shortness of breath that has acutely worsened over the past 2 to 3 days.  Patient was seen by Dr. Nehemiah Massed of cardiology today, they had increased her Lasix on Saturday but have not noted any improvement in the patient's presented to the emergency department today.  Here the patient is satting around 88% on room air, she does wear 2 L of oxygen if needed at home which she does typically not needed but has needed recently.  Patient satting in the mid 90s on 2 L.  Denies any chest pain.  Patient does have bruising to her face which she states was from a fall approximately 2 weeks ago, she is already been worked up for the fall.  Pt has Significant c/o Esophageal Dysmotility; EGD in 2020 revealed: "Patchy moderate inflammation characterized by erosions and  erythema was found in the gastric body and in the gastric antrum".    CXR this admit: "Stable cardiomegaly with mild, stable bibasilar linear scarring  and/or atelectasis.".    Assessment / Plan / Recommendation  Clinical Impression  Pt appeared to present w/ inconsistent, overt clinicial s/s of aspiration w/ trials of thin liquids via cup and straw; pharyngeal phase deficits. At the same time, pt reported discomfort  from Esophageal Dysmotility (even w/ liquids). Pt stated she has been living w/ Esophageal Dysmotility for "several years now" and endorsed episodes of Regurgitation of mucous and liquid/foods. Noted EGD report in 2020 w/ Dilation then and "patchy moderate inflammation characterized by erosions and  erythema was found in the gastric body and in the gastric antrum". Pt was d/t have a DG Esophagus this admit.   Unsure if pt's  potential pharyngeal phase deficits are impacted by her Baseline Esophageal Dysmotility and GERD, or d/t impact from Baseline dx of Parkinson's Dis. Oral phase bolus management appeared [facility] even w/out lower Dentition(upper Dentures only). Pt fed self w/ setup support. OM Exam appeared grossly [facility] for strength/ROM.  A MBSS is recommended at this time to further assess oropharyngeal phase swallowing and risk for aspiration prior to DG Esophagus and meals. MD and NSG consulted. MBSS ordered. SLP Visit Diagnosis: Dysphagia, pharyngoesophageal phase (R13.14)    Aspiration Risk  Mild aspiration risk;Risk for inadequate nutrition/hydration    Diet Recommendation   TBD  Medication Administration: Whole meds with puree    Other  Recommendations Recommended Consults: Consider GI evaluation;Consider esophageal assessment (Dietician f/u) Oral Care Recommendations: Oral care BID;Oral care before and after PO;Patient independent with oral care Other Recommendations:  (TBD)    Recommendations for follow up therapy are one component of a multi-disciplinary discharge planning process, led by the attending physician.  Recommendations may be updated based on patient status, additional functional criteria and insurance authorization.  Follow up Recommendations  (TBD)      Frequency and Duration            Prognosis Prognosis for Safe Diet Advancement: Fair Barriers to Reach Goals: Time post onset;Severity of deficits Barriers/Prognosis Comment: Parkinson's dis      Swallow Study   General Date of Onset: 07/20/21 HPI: Pt is a 75 y.o. female with a past medical history of anemia, diabetes, gastric reflux, hypertension, hyperlipidemia, CHF, Parkinson's Dis, Tremors, who presents to the emergency department from her cardiology office for fluid overload.  According to the patient and her daughter patient has a history of CHF has had worsening fluid accumulation with lower extremity edema and now weepage.   Worsening shortness of breath that has acutely worsened over the past 2 to 3 days.  Patient was seen by Dr. Nehemiah Massed of cardiology today, they had increased her Lasix on Saturday but have not noted any improvement in the patient's presented to the emergency department today.  Here the patient is satting around 88% on room air, she does wear 2 L of oxygen if needed at home which she does typically not needed but has needed recently.  Patient satting in the mid 90s on 2 L.  Denies any chest pain.  Patient does have bruising to her face which she states was from a fall approximately 2 weeks ago, she is already been worked up for the fall.  Pt has Significant c/o Esophageal Dysmotility; EGD in 2020 revealed: "Patchy moderate inflammation characterized by erosions and  erythema was found in the gastric body and in the gastric antrum".    CXR this admit: "Stable cardiomegaly with mild, stable bibasilar linear scarring  and/or atelectasis.". Type of Study: Bedside Swallow Evaluation Previous Swallow Assessment: previous Outpt tx for Parkinson's Dis. -- LOUD tx Diet Prior to this Study: Regular;Thin liquids Temperature Spikes Noted: No (wbc 6.5) Respiratory Status: Nasal cannula (2L) History of Recent Intubation: No Behavior/Cognition: Alert;Cooperative;Pleasant mood Oral Cavity Assessment: Within Functional Limits Oral Care Completed  by SLP: Recent completion by staff Oral Cavity - Dentition: Dentures, top (no bottom dentures) Vision: Functional for self-feeding Self-Feeding Abilities: Able to feed self;Needs set up;Needs assist (tremors) Patient Positioning: Upright in bed;Postural control adequate for testing Baseline Vocal Quality: Normal Volitional Cough: Strong Volitional Swallow: Able to elicit    Oral/Motor/Sensory Function Overall Oral Motor/Sensory Function: Within functional limits (tongue discolored - blue(moreso on Left side))   Ice Chips Ice chips: Within functional limits Presentation:  Spoon (fed; 2 trials)   Thin Liquid Thin Liquid: Impaired Presentation: Cup;Self Fed;Straw (10+ trials) Oral Phase Impairments:  (none) Pharyngeal  Phase Impairments: Cough - Delayed (inconsistent)    Nectar Thick Nectar Thick Liquid: Not tested   Honey Thick Honey Thick Liquid: Not tested   Puree Puree: Within functional limits Presentation: Spoon;Self Fed (5 trials)   Solid     Solid: Not tested         Orinda Kenner, MS, CCC-SLP Speech Language Pathologist Rehab Services [phone] Shawonda Kerce 07/23/2021,11:19 AM

## 2021-07-24 ENCOUNTER — Inpatient Hospital Stay: Payer: Medicare Other

## 2021-07-24 ENCOUNTER — Encounter: Payer: Self-pay | Admitting: Internal Medicine

## 2021-07-24 DIAGNOSIS — I5023 Acute on chronic systolic (congestive) heart failure: Secondary | ICD-10-CM | POA: Diagnosis not present

## 2021-07-24 LAB — BASIC METABOLIC PANEL
Anion gap: 8 (ref 5–15)
BUN: 12 mg/dL (ref 8–23)
CO2: 33 mmol/L — ABNORMAL HIGH (ref 22–32)
Calcium: 8.1 mg/dL — ABNORMAL LOW (ref 8.9–10.3)
Chloride: 92 mmol/L — ABNORMAL LOW (ref 98–111)
Creatinine, Ser: 0.61 mg/dL (ref 0.44–1.00)
GFR, Estimated: >60 mL/min (ref >60)
Glucose, Bld: 116 mg/dL — ABNORMAL HIGH (ref 70–99)
Potassium: 3.8 mmol/L (ref 3.5–5.1)
Sodium: 133 mmol/L — ABNORMAL LOW (ref 135–145)

## 2021-07-24 LAB — URINALYSIS, COMPLETE (UACMP) WITH MICROSCOPIC
Bilirubin Urine: NEGATIVE
Glucose, UA: NEGATIVE mg/dL
Hgb urine dipstick: NEGATIVE
Ketones, ur: 5 mg/dL — AB
Nitrite: NEGATIVE
Protein, ur: NEGATIVE mg/dL
Specific Gravity, Urine: 1.014 (ref 1.005–1.030)
pH: 6 (ref 5.0–8.0)

## 2021-07-24 LAB — GLUCOSE, CAPILLARY
Glucose-Capillary: 164 mg/dL — ABNORMAL HIGH (ref 70–99)
Glucose-Capillary: 183 mg/dL — ABNORMAL HIGH (ref 70–99)
Glucose-Capillary: 208 mg/dL — ABNORMAL HIGH (ref 70–99)
Glucose-Capillary: 207 mg/dL — ABNORMAL HIGH (ref 70–99)

## 2021-07-24 LAB — MAGNESIUM: Magnesium: 1.9 mg/dL (ref 1.7–2.4)

## 2021-07-24 MED ORDER — MAGNESIUM SULFATE 2 GM/50ML IV SOLN
2.0000 g | Freq: Once | INTRAVENOUS | Status: AC
  Administered 2021-07-24: 2 g via INTRAVENOUS
  Filled 2021-07-24: qty 50

## 2021-07-24 MED ORDER — FUROSEMIDE 10 MG/ML IJ SOLN
INTRAMUSCULAR | Status: AC
  Administered 2021-07-24: 40 mg via INTRAVENOUS
  Filled 2021-07-24: qty 4

## 2021-07-24 MED ORDER — IOHEXOL 350 MG/ML SOLN
75.0000 mL | Freq: Once | INTRAVENOUS | Status: AC | PRN
  Administered 2021-07-24: 75 mL via INTRAVENOUS

## 2021-07-24 MED ORDER — METHOCARBAMOL 500 MG PO TABS
500.0000 mg | ORAL_TABLET | Freq: Three times a day (TID) | ORAL | Status: DC
  Administered 2021-07-24 – 2021-07-28 (×14): 500 mg via ORAL
  Filled 2021-07-24 (×15): qty 1

## 2021-07-24 MED ORDER — FUROSEMIDE 10 MG/ML IJ SOLN
60.0000 mg | Freq: Two times a day (BID) | INTRAMUSCULAR | Status: DC
  Administered 2021-07-24 – 2021-07-28 (×8): 60 mg via INTRAVENOUS
  Filled 2021-07-24 (×7): qty 6

## 2021-07-24 MED ORDER — POTASSIUM CHLORIDE CRYS ER 20 MEQ PO TBCR
40.0000 meq | EXTENDED_RELEASE_TABLET | Freq: Once | ORAL | Status: AC
  Administered 2021-07-24: 40 meq via ORAL
  Filled 2021-07-24: qty 4

## 2021-07-24 MED ORDER — IPRATROPIUM-ALBUTEROL 0.5-2.5 (3) MG/3ML IN SOLN
3.0000 mL | RESPIRATORY_TRACT | Status: DC
  Administered 2021-07-24 – 2021-07-25 (×4): 3 mL via RESPIRATORY_TRACT
  Filled 2021-07-24 (×5): qty 3

## 2021-07-24 NOTE — Progress Notes (Signed)
Physical Therapy Treatment Patient Details Name: Stephanie [name] MRN: [id number] DOB: 09/07/1946 Today's Date: 07/24/2021   History of Present Illness 75 year old female patient with PMH significant for chronic systolic CHF (most recent EF = 40-45%), LBBB, hypertension, hyperlipidemia, diabetes mellitus type 2, chronic iron deficiency anemia, Parkinson's disease and obesity who presented to the ER due to worsening shortness of breath and found to be in CHF exacerbation.    PT Comments    Pt in bed upon arrival ready for PT pt on O2 throughout session, noted continuous SpO2 sensor reading falsely low, compared with dinamap multiple times, pt also without any frank dizziness or dyspnea. Pt performs 10-15 minutes of standing self-guided exercises using the bedrails as a railing for balance. Pt able to sit independently in guest chair which is more comfortable for her. Pt then AMB around the unit 245ft with RW, steady pace. Pt left up in chair with alarm armed, call-bell nearby.    Recommendations for follow up therapy are one component of a multi-disciplinary discharge planning process, led by the attending physician.  Recommendations may be updated based on patient status, additional functional criteria and insurance authorization.  Follow Up Recommendations  Home health PT     Equipment Recommendations  None recommended by PT    Recommendations for Other Services       Precautions / Restrictions Precautions Precautions: Fall Precaution Comments: multiple falls PTA, oob often;     Mobility  Bed Mobility Overal bed mobility: Needs Assistance Bed Mobility: Supine to Sit;Sit to Supine     Supine to sit: Min assist          Transfers Overall transfer level: Modified independent Equipment used: Rolling walker (2 wheeled)                Ambulation/Gait Ambulation/Gait assistance: Supervision Gait Distance (Feet): 240 Feet Assistive device: Rolling walker (2  wheeled)       General Gait Details: O2 driven by author.   Stairs             Wheelchair Mobility    Modified Rankin (Stroke Patients Only)       Balance                                            Cognition Arousal/Alertness: Awake/alert Behavior During Therapy: WFL for tasks assessed/performed Overall Cognitive Status: Within Functional Limits for tasks assessed                                        Exercises Other Exercises Other Exercises: 10 minutes of standing exercises with RW but mostly bed rails elevated to chest height- asking to perform her standing ROM routine as per home, supervision provided, monitoring of vitals, O2 flow adjustments as needed.    General Comments        Pertinent Vitals/Pain Pain Assessment: No/denies pain    Home Living                      Prior Function            PT Goals (current goals can now be found in the care plan section) Acute Rehab PT Goals Patient Stated Goal: go back to daughter's home PT Goal Formulation: With patient Time For  Goal Achievement: 08/04/21 Potential to Achieve Goals: Good Progress towards PT goals: Progressing toward goals    Frequency    Min 2X/week      PT Plan Current plan remains appropriate    Co-evaluation              AM-PAC PT "6 Clicks" Mobility   Outcome Measure  Help needed turning from your back to your side while in a flat bed without using bedrails?: A Little Help needed moving from lying on your back to sitting on the side of a flat bed without using bedrails?: A Lot Help needed moving to and from a bed to a chair (including a wheelchair)?: A Little Help needed standing up from a chair using your arms (e.g., wheelchair or bedside chair)?: A Little Help needed to walk in hospital room?: A Little Help needed climbing 3-5 steps with a railing? : A Lot 6 Click Score: 16    End of Session Equipment Utilized During  Treatment: Gait belt;Oxygen Activity Tolerance: Patient tolerated treatment well;No increased pain Patient left: with call bell/phone within reach;with nursing/sitter in room;in chair;with chair alarm set Nurse Communication: Mobility status PT Visit Diagnosis: Muscle weakness (generalized) (M62.81);Difficulty in walking, not elsewhere classified (R26.2);Unsteadiness on feet (R26.81)     Time: 3833-3832 PT Time Calculation (min) (ACUTE ONLY): 45 min  Charges:  $Therapeutic Exercise: 38-52 mins                    4:00 PM, 07/24/21 Etta Grandchild, PT, DPT Physical Therapist - [facility]  [phone] ([place])     [place] C 07/24/2021, 3:56 PM

## 2021-07-24 NOTE — Progress Notes (Signed)
PT Cancellation Note  Patient Details Name: Stephanie [name] MRN: [id number] DOB: [birth year]   Cancelled Treatment:    Reason Eval/Treat Not Completed: Pain limiting ability to participate (Pt reports pain, anxiety, exhaustion limiting her motivation to participate at present, agreeable to attempt at later date/time.)  10:33 AM, 07/24/21 Etta Grandchild, PT, DPT Physical Therapist - [facility]  [phone] ([place])     [place] C 07/24/2021, 10:33 AM

## 2021-07-24 NOTE — Progress Notes (Signed)
[facility] Cardiology  Patient Description: Stephanie [name] is a 75 year old female patient with PMH significant for chronic systolic CHF (most recent EF = 40-45%), LBBB, hypertension, hyperlipidemia, diabetes mellitus type 2, chronic iron deficiency anemia, Parkinson's disease and obesity who presented to the ER due to worsening shortness of breath and found to be in CHF exacerbation, thus Cardiology was consulted.   SUBJECTIVE: The patient reports to have back pain and dyspnea on today. She states that she cannot get comfortable in the bed which is causing her to have back pain and dyspnea. The patient denies having any chest pain at this time. She reports that she is also feeling nauseous with movement and having some abdominal pain.   (07/23/2021): The patient reports to be feeling well on today; however, she reports that she did not sleep well on last night. The patient denies having any cardiac complaints at this time and states that her breathing is significantly better on today.   (07/22/2021): Today the patient reports to be feeling slightly better since yesterday. She reports that she continues to have some dyspnea, but states that it has improved. The patient continues to deny having any chest pain, dizziness or palpitations at this time.   OBJECTIVE: The patient continues to appear chronically ill. She continues to have increased effort of breathing and remains on supplemental oxygen with normal satruation levels. The patient continues to have SR with LBBB on the telemetry monitor and her HR is stable. The patient's vss, however, her blood work is remarkable for electrolyte imbalances, in addition to worsening BUN/creatinine. The patient's abdomen appears very edematous on today which is a new assessment on today. She has active bowel sound and some mild tenderness.   (07/23/2021): The patient appears chronically ill, but stable on today. Bedside telemetry continues to reveal LBBB. She continues to be  on supplemental o2 at 2L via [place] at this time with normal oxygen saturation. The patient's vss. She continues to be hypervolemic; however, it appears as if she is responding well to the IV diuresis upon examination. I&O's are still unreliable. Echocardiogram is remarkable for severe LV dysfunction with estimated EF of 25-30% which is a significant change from previous echo in 11/2020.   (07/22/2021): The patient appears stable on today. Her effort of breathing is normal; however, her breath sounds continues to be diminished and she continues to be on supplemental oxygen at 2L via [place] with normal oxygen saturation. Bedside telemonitor reveals LBBB. Patient continues to appear hypervolemic with BLE edema with compression wraps in place. Her urine output is also decreased and the urine is dark in color. There are concerns over the accuracy of the I/O's. The patient is resting comfortably in stretch with rails up and is in NAD.   Vitals:   07/23/21 2002 07/23/21 2322 07/24/21 0449 07/24/21 1031  BP: (!) 120/105 118/60 (!) 153/81   Pulse: 74 82 67   Resp: 18 18 18    Temp: 98.2 F (36.8 C) 97.7 F (36.5 C) 97.9 F (36.6 C)   TempSrc:   Oral   SpO2: 96% 94% 96% 90%  Weight:   87.7 kg   Height:         Intake/Output Summary (Last 24 hours) at 07/24/2021 1137 Last data filed at 07/23/2021 2200 Gross per 24 hour  Intake 240 ml  Output 1800 ml  Net -1560 ml      PHYSICAL EXAM  General: Well developed, well nourished, in no acute distress HEENT:  Normocephalic and atramatic,  facial bruising noted to the right of the face Neck:   No JVD.  Lungs: Clear bilaterally to auscultation. Chest expansion symmetrical, no wheezes, rales or rhonchi Heart: HRRR . Normal S1 and S2 without gallops or murmurs.  Abdomen: Bowel sounds are positive, abdomen edematous and slightly tender.  Msk:  Normal strength and tone for age. Extremities: moderate, 2 pitting edema noted to the BLE with erythema. No clubbing or  cyanosis  Neuro: Alert and oriented X 3. Psych:  Good affect, responds appropriately   LABS: Basic Metabolic Panel: Recent Labs    07/23/21 0443 07/24/21 0445  NA 133* 133*  K 4.0 3.8  CL 92* 92*  CO2 31 33*  GLUCOSE 117* 116*  BUN 14 12  CREATININE 0.62 0.61  CALCIUM 7.9* 8.1*  MG 1.6* 1.9   Liver Function Tests: No results for input(s): AST, ALT, ALKPHOS, BILITOT, PROT, ALBUMIN in the last 72 hours.  No results for input(s): LIPASE, AMYLASE in the last 72 hours. CBC: No results for input(s): WBC, NEUTROABS, HGB, HCT, MCV, PLT in the last 72 hours.  Cardiac Enzymes: No results for input(s): CKTOTAL, CKMB, CKMBINDEX, TROPONINI in the last 72 hours. BNP: Invalid input(s): POCBNP D-Dimer: No results for input(s): DDIMER in the last 72 hours.  Hemoglobin A1C: No results for input(s): HGBA1C in the last 72 hours. Fasting Lipid Panel: No results for input(s): CHOL, HDL, LDLCALC, TRIG, CHOLHDL, LDLDIRECT in the last 72 hours. Thyroid Function Tests: No results for input(s): TSH, T4TOTAL, T3FREE, THYROIDAB in the last 72 hours.  Invalid input(s): FREET3 Anemia Panel: No results for input(s): VITAMINB12, FOLATE, FERRITIN, TIBC, IRON, RETICCTPCT in the last 72 hours.  DG Wrist Complete Right  Result Date: 07/23/2021 CLINICAL DATA:  Wrist pain following recent transportation move, initial encounter. EXAM: RIGHT WRIST - COMPLETE 3+ VIEW COMPARISON:  None. FINDINGS: Degenerative changes of the first [facility] joint and intercarpal joint are noted. A cystic lesion is noted in the distal ulna which appears benign in etiology. No acute fracture or dislocation is noted. Soft tissue swelling in the dorsum of the hand is seen. IMPRESSION: Degenerative change without acute abnormality. Mild soft tissue swelling is seen. Electronically Signed   By: Inez Catalina M.D.   On: 07/23/2021 16:24   DG Hand 2 View Right  Result Date: 07/23/2021 CLINICAL DATA:  Hand pain following recent  transportation move, initial encounter EXAM: RIGHT HAND - 2 VIEW COMPARISON:  None. FINDINGS: Degenerative changes of the first [facility] joint are noted. Mild carpal row degenerative changes are noted as well. No acute fracture or dislocation is seen. Mild soft tissue swelling is noted particularly over the dorsum of the hand. No other focal abnormality is noted. IMPRESSION: Soft tissue swelling without acute bony abnormality. Electronically Signed   By: Inez Catalina M.D.   On: 07/23/2021 16:23     Echo: results pending   TELEMETRY: LBBB  ASSESSMENT AND PLAN:  Principal Problem:   Acute on chronic systolic (congestive) heart failure (HCC) Active Problems:   SOB (shortness of breath)   Acute respiratory failure with hypoxia (HCC)   Diabetes mellitus without complication (HCC)   Hypothyroid   Parkinson's disease ([place])   Hyperlipidemia    Acute on chronic systolic CHF with reduced EF,  fairly stable at this time, patient appears hypervolemic at this time with dyspnea and BLE edema, Echocardiogram is remarkable for severe LV dysfunction with estimated EF of 25-30%  Continuous cardiac telemetry monitoring until discharge. Echocardiogram is remarkable for severe  LV dysfunction with estimated EF of 25-30% which is a significant change from previous echo in 11/2020.  Continue diuresis with IV Lasix.  Consider transitioning to oral torsemide therapy once patient is diuresed and stable.  We will continue Losartan 50mg  once daily. Recommend transitioning to entresto as outpatient.  Monitor strict I's and O's. Consider UA and Urine culture due to discoloration. Perform daily weights. Low-sodium, heart healthy diet. Recommend HF clinic referral.    2. Dyspnea, chronic, likely due to underlying CHF with exacerbation at this time Continue supplemental oxygen therapy. Consider RT evaluation.   3. Elevated D-dimer  CT negative for Pulmonary embolus.    4. HTN, reasonably controlled, patient is  normotensive at this time Continue management with ARB inhibitor and beta-blocker therapy. Monitor blood pressure per protocol.   5. HLD, reasonably stable Continue statin therapy.    6. DM2, fairly controlled Continue CBG monitoring and sliding scale insulin per protocol.   7. Electrolyte imbalance, hyponatremia and worsening kidney functioning Trend CMP. Recommend nephrology consult.  8. Abdominal pain and edema  -Recommend abdominal ultrasound for further evaluation.     [place], ACNPC-AG  07/24/2021 11:37 AM

## 2021-07-24 NOTE — Progress Notes (Signed)
Pt visited and reports toleration of lunch today. She reports some difficulty with meds when given two at a time. Reinforced recs from MBSS yesterday to include single small sips of liquid with f/u dry swallows. Meds one at a time in applesauce. Applesauce left in the room for Nsg to use.

## 2021-07-24 NOTE — Progress Notes (Signed)
PROGRESS NOTE    CLODAGH ODENTHAL  HWE:[id number] DOB: 01-31-1946 DOA: 07/20/2021 PCP: [name], MD    Brief Narrative:   Hx hfpef, last hospitalized for such at [facility] 11/2020, here with at least several weeks progressively worsening dyspnea on exertion, found to have decompensated heart failure.  CT angiography of the thorax ruled out PE however was mentioned density in esophagus possible impacted food bolus.  On interview patient states that she has had years of esophageal dysmotility.  Review of medical records significant for EGD and colonoscopy in 2020.  Patchy moderate inflammation characterized by erosions and erythema in gastric body and antrum.  Cardiology following.  Patient on diuresis.  Continues to be short of breath.  Has been diuresing.  Modified barium swallow demonstrates high risk for aspiration.  Per SLP do not recommend barium esophagram at this time.  Pursuing CT soft tissue neck with contrast for further evaluation of lesion seen on esophagus.  Assessment & Plan:   Principal Problem:   Acute on chronic systolic (congestive) heart failure (HCC) Active Problems:   SOB (shortness of breath)   Acute respiratory failure with hypoxia (HCC)   Diabetes mellitus without complication (HCC)   Hypothyroid   Parkinson's disease (HCC)   Hyperlipidemia  Acute on chronic systolic congestive heart failure Acute hypoxic respiratory failure pulmonary hypertension EF 1/22 at [facility] 55%, moderate pulmonary hypertension.  Repeat echocardiogram on 9/20 EF 25 to 30% Here bnp elevated to 900s, lower extremity edema, o2 in mid 80s improved to normal on 2 L.  Has intermittent need for o2 at home, usually at night.  Has received lasix here w/ symptomatic improvement.  Initial troponin neg, no chest pain.  Likely underlying osa.  Dry weight per daughter 74; 195 here. Dimer is elevated CT angio ruled out PE Plan: Continue Lasix 60 mg IV twice daily Recheck chest x-ray Check  urinalysis Monitor UOP Continue ACE inhibitor Daily weights Outpatient cardiology follow-up  Possible esophageal foreign body Unclear etiology, noted on CT angio SLP evaluation complete MBS S complete Patient high risk for aspiration, do not recommend barium swallow at this time Pursue CT soft tissue neck with contrast   HTN Here bp wnl - diuresis as above - home metop, enalapril, statin   Parkinsons - home sinimet, amantadine   Gout - home allopurinol   T2DM Here glucose wnl - hold home pio and met - SSI   Hypothyroid - home synthroid   DVT prophylaxis: SQ Lovenox Code Status: Full Family Communication: None today Disposition Plan: Status is: Inpatient  Remains inpatient appropriate because:Inpatient level of care appropriate due to severity of illness  Dispo: The patient is from: Home              Anticipated d/c is to: Home              Patient currently is not medically stable to d/c.   Difficult to place patient No       Level of care: Med-Surg  Consultants:  Cardiology  Procedures:  None  Antimicrobials:  None   Subjective: Patient seen and examined.  Continues to endorse shortness of breath and back pain.  Objective: Vitals:   07/23/21 2002 07/23/21 2322 07/24/21 0449 07/24/21 1031  BP: (!) 120/105 118/60 (!) 153/81   Pulse: 74 82 67   Resp: '18 18 18   ' Temp: 98.2 F (36.8 C) 97.7 F (36.5 C) 97.9 F (36.6 C)   TempSrc:   Oral   SpO2:  96% 94% 96% 90%  Weight:   87.7 kg   Height:        Intake/Output Summary (Last 24 hours) at 07/24/2021 1149 Last data filed at 07/23/2021 2200 Gross per 24 hour  Intake 240 ml  Output 1800 ml  Net -1560 ml   Filed Weights   07/20/21 1800 07/23/21 0500 07/24/21 0449  Weight: 88.5 kg 87.9 kg 87.7 kg    Examination:  General exam: Appears fatigued Respiratory system: Bibasilar crackles.  Normal work of breathing.  2 L Cardiovascular system: S1-S2, RRR, no murmurs, no pedal  edema Gastrointestinal system: Soft, mild distention, positive bowel sounds Central nervous system: Alert and oriented. No focal neurological deficits. Extremities: Symmetric 5 x 5 power. Skin: No rashes, lesions or ulcers Psychiatry: Judgement and insight appear normal. Mood & affect appropriate.     Data Reviewed: I have personally reviewed following labs and imaging studies  CBC: Recent Labs  Lab 07/20/21 1824 07/21/21 0737  WBC 6.6 6.5  NEUTROABS 4.6  --   HGB 12.0 11.3*  HCT 38.9 38.0  MCV 83.3 83.7  PLT 270 740   Basic Metabolic Panel: Recent Labs  Lab 07/20/21 1824 07/21/21 0737 07/22/21 0610 07/23/21 0443 07/24/21 0445  NA 138 142 136 133* 133*  K 3.6 3.6 3.8 4.0 3.8  CL 100 97* 94* 92* 92*  CO2 31 36* 33* 31 33*  GLUCOSE 212* 134* 125* 117* 116*  BUN 25* '18 16 14 12  ' CREATININE 0.82 0.71 0.59 0.62 0.61  CALCIUM 8.4* 8.3* 8.2* 7.9* 8.1*  MG 1.5* 1.5* 2.1 1.6* 1.9  PHOS  --  3.2  --   --   --    GFR: Estimated Creatinine Clearance: 62.1 mL/min (by C-G formula based on SCr of 0.61 mg/dL). Liver Function Tests: Recent Labs  Lab 07/20/21 1824 07/21/21 0737  AST 22 21  ALT 7 5  ALKPHOS 103 97  BILITOT 1.3* 1.3*  PROT 7.1 7.0  ALBUMIN 3.2* 2.7*   No results for input(s): LIPASE, AMYLASE in the last 168 hours. No results for input(s): AMMONIA in the last 168 hours. Coagulation Profile: Recent Labs  Lab 07/21/21 0737  INR 1.2   Cardiac Enzymes: No results for input(s): CKTOTAL, CKMB, CKMBINDEX, TROPONINI in the last 168 hours. BNP (last 3 results) No results for input(s): PROBNP in the last 8760 hours. HbA1C: No results for input(s): HGBA1C in the last 72 hours. CBG: Recent Labs  Lab 07/23/21 0753 07/23/21 1150 07/23/21 1645 07/23/21 2054 07/24/21 0908  GLUCAP 126* 193* 138* 182* 164*   Lipid Profile: No results for input(s): CHOL, HDL, LDLCALC, TRIG, CHOLHDL, LDLDIRECT in the last 72 hours. Thyroid Function Tests: No results for  input(s): TSH, T4TOTAL, FREET4, T3FREE, THYROIDAB in the last 72 hours. Anemia Panel: No results for input(s): VITAMINB12, FOLATE, FERRITIN, TIBC, IRON, RETICCTPCT in the last 72 hours. Sepsis Labs: Recent Labs  Lab 07/20/21 1824  PROCALCITON <0.10    Recent Results (from the past 240 hour(s))  Resp Panel by RT-PCR (Flu A&B, Covid) Nasopharyngeal Swab     Status: None   Collection Time: 07/20/21  9:35 PM   Specimen: Nasopharyngeal Swab; Nasopharyngeal(NP) swabs in vial transport medium  Result Value Ref Range Status   SARS Coronavirus 2 by RT PCR NEGATIVE NEGATIVE Final    Comment: (NOTE) SARS-CoV-2 target nucleic acids are NOT DETECTED.  The SARS-CoV-2 RNA is generally detectable in upper respiratory specimens during the acute phase of infection. The lowest concentration of SARS-CoV-2  viral copies this assay can detect is 138 copies/mL. A negative result does not preclude SARS-Cov-2 infection and should not be used as the sole basis for treatment or other patient management decisions. A negative result may occur with  improper specimen collection/handling, submission of specimen other than nasopharyngeal swab, presence of viral mutation(s) within the areas targeted by this assay, and inadequate number of viral copies(<138 copies/mL). A negative result must be combined with clinical observations, patient history, and epidemiological information. The expected result is Negative.  Fact Sheet for Patients:  EntrepreneurPulse.com.au  Fact Sheet for Healthcare Providers:  IncredibleEmployment.be  This test is no t yet approved or cleared by the [place] FDA and  has been authorized for detection and/or diagnosis of SARS-CoV-2 by FDA under an Emergency Use Authorization (EUA). This EUA will remain  in effect (meaning this test can be used) for the duration of the COVID-19 declaration under Section 564(b)(1) of the Act, 21 U.S.C.section  360bbb-3(b)(1), unless the authorization is terminated  or revoked sooner.       Influenza A by PCR NEGATIVE NEGATIVE Final   Influenza B by PCR NEGATIVE NEGATIVE Final    Comment: (NOTE) The Xpert Xpress SARS-CoV-2/FLU/RSV plus assay is intended as an aid in the diagnosis of influenza from Nasopharyngeal swab specimens and should not be used as a sole basis for treatment. Nasal washings and aspirates are unacceptable for Xpert Xpress SARS-CoV-2/FLU/RSV testing.  Fact Sheet for Patients: EntrepreneurPulse.com.au  Fact Sheet for Healthcare Providers: IncredibleEmployment.be  This test is not yet approved or cleared by the [place] FDA and has been authorized for detection and/or diagnosis of SARS-CoV-2 by FDA under an Emergency Use Authorization (EUA). This EUA will remain in effect (meaning this test can be used) for the duration of the COVID-19 declaration under Section 564(b)(1) of the Act, 21 U.S.C. section 360bbb-3(b)(1), unless the authorization is terminated or revoked.  Performed at [facility], [address], [place], [place] [address]          Radiology Studies: DG Wrist Complete Right  Result Date: 07/23/2021 CLINICAL DATA:  Wrist pain following recent transportation move, initial encounter. EXAM: RIGHT WRIST - COMPLETE 3+ VIEW COMPARISON:  None. FINDINGS: Degenerative changes of the first [facility] joint and intercarpal joint are noted. A cystic lesion is noted in the distal ulna which appears benign in etiology. No acute fracture or dislocation is noted. Soft tissue swelling in the dorsum of the hand is seen. IMPRESSION: Degenerative change without acute abnormality. Mild soft tissue swelling is seen. Electronically Signed   By: Inez Catalina M.D.   On: 07/23/2021 16:24   DG Hand 2 View Right  Result Date: 07/23/2021 CLINICAL DATA:  Hand pain following recent transportation move, initial encounter EXAM: RIGHT HAND -  2 VIEW COMPARISON:  None. FINDINGS: Degenerative changes of the first [facility] joint are noted. Mild carpal row degenerative changes are noted as well. No acute fracture or dislocation is seen. Mild soft tissue swelling is noted particularly over the dorsum of the hand. No other focal abnormality is noted. IMPRESSION: Soft tissue swelling without acute bony abnormality. Electronically Signed   By: Inez Catalina M.D.   On: 07/23/2021 16:23        Scheduled Meds:  allopurinol  100 mg Oral Daily   amantadine  100 mg Oral BID   carbidopa-levodopa  1 tablet Oral QHS   carbidopa-levodopa  2.5 tablet Oral QID   docusate sodium  100 mg Oral BID   enoxaparin (  LOVENOX) injection  40 mg Subcutaneous QHS   ferrous sulfate  325 mg Oral Q breakfast   fluticasone  1 spray Each Nare Daily   furosemide  60 mg Intravenous Q12H   gabapentin  300 mg Oral QHS   insulin aspart  0-9 Units Subcutaneous TID WC   ipratropium-albuterol  3 mL Nebulization Q4H   levothyroxine  75 mcg Oral Q0600   losartan  50 mg Oral Daily   methocarbamol  500 mg Oral TID   metoprolol succinate  50 mg Oral Q breakfast   pravastatin  40 mg Oral q1800   Continuous Infusions:   LOS: 4 days    Time spent: 25 minutes    Sidney Ace, MD [facility] Pager [phone]  If 7PM-7AM, please contact night-coverage 07/24/2021, 11:49 AM

## 2021-07-25 DIAGNOSIS — I5023 Acute on chronic systolic (congestive) heart failure: Secondary | ICD-10-CM | POA: Diagnosis not present

## 2021-07-25 LAB — BASIC METABOLIC PANEL
Anion gap: 8 (ref 5–15)
BUN: 17 mg/dL (ref 8–23)
CO2: 35 mmol/L — ABNORMAL HIGH (ref 22–32)
Calcium: 8.3 mg/dL — ABNORMAL LOW (ref 8.9–10.3)
Chloride: 92 mmol/L — ABNORMAL LOW (ref 98–111)
Creatinine, Ser: 0.59 mg/dL (ref 0.44–1.00)
GFR, Estimated: >60 mL/min (ref >60)
Glucose, Bld: 139 mg/dL — ABNORMAL HIGH (ref 70–99)
Potassium: 4.1 mmol/L (ref 3.5–5.1)
Sodium: 135 mmol/L (ref 135–145)

## 2021-07-25 LAB — GLUCOSE, CAPILLARY
Glucose-Capillary: 146 mg/dL — ABNORMAL HIGH (ref 70–99)
Glucose-Capillary: 272 mg/dL — ABNORMAL HIGH (ref 70–99)
Glucose-Capillary: 269 mg/dL — ABNORMAL HIGH (ref 70–99)
Glucose-Capillary: 220 mg/dL — ABNORMAL HIGH (ref 70–99)

## 2021-07-25 LAB — MAGNESIUM: Magnesium: 1.8 mg/dL (ref 1.7–2.4)

## 2021-07-25 MED ORDER — IPRATROPIUM-ALBUTEROL 0.5-2.5 (3) MG/3ML IN SOLN
3.0000 mL | Freq: Four times a day (QID) | RESPIRATORY_TRACT | Status: DC
  Administered 2021-07-25 – 2021-07-28 (×10): 3 mL via RESPIRATORY_TRACT
  Filled 2021-07-25 (×11): qty 3

## 2021-07-25 MED ORDER — MAGNESIUM SULFATE 2 GM/50ML IV SOLN
2.0000 g | Freq: Once | INTRAVENOUS | Status: AC
  Administered 2021-07-25: 2 g via INTRAVENOUS
  Filled 2021-07-25: qty 50

## 2021-07-25 NOTE — Progress Notes (Signed)
[facility] Cardiology  Patient Description: Stephanie [name] is a 75 year old female patient with PMH significant for chronic systolic CHF (most recent EF = 40-45%), LBBB, hypertension, hyperlipidemia, diabetes mellitus type 2, chronic iron deficiency anemia, Parkinson's disease and obesity who presented to the ER due to worsening shortness of breath and found to be in CHF exacerbation, thus Cardiology was consulted.   Interval history:  - -1.12 L yesterday, but no input recorded. - Laying in bed. Has a multitue of complaints related to being uncomfortable in her bed.  - Breathing is improved, but still not good.  - She thinks her swelling is improved, but can't really assess this on her own.    Vitals:   07/25/21 0230 07/25/21 0500 07/25/21 0738 07/25/21 0744  BP: (!) 165/103   (!) 142/80  Pulse: 78   77  Resp: 16   18  Temp: (!) 97.5 F (36.4 C)   97.7 F (36.5 C)  TempSrc: Oral     SpO2: 94%  94% 100%  Weight:  86.7 kg    Height:         Intake/Output Summary (Last 24 hours) at 07/25/2021 2831 Last data filed at 07/25/2021 0500 Gross per 24 hour  Intake --  Output 1125 ml  Net -1125 ml       PHYSICAL EXAM  General: Well developed, well nourished, in no acute distress HEENT:  Normocephalic and atramatic, facial bruising noted to the right of the face Neck:   No JVD.  Lungs: Clear bilaterally to auscultation. Chest expansion symmetrical, no wheezes, rales or rhonchi Heart: HRRR . Normal S1 and S2 without gallops or murmurs.  Abdomen: Bowel sounds are positive, abdomen edematous and slightly tender.  Msk:  Normal strength and tone for age. Extremities: moderate, 2 pitting edema noted to the BLE with erythema. No clubbing or cyanosis  Neuro: Alert and oriented X 3. Psych:  Good affect, responds appropriately   LABS: Basic Metabolic Panel: Recent Labs    07/24/21 0445 07/25/21 0511  NA 133* 135  K 3.8 4.1  CL 92* 92*  CO2 33* 35*  GLUCOSE 116* 139*  BUN 12 17   CREATININE 0.61 0.59  CALCIUM 8.1* 8.3*  MG 1.9 1.8    Liver Function Tests: No results for input(s): AST, ALT, ALKPHOS, BILITOT, PROT, ALBUMIN in the last 72 hours.  No results for input(s): LIPASE, AMYLASE in the last 72 hours. CBC: No results for input(s): WBC, NEUTROABS, HGB, HCT, MCV, PLT in the last 72 hours.  Cardiac Enzymes: No results for input(s): CKTOTAL, CKMB, CKMBINDEX, TROPONINI in the last 72 hours. BNP: Invalid input(s): POCBNP D-Dimer: No results for input(s): DDIMER in the last 72 hours.  Hemoglobin A1C: No results for input(s): HGBA1C in the last 72 hours. Fasting Lipid Panel: No results for input(s): CHOL, HDL, LDLCALC, TRIG, CHOLHDL, LDLDIRECT in the last 72 hours. Thyroid Function Tests: No results for input(s): TSH, T4TOTAL, T3FREE, THYROIDAB in the last 72 hours.  Invalid input(s): FREET3 Anemia Panel: No results for input(s): VITAMINB12, FOLATE, FERRITIN, TIBC, IRON, RETICCTPCT in the last 72 hours.  DG Wrist Complete Right  Result Date: 07/23/2021 CLINICAL DATA:  Wrist pain following recent transportation move, initial encounter. EXAM: RIGHT WRIST - COMPLETE 3+ VIEW COMPARISON:  None. FINDINGS: Degenerative changes of the first [facility] joint and intercarpal joint are noted. A cystic lesion is noted in the distal ulna which appears benign in etiology. No acute fracture or dislocation is noted. Soft tissue swelling in the dorsum  of the hand is seen. IMPRESSION: Degenerative change without acute abnormality. Mild soft tissue swelling is seen. Electronically Signed   By: Inez Catalina M.D.   On: 07/23/2021 16:24   DG Abd 1 View  Result Date: 07/24/2021 CLINICAL DATA:  Abdominal pain and distension. EXAM: ABDOMEN - 1 VIEW COMPARISON:  None. FINDINGS: The bowel gas pattern is normal. Radiopaque contrast is noted throughout the ascending colon. There is subsequently limited evaluation for radiopaque renal calculi. IMPRESSION: 1. Normal bowel gas pattern. 2.  Radiopaque contrast throughout the ascending and transverse colon. Electronically Signed   By: Virgina Norfolk M.D.   On: 07/24/2021 15:39   CT SOFT TISSUE NECK W CONTRAST  Result Date: 07/24/2021 CLINICAL DATA:  Esophageal mass EXAM: CT NECK WITH CONTRAST TECHNIQUE: Multidetector CT imaging of the neck was performed using the standard protocol following the bolus administration of intravenous contrast. CONTRAST:  64mL OMNIPAQUE IOHEXOL 350 MG/ML SOLN COMPARISON:  CTA chest 07/22/2021.  CT neck December 31, 2015. FINDINGS: Pharynx and larynx: Normal. No mass or swelling. Salivary glands: No inflammation, mass, or stone. Thyroid: Normal. Lymph nodes: None enlarged or abnormal density. Vascular: Negative. Limited intracranial: Negative. Visualized orbits: Not imaged. Mastoids and visualized paranasal sinuses: Moderate mucosal thickening of bilateral maxillary sinuses, partially imaged. Skeleton: Severe multilevel degenerative disease with disc height loss, endplate sclerosis and posterior endplate spurring. Upper chest: Somewhat patulous esophagus in the chest superiorly with redemonstrated intraluminal soft tissue content and air inferiorly, partially imaged on this study. Scattered mosaic attenuation within the imaged lung apices, likely related to expiratory imaging. Motion limited evaluation lungs. IMPRESSION: Somewhat patulous esophagus in the chest superiorly with redemonstrated intraluminal soft tissue content and air inferiorly, partially imaged on this study. As mentioned on recent CTA chest, this could be related to ingested/impacted food content. Underlying esophageal lesion and/or stricture is not excluded. Recommend follow-up endoscopy and/or esophagram. Electronically Signed   By: Margaretha Sheffield M.D.   On: 07/24/2021 16:21   DG Hand 2 View Right  Result Date: 07/23/2021 CLINICAL DATA:  Hand pain following recent transportation move, initial encounter EXAM: RIGHT HAND - 2 VIEW COMPARISON:  None.  FINDINGS: Degenerative changes of the first [facility] joint are noted. Mild carpal row degenerative changes are noted as well. No acute fracture or dislocation is seen. Mild soft tissue swelling is noted particularly over the dorsum of the hand. No other focal abnormality is noted. IMPRESSION: Soft tissue swelling without acute bony abnormality. Electronically Signed   By: Inez Catalina M.D.   On: 07/23/2021 16:23   DG Chest Port 1 View  Result Date: 07/24/2021 CLINICAL DATA:  History of heart failure, progressively worsening dyspnea on exertion EXAM: PORTABLE CHEST 1 VIEW COMPARISON:  Chest radiograph 07/20/2021 FINDINGS: The heart is enlarged, unchanged. The mediastinal contours are stable. There are increased interstitial markings bilaterally suspected to reflect mild pulmonary interstitial edema, worsened since the prior study. There is unchanged asymmetric elevation of the right hemidiaphragm. Linear opacities in the lingula are unchanged and may reflect scar. There is no other focal consolidation. There is no pleural effusion or pneumothorax. There is no acute osseous abnormality. IMPRESSION: Cardiomegaly with suspected mild pulmonary interstitial edema, increased since 07/20/2021. Electronically Signed   By: Valetta Mole M.D.   On: 07/24/2021 12:01     Echo: results pending   TELEMETRY: LBBB  ASSESSMENT AND PLAN:  Principal Problem:   Acute on chronic systolic (congestive) heart failure (HCC) Active Problems:   SOB (shortness of breath)  Acute respiratory failure with hypoxia (HCC)   Diabetes mellitus without complication (HCC)   Hypothyroid   Parkinson's disease ([place])   Hyperlipidemia    Acute on chronic systolic CHF with reduced EF,  fairly stable at this time, patient appears hypervolemic at this time with dyspnea and BLE edema, Echocardiogram is remarkable for severe LV dysfunction with estimated EF of 25-30%  Continuous cardiac telemetry monitoring until discharge. Continue diuresis  with IV Lasix.  Please place patient on strict I/O so we can assess how well she is responding.  We will continue Losartan 50mg  once daily. Recommend transitioning to entresto as outpatient.  Monitor strict I's and O's. Perform daily weights. Low-sodium, heart healthy diet. Recommend HF clinic referral.    2. Dyspnea, chronic, likely due to underlying CHF with exacerbation at this time Continue supplemental oxygen therapy. Consider RT evaluation.   3. Elevated D-dimer  CT negative for Pulmonary embolus.    4. HTN, reasonably controlled, patient is normotensive at this time Continue management with ARB inhibitor and beta-blocker therapy. Monitor blood pressure per protocol.   5. HLD, reasonably stable Continue statin therapy.    6. DM2, fairly controlled Continue CBG monitoring and sliding scale insulin per protocol.      [place], [id number]-AG  07/25/2021 8:28 AM

## 2021-07-25 NOTE — Progress Notes (Signed)
PROGRESS NOTE    Stephanie [name]  ZOX:[id number] DOB: June 22, 1946 DOA: 07/20/2021 PCP: [name], MD    Brief Narrative:   Hx hfpef, last hospitalized for such at [facility] 11/2020, here with at least several weeks progressively worsening dyspnea on exertion, found to have decompensated heart failure.  CT angiography of the thorax ruled out PE however was mentioned density in esophagus possible impacted food bolus.  On interview patient states that she has had years of esophageal dysmotility.  Review of medical records significant for EGD and colonoscopy in 2020.  Patchy moderate inflammation characterized by erosions and erythema in gastric body and antrum.  Cardiology following.  Patient on diuresis.  Continues to be short of breath.  Has been diuresing.  Modified barium swallow demonstrates high risk for aspiration.  Per SLP do not recommend barium esophagram at this time.  CT soft tissue neck redemonstrates esophageal lesion of unclear etiology.  Assessment & Plan:   Principal Problem:   Acute on chronic systolic (congestive) heart failure (HCC) Active Problems:   SOB (shortness of breath)   Acute respiratory failure with hypoxia (HCC)   Diabetes mellitus without complication (HCC)   Hypothyroid   Parkinson's disease (HCC)   Hyperlipidemia  Acute on chronic systolic congestive heart failure Acute hypoxic respiratory failure pulmonary hypertension EF 1/22 at [facility] 55%, moderate pulmonary hypertension.  Repeat echocardiogram on 9/20 EF 25 to 30% Here bnp elevated to 900s, lower extremity edema, o2 in mid 80s improved to normal on 2 L.  Has intermittent need for o2 at home, usually at night.  Has received lasix here w/ symptomatic improvement.  Initial troponin neg, no chest pain.  Likely underlying osa.  Dry weight per daughter 38; 195 here. Dimer is elevated CT angio ruled out PE -UOP not accurately recorded Plan: Continue Lasix 60 mg IV twice daily Strict ins and  outs Continue ACE inhibitor Daily weights Cardiology follow-up  Possible esophageal foreign body Unclear etiology, noted on CT angio SLP evaluation complete MBSS complete Patient high risk for aspiration, do not recommend barium swallow at this time Soft tissue neck redemonstrates lesion of unclear etiology Plan: Per SLP patient is not a good candidate for barium esophagram as she represents a high aspiration risk.  Currently patient is stable and tolerating p.o. intake without issue.  May be able to defer endoscopic evaluation outpatient.   HTN Here bp wnl - diuresis as above - home metop, enalapril, statin   Parkinsons - home sinimet, amantadine   Gout - home allopurinol   T2DM Here glucose wnl - hold home pio and met - SSI   Hypothyroid - home synthroid   DVT prophylaxis: SQ Lovenox Code Status: Full Family Communication: None today Disposition Plan: Status is: Inpatient  Remains inpatient appropriate because:Inpatient level of care appropriate due to severity of illness  Dispo: The patient is from: Home              Anticipated d/c is to: Home              Patient currently is not medically stable to d/c.   Difficult to place patient No       Level of care: Med-Surg  Consultants:  Cardiology  Procedures:  None  Antimicrobials:  None   Subjective: Patient seen and examined.  Complaints mainly centered around her positioning in the bed.  Continues to endorse shortness of breath  Objective: Vitals:   07/25/21 0230 07/25/21 0500 07/25/21 0738 07/25/21 0744  BP: Marland Kitchen)  165/103   (!) 142/80  Pulse: 78   77  Resp: 16   18  Temp: (!) 97.5 F (36.4 C)   97.7 F (36.5 C)  TempSrc: Oral     SpO2: 94%  94% 100%  Weight:  86.7 kg    Height:        Intake/Output Summary (Last 24 hours) at 07/25/2021 1133 Last data filed at 07/25/2021 0950 Gross per 24 hour  Intake 480 ml  Output 1125 ml  Net -645 ml   Filed Weights   07/23/21 0500 07/24/21  0449 07/25/21 0500  Weight: 87.9 kg 87.7 kg 86.7 kg    Examination:  General exam: No acute distress.  Fatigued Respiratory system: Bibasilar crackles.  Normal work of breathing.  2 L Cardiovascular system: S1-S2, RRR, no murmurs, no pedal edema Gastrointestinal system: Soft, mild distention, positive bowel sounds Central nervous system: Alert and oriented. No focal neurological deficits. Extremities: Symmetric 5 x 5 power. Skin: No rashes, lesions or ulcers Psychiatry: Judgement and insight appear normal. Mood & affect appropriate.     Data Reviewed: I have personally reviewed following labs and imaging studies  CBC: Recent Labs  Lab 07/20/21 1824 07/21/21 0737  WBC 6.6 6.5  NEUTROABS 4.6  --   HGB 12.0 11.3*  HCT 38.9 38.0  MCV 83.3 83.7  PLT 270 263   Basic Metabolic Panel: Recent Labs  Lab 07/21/21 0737 07/22/21 0610 07/23/21 0443 07/24/21 0445 07/25/21 0511  NA 142 136 133* 133* 135  K 3.6 3.8 4.0 3.8 4.1  CL 97* 94* 92* 92* 92*  CO2 36* 33* 31 33* 35*  GLUCOSE 134* 125* 117* 116* 139*  BUN '18 16 14 12 17  ' CREATININE 0.71 0.59 0.62 0.61 0.59  CALCIUM 8.3* 8.2* 7.9* 8.1* 8.3*  MG 1.5* 2.1 1.6* 1.9 1.8  PHOS 3.2  --   --   --   --    GFR: Estimated Creatinine Clearance: 61.7 mL/min (by C-G formula based on SCr of 0.59 mg/dL). Liver Function Tests: Recent Labs  Lab 07/20/21 1824 07/21/21 0737  AST 22 21  ALT 7 5  ALKPHOS 103 97  BILITOT 1.3* 1.3*  PROT 7.1 7.0  ALBUMIN 3.2* 2.7*   No results for input(s): LIPASE, AMYLASE in the last 168 hours. No results for input(s): AMMONIA in the last 168 hours. Coagulation Profile: Recent Labs  Lab 07/21/21 0737  INR 1.2   Cardiac Enzymes: No results for input(s): CKTOTAL, CKMB, CKMBINDEX, TROPONINI in the last 168 hours. BNP (last 3 results) No results for input(s): PROBNP in the last 8760 hours. HbA1C: No results for input(s): HGBA1C in the last 72 hours. CBG: Recent Labs  Lab 07/24/21 0908  07/24/21 1232 07/24/21 1606 07/24/21 2044 07/25/21 0916  GLUCAP 164* 183* 208* 207* 146*   Lipid Profile: No results for input(s): CHOL, HDL, LDLCALC, TRIG, CHOLHDL, LDLDIRECT in the last 72 hours. Thyroid Function Tests: No results for input(s): TSH, T4TOTAL, FREET4, T3FREE, THYROIDAB in the last 72 hours. Anemia Panel: No results for input(s): VITAMINB12, FOLATE, FERRITIN, TIBC, IRON, RETICCTPCT in the last 72 hours. Sepsis Labs: Recent Labs  Lab 07/20/21 1824  PROCALCITON <0.10    Recent Results (from the past 240 hour(s))  Resp Panel by RT-PCR (Flu A&B, Covid) Nasopharyngeal Swab     Status: None   Collection Time: 07/20/21  9:35 PM   Specimen: Nasopharyngeal Swab; Nasopharyngeal(NP) swabs in vial transport medium  Result Value Ref Range Status   SARS Coronavirus  2 by RT PCR NEGATIVE NEGATIVE Final    Comment: (NOTE) SARS-CoV-2 target nucleic acids are NOT DETECTED.  The SARS-CoV-2 RNA is generally detectable in upper respiratory specimens during the acute phase of infection. The lowest concentration of SARS-CoV-2 viral copies this assay can detect is 138 copies/mL. A negative result does not preclude SARS-Cov-2 infection and should not be used as the sole basis for treatment or other patient management decisions. A negative result may occur with  improper specimen collection/handling, submission of specimen other than nasopharyngeal swab, presence of viral mutation(s) within the areas targeted by this assay, and inadequate number of viral copies(<138 copies/mL). A negative result must be combined with clinical observations, patient history, and epidemiological information. The expected result is Negative.  Fact Sheet for Patients:  EntrepreneurPulse.com.au  Fact Sheet for Healthcare Providers:  IncredibleEmployment.be  This test is no t yet approved or cleared by the [place] FDA and  has been authorized for detection  and/or diagnosis of SARS-CoV-2 by FDA under an Emergency Use Authorization (EUA). This EUA will remain  in effect (meaning this test can be used) for the duration of the COVID-19 declaration under Section 564(b)(1) of the Act, 21 U.S.C.section 360bbb-3(b)(1), unless the authorization is terminated  or revoked sooner.       Influenza A by PCR NEGATIVE NEGATIVE Final   Influenza B by PCR NEGATIVE NEGATIVE Final    Comment: (NOTE) The Xpert Xpress SARS-CoV-2/FLU/RSV plus assay is intended as an aid in the diagnosis of influenza from Nasopharyngeal swab specimens and should not be used as a sole basis for treatment. Nasal washings and aspirates are unacceptable for Xpert Xpress SARS-CoV-2/FLU/RSV testing.  Fact Sheet for Patients: EntrepreneurPulse.com.au  Fact Sheet for Healthcare Providers: IncredibleEmployment.be  This test is not yet approved or cleared by the [place] FDA and has been authorized for detection and/or diagnosis of SARS-CoV-2 by FDA under an Emergency Use Authorization (EUA). This EUA will remain in effect (meaning this test can be used) for the duration of the COVID-19 declaration under Section 564(b)(1) of the Act, 21 U.S.C. section 360bbb-3(b)(1), unless the authorization is terminated or revoked.  Performed at [facility], [address], [place], [place] [address]          Radiology Studies: DG Wrist Complete Right  Result Date: 07/23/2021 CLINICAL DATA:  Wrist pain following recent transportation move, initial encounter. EXAM: RIGHT WRIST - COMPLETE 3+ VIEW COMPARISON:  None. FINDINGS: Degenerative changes of the first [facility] joint and intercarpal joint are noted. A cystic lesion is noted in the distal ulna which appears benign in etiology. No acute fracture or dislocation is noted. Soft tissue swelling in the dorsum of the hand is seen. IMPRESSION: Degenerative change without acute abnormality. Mild soft  tissue swelling is seen. Electronically Signed   By: Inez Catalina M.D.   On: 07/23/2021 16:24   DG Abd 1 View  Result Date: 07/24/2021 CLINICAL DATA:  Abdominal pain and distension. EXAM: ABDOMEN - 1 VIEW COMPARISON:  None. FINDINGS: The bowel gas pattern is normal. Radiopaque contrast is noted throughout the ascending colon. There is subsequently limited evaluation for radiopaque renal calculi. IMPRESSION: 1. Normal bowel gas pattern. 2. Radiopaque contrast throughout the ascending and transverse colon. Electronically Signed   By: Virgina Norfolk M.D.   On: 07/24/2021 15:39   CT SOFT TISSUE NECK W CONTRAST  Result Date: 07/24/2021 CLINICAL DATA:  Esophageal mass EXAM: CT NECK WITH CONTRAST TECHNIQUE: Multidetector CT imaging of the neck was performed  using the standard protocol following the bolus administration of intravenous contrast. CONTRAST:  13m OMNIPAQUE IOHEXOL 350 MG/ML SOLN COMPARISON:  CTA chest 07/22/2021.  CT neck December 31, 2015. FINDINGS: Pharynx and larynx: Normal. No mass or swelling. Salivary glands: No inflammation, mass, or stone. Thyroid: Normal. Lymph nodes: None enlarged or abnormal density. Vascular: Negative. Limited intracranial: Negative. Visualized orbits: Not imaged. Mastoids and visualized paranasal sinuses: Moderate mucosal thickening of bilateral maxillary sinuses, partially imaged. Skeleton: Severe multilevel degenerative disease with disc height loss, endplate sclerosis and posterior endplate spurring. Upper chest: Somewhat patulous esophagus in the chest superiorly with redemonstrated intraluminal soft tissue content and air inferiorly, partially imaged on this study. Scattered mosaic attenuation within the imaged lung apices, likely related to expiratory imaging. Motion limited evaluation lungs. IMPRESSION: Somewhat patulous esophagus in the chest superiorly with redemonstrated intraluminal soft tissue content and air inferiorly, partially imaged on this study. As  mentioned on recent CTA chest, this could be related to ingested/impacted food content. Underlying esophageal lesion and/or stricture is not excluded. Recommend follow-up endoscopy and/or esophagram. Electronically Signed   By: FMargaretha SheffieldM.D.   On: 07/24/2021 16:21   DG Hand 2 View Right  Result Date: 07/23/2021 CLINICAL DATA:  Hand pain following recent transportation move, initial encounter EXAM: RIGHT HAND - 2 VIEW COMPARISON:  None. FINDINGS: Degenerative changes of the first [facility] are noted. Mild carpal row degenerative changes are noted as well. No acute fracture or dislocation is seen. Mild soft tissue swelling is noted particularly over the dorsum of the hand. No other focal abnormality is noted. IMPRESSION: Soft tissue swelling without acute bony abnormality. Electronically Signed   By: MInez CatalinaM.D.   On: 07/23/2021 16:23   DG Chest Port 1 View  Result Date: 07/24/2021 CLINICAL DATA:  History of heart failure, progressively worsening dyspnea on exertion EXAM: PORTABLE CHEST 1 VIEW COMPARISON:  Chest radiograph 07/20/2021 FINDINGS: The heart is enlarged, unchanged. The mediastinal contours are stable. There are increased interstitial markings bilaterally suspected to reflect mild pulmonary interstitial edema, worsened since the prior study. There is unchanged asymmetric elevation of the right hemidiaphragm. Linear opacities in the lingula are unchanged and may reflect scar. There is no other focal consolidation. There is no pleural effusion or pneumothorax. There is no acute osseous abnormality. IMPRESSION: Cardiomegaly with suspected mild pulmonary interstitial edema, increased since 07/20/2021. Electronically Signed   By: PValetta MoleM.D.   On: 07/24/2021 12:01        Scheduled Meds:  allopurinol  100 mg Oral Daily   amantadine  100 mg Oral BID   carbidopa-levodopa  1 tablet Oral QHS   carbidopa-levodopa  2.5 tablet Oral QID   docusate sodium  100 mg Oral BID    enoxaparin (LOVENOX) injection  40 mg Subcutaneous QHS   ferrous sulfate  325 mg Oral Q breakfast   fluticasone  1 spray Each Nare Daily   furosemide  60 mg Intravenous Q12H   gabapentin  300 mg Oral QHS   insulin aspart  0-9 Units Subcutaneous TID WC   ipratropium-albuterol  3 mL Nebulization Q6H   levothyroxine  75 mcg Oral Q0600   losartan  50 mg Oral Daily   methocarbamol  500 mg Oral TID   metoprolol succinate  50 mg Oral Q breakfast   pravastatin  40 mg Oral q1800   Continuous Infusions:  magnesium sulfate bolus IVPB 2 g (07/25/21 1053)     LOS: 5 days    Time  spent: 25 minutes    Sidney Ace, MD [facility] Pager [phone]  If 7PM-7AM, please contact night-coverage 07/25/2021, 11:33 AM

## 2021-07-26 DIAGNOSIS — I5023 Acute on chronic systolic (congestive) heart failure: Secondary | ICD-10-CM | POA: Diagnosis not present

## 2021-07-26 LAB — GLUCOSE, CAPILLARY
Glucose-Capillary: 134 mg/dL — ABNORMAL HIGH (ref 70–99)
Glucose-Capillary: 189 mg/dL — ABNORMAL HIGH (ref 70–99)
Glucose-Capillary: 216 mg/dL — ABNORMAL HIGH (ref 70–99)
Glucose-Capillary: 238 mg/dL — ABNORMAL HIGH (ref 70–99)

## 2021-07-26 LAB — BASIC METABOLIC PANEL
Anion gap: 9 (ref 5–15)
BUN: 17 mg/dL (ref 8–23)
CO2: 32 mmol/L (ref 22–32)
Calcium: 8.3 mg/dL — ABNORMAL LOW (ref 8.9–10.3)
Chloride: 91 mmol/L — ABNORMAL LOW (ref 98–111)
Creatinine, Ser: 0.59 mg/dL (ref 0.44–1.00)
GFR, Estimated: >60 mL/min (ref >60)
Glucose, Bld: 137 mg/dL — ABNORMAL HIGH (ref 70–99)
Potassium: 4.1 mmol/L (ref 3.5–5.1)
Sodium: 132 mmol/L — ABNORMAL LOW (ref 135–145)

## 2021-07-26 LAB — MAGNESIUM: Magnesium: 1.7 mg/dL (ref 1.7–2.4)

## 2021-07-26 MED ORDER — MAGNESIUM SULFATE 2 GM/50ML IV SOLN
2.0000 g | Freq: Once | INTRAVENOUS | Status: AC
  Administered 2021-07-26: 2 g via INTRAVENOUS
  Filled 2021-07-26: qty 50

## 2021-07-26 MED ORDER — METOLAZONE 2.5 MG PO TABS
2.5000 mg | ORAL_TABLET | Freq: Every day | ORAL | Status: DC
  Administered 2021-07-26 – 2021-07-28 (×3): 2.5 mg via ORAL
  Filled 2021-07-26 (×3): qty 1

## 2021-07-26 NOTE — Progress Notes (Signed)
PROGRESS NOTE    Stephanie [name]  IDH:[id number] DOB: May 03, 1946 DOA: 07/20/2021 PCP: [name], MD    Brief Narrative:   Hx hfpef, last hospitalized for such at [facility] 11/2020, here with at least several weeks progressively worsening dyspnea on exertion, found to have decompensated heart failure.  CT angiography of the thorax ruled out PE however was mentioned density in esophagus possible impacted food bolus.  On interview patient states that she has had years of esophageal dysmotility.  Review of medical records significant for EGD and colonoscopy in 2020.  Patchy moderate inflammation characterized by erosions and erythema in gastric body and antrum.  Cardiology following.  Patient on diuresis.  Continues to be short of breath.  Has been diuresing.  Modified barium swallow demonstrates high risk for aspiration.  Per SLP do not recommend barium esophagram at this time.  CT soft tissue neck redemonstrates esophageal lesion of unclear etiology.  Assessment & Plan:   Principal Problem:   Acute on chronic systolic (congestive) heart failure (HCC) Active Problems:   SOB (shortness of breath)   Acute respiratory failure with hypoxia (HCC)   Diabetes mellitus without complication (HCC)   Hypothyroid   Parkinson's disease (HCC)   Hyperlipidemia  Acute on chronic systolic congestive heart failure Acute hypoxic respiratory failure pulmonary hypertension EF 1/22 at [facility] 55%, moderate pulmonary hypertension.  Repeat echocardiogram on 9/20 EF 25 to 30% Here bnp elevated to 900s, lower extremity edema, o2 in mid 80s improved to normal on 2 L.  Has intermittent need for o2 at home, usually at night.  Has received lasix here w/ symptomatic improvement.  Initial troponin neg, no chest pain.  Likely underlying osa.  Dry weight per daughter 87; 195 here. Dimer is elevated CT angio ruled out PE -UOP not accurately recorded Plan: Continue Lasix 60 mg IV twice daily Add metolazone 2.5  mg p.o. daily Strict ins and outs.  Discussed with nursing staff Continue ACE inhibitor Daily weights Cardiology follow-up  Possible esophageal foreign body Unclear etiology, noted on CT angio SLP evaluation complete MBSS complete Patient high risk for aspiration, do not recommend barium swallow at this time Soft tissue neck redemonstrates lesion of unclear etiology Plan: Per SLP patient is not a good candidate for barium esophagram as she represents a high aspiration risk.  Currently patient is stable and tolerating p.o. intake without issue.  May be able to defer endoscopic evaluation outpatient.  If patient has any signs or symptoms of aspiration, dysphagia, odynophagia will discuss case with GI to consider possible endoscopy.   HTN Here bp wnl - diuresis as above - home metop, enalapril, statin   Parkinsons - home sinimet, amantadine   Gout - home allopurinol   T2DM Here glucose wnl - hold home pio and met - SSI   Hypothyroid - home synthroid   DVT prophylaxis: SQ Lovenox Code Status: Full Family Communication: None today Disposition Plan: Status is: Inpatient  Remains inpatient appropriate because:Inpatient level of care appropriate due to severity of illness  Dispo: The patient is from: Home              Anticipated d/c is to: Home              Patient currently is not medically stable to d/c.   Difficult to place patient No       Level of care: Med-Surg  Consultants:  Cardiology  Procedures:  None  Antimicrobials:  None   Subjective: Patient seen and examined.  Appears a little bit more comfortable this morning.   600 cc urine output yesterday  Objective: Vitals:   07/26/21 0229 07/26/21 0430 07/26/21 0547 07/26/21 0751  BP:  (!) 158/92  (!) 148/84  Pulse:  96  84  Resp:  16  16  Temp:  98.6 F (37 C)  (!) 97.1 F (36.2 C)  TempSrc:    Axillary  SpO2: 96% 99%  96%  Weight:   86.8 kg   Height:        Intake/Output Summary (Last  24 hours) at 07/26/2021 1055 Last data filed at 07/26/2021 0950 Gross per 24 hour  Intake 1201.99 ml  Output 1550 ml  Net -348.01 ml   Filed Weights   07/24/21 0449 07/25/21 0500 07/26/21 0547  Weight: 87.7 kg 86.7 kg 86.8 kg    Examination:  General exam: No acute distress. Respiratory system: Bibasilar crackles.  Normal work of breathing.  2 L Cardiovascular system: S1-S2, RRR, no murmurs, 1+ pitting edema Gastrointestinal system: Soft, mild distention, positive bowel sounds Central nervous system: Alert and oriented. No focal neurological deficits. Extremities: Symmetric 5 x 5 power. Skin: Scattered ecchymoses on face secondary to fall Psychiatry: Judgement and insight appear normal. Mood & affect appropriate.     Data Reviewed: I have personally reviewed following labs and imaging studies  CBC: Recent Labs  Lab 07/20/21 1824 07/21/21 0737  WBC 6.6 6.5  NEUTROABS 4.6  --   HGB 12.0 11.3*  HCT 38.9 38.0  MCV 83.3 83.7  PLT 270 119   Basic Metabolic Panel: Recent Labs  Lab 07/21/21 0737 07/22/21 0610 07/23/21 0443 07/24/21 0445 07/25/21 0511 07/26/21 0541  NA 142 136 133* 133* 135 132*  K 3.6 3.8 4.0 3.8 4.1 4.1  CL 97* 94* 92* 92* 92* 91*  CO2 36* 33* 31 33* 35* 32  GLUCOSE 134* 125* 117* 116* 139* 137*  BUN '18 16 14 12 17 17  ' CREATININE 0.71 0.59 0.62 0.61 0.59 0.59  CALCIUM 8.3* 8.2* 7.9* 8.1* 8.3* 8.3*  MG 1.5* 2.1 1.6* 1.9 1.8 1.7  PHOS 3.2  --   --   --   --   --    GFR: Estimated Creatinine Clearance: 61.7 mL/min (by C-G formula based on SCr of 0.59 mg/dL). Liver Function Tests: Recent Labs  Lab 07/20/21 1824 07/21/21 0737  AST 22 21  ALT 7 5  ALKPHOS 103 97  BILITOT 1.3* 1.3*  PROT 7.1 7.0  ALBUMIN 3.2* 2.7*   No results for input(s): LIPASE, AMYLASE in the last 168 hours. No results for input(s): AMMONIA in the last 168 hours. Coagulation Profile: Recent Labs  Lab 07/21/21 0737  INR 1.2   Cardiac Enzymes: No results for  input(s): CKTOTAL, CKMB, CKMBINDEX, TROPONINI in the last 168 hours. BNP (last 3 results) No results for input(s): PROBNP in the last 8760 hours. HbA1C: No results for input(s): HGBA1C in the last 72 hours. CBG: Recent Labs  Lab 07/25/21 0916 07/25/21 1349 07/25/21 1605 07/25/21 2016 07/26/21 0758  GLUCAP 146* 272* 269* 220* 134*   Lipid Profile: No results for input(s): CHOL, HDL, LDLCALC, TRIG, CHOLHDL, LDLDIRECT in the last 72 hours. Thyroid Function Tests: No results for input(s): TSH, T4TOTAL, FREET4, T3FREE, THYROIDAB in the last 72 hours. Anemia Panel: No results for input(s): VITAMINB12, FOLATE, FERRITIN, TIBC, IRON, RETICCTPCT in the last 72 hours. Sepsis Labs: Recent Labs  Lab 07/20/21 1824  PROCALCITON <0.10    Recent Results (from the past 240 hour(s))  Resp Panel by RT-PCR (Flu A&B, Covid) Nasopharyngeal Swab     Status: None   Collection Time: 07/20/21  9:35 PM   Specimen: Nasopharyngeal Swab; Nasopharyngeal(NP) swabs in vial transport medium  Result Value Ref Range Status   SARS Coronavirus 2 by RT PCR NEGATIVE NEGATIVE Final    Comment: (NOTE) SARS-CoV-2 target nucleic acids are NOT DETECTED.  The SARS-CoV-2 RNA is generally detectable in upper respiratory specimens during the acute phase of infection. The lowest concentration of SARS-CoV-2 viral copies this assay can detect is 138 copies/mL. A negative result does not preclude SARS-Cov-2 infection and should not be used as the sole basis for treatment or other patient management decisions. A negative result may occur with  improper specimen collection/handling, submission of specimen other than nasopharyngeal swab, presence of viral mutation(s) within the areas targeted by this assay, and inadequate number of viral copies(<138 copies/mL). A negative result must be combined with clinical observations, patient history, and epidemiological information. The expected result is Negative.  Fact Sheet for  Patients:  EntrepreneurPulse.com.au  Fact Sheet for Healthcare Providers:  IncredibleEmployment.be  This test is no t yet approved or cleared by the [place] FDA and  has been authorized for detection and/or diagnosis of SARS-CoV-2 by FDA under an Emergency Use Authorization (EUA). This EUA will remain  in effect (meaning this test can be used) for the duration of the COVID-19 declaration under Section 564(b)(1) of the Act, 21 U.S.C.section 360bbb-3(b)(1), unless the authorization is terminated  or revoked sooner.       Influenza A by PCR NEGATIVE NEGATIVE Final   Influenza B by PCR NEGATIVE NEGATIVE Final    Comment: (NOTE) The Xpert Xpress SARS-CoV-2/FLU/RSV plus assay is intended as an aid in the diagnosis of influenza from Nasopharyngeal swab specimens and should not be used as a sole basis for treatment. Nasal washings and aspirates are unacceptable for Xpert Xpress SARS-CoV-2/FLU/RSV testing.  Fact Sheet for Patients: EntrepreneurPulse.com.au  Fact Sheet for Healthcare Providers: IncredibleEmployment.be  This test is not yet approved or cleared by the [place] FDA and has been authorized for detection and/or diagnosis of SARS-CoV-2 by FDA under an Emergency Use Authorization (EUA). This EUA will remain in effect (meaning this test can be used) for the duration of the COVID-19 declaration under Section 564(b)(1) of the Act, 21 U.S.C. section 360bbb-3(b)(1), unless the authorization is terminated or revoked.  Performed at [facility], [address] [place], [place] [address]          Radiology Studies: DG Abd 1 View  Result Date: 07/24/2021 CLINICAL DATA:  Abdominal pain and distension. EXAM: ABDOMEN - 1 VIEW COMPARISON:  None. FINDINGS: The bowel gas pattern is normal. Radiopaque contrast is noted throughout the ascending colon. There is subsequently limited evaluation  for radiopaque renal calculi. IMPRESSION: 1. Normal bowel gas pattern. 2. Radiopaque contrast throughout the ascending and transverse colon. Electronically Signed   By: Virgina Norfolk M.D.   On: 07/24/2021 15:39   CT SOFT TISSUE NECK W CONTRAST  Result Date: 07/24/2021 CLINICAL DATA:  Esophageal mass EXAM: CT NECK WITH CONTRAST TECHNIQUE: Multidetector CT imaging of the neck was performed using the standard protocol following the bolus administration of intravenous contrast. CONTRAST:  57m OMNIPAQUE IOHEXOL 350 MG/ML SOLN COMPARISON:  CTA chest 07/22/2021.  CT neck December 31, 2015. FINDINGS: Pharynx and larynx: Normal. No mass or swelling. Salivary glands: No inflammation, mass, or stone. Thyroid: Normal. Lymph nodes: None enlarged or abnormal density. Vascular: Negative. Limited  intracranial: Negative. Visualized orbits: Not imaged. Mastoids and visualized paranasal sinuses: Moderate mucosal thickening of bilateral maxillary sinuses, partially imaged. Skeleton: Severe multilevel degenerative disease with disc height loss, endplate sclerosis and posterior endplate spurring. Upper chest: Somewhat patulous esophagus in the chest superiorly with redemonstrated intraluminal soft tissue content and air inferiorly, partially imaged on this study. Scattered mosaic attenuation within the imaged lung apices, likely related to expiratory imaging. Motion limited evaluation lungs. IMPRESSION: Somewhat patulous esophagus in the chest superiorly with redemonstrated intraluminal soft tissue content and air inferiorly, partially imaged on this study. As mentioned on recent CTA chest, this could be related to ingested/impacted food content. Underlying esophageal lesion and/or stricture is not excluded. Recommend follow-up endoscopy and/or esophagram. Electronically Signed   By: Margaretha Sheffield M.D.   On: 07/24/2021 16:21        Scheduled Meds:  allopurinol  100 mg Oral Daily   amantadine  100 mg Oral BID    carbidopa-levodopa  1 tablet Oral QHS   carbidopa-levodopa  2.5 tablet Oral QID   docusate sodium  100 mg Oral BID   enoxaparin (LOVENOX) injection  40 mg Subcutaneous QHS   ferrous sulfate  325 mg Oral Q breakfast   fluticasone  1 spray Each Nare Daily   furosemide  60 mg Intravenous Q12H   gabapentin  300 mg Oral QHS   insulin aspart  0-9 Units Subcutaneous TID WC   ipratropium-albuterol  3 mL Nebulization Q6H   levothyroxine  75 mcg Oral Q0600   losartan  50 mg Oral Daily   methocarbamol  500 mg Oral TID   metolazone  2.5 mg Oral Daily   metoprolol succinate  50 mg Oral Q breakfast   pravastatin  40 mg Oral q1800   Continuous Infusions:     LOS: 6 days    Time spent: 25 minutes    Sidney Ace, MD [facility] Pager [phone]  If 7PM-7AM, please contact night-coverage 07/26/2021, 10:55 AM

## 2021-07-26 NOTE — Progress Notes (Signed)
[facility] Cardiology  Patient Description: Stephanie [name] is a 75 year old female patient with PMH significant for chronic systolic CHF (most recent EF = 40-45%), LBBB, hypertension, hyperlipidemia, diabetes mellitus type 2, chronic iron deficiency anemia, Parkinson's disease and obesity who presented to the ER due to worsening shortness of breath and found to be in CHF exacerbation, thus Cardiology was consulted.   Interval history:  - -600 yesterday net.  - Says she feels better today. Difficult to assess volume status but still with some edema in BL thighs.  - No chest pain. Some chronic shortness of breath.    Vitals:   07/26/21 0229 07/26/21 0430 07/26/21 0547 07/26/21 0751  BP:  (!) 158/92  (!) 148/84  Pulse:  96  84  Resp:  16  16  Temp:  98.6 F (37 C)  (!) 97.1 F (36.2 C)  TempSrc:    Axillary  SpO2: 96% 99%  96%  Weight:   86.8 kg   Height:         Intake/Output Summary (Last 24 hours) at 07/26/2021 8295 Last data filed at 07/25/2021 2328 Gross per 24 hour  Intake 1201.99 ml  Output 1850 ml  Net -648.01 ml       PHYSICAL EXAM  General: Well developed, well nourished, in no acute distress HEENT:  Normocephalic and atramatic, facial bruising noted to the right of the face Neck:   No JVD.  Lungs: Clear bilaterally to auscultation. Chest expansion symmetrical, no wheezes, rales or rhonchi Heart: HRRR . Normal S1 and S2 without gallops or murmurs.  Abdomen: Bowel sounds are positive, abdomen edematous and slightly tender.  Msk:  Normal strength and tone for age. Extremities: moderate, 2 pitting edema noted to the BLE with erythema. No clubbing or cyanosis  Neuro: Alert and oriented X 3. Psych:  Good affect, responds appropriately   LABS: Basic Metabolic Panel: Recent Labs    07/25/21 0511 07/26/21 0541  NA 135 132*  K 4.1 4.1  CL 92* 91*  CO2 35* 32  GLUCOSE 139* 137*  BUN 17 17  CREATININE 0.59 0.59  CALCIUM 8.3* 8.3*  MG 1.8 1.7    Liver Function  Tests: No results for input(s): AST, ALT, ALKPHOS, BILITOT, PROT, ALBUMIN in the last 72 hours.  No results for input(s): LIPASE, AMYLASE in the last 72 hours. CBC: No results for input(s): WBC, NEUTROABS, HGB, HCT, MCV, PLT in the last 72 hours.  Cardiac Enzymes: No results for input(s): CKTOTAL, CKMB, CKMBINDEX, TROPONINI in the last 72 hours. BNP: Invalid input(s): POCBNP D-Dimer: No results for input(s): DDIMER in the last 72 hours.  Hemoglobin A1C: No results for input(s): HGBA1C in the last 72 hours. Fasting Lipid Panel: No results for input(s): CHOL, HDL, LDLCALC, TRIG, CHOLHDL, LDLDIRECT in the last 72 hours. Thyroid Function Tests: No results for input(s): TSH, T4TOTAL, T3FREE, THYROIDAB in the last 72 hours.  Invalid input(s): FREET3 Anemia Panel: No results for input(s): VITAMINB12, FOLATE, FERRITIN, TIBC, IRON, RETICCTPCT in the last 72 hours.  DG Abd 1 View  Result Date: 07/24/2021 CLINICAL DATA:  Abdominal pain and distension. EXAM: ABDOMEN - 1 VIEW COMPARISON:  None. FINDINGS: The bowel gas pattern is normal. Radiopaque contrast is noted throughout the ascending colon. There is subsequently limited evaluation for radiopaque renal calculi. IMPRESSION: 1. Normal bowel gas pattern. 2. Radiopaque contrast throughout the ascending and transverse colon. Electronically Signed   By: Virgina Norfolk M.D.   On: 07/24/2021 15:39   CT SOFT TISSUE NECK W  CONTRAST  Result Date: 07/24/2021 CLINICAL DATA:  Esophageal mass EXAM: CT NECK WITH CONTRAST TECHNIQUE: Multidetector CT imaging of the neck was performed using the standard protocol following the bolus administration of intravenous contrast. CONTRAST:  4mL OMNIPAQUE IOHEXOL 350 MG/ML SOLN COMPARISON:  CTA chest 07/22/2021.  CT neck December 31, 2015. FINDINGS: Pharynx and larynx: Normal. No mass or swelling. Salivary glands: No inflammation, mass, or stone. Thyroid: Normal. Lymph nodes: None enlarged or abnormal density. Vascular:  Negative. Limited intracranial: Negative. Visualized orbits: Not imaged. Mastoids and visualized paranasal sinuses: Moderate mucosal thickening of bilateral maxillary sinuses, partially imaged. Skeleton: Severe multilevel degenerative disease with disc height loss, endplate sclerosis and posterior endplate spurring. Upper chest: Somewhat patulous esophagus in the chest superiorly with redemonstrated intraluminal soft tissue content and air inferiorly, partially imaged on this study. Scattered mosaic attenuation within the imaged lung apices, likely related to expiratory imaging. Motion limited evaluation lungs. IMPRESSION: Somewhat patulous esophagus in the chest superiorly with redemonstrated intraluminal soft tissue content and air inferiorly, partially imaged on this study. As mentioned on recent CTA chest, this could be related to ingested/impacted food content. Underlying esophageal lesion and/or stricture is not excluded. Recommend follow-up endoscopy and/or esophagram. Electronically Signed   By: Margaretha Sheffield M.D.   On: 07/24/2021 16:21   DG Chest Port 1 View  Result Date: 07/24/2021 CLINICAL DATA:  History of heart failure, progressively worsening dyspnea on exertion EXAM: PORTABLE CHEST 1 VIEW COMPARISON:  Chest radiograph 07/20/2021 FINDINGS: The heart is enlarged, unchanged. The mediastinal contours are stable. There are increased interstitial markings bilaterally suspected to reflect mild pulmonary interstitial edema, worsened since the prior study. There is unchanged asymmetric elevation of the right hemidiaphragm. Linear opacities in the lingula are unchanged and may reflect scar. There is no other focal consolidation. There is no pleural effusion or pneumothorax. There is no acute osseous abnormality. IMPRESSION: Cardiomegaly with suspected mild pulmonary interstitial edema, increased since 07/20/2021. Electronically Signed   By: Valetta Mole M.D.   On: 07/24/2021 12:01     Echo: results  pending   TELEMETRY: LBBB  ASSESSMENT AND PLAN:  Principal Problem:   Acute on chronic systolic (congestive) heart failure (HCC) Active Problems:   SOB (shortness of breath)   Acute respiratory failure with hypoxia (HCC)   Diabetes mellitus without complication (HCC)   Hypothyroid   Parkinson's disease ([place])   Hyperlipidemia    Acute on chronic systolic CHF with reduced EF,  fairly stable at this time, patient appears hypervolemic at this time with dyspnea and BLE edema, Echocardiogram is remarkable for severe LV dysfunction with estimated EF of 25-30%  Continuous cardiac telemetry monitoring until discharge. Continue diuresis with IV Lasix 60 mg BID (may need to increase to 80 mg).  Please place patient on strict I/O so we can assess how well she is responding.  We will continue Losartan 50mg  once daily. Recommend transitioning to entresto as outpatient.  Monitor strict I's and O's. Perform daily weights. Low-sodium, heart healthy diet. Recommend HF clinic referral.    2. Dyspnea, chronic, likely due to underlying CHF with exacerbation at this time Continue supplemental oxygen therapy. Consider RT evaluation.   3. Elevated D-dimer  CT negative for Pulmonary embolus.    4. HTN, reasonably controlled, patient is normotensive at this time Continue management with ARB inhibitor and beta-blocker therapy. Monitor blood pressure per protocol.   5. HLD, reasonably stable Continue statin therapy.    6. DM2, fairly controlled Continue CBG monitoring and sliding  scale insulin per protocol.      Andrez Grime, MD 07/26/2021 8:38 AM

## 2021-07-27 DIAGNOSIS — I5023 Acute on chronic systolic (congestive) heart failure: Secondary | ICD-10-CM | POA: Diagnosis not present

## 2021-07-27 LAB — GLUCOSE, CAPILLARY
Glucose-Capillary: 204 mg/dL — ABNORMAL HIGH (ref 70–99)
Glucose-Capillary: 205 mg/dL — ABNORMAL HIGH (ref 70–99)
Glucose-Capillary: 318 mg/dL — ABNORMAL HIGH (ref 70–99)
Glucose-Capillary: 159 mg/dL — ABNORMAL HIGH (ref 70–99)

## 2021-07-27 LAB — BASIC METABOLIC PANEL
Anion gap: 10 (ref 5–15)
BUN: 16 mg/dL (ref 8–23)
CO2: 38 mmol/L — ABNORMAL HIGH (ref 22–32)
Calcium: 8.5 mg/dL — ABNORMAL LOW (ref 8.9–10.3)
Chloride: 84 mmol/L — ABNORMAL LOW (ref 98–111)
Creatinine, Ser: 0.47 mg/dL (ref 0.44–1.00)
GFR, Estimated: >60 mL/min (ref >60)
Glucose, Bld: 148 mg/dL — ABNORMAL HIGH (ref 70–99)
Potassium: 3.6 mmol/L (ref 3.5–5.1)
Sodium: 132 mmol/L — ABNORMAL LOW (ref 135–145)

## 2021-07-27 LAB — MAGNESIUM: Magnesium: 1.6 mg/dL — ABNORMAL LOW (ref 1.7–2.4)

## 2021-07-27 MED ORDER — MAGNESIUM SULFATE 2 GM/50ML IV SOLN
2.0000 g | Freq: Once | INTRAVENOUS | Status: AC
  Administered 2021-07-27: 2 g via INTRAVENOUS
  Filled 2021-07-27: qty 50

## 2021-07-27 MED ORDER — POTASSIUM CHLORIDE CRYS ER 20 MEQ PO TBCR
40.0000 meq | EXTENDED_RELEASE_TABLET | Freq: Once | ORAL | Status: AC
  Administered 2021-07-27: 40 meq via ORAL
  Filled 2021-07-27: qty 2

## 2021-07-27 NOTE — Progress Notes (Signed)
Speech Language Pathology Treatment: Dysphagia  Patient Details Name: Stephanie [name] MRN: [id number] DOB: 19-Dec-1945 Today's Date: 07/27/2021 Time: 1240-1310 SLP Time Calculation (min) (ACUTE ONLY): 30 min  Assessment / Plan / Recommendation Clinical Impression  Pt seen for ongoing assessment of swallowing and toleration of her dysphagia 3 diet; education re: swallowing, REFLUX precautions, diet consistency rec'd, and general aspiration precautions including recommendation for easier clearing of Pills by use of a PUREE -- for all Pill swallowing. Pt has a Baseline of Esophageal dysmotility w/ solids especially. She has a h/o Esophagitis and Dilation by GI in the past. Noted results of recent Neck CT indicating "patulous esophagus in the chest superiorly with re-demonstrated intraluminal soft tissue content and air inferiorly".    Discussed w/ pt the current diet consistency including use of Cut meats w/ moistened, cooked foods for ease of mastication and overall intake. Pt, NSG denied any overt s/s of aspiration w/ oral intake at recent meals. Talked w/ pt general aspiration precautions to include Small sips Slowly, No straws if coughing noted and to lessen air swallowed, and Sit fully Upright w/ all oral intake. Recommended reducing distractions including Talking at meals and clearing mouth fully b/t Small bites/sips. Also discussed food consistencies and options, preparation, and use of condiments to soften foods also. Encouragement given for ALL Pills to be swallowed using a Puree for safer clearing of the oropharynx. In addition, discussed the need to follow REFLUX precautions d/t Esophageal Dysmotility (regurgitation of phlegm and material per pt). Recommended f/u w/ GI.  Handouts given to pt on general aspiration precautions and Pill swallowing; diet consistency and preparation suggestions and options; REFLUX precautions.     Recommend continue a Dysphagia level 3(Chopped meats) w/ thin  liquids- Pills in a Puree for safer swallowing. Education completed w/ pt. ST services will sign off at this time w/ MD to reconsult if any new needs while admitted.  MD updated and agreed.       HPI HPI: Pt is a 75 y.o. female with a past medical history of anemia, diabetes, gastric reflux, hypertension, hyperlipidemia, CHF, Parkinson's Dis, Tremors, who presents to the emergency department from her cardiology office for fluid overload.  According to the patient and her daughter patient has a history of CHF has had worsening fluid accumulation with lower extremity edema and now weepage.  Worsening shortness of breath that has acutely worsened over the past 2 to 3 days.  Patient was seen by Dr. Nehemiah Massed of cardiology today, they had increased her Lasix on Saturday but have not noted any improvement in the patient's presented to the emergency department today.  Here the patient is satting around 88% on room air, she does wear 2 L of oxygen if needed at home which she does typically not needed but has needed recently.  Patient satting in the mid 90s on 2 L.  Denies any chest pain.  Patient does have bruising to her face which she states was from a Fall approximately 2 weeks ago, she is already been worked up for the Fall.  Pt has Significant c/o Esophageal Dysmotility; EGD in 2020 revealed: "Patchy moderate inflammation characterized by erosions and  erythema was found in the gastric body and in the gastric antrum".    CXR this admit: "Stable cardiomegaly with mild, stable bibasilar linear scarring  and/or atelectasis.".      SLP Plan  All goals met      Recommendations for follow up therapy are one component of a multi-disciplinary  discharge planning process, led by the attending physician.  Recommendations may be updated based on patient status, additional functional criteria and insurance authorization.    Recommendations  Diet recommendations: Dysphagia 3 (mechanical soft);Thin liquid Liquids  provided via: Cup Medication Administration: Whole meds with puree (for ease of swallowing) Supervision: Patient able to self feed Compensations: Minimize environmental distractions;Slow rate;Small sips/bites;Lingual sweep for clearance of pocketing;Multiple dry swallows after each bite/sip;Follow solids with liquid Postural Changes and/or Swallow Maneuvers: Out of bed for meals;Seated upright 90 degrees;Upright 30-60 min after meal (REFLUX precautions)                General recommendations:  (Dietician f/u; GI f/u for Esophageal phase dysmotility) Oral Care Recommendations: Oral care BID;Oral care before and after PO;Patient independent with oral care Follow up Recommendations: None SLP Visit Diagnosis: Dysphagia, pharyngoesophageal phase (R13.14) (missing bottom Dentition) Plan: All goals met       GO                Orinda Kenner, MS, CCC-SLP Speech Language Pathologist Rehab Services [phone] [facility] 07/27/2021, 1:55 PM

## 2021-07-27 NOTE — Consult Note (Signed)
[facility] Nurse Consult Note: Reason for Consult: Bilateral Unna's Boot changes. Due weekly on Mondays Wound type:venous insufficiency Pressure Injury POA: N/A Measurement: Wound to LLE posterior aspect measures 4cm x 5cm and partial thickness depth of 0.1cm. This is covered with a silicone foam bordered dressing prior to UB application. Anterior/pretibial areas of bilateral LEs with partial thickness plaques. Wound bed:red, moist Drainage (amount, consistency, odor) serosanguinous Periwound:intact, dry and as noted above Dressing procedure/placement/frequency: Bilateral Unna's Boots are removed by Nursing staff and legs washed. Zinc paste bandages are applied from just below toes to just below knee, topped with conform bandaging and topped with self-adherent wraps. Patient tolerated procedure well. I appreciate the assistance of the Bedside RN.  [facility] nursing team will not follow, but will remain available to this patient, the nursing and medical teams.  Please re-consult if needed. Thanks, Maudie Flakes, MSN, RN, [place], Arther Abbott  Pager# [phone]

## 2021-07-27 NOTE — Care Management Important Message (Signed)
Important Message  Patient Details  Name: Stephanie [name] MRN: [id number] Date of Birth: Mar 28, 1946   Medicare Important Message Given:  Yes     Dannette Barbara 07/27/2021, 3:38 PM

## 2021-07-27 NOTE — Progress Notes (Signed)
PROGRESS NOTE    Stephanie [name]  HER:[id number] DOB: [birth year] DOA: 07/20/2021 PCP: [name], MD    Brief Narrative:   Hx hfpef, last hospitalized for such at [facility] 11/2020, here with at least several weeks progressively worsening dyspnea on exertion, found to have decompensated heart failure.  CT angiography of the thorax ruled out PE however was mentioned density in esophagus possible impacted food bolus.  On interview patient states that she has had years of esophageal dysmotility.  Review of medical records significant for EGD and colonoscopy in 2020.  Patchy moderate inflammation characterized by erosions and erythema in gastric body and antrum.  Cardiology following.  Patient on diuresis.  Continues to be short of breath.  Has been diuresing.  Modified barium swallow demonstrates high risk for aspiration.  Per SLP do not recommend barium esophagram at this time.  CT soft tissue neck redemonstrates esophageal lesion of unclear etiology.  Assessment & Plan:   Principal Problem:   Acute on chronic systolic (congestive) heart failure (HCC) Active Problems:   SOB (shortness of breath)   Acute respiratory failure with hypoxia (HCC)   Diabetes mellitus without complication (HCC)   Hypothyroid   Parkinson's disease (HCC)   Hyperlipidemia  Acute on chronic systolic congestive heart failure Acute hypoxic respiratory failure pulmonary hypertension EF 1/22 at [facility] 55%, moderate pulmonary hypertension.  Repeat echocardiogram on 9/20 EF 25 to 30% Here bnp elevated to 900s, lower extremity edema, o2 in mid 80s improved to normal on 2 L.  Has intermittent need for o2 at home, usually at night.  Has received lasix here w/ symptomatic improvement.  Initial troponin neg, no chest pain.  Likely underlying osa.  Dry weight per daughter 36; 195 here. Dimer is elevated CT angio ruled out PE -Urine output improved Plan: Continue Lasix 60 mg IV twice daily Continue Zaroxolyn 2.5 mg  daily Strict ins and outs.  Discussed with nursing staff Target net -1-1.5 L daily Continue ACE inhibitor Daily weights Cardiology follow-up  Possible esophageal foreign body Unclear etiology, noted on CT angio SLP evaluation complete MBSS complete Patient high risk for aspiration, do not recommend barium swallow at this time Soft tissue neck redemonstrates lesion of unclear etiology Plan: Per SLP patient is not a good candidate for barium esophagram as she represents a high aspiration risk.  Currently patient is stable and tolerating p.o. intake without issue.  May be able to defer endoscopic evaluation outpatient.  If patient has any signs or symptoms of aspiration, dysphagia, odynophagia will discuss case with GI to consider possible endoscopy.  At this time patient is tolerating p.o.  SLP following for diet.  Patient does have some esophageal dysmotility issues but likely can be evaluated as an outpatient.   HTN Here bp wnl - diuresis as above - home metop, enalapril, statin   Parkinsons - home sinimet, amantadine   Gout - home allopurinol   T2DM Here glucose wnl - hold home pio and met - SSI   Hypothyroid - home synthroid   DVT prophylaxis: SQ Lovenox Code Status: Full Family Communication: None today Disposition Plan: Status is: Inpatient  Remains inpatient appropriate because:Inpatient level of care appropriate due to severity of illness  Dispo: The patient is from: Home              Anticipated d/c is to: Home              Patient currently is not medically stable to d/c.   Difficult to  place patient No       Level of care: Med-Surg  Consultants:  Cardiology  Procedures:  None  Antimicrobials:  None   Subjective: Patient seen and examined.  Looks uncomfortable this morning.  Volume status improved.  Objective: Vitals:   07/27/21 0442 07/27/21 0737 07/27/21 0924 07/27/21 1233  BP:   132/80 138/88  Pulse:   87 92  Resp:   18 17  Temp:    98.1 F (36.7 C) 98.2 F (36.8 C)  TempSrc:   Oral   SpO2:  98% 100% 98%  Weight: 85.4 kg     Height:        Intake/Output Summary (Last 24 hours) at 07/27/2021 1348 Last data filed at 07/27/2021 1234 Gross per 24 hour  Intake 1080 ml  Output 3700 ml  Net -2620 ml   Filed Weights   07/25/21 0500 07/26/21 0547 07/27/21 0442  Weight: 86.7 kg 86.8 kg 85.4 kg    Examination:  General exam: Looks uncomfortable due to back pain Respiratory system: Mild crackles bilaterally.  Normal work of breathing.  2 L Cardiovascular system: S1-S2, RRR, no murmurs, 1+ pitting edema Gastrointestinal system: Soft, mild distention, positive bowel sounds Central nervous system: Alert and oriented. No focal neurological deficits. Extremities: Symmetric 5 x 5 power. Skin: Scattered ecchymoses on face secondary to fall Psychiatry: Judgement and insight appear normal. Mood & affect appropriate.     Data Reviewed: I have personally reviewed following labs and imaging studies  CBC: Recent Labs  Lab 07/20/21 1824 07/21/21 0737  WBC 6.6 6.5  NEUTROABS 4.6  --   HGB 12.0 11.3*  HCT 38.9 38.0  MCV 83.3 83.7  PLT 270 175   Basic Metabolic Panel: Recent Labs  Lab 07/21/21 0737 07/22/21 0610 07/23/21 0443 07/24/21 0445 07/25/21 0511 07/26/21 0541 07/27/21 0558  NA 142   < > 133* 133* 135 132* 132*  K 3.6   < > 4.0 3.8 4.1 4.1 3.6  CL 97*   < > 92* 92* 92* 91* 84*  CO2 36*   < > 31 33* 35* 32 38*  GLUCOSE 134*   < > 117* 116* 139* 137* 148*  BUN 18   < > '14 12 17 17 16  ' CREATININE 0.71   < > 0.62 0.61 0.59 0.59 0.47  CALCIUM 8.3*   < > 7.9* 8.1* 8.3* 8.3* 8.5*  MG 1.5*   < > 1.6* 1.9 1.8 1.7 1.6*  PHOS 3.2  --   --   --   --   --   --    < > = values in this interval not displayed.   GFR: Estimated Creatinine Clearance: 61.2 mL/min (by C-G formula based on SCr of 0.47 mg/dL). Liver Function Tests: Recent Labs  Lab 07/20/21 1824 07/21/21 0737  AST 22 21  ALT 7 5  ALKPHOS 103 97   BILITOT 1.3* 1.3*  PROT 7.1 7.0  ALBUMIN 3.2* 2.7*   No results for input(s): LIPASE, AMYLASE in the last 168 hours. No results for input(s): AMMONIA in the last 168 hours. Coagulation Profile: Recent Labs  Lab 07/21/21 0737  INR 1.2   Cardiac Enzymes: No results for input(s): CKTOTAL, CKMB, CKMBINDEX, TROPONINI in the last 168 hours. BNP (last 3 results) No results for input(s): PROBNP in the last 8760 hours. HbA1C: No results for input(s): HGBA1C in the last 72 hours. CBG: Recent Labs  Lab 07/26/21 1125 07/26/21 1613 07/26/21 2107 07/27/21 0929 07/27/21 1232  GLUCAP  189* 216* 238* 204* 205*   Lipid Profile: No results for input(s): CHOL, HDL, LDLCALC, TRIG, CHOLHDL, LDLDIRECT in the last 72 hours. Thyroid Function Tests: No results for input(s): TSH, T4TOTAL, FREET4, T3FREE, THYROIDAB in the last 72 hours. Anemia Panel: No results for input(s): VITAMINB12, FOLATE, FERRITIN, TIBC, IRON, RETICCTPCT in the last 72 hours. Sepsis Labs: Recent Labs  Lab 07/20/21 1824  PROCALCITON <0.10    Recent Results (from the past 240 hour(s))  Resp Panel by RT-PCR (Flu A&B, Covid) Nasopharyngeal Swab     Status: None   Collection Time: 07/20/21  9:35 PM   Specimen: Nasopharyngeal Swab; Nasopharyngeal(NP) swabs in vial transport medium  Result Value Ref Range Status   SARS Coronavirus 2 by RT PCR NEGATIVE NEGATIVE Final    Comment: (NOTE) SARS-CoV-2 target nucleic acids are NOT DETECTED.  The SARS-CoV-2 RNA is generally detectable in upper respiratory specimens during the acute phase of infection. The lowest concentration of SARS-CoV-2 viral copies this assay can detect is 138 copies/mL. A negative result does not preclude SARS-Cov-2 infection and should not be used as the sole basis for treatment or other patient management decisions. A negative result may occur with  improper specimen collection/handling, submission of specimen other than nasopharyngeal swab, presence of  viral mutation(s) within the areas targeted by this assay, and inadequate number of viral copies(<138 copies/mL). A negative result must be combined with clinical observations, patient history, and epidemiological information. The expected result is Negative.  Fact Sheet for Patients:  EntrepreneurPulse.com.au  Fact Sheet for Healthcare Providers:  IncredibleEmployment.be  This test is no t yet approved or cleared by the [place] FDA and  has been authorized for detection and/or diagnosis of SARS-CoV-2 by FDA under an Emergency Use Authorization (EUA). This EUA will remain  in effect (meaning this test can be used) for the duration of the COVID-19 declaration under Section 564(b)(1) of the Act, 21 U.S.C.section 360bbb-3(b)(1), unless the authorization is terminated  or revoked sooner.       Influenza A by PCR NEGATIVE NEGATIVE Final   Influenza B by PCR NEGATIVE NEGATIVE Final    Comment: (NOTE) The Xpert Xpress SARS-CoV-2/FLU/RSV plus assay is intended as an aid in the diagnosis of influenza from Nasopharyngeal swab specimens and should not be used as a sole basis for treatment. Nasal washings and aspirates are unacceptable for Xpert Xpress SARS-CoV-2/FLU/RSV testing.  Fact Sheet for Patients: EntrepreneurPulse.com.au  Fact Sheet for Healthcare Providers: IncredibleEmployment.be  This test is not yet approved or cleared by the [place] FDA and has been authorized for detection and/or diagnosis of SARS-CoV-2 by FDA under an Emergency Use Authorization (EUA). This EUA will remain in effect (meaning this test can be used) for the duration of the COVID-19 declaration under Section 564(b)(1) of the Act, 21 U.S.C. section 360bbb-3(b)(1), unless the authorization is terminated or revoked.  Performed at [facility], [address] [place], [place] [address]          Radiology  Studies: No results found.      Scheduled Meds:  allopurinol  100 mg Oral Daily   amantadine  100 mg Oral BID   carbidopa-levodopa  1 tablet Oral QHS   carbidopa-levodopa  2.5 tablet Oral QID   docusate sodium  100 mg Oral BID   enoxaparin (LOVENOX) injection  40 mg Subcutaneous QHS   ferrous sulfate  325 mg Oral Q breakfast   fluticasone  1 spray Each Nare Daily   furosemide  60 mg  Intravenous Q12H   gabapentin  300 mg Oral QHS   insulin aspart  0-9 Units Subcutaneous TID WC   ipratropium-albuterol  3 mL Nebulization Q6H   levothyroxine  75 mcg Oral Q0600   losartan  50 mg Oral Daily   methocarbamol  500 mg Oral TID   metolazone  2.5 mg Oral Daily   metoprolol succinate  50 mg Oral Q breakfast   pravastatin  40 mg Oral q1800   Continuous Infusions:     LOS: 7 days    Time spent: 25 minutes    Sidney Ace, MD [facility] Pager [phone]  If 7PM-7AM, please contact night-coverage 07/27/2021, 1:48 PM

## 2021-07-27 NOTE — Progress Notes (Signed)
Physical Therapy Treatment Patient Details Name: Stephanie [name] MRN: [id number] DOB: Mar 31, 1946 Today's Date: 07/27/2021   History of Present Illness 75 year old female patient with PMH significant for chronic systolic CHF (most recent EF = 40-45%), LBBB, hypertension, hyperlipidemia, diabetes mellitus type 2, chronic iron deficiency anemia, Parkinson's disease and obesity who presented to the ER due to worsening shortness of breath and found to be in CHF exacerbation.    PT Comments    Patient sitting up on bed side commode on arrival to room (she states she prefers this to the recliner chair.) She appears restless at times and needs cues for attention to task. Patient reports just getting comfortable on the chair and declined standing or ambulation at this time. Patient participated with LE strengthening exercises in sitting position with cues for technique. Sp02 100%. Educated patient on routine mobility for conditioning. Recommend to continue PT to maximize independence and facilitate return to prior level of function.    Recommendations for follow up therapy are one component of a multi-disciplinary discharge planning process, led by the attending physician.  Recommendations may be updated based on patient status, additional functional criteria and insurance authorization.  Follow Up Recommendations  Home health PT     Equipment Recommendations  None recommended by PT    Recommendations for Other Services       Precautions / Restrictions Precautions Precautions: Fall Restrictions Weight Bearing Restrictions: No     Mobility  Bed Mobility               General bed mobility comments: not observed as patient sitting up on arrival to room and post session    Transfers                 General transfer comment: patient declined standing due to just getting comfortable in chair. patient complaining of chronic pain "all over". she does report she walked in  hallway yesterday and has been ambulating to bathroom with staff assistance  Ambulation/Gait                 Stairs             Wheelchair Mobility    Modified Rankin (Stroke Patients Only)       Balance Overall balance assessment: History of Falls Sitting-balance support: Feet supported Sitting balance-Leahy Scale: Good                                      Cognition Arousal/Alertness: Awake/alert Behavior During Therapy: Restless Overall Cognitive Status: Within Functional Limits for tasks assessed                                 General Comments: tangential speech at times, needs cues for redirection at times      Exercises General Exercises - Lower Extremity Ankle Circles/Pumps: AROM;Strengthening;Both;10 reps;Seated Long Arc Quad: AAROM;Strengthening;Both;10 reps;Seated Straight Leg Raises: AAROM;Strengthening;Both;10 reps;Seated Hip Flexion/Marching: AAROM;AROM;Strengthening;Both;10 reps;Seated Other Exercises Other Exercises: verbal and visual cues for exercise technique for strengthening. no increased pain is reported with LE exercises. patient educated on routine mobility for conditioning    General Comments        Pertinent Vitals/Pain Pain Assessment: Faces Faces Pain Scale: Hurts a little bit Pain Location: "all over" Pain Descriptors / Indicators: Discomfort Pain Intervention(s): Limited activity within patient's tolerance    Home  Living                      Prior Function            PT Goals (current goals can now be found in the care plan section) Acute Rehab PT Goals Patient Stated Goal: to go home tomorrow PT Goal Formulation: With patient Time For Goal Achievement: 08/04/21 Potential to Achieve Goals: Good Progress towards PT goals: Progressing toward goals    Frequency    Min 2X/week      PT Plan Current plan remains appropriate    Co-evaluation              AM-PAC  PT "6 Clicks" Mobility   Outcome Measure  Help needed turning from your back to your side while in a flat bed without using bedrails?: A Little Help needed moving from lying on your back to sitting on the side of a flat bed without using bedrails?: A Lot Help needed moving to and from a bed to a chair (including a wheelchair)?: A Little Help needed standing up from a chair using your arms (e.g., wheelchair or bedside chair)?: A Little Help needed to walk in hospital room?: A Little Help needed climbing 3-5 steps with a railing? : A Lot 6 Click Score: 16    End of Session Equipment Utilized During Treatment: Oxygen       PT Visit Diagnosis: Muscle weakness (generalized) (M62.81);Difficulty in walking, not elsewhere classified (R26.2);Unsteadiness on feet (R26.81)     Time: 9024-0973 PT Time Calculation (min) (ACUTE ONLY): 16 min  Charges:  $Therapeutic Exercise: 8-22 mins                     Stephanie [name], PT, MPT   Percell Locus 07/27/2021, 12:31 PM

## 2021-07-27 NOTE — Progress Notes (Signed)
[facility]  Patient Description: Stephanie [name] is a 75 year old female patient with PMH significant for chronic systolic CHF (most recent EF = 40-45%), LBBB, hypertension, hyperlipidemia, diabetes mellitus type 2, chronic iron deficiency anemia, Parkinson's disease and obesity who presented to the ER due to worsening shortness of breath and found to be in CHF exacerbation, thus Cardiology was consulted.   Interval history:  - -1.2 L yesterday.  - No acute events.  - Feels good today. Wants to go home tomorrow.    Vitals:   07/27/21 0105 07/27/21 0432 07/27/21 0442 07/27/21 0737  BP: 117/66 (!) 132/97    Pulse: 80 78    Resp: 16 16    Temp: 97.8 F (36.6 C) (!) 96.6 F (35.9 C)    TempSrc: Oral Axillary    SpO2: 96% 98%  98%  Weight:   85.4 kg   Height:         Intake/Output Summary (Last 24 hours) at 07/27/2021 0913 Last data filed at 07/27/2021 0534 Gross per 24 hour  Intake 1320 ml  Output 2600 ml  Net -1280 ml       PHYSICAL EXAM  General: Well developed, well nourished, in no acute distress HEENT:  Normocephalic and atramatic, facial bruising noted to the right of the face Neck:   No JVD.  Lungs: Clear bilaterally to auscultation. Chest expansion symmetrical, no wheezes, rales or rhonchi Heart: HRRR . Normal S1 and S2 without gallops or murmurs.  Abdomen: Bowel sounds are positive, abdomen edematous and slightly tender.  Msk:  Normal strength and tone for age. Extremities: moderate, 2 pitting edema noted to the BLE with erythema. No clubbing or cyanosis  Neuro: Alert and oriented X 3. Psych:  Good affect, responds appropriately   LABS: Basic Metabolic Panel: Recent Labs    07/26/21 0541 07/27/21 0558  NA 132* 132*  K 4.1 3.6  CL 91* 84*  CO2 32 38*  GLUCOSE 137* 148*  BUN 17 16  CREATININE 0.59 0.47  CALCIUM 8.3* 8.5*  MG 1.7 1.6*    Liver Function Tests: No results for input(s): AST, ALT, ALKPHOS, BILITOT, PROT, ALBUMIN in the last 72  hours.  No results for input(s): LIPASE, AMYLASE in the last 72 hours. CBC: No results for input(s): WBC, NEUTROABS, HGB, HCT, MCV, PLT in the last 72 hours.  Cardiac Enzymes: No results for input(s): CKTOTAL, CKMB, CKMBINDEX, TROPONINI in the last 72 hours. BNP: Invalid input(s): POCBNP D-Dimer: No results for input(s): DDIMER in the last 72 hours.  Hemoglobin A1C: No results for input(s): HGBA1C in the last 72 hours. Fasting Lipid Panel: No results for input(s): CHOL, HDL, LDLCALC, TRIG, CHOLHDL, LDLDIRECT in the last 72 hours. Thyroid Function Tests: No results for input(s): TSH, T4TOTAL, T3FREE, THYROIDAB in the last 72 hours.  Invalid input(s): FREET3 Anemia Panel: No results for input(s): VITAMINB12, FOLATE, FERRITIN, TIBC, IRON, RETICCTPCT in the last 72 hours.  No results found.   Echo: results pending   TELEMETRY: LBBB  ASSESSMENT AND PLAN:  Principal Problem:   Acute on chronic systolic (congestive) heart failure (HCC) Active Problems:   SOB (shortness of breath)   Acute respiratory failure with hypoxia (HCC)   Diabetes mellitus without complication (HCC)   Hypothyroid   Parkinson's disease (HCC)   Hyperlipidemia    Acute on chronic systolic CHF with reduced EF,  fairly stable at this time, patient appears hypervolemic at this time with dyspnea and BLE edema, Echocardiogram is remarkable for severe LV dysfunction with  estimated EF of 25-30%  Continuous cardiac telemetry monitoring until discharge. Continue diuresis with IV Lasix 60 mg BID.  Please place patient on strict I/O so we can assess how well she is responding.  We can attempt PO diuretic tomorrow if patient does well today.  We will continue Losartan 50mg  once daily. Recommend transitioning to entresto as outpatient.  Monitor strict I's and O's. Perform daily weights. Low-sodium, heart healthy diet. Recommend HF clinic referral.    2. Dyspnea, chronic, likely due to underlying CHF with  exacerbation at this time Continue supplemental oxygen therapy. Consider RT evaluation.   3. Elevated D-dimer  CT negative for Pulmonary embolus.    4. HTN, reasonably controlled, patient is normotensive at this time Continue management with ARB inhibitor and beta-blocker therapy. Monitor blood pressure per protocol.   5. HLD, reasonably stable Continue statin therapy.    6. DM2, fairly controlled Continue CBG monitoring and sliding scale insulin per protocol.      Andrez Grime, MD 07/27/2021 9:13 AM

## 2021-07-28 DIAGNOSIS — I5023 Acute on chronic systolic (congestive) heart failure: Secondary | ICD-10-CM | POA: Diagnosis not present

## 2021-07-28 LAB — BASIC METABOLIC PANEL
Anion gap: 11 (ref 5–15)
BUN: 20 mg/dL (ref 8–23)
CO2: 38 mmol/L — ABNORMAL HIGH (ref 22–32)
Calcium: 8.7 mg/dL — ABNORMAL LOW (ref 8.9–10.3)
Chloride: 82 mmol/L — ABNORMAL LOW (ref 98–111)
Creatinine, Ser: 0.57 mg/dL (ref 0.44–1.00)
GFR, Estimated: >60 mL/min (ref >60)
Glucose, Bld: 154 mg/dL — ABNORMAL HIGH (ref 70–99)
Potassium: 3.9 mmol/L (ref 3.5–5.1)
Sodium: 131 mmol/L — ABNORMAL LOW (ref 135–145)

## 2021-07-28 LAB — MAGNESIUM: Magnesium: 1.6 mg/dL — ABNORMAL LOW (ref 1.7–2.4)

## 2021-07-28 LAB — GLUCOSE, CAPILLARY
Glucose-Capillary: 135 mg/dL — ABNORMAL HIGH (ref 70–99)
Glucose-Capillary: 146 mg/dL — ABNORMAL HIGH (ref 70–99)
Glucose-Capillary: 330 mg/dL — ABNORMAL HIGH (ref 70–99)

## 2021-07-28 MED ORDER — TORSEMIDE 20 MG PO TABS
40.0000 mg | ORAL_TABLET | Freq: Two times a day (BID) | ORAL | Status: DC
  Administered 2021-07-28: 40 mg via ORAL
  Filled 2021-07-28: qty 2

## 2021-07-28 MED ORDER — METOLAZONE 2.5 MG PO TABS: 2.5000 mg | ORAL_TABLET | Freq: Every day | ORAL | 0 refills | Status: DC

## 2021-07-28 MED ORDER — LOSARTAN POTASSIUM 50 MG PO TABS: 50.0000 mg | ORAL_TABLET | Freq: Every day | ORAL | 0 refills | Status: DC

## 2021-07-28 MED ORDER — MAGNESIUM SULFATE 2 GM/50ML IV SOLN
2.0000 g | Freq: Once | INTRAVENOUS | Status: AC
  Administered 2021-07-28: 2 g via INTRAVENOUS
  Filled 2021-07-28: qty 50

## 2021-07-28 MED ORDER — POTASSIUM CHLORIDE CRYS ER 20 MEQ PO TBCR
40.0000 meq | EXTENDED_RELEASE_TABLET | Freq: Once | ORAL | Status: AC
  Administered 2021-07-28: 40 meq via ORAL
  Filled 2021-07-28: qty 2

## 2021-07-28 MED ORDER — TORSEMIDE 20 MG PO TABS: 40.0000 mg | ORAL_TABLET | Freq: Every day | ORAL | 0 refills | Status: DC

## 2021-07-28 NOTE — Discharge Summary (Signed)
Physician Discharge Summary  AIVA MISKELL QQV:[id number] DOB: 1946-07-12 DOA: 07/20/2021  PCP: [name], MD  Admit date: 07/20/2021 Discharge date: 07/28/2021  Admitted From: Home Disposition: Home with home health  Recommendations for Outpatient Follow-up:  Follow up with PCP in 1-2 weeks Follow-up with cardiology in 1 week  Home Health: Yes Equipment/Devices: Oxygen 2 L  Discharge Condition: Stable CODE STATUS: Full Diet recommendation: Heart Healthy  Brief/Interim Summary: Hx hfpef, last hospitalized for such at [facility] 11/2020, here with at least several weeks progressively worsening dyspnea on exertion, found to have decompensated heart failure.   CT angiography of the thorax ruled out PE however was mentioned density in esophagus possible impacted food bolus.  On interview patient states that she has had years of esophageal dysmotility.  Review of medical records significant for EGD and colonoscopy in 2020.  Patchy moderate inflammation characterized by erosions and erythema in gastric body and antrum.   Cardiology following.  Patient on diuresis.  Continues to be short of breath.  Has been diuresing.  Modified barium swallow demonstrates high risk for aspiration.  Per SLP do not recommend barium esophagram at this time.  CT soft tissue neck redemonstrates esophageal lesion of unclear etiology.   Discharge Diagnoses:  Principal Problem:   Acute on chronic systolic (congestive) heart failure (HCC) Active Problems:   SOB (shortness of breath)   Acute respiratory failure with hypoxia (HCC)   Diabetes mellitus without complication (HCC)   Hypothyroid   Parkinson's disease (HCC)   Hyperlipidemia  Acute on chronic systolic congestive heart failure Acute hypoxic respiratory failure pulmonary hypertension EF 1/22 at [facility] 55%, moderate pulmonary hypertension.  Repeat echocardiogram on 9/20 EF 25 to 30% Here bnp elevated to 900s, lower extremity edema, o2 in mid 80s  improved to normal on 2 L.  Has intermittent need for o2 at home, usually at night.  Has received lasix here w/ symptomatic improvement.  Initial troponin neg, no chest pain.  Likely underlying osa.  Dry weight per daughter 55; 195 here. Dimer is elevated CT angio ruled out PE -Urine output improved Plan: Can discharge home with home health.  At time of discharge discussed with cardiology.  Will transition to torsemide 40 mg twice daily.  Added Zaroxolyn 2.5 mg daily.  Discontinued home Aldactone.  Beta-blocker and ACE inhibitor also prescribed.  Patient will need close follow-up with cardiology postdischarge.  Established with Dr. Nehemiah Massed.    Possible esophageal foreign body Unclear etiology, noted on CT angio SLP evaluation complete MBSS complete Patient high risk for aspiration, do not recommend barium swallow at this time Soft tissue neck redemonstrates lesion of unclear etiology Plan: Per SLP patient is not a good candidate for barium esophagram as she represents a high aspiration risk.  Currently patient is stable and tolerating p.o. intake without issue.  Can defer endoscopic evaluation to outpatient.  Patient did not have any issues swallowing during hospitalization.  Very low suspicion for impacted food bolus.  Recommend outpatient referral to gastroenterology for endoscopic evaluation.  Of note previous endoscopy was approximately 1 year ago and did reveal some esophageal dysmotility but no distinct soft tissue lesions.  Discharge Instructions  Discharge Instructions     Diet - low sodium heart healthy   Complete by: As directed    Increase activity slowly   Complete by: As directed    No wound care   Complete by: As directed       Allergies as of 07/28/2021   No Known  Allergies      Medication List     STOP taking these medications    colchicine 0.6 MG tablet   enalapril 20 MG tablet Commonly known as: VASOTEC   furosemide 40 MG tablet Commonly known as:  LASIX   meloxicam 15 MG tablet Commonly known as: MOBIC   pantoprazole 40 MG tablet Commonly known as: Protonix   pioglitazone 30 MG tablet Commonly known as: ACTOS   spironolactone 50 MG tablet Commonly known as: ALDACTONE       TAKE these medications    allopurinol 100 MG tablet Commonly known as: ZYLOPRIM Take 100 mg by mouth daily.   amantadine 100 MG capsule Commonly known as: SYMMETREL Take 100 mg by mouth 2 (two) times daily.   carbidopa-levodopa 25-100 MG tablet Commonly known as: SINEMET IR Take 2.5 tablets by mouth 4 (four) times daily.   carbidopa-levodopa 50-200 MG tablet Commonly known as: SINEMET CR Take 1 tablet by mouth at bedtime.   docusate sodium 100 MG capsule Commonly known as: COLACE Take 100 mg by mouth 2 (two) times daily.   ferrous sulfate 325 (65 FE) MG tablet Take 325 mg by mouth daily with breakfast.   fluticasone 50 MCG/ACT nasal spray Commonly known as: FLONASE Place 1-2 sprays into the nose as needed.   FreeStyle Libre 14 Day Sensor Misc by Does not apply route.   gabapentin 300 MG capsule Commonly known as: NEURONTIN Take 300 mg by mouth at bedtime.   glucose 4 GM chewable tablet Chew 1 tablet by mouth as needed for low blood sugar.   GOODY HEADACHE PO Take by mouth.   levothyroxine 75 MCG tablet Commonly known as: SYNTHROID Take 75 mcg by mouth daily. What changed: Another medication with the same name was removed. Continue taking this medication, and follow the directions you see here.   losartan 50 MG tablet Commonly known as: COZAAR Take 1 tablet (50 mg total) by mouth daily. Start taking on: July 29, 2021   MAGNESIUM PO Take by mouth daily.   metFORMIN 1000 MG tablet Commonly known as: GLUCOPHAGE Take 1,000 mg by mouth 2 (two) times daily with a meal.   metolazone 2.5 MG tablet Commonly known as: ZAROXOLYN Take 1 tablet (2.5 mg total) by mouth daily. Start taking on: July 29, 2021    metoprolol succinate 50 MG 24 hr tablet Commonly known as: TOPROL-XL Take 50 mg by mouth daily. Take with or immediately following a meal.   multivitamin with minerals Tabs tablet Take 1 tablet by mouth daily.   oxyCODONE-acetaminophen 5-325 MG tablet Commonly known as: PERCOCET/ROXICET Take 1 tablet by mouth every 6 (six) hours as needed for severe pain.   OXYGEN Inhale into the lungs as needed.   pramipexole 0.125 MG tablet Commonly known as: MIRAPEX Take 0.125 mg by mouth 4 (four) times daily as needed.   pravastatin 40 MG tablet Commonly known as: PRAVACHOL Take 40 mg by mouth daily.   torsemide 20 MG tablet Commonly known as: Demadex Take 2 tablets (40 mg total) by mouth daily.   VITAMIN B-12 PO Take by mouth daily.        Follow-up Information     [name], MD. Schedule an appointment as soon as possible for a visit in 1 week(s).   Specialty: Internal Medicine Contact information: [place] [address] [phone]         Corey Skains, MD. Schedule an appointment as soon as possible for a  visit in 2 week(s).   Specialty: Cardiology Contact information: [facility] [place] [address] [phone]                No Known Allergies  Consultations: Cardiology-[facility]   Procedures/Studies: DG Chest 2 View  Result Date: 07/20/2021 CLINICAL DATA:  Shortness of breath. EXAM: CHEST - 2 VIEW COMPARISON:  July 06, 2021 FINDINGS: Mild, chronic appearing diffusely increased interstitial lung markings are seen. Mild, stable areas of linear scarring and/or atelectasis are noted within the bilateral lung bases. There is no evidence of a pleural effusion or pneumothorax. The cardiac silhouette is mildly enlarged and unchanged in size. Degenerative changes seen throughout the thoracic spine. IMPRESSION: 1. Stable cardiomegaly with mild, stable bibasilar linear  scarring and/or atelectasis. Electronically Signed   By: Virgina Norfolk M.D.   On: 07/20/2021 19:08   DG Chest 2 View  Result Date: 07/06/2021 CLINICAL DATA:  Shortness of breath EXAM: CHEST - 2 VIEW COMPARISON:  12/05/2020 FINDINGS: Cardiomegaly. Right mid lung and lingular atelectasis. Mild vascular congestion. No overt edema. No effusions or acute bony abnormality. IMPRESSION: Cardiomegaly, vascular congestion. Areas of atelectasis bilaterally. Electronically Signed   By: Rolm Baptise M.D.   On: 07/06/2021 22:03   DG Wrist Complete Right  Result Date: 07/23/2021 CLINICAL DATA:  Wrist pain following recent transportation move, initial encounter. EXAM: RIGHT WRIST - COMPLETE 3+ VIEW COMPARISON:  None. FINDINGS: Degenerative changes of the first [facility] joint and intercarpal joint are noted. A cystic lesion is noted in the distal ulna which appears benign in etiology. No acute fracture or dislocation is noted. Soft tissue swelling in the dorsum of the hand is seen. IMPRESSION: Degenerative change without acute abnormality. Mild soft tissue swelling is seen. Electronically Signed   By: Inez Catalina M.D.   On: 07/23/2021 16:24   DG Abd 1 View  Result Date: 07/24/2021 CLINICAL DATA:  Abdominal pain and distension. EXAM: ABDOMEN - 1 VIEW COMPARISON:  None. FINDINGS: The bowel gas pattern is normal. Radiopaque contrast is noted throughout the ascending colon. There is subsequently limited evaluation for radiopaque renal calculi. IMPRESSION: 1. Normal bowel gas pattern. 2. Radiopaque contrast throughout the ascending and transverse colon. Electronically Signed   By: Virgina Norfolk M.D.   On: 07/24/2021 15:39   CT HEAD WO CONTRAST (5MM)  Result Date: 07/06/2021 CLINICAL DATA:  Fall EXAM: CT HEAD WITHOUT CONTRAST TECHNIQUE: Contiguous axial images were obtained from the base of the skull through the vertex without intravenous contrast. COMPARISON:  None. FINDINGS: Brain: There is atrophy and chronic small  vessel disease changes. No acute intracranial abnormality. Specifically, no hemorrhage, hydrocephalus, mass lesion, acute infarction, or significant intracranial injury. Vascular: No hyperdense vessel or unexpected calcification. Skull: No acute calvarial abnormality. Sinuses/Orbits: No acute findings Other: None IMPRESSION: Atrophy, chronic microvascular disease. No acute intracranial abnormality. Electronically Signed   By: Rolm Baptise M.D.   On: 07/06/2021 22:32   CT SOFT TISSUE NECK W CONTRAST  Result Date: 07/24/2021 CLINICAL DATA:  Esophageal mass EXAM: CT NECK WITH CONTRAST TECHNIQUE: Multidetector CT imaging of the neck was performed using the standard protocol following the bolus administration of intravenous contrast. CONTRAST:  56mL OMNIPAQUE IOHEXOL 350 MG/ML SOLN COMPARISON:  CTA chest 07/22/2021.  CT neck December 31, 2015. FINDINGS: Pharynx and larynx: Normal. No mass or swelling. Salivary glands: No inflammation, mass, or stone. Thyroid: Normal. Lymph nodes: None enlarged or abnormal density. Vascular: Negative. Limited intracranial: Negative. Visualized orbits: Not  imaged. Mastoids and visualized paranasal sinuses: Moderate mucosal thickening of bilateral maxillary sinuses, partially imaged. Skeleton: Severe multilevel degenerative disease with disc height loss, endplate sclerosis and posterior endplate spurring. Upper chest: Somewhat patulous esophagus in the chest superiorly with redemonstrated intraluminal soft tissue content and air inferiorly, partially imaged on this study. Scattered mosaic attenuation within the imaged lung apices, likely related to expiratory imaging. Motion limited evaluation lungs. IMPRESSION: Somewhat patulous esophagus in the chest superiorly with redemonstrated intraluminal soft tissue content and air inferiorly, partially imaged on this study. As mentioned on recent CTA chest, this could be related to ingested/impacted food content. Underlying esophageal lesion and/or  stricture is not excluded. Recommend follow-up endoscopy and/or esophagram. Electronically Signed   By: Margaretha Sheffield M.D.   On: 07/24/2021 16:21   CT Angio Chest Pulmonary Embolism (PE) W or WO Contrast  Result Date: 07/22/2021 CLINICAL DATA:  Acute shortness of breath, swelling, fluid retention, acute on chronic systolic heart failure EXAM: CT ANGIOGRAPHY CHEST WITH CONTRAST TECHNIQUE: Multidetector CT imaging of the chest was performed using the standard protocol during bolus administration of intravenous contrast. Multiplanar CT image reconstructions and MIPs were obtained to evaluate the vascular anatomy. CONTRAST:  71mL OMNIPAQUE IOHEXOL 350 MG/ML SOLN COMPARISON:  12/31/2015 FINDINGS: Cardiovascular: Pulmonary arteries appear patent and normal in caliber. No acute filling defect or pulmonary embolus demonstrated by CTA. Atherosclerotic changes of the aorta. Patent 3 vessel arch anatomy. Negative for aneurysm or acute dissection. No mediastinal hemorrhage or hematoma. Native coronary atherosclerosis noted. Marked cardiomegaly. No pericardial effusion. Central venous structures are patent.  No veno-occlusive process. Mediastinum/Nodes: Limited visualization of the thyroid. Trachea and central airways are patent. Upper thoracic esophagus intraluminal soft tissue content and air noted, images 62 through 91/5. This may represent ingested food, but difficult to exclude underlying esophageal mural lesion or abnormality. Consider follow-up esophageal evaluation such as esophagram. No adenopathy. Lungs/Pleura: Diffuse interlobular septal thickening and patchy ground-glass attenuation, compatible with mild edema. Scattered areas of bandlike opacity throughout the right lung as well as the lingula and left lower lobe favored to be atelectasis. Difficult to exclude early pneumonia. No pleural abnormality, significant effusion, or pneumothorax. Upper Abdomen: Limited assessment. Dilated hepatic IVC when compared  to the prior studies suggesting component of right heart failure. No acute upper abdominal finding. Musculoskeletal: Lower chest subcutaneous edema compatible with anasarca. Degenerative changes throughout the spine. No acute osseous finding. No compression fracture. Sternum intact. Review of the MIP images confirms the above findings. IMPRESSION: Negative for significant acute pulmonary embolus by CTA. Cardiomegaly with mild interstitial pulmonary edema pattern compatible with early CHF. Scattered areas of bilateral bandlike opacities throughout both lungs favored to be atelectasis. Difficult to exclude early pneumonia. Upper thoracic esophagus intraluminal soft tissue content and air may represent ingested impacted food. Underlying esophageal lesion not excluded. Recommend follow-up esophageal evaluation and or esophagram. Lower thoracic anasarca throughout the soft tissues. Aortic Atherosclerosis (ICD10-I70.0). Electronically Signed   By: Jerilynn Mages.  Shick M.D.   On: 07/22/2021 10:45   CT Cervical Spine Wo Contrast  Result Date: 07/06/2021 CLINICAL DATA:  Fall, hit face EXAM: CT CERVICAL SPINE WITHOUT CONTRAST TECHNIQUE: Multidetector CT imaging of the cervical spine was performed without intravenous contrast. Multiplanar CT image reconstructions were also generated. COMPARISON:  None. FINDINGS: Alignment: No subluxation Skull base and vertebrae: No acute fracture. No primary bone lesion or focal pathologic process. Soft tissues and spinal canal: No prevertebral fluid or swelling. No visible canal hematoma. Disc levels: Advanced diffuse degenerative disc  disease and facet disease. Upper chest: No acute findings Other: None IMPRESSION: Advanced degenerative changes.  No acute bony abnormality. Electronically Signed   By: Rolm Baptise M.D.   On: 07/06/2021 22:34   DG Hand 2 View Right  Result Date: 07/23/2021 CLINICAL DATA:  Hand pain following recent transportation move, initial encounter EXAM: RIGHT HAND - 2 VIEW  COMPARISON:  None. FINDINGS: Degenerative changes of the first [facility] joint are noted. Mild carpal row degenerative changes are noted as well. No acute fracture or dislocation is seen. Mild soft tissue swelling is noted particularly over the dorsum of the hand. No other focal abnormality is noted. IMPRESSION: Soft tissue swelling without acute bony abnormality. Electronically Signed   By: Inez Catalina M.D.   On: 07/23/2021 16:23   US Venous Img Lower Bilateral (DVT)  Result Date: 07/21/2021 CLINICAL DATA:  Positive D-dimer Pain Swelling Fall 2-3 weeks ago Color changes EXAM: BILATERAL LOWER EXTREMITY VENOUS DOPPLER ULTRASOUND TECHNIQUE: Gray-scale sonography with compression, as well as color and duplex ultrasound, were performed to evaluate the deep venous system(s) from the level of the common femoral vein through the popliteal and proximal calf veins. COMPARISON:  None. FINDINGS: VENOUS Normal compressibility of the common femoral, superficial femoral, and popliteal veins, as well as the visualized calf veins. Visualized portions of profunda femoral vein and great saphenous vein unremarkable. No filling defects to suggest DVT on grayscale or color Doppler imaging. Doppler waveforms show normal direction of venous flow, normal respiratory plasticity and response to augmentation. OTHER None. Limitations: Calf veins not well visualized due to edema. IMPRESSION: No lower extremity DVT. Limited assessment of the calf veins due to edema. Electronically Signed   By: Miachel Roux M.D.   On: 07/21/2021 14:55   DG Chest Port 1 View  Result Date: 07/24/2021 CLINICAL DATA:  History of heart failure, progressively worsening dyspnea on exertion EXAM: PORTABLE CHEST 1 VIEW COMPARISON:  Chest radiograph 07/20/2021 FINDINGS: The heart is enlarged, unchanged. The mediastinal contours are stable. There are increased interstitial markings bilaterally suspected to reflect mild pulmonary interstitial edema, worsened since the  prior study. There is unchanged asymmetric elevation of the right hemidiaphragm. Linear opacities in the lingula are unchanged and may reflect scar. There is no other focal consolidation. There is no pleural effusion or pneumothorax. There is no acute osseous abnormality. IMPRESSION: Cardiomegaly with suspected mild pulmonary interstitial edema, increased since 07/20/2021. Electronically Signed   By: Valetta Mole M.D.   On: 07/24/2021 12:01   ECHOCARDIOGRAM COMPLETE  Result Date: 07/22/2021    ECHOCARDIOGRAM REPORT   Patient Name:   NASIYAH LAVERDIERE [facility] Date of Exam: 07/21/2021 Medical Rec #:  [id number]          Height:       61.0 in Accession #:    [id number]         Weight:       195.0 lb Date of Birth:  1946-08-21         BSA:          1.869 m Patient Age:    75 years           BP:           103/78 mmHg Patient Gender: F                  HR:           66 bpm. Exam Location:  [facility] Procedure: 2D Echo, Cardiac Doppler and Color Doppler Indications:  U31.49 Acute Systolic CHF  History:         Patient has no prior history of Echocardiogram examinations.                  Risk Factors:Diabetes, Dyslipidemia and Hypertension. Sleep                  apnea. Parkinson's Disease. Dysrhythmia. Hypothyroidism.                  Syncope.  Sonographer:     Cresenciano Lick RDCS Referring Phys:  [id number] Rhetta Mura Diagnosing Phys: Yolonda Kida MD IMPRESSIONS  1. Left ventricular ejection fraction, by estimation, is 25 to 30%. The left ventricle has severely decreased function. The left ventricle demonstrates global hypokinesis. The left ventricular internal cavity size was moderately dilated. Left ventricular diastolic parameters were normal.  2. Right ventricular systolic function is mildly reduced. The right ventricular size is moderately enlarged.  3. Left atrial size was moderately dilated.  4. Right atrial size was mild to moderately dilated.  5. The mitral valve is grossly normal. Severe mitral valve  regurgitation.  6. Tricuspid valve regurgitation is severe.  7. The aortic valve is grossly normal. Aortic valve regurgitation is mild. Mild aortic valve sclerosis is present, with no evidence of aortic valve stenosis. FINDINGS  Left Ventricle: Left ventricular ejection fraction, by estimation, is 25 to 30%. The left ventricle has severely decreased function. The left ventricle demonstrates global hypokinesis. The left ventricular internal cavity size was moderately dilated. There is no left ventricular hypertrophy. Left ventricular diastolic parameters were normal. Right Ventricle: The right ventricular size is moderately enlarged. No increase in right ventricular wall thickness. Right ventricular systolic function is mildly reduced. Left Atrium: Left atrial size was moderately dilated. Right Atrium: Right atrial size was mild to moderately dilated. Pericardium: There is no evidence of pericardial effusion. Mitral Valve: The mitral valve is grossly normal. There is moderate thickening of the mitral valve leaflet(s). Severe mitral valve regurgitation. Tricuspid Valve: The tricuspid valve is grossly normal. Tricuspid valve regurgitation is severe. Aortic Valve: The aortic valve is grossly normal. Aortic valve regurgitation is mild. Aortic regurgitation PHT measures 522 msec. Mild aortic valve sclerosis is present, with no evidence of aortic valve stenosis. Pulmonic Valve: The pulmonic valve was normal in structure. Pulmonic valve regurgitation is trivial. Aorta: The ascending aorta was not well visualized. IAS/Shunts: No atrial level shunt detected by color flow Doppler.  LEFT VENTRICLE PLAX 2D LVIDd:         5.40 cm  Diastology LVIDs:         4.70 cm  LV e' medial:    4.03 cm/s LV PW:         0.90 cm  LV E/e' medial:  24.4 LV IVS:        0.70 cm  LV e' lateral:   10.60 cm/s LVOT diam:     1.70 cm  LV E/e' lateral: 9.3 LV SV:         31 LV SV Index:   17 LVOT Area:     2.27 cm  RIGHT VENTRICLE            IVC RV  Basal diam:  3.50 cm    IVC diam: 2.30 cm RV S prime:     9.03 cm/s TAPSE (M-mode): 1.2 cm LEFT ATRIUM             Index       RIGHT  ATRIUM           Index LA diam:        5.30 cm 2.84 cm/m  RA Area:     18.40 cm LA Vol (A2C):   93.4 ml 49.99 ml/m RA Volume:   53.40 ml  28.58 ml/m LA Vol (A4C):   81.6 ml 43.67 ml/m LA Biplane Vol: 87.6 ml 46.88 ml/m  AORTIC VALVE LVOT Vmax:   64.20 cm/s LVOT Vmean:  51.200 cm/s LVOT VTI:    0.136 m AI PHT:      522 msec  AORTA Ao Root diam: 2.50 cm Ao Asc diam:  3.40 cm MITRAL VALVE               TRICUSPID VALVE MV Area (PHT): 4.31 cm    TR Peak grad:   38.9 mmHg MV Decel Time: 176 msec    TR Vmax:        312.00 cm/s MV E velocity: 98.50 cm/s MV A velocity: 91.30 cm/s  SHUNTS MV E/A ratio:  1.08        Systemic VTI:  0.14 m                            Systemic Diam: 1.70 cm Yolonda Kida MD Electronically signed by Yolonda Kida MD Signature Date/Time: 07/22/2021/12:58:29 PM    Final    CT Maxillofacial Wo Contrast  Result Date: 07/06/2021 CLINICAL DATA:  Fall, hit face EXAM: CT MAXILLOFACIAL WITHOUT CONTRAST TECHNIQUE: Multidetector CT imaging of the maxillofacial structures was performed. Multiplanar CT image reconstructions were also generated. COMPARISON:  None. FINDINGS: Osseous: No fracture or mandibular dislocation. No destructive process. Orbits: Negative. No traumatic or inflammatory finding. Sinuses: Mucosal thickening in the floor of the left maxillary sinus. Soft tissues: Soft tissue swelling over the right orbit and face. Limited intracranial: See head CT report IMPRESSION: No facial or orbital fracture. Electronically Signed   By: Rolm Baptise M.D.   On: 07/06/2021 22:33   (Echo, Carotid, EGD, Colonoscopy, ERCP)    Subjective: Patient seen and examined on the day of discharge.  Volume status at baseline.  Stable for discharge home.  Discharge Exam: Vitals:   07/28/21 0817 07/28/21 1151  BP: 124/66 (!) 110/55  Pulse: 79 71  Resp: 17 18   Temp: 98.1 F (36.7 C) 97.8 F (36.6 C)  SpO2: 95% 94%   Vitals:   07/27/21 2358 07/28/21 0509 07/28/21 0817 07/28/21 1151  BP: (!) 149/91 (!) 150/134 124/66 (!) 110/55  Pulse: 99 67 79 71  Resp: 18 18 17 18   Temp: 97.7 F (36.5 C) 97.7 F (36.5 C) 98.1 F (36.7 C) 97.8 F (36.6 C)  TempSrc:   Oral Oral  SpO2: 96% 92% 95% 94%  Weight:  82.9 kg    Height:        General: Pt is alert, awake, not in acute distress Cardiovascular: RRR, S1/S2 +, no rubs, no gallops Respiratory: CTA bilaterally, no wheezing, no rhonchi Abdominal: Soft, NT, ND, bowel sounds + Extremities: no edema, no cyanosis    The results of significant diagnostics from this hospitalization (including imaging, microbiology, ancillary and laboratory) are listed below for reference.     Microbiology: Recent Results (from the past 240 hour(s))  Resp Panel by RT-PCR (Flu A&B, Covid) Nasopharyngeal Swab     Status: None   Collection Time: 07/20/21  9:35 PM   Specimen: Nasopharyngeal Swab; Nasopharyngeal(NP) swabs in vial  transport medium  Result Value Ref Range Status   SARS Coronavirus 2 by RT PCR NEGATIVE NEGATIVE Final    Comment: (NOTE) SARS-CoV-2 target nucleic acids are NOT DETECTED.  The SARS-CoV-2 RNA is generally detectable in upper respiratory specimens during the acute phase of infection. The lowest concentration of SARS-CoV-2 viral copies this assay can detect is 138 copies/mL. A negative result does not preclude SARS-Cov-2 infection and should not be used as the sole basis for treatment or other patient management decisions. A negative result may occur with  improper specimen collection/handling, submission of specimen other than nasopharyngeal swab, presence of viral mutation(s) within the areas targeted by this assay, and inadequate number of viral copies(<138 copies/mL). A negative result must be combined with clinical observations, patient history, and epidemiological information. The  expected result is Negative.  Fact Sheet for Patients:  EntrepreneurPulse.com.au  Fact Sheet for Healthcare Providers:  IncredibleEmployment.be  This test is no t yet approved or cleared by the [place] FDA and  has been authorized for detection and/or diagnosis of SARS-CoV-2 by FDA under an Emergency Use Authorization (EUA). This EUA will remain  in effect (meaning this test can be used) for the duration of the COVID-19 declaration under Section 564(b)(1) of the Act, 21 U.S.C.section 360bbb-3(b)(1), unless the authorization is terminated  or revoked sooner.       Influenza A by PCR NEGATIVE NEGATIVE Final   Influenza B by PCR NEGATIVE NEGATIVE Final    Comment: (NOTE) The Xpert Xpress SARS-CoV-2/FLU/RSV plus assay is intended as an aid in the diagnosis of influenza from Nasopharyngeal swab specimens and should not be used as a sole basis for treatment. Nasal washings and aspirates are unacceptable for Xpert Xpress SARS-CoV-2/FLU/RSV testing.  Fact Sheet for Patients: EntrepreneurPulse.com.au  Fact Sheet for Healthcare Providers: IncredibleEmployment.be  This test is not yet approved or cleared by the [place] FDA and has been authorized for detection and/or diagnosis of SARS-CoV-2 by FDA under an Emergency Use Authorization (EUA). This EUA will remain in effect (meaning this test can be used) for the duration of the COVID-19 declaration under Section 564(b)(1) of the Act, 21 U.S.C. section 360bbb-3(b)(1), unless the authorization is terminated or revoked.  Performed at [facility], [address] [place], [place] [address]      Labs: BNP (last 3 results) Recent Labs    07/06/21 2223 07/20/21 1824  BNP 680.6* 761.6*   Basic Metabolic Panel: Recent Labs  Lab 07/24/21 0445 07/25/21 0511 07/26/21 0541 07/27/21 0558 07/28/21 0355  NA 133* 135 132* 132* 131*  K 3.8 4.1  4.1 3.6 3.9  CL 92* 92* 91* 84* 82*  CO2 33* 35* 32 38* 38*  GLUCOSE 116* 139* 137* 148* 154*  BUN 12 17 17 16 20   CREATININE 0.61 0.59 0.59 0.47 0.57  CALCIUM 8.1* 8.3* 8.3* 8.5* 8.7*  MG 1.9 1.8 1.7 1.6* 1.6*   Liver Function Tests: No results for input(s): AST, ALT, ALKPHOS, BILITOT, PROT, ALBUMIN in the last 168 hours. No results for input(s): LIPASE, AMYLASE in the last 168 hours. No results for input(s): AMMONIA in the last 168 hours. CBC: No results for input(s): WBC, NEUTROABS, HGB, HCT, MCV, PLT in the last 168 hours. Cardiac Enzymes: No results for input(s): CKTOTAL, CKMB, CKMBINDEX, TROPONINI in the last 168 hours. BNP: Invalid input(s): POCBNP CBG: Recent Labs  Lab 07/27/21 1232 07/27/21 1631 07/27/21 2046 07/28/21 0819 07/28/21 1152  GLUCAP 205* 318* 159* 135* 146*   D-Dimer No results  for input(s): DDIMER in the last 72 hours. Hgb A1c No results for input(s): HGBA1C in the last 72 hours. Lipid Profile No results for input(s): CHOL, HDL, LDLCALC, TRIG, CHOLHDL, LDLDIRECT in the last 72 hours. Thyroid function studies No results for input(s): TSH, T4TOTAL, T3FREE, THYROIDAB in the last 72 hours.  Invalid input(s): FREET3 Anemia work up No results for input(s): VITAMINB12, FOLATE, FERRITIN, TIBC, IRON, RETICCTPCT in the last 72 hours. Urinalysis    Component Value Date/Time   COLORURINE YELLOW (A) 07/24/2021 0955   APPEARANCEUR HAZY (A) 07/24/2021 0955   LABSPEC 1.014 07/24/2021 0955   PHURINE 6.0 07/24/2021 0955   GLUCOSEU NEGATIVE 07/24/2021 0955   HGBUR NEGATIVE 07/24/2021 0955   BILIRUBINUR NEGATIVE 07/24/2021 0955   KETONESUR 5 (A) 07/24/2021 0955   PROTEINUR NEGATIVE 07/24/2021 0955   NITRITE NEGATIVE 07/24/2021 0955   LEUKOCYTESUR SMALL (A) 07/24/2021 0955   Sepsis Labs Invalid input(s): PROCALCITONIN,  WBC,  LACTICIDVEN Microbiology Recent Results (from the past 240 hour(s))  Resp Panel by RT-PCR (Flu A&B, Covid) Nasopharyngeal Swab      Status: None   Collection Time: 07/20/21  9:35 PM   Specimen: Nasopharyngeal Swab; Nasopharyngeal(NP) swabs in vial transport medium  Result Value Ref Range Status   SARS Coronavirus 2 by RT PCR NEGATIVE NEGATIVE Final    Comment: (NOTE) SARS-CoV-2 target nucleic acids are NOT DETECTED.  The SARS-CoV-2 RNA is generally detectable in upper respiratory specimens during the acute phase of infection. The lowest concentration of SARS-CoV-2 viral copies this assay can detect is 138 copies/mL. A negative result does not preclude SARS-Cov-2 infection and should not be used as the sole basis for treatment or other patient management decisions. A negative result may occur with  improper specimen collection/handling, submission of specimen other than nasopharyngeal swab, presence of viral mutation(s) within the areas targeted by this assay, and inadequate number of viral copies(<138 copies/mL). A negative result must be combined with clinical observations, patient history, and epidemiological information. The expected result is Negative.  Fact Sheet for Patients:  EntrepreneurPulse.com.au  Fact Sheet for Healthcare Providers:  IncredibleEmployment.be  This test is no t yet approved or cleared by the [place] FDA and  has been authorized for detection and/or diagnosis of SARS-CoV-2 by FDA under an Emergency Use Authorization (EUA). This EUA will remain  in effect (meaning this test can be used) for the duration of the COVID-19 declaration under Section 564(b)(1) of the Act, 21 U.S.C.section 360bbb-3(b)(1), unless the authorization is terminated  or revoked sooner.       Influenza A by PCR NEGATIVE NEGATIVE Final   Influenza B by PCR NEGATIVE NEGATIVE Final    Comment: (NOTE) The Xpert Xpress SARS-CoV-2/FLU/RSV plus assay is intended as an aid in the diagnosis of influenza from Nasopharyngeal swab specimens and should not be used as a sole basis  for treatment. Nasal washings and aspirates are unacceptable for Xpert Xpress SARS-CoV-2/FLU/RSV testing.  Fact Sheet for Patients: EntrepreneurPulse.com.au  Fact Sheet for Healthcare Providers: IncredibleEmployment.be  This test is not yet approved or cleared by the [place] FDA and has been authorized for detection and/or diagnosis of SARS-CoV-2 by FDA under an Emergency Use Authorization (EUA). This EUA will remain in effect (meaning this test can be used) for the duration of the COVID-19 declaration under Section 564(b)(1) of the Act, 21 U.S.C. section 360bbb-3(b)(1), unless the authorization is terminated or revoked.  Performed at [facility], [address] [place], [place] [address]  Time coordinating discharge: Over 30 minutes  SIGNED:   Sidney Ace, MD  [facility] 07/28/2021, 2:02 PM Pager   If 7PM-7AM, please contact night-coverage

## 2021-07-28 NOTE — Progress Notes (Signed)
[facility] Cardiology  Patient Description: Stephanie [name] is a 75 year old female patient with PMH significant for chronic systolic CHF (most recent EF = 40-45%), LBBB, hypertension, hyperlipidemia, diabetes mellitus type 2, chronic iron deficiency anemia, Parkinson's disease and obesity who presented to the ER due to worsening shortness of breath and found to be in CHF exacerbation, thus Cardiology was consulted.   Interval history:  - -1.2 L yesterday.  - No acute events.  - Patient sleeping and would not wake at the time of my interview.   Vitals:   07/27/21 1936 07/27/21 1952 07/27/21 2358 07/28/21 0509  BP: 130/75  (!) 149/91 (!) 150/134  Pulse: 76  99 67  Resp: 18  18 18   Temp: (!) 97.5 F (36.4 C)  97.7 F (36.5 C) 97.7 F (36.5 C)  TempSrc:      SpO2: 95% 94% 96% 92%  Weight:    82.9 kg  Height:         Intake/Output Summary (Last 24 hours) at 07/28/2021 0802 Last data filed at 07/28/2021 0606 Gross per 24 hour  Intake 1680 ml  Output 2900 ml  Net -1220 ml       PHYSICAL EXAM  General: Well developed, well nourished, in no acute distress HEENT:  Normocephalic and atramatic, facial bruising noted to the right of the face Neck:   No JVD.  Lungs: Clear bilaterally to auscultation. Chest expansion symmetrical, no wheezes, rales or rhonchi Heart: HRRR . Normal S1 and S2 without gallops or murmurs.  Abdomen: Bowel sounds are positive, abdomen edematous and slightly tender.  Msk:  Normal strength and tone for age. Extremities: moderate, 2 pitting edema noted to the BLE with erythema. No clubbing or cyanosis  Neuro: Alert and oriented X 3. Psych:  Good affect, responds appropriately   LABS: Basic Metabolic Panel: Recent Labs    07/27/21 0558 07/28/21 0355  NA 132* 131*  K 3.6 3.9  CL 84* 82*  CO2 38* 38*  GLUCOSE 148* 154*  BUN 16 20  CREATININE 0.47 0.57  CALCIUM 8.5* 8.7*  MG 1.6* 1.6*    Liver Function Tests: No results for input(s): AST, ALT, ALKPHOS,  BILITOT, PROT, ALBUMIN in the last 72 hours.  No results for input(s): LIPASE, AMYLASE in the last 72 hours. CBC: No results for input(s): WBC, NEUTROABS, HGB, HCT, MCV, PLT in the last 72 hours.  Cardiac Enzymes: No results for input(s): CKTOTAL, CKMB, CKMBINDEX, TROPONINI in the last 72 hours. BNP: Invalid input(s): POCBNP D-Dimer: No results for input(s): DDIMER in the last 72 hours.  Hemoglobin A1C: No results for input(s): HGBA1C in the last 72 hours. Fasting Lipid Panel: No results for input(s): CHOL, HDL, LDLCALC, TRIG, CHOLHDL, LDLDIRECT in the last 72 hours. Thyroid Function Tests: No results for input(s): TSH, T4TOTAL, T3FREE, THYROIDAB in the last 72 hours.  Invalid input(s): FREET3 Anemia Panel: No results for input(s): VITAMINB12, FOLATE, FERRITIN, TIBC, IRON, RETICCTPCT in the last 72 hours.  No results found.   Echo: results pending   TELEMETRY: LBBB  ASSESSMENT AND PLAN:  Principal Problem:   Acute on chronic systolic (congestive) heart failure (HCC) Active Problems:   SOB (shortness of breath)   Acute respiratory failure with hypoxia (HCC)   Diabetes mellitus without complication (HCC)   Hypothyroid   Parkinson's disease (HCC)   Hyperlipidemia    Acute on chronic systolic CHF with reduced EF,  fairly stable at this time, patient appears hypervolemic at this time with dyspnea and BLE edema, Echocardiogram  is remarkable for severe LV dysfunction with estimated EF of 25-30%  Continuous cardiac telemetry monitoring until discharge. Labs show contraction alkalosis today. Stop IV diuresis and transition to torsemide 40 mg BID. Continue metolazone 2.5 mg daily.  We will continue Losartan 50mg  once daily. Recommend transitioning to entresto as outpatient.  Monitor strict I's and O's. Perform daily weights. Low-sodium, heart healthy diet. Recommend HF clinic referral.  Follow up with Dr. Clayborn Bigness 1-2 weeks after DC. Please call to coordinate.    2.  Dyspnea, chronic, likely due to underlying CHF with exacerbation at this time Continue supplemental oxygen therapy. Consider RT evaluation.   3. Elevated D-dimer  CT negative for Pulmonary embolus.    4. HTN, reasonably controlled, patient is normotensive at this time Continue management with ARB inhibitor and beta-blocker therapy. Monitor blood pressure per protocol.   5. HLD, reasonably stable Continue statin therapy.    6. DM2, fairly controlled Continue CBG monitoring and sliding scale insulin per protocol.      Andrez Grime, MD 07/28/2021 8:02 AM

## 2021-07-28 NOTE — Progress Notes (Signed)
Mobility Specialist - Progress Note   07/28/21 1100  Mobility  Activity Ambulated in hall;Transferred to/from [facility]  Level of Assistance Standby assist, set-up cues, supervision of patient - no hands on  Assistive Device Front wheel walker  Distance Ambulated (ft) 220 ft  Mobility Out of bed for toileting;Ambulated with assistance in hallway;Out of bed to chair with meals  Mobility Response Tolerated well  Mobility performed by Mobility specialist  $Mobility charge 1 Mobility    Pre-mobility: 76 HR, 92% SpO2 During mobility: 103 HR, 85-93% SpO2 Post-mobility: 74 HR, 97% SpO2   Pt lying in bed upon arrival with [place] doffed but on 2L. O2 sitting at 92% resting on RA, but pt does voice feeling SOB and O2 reapplied. C/o nausea, headache, and jaw pain. RN notified and entered to address concerns, given okay to proceed. Pt sat EOB with minA. Does voice dizziness and lightheadedness during session but denies blurred vision. Ambulated to [facility] for urinary output prior to continuation into hallway. Mildly unsteady, but no LOB. Few standing rest breaks taken as pt voices SOB. O2 desat to 85% on 2L and bumped up to 3L for remainder of activity with sats > 90%. Pt returned to guest chair prior to exit, back on 2L. RN notified of performance.    Kathee Delton Mobility Specialist 07/28/21, 11:37 AM

## 2021-07-29 ENCOUNTER — Ambulatory Visit: Payer: Medicare Other | Admitting: Family

## 2021-08-05 NOTE — Progress Notes (Signed)
Patient ID: Stephanie [name], female    DOB: [birth year], 75 y.o.   MRN: [id number]  HPI  Stephanie [name] is a 75 y/o female with a history of Dm, hyperlipidemia, HTN, thyroid disease, anemia, GERD, Parkinson's disease, sleep apnea and chronic heart failure.   Echo report from 07/21/21 reviewed and showed an EF of 25-30% along with severe MR/TR and mild AR.   Admitted 07/20/21 due to acute on chronic heart failure. Negative for PE. Initially given IV lasix with transition to oral medications. Cardiology consult obtained. Possible foreign body noted in esophagus from CT. SLP evaluation done. Discharged after 8 days. Was in the ED 07/06/21 after a mechanical fall where she was treated and released.   She presents today for her initial visit with chief complaint of minimal fatigue upon moderate exertion. She describes this as chronic in nature having been present for several months. She has associated pedal edema (improving), abdominal distention (improving), light-headedness and easy bruising along with this. She denies any difficulty sleeping, palpitations, chest pain, shortness of breath, cough or weight gain.   Had a fall prior to her recent admission where she hit her forehead. Now wearing a protective "helmet" on her head in case she falls again.   Says that she feels "much better" since she got home. Says that she lost almost 40 pounds during admission.   Past Medical History:  Diagnosis Date   Anemia    Arthritis    CHF (congestive heart failure) (HCC)    Chronic back pain    Diabetes mellitus without complication (HCC)    Dysrhythmia    GERD (gastroesophageal reflux disease)    Hyperlipidemia    Hypertension    Hypothyroid    Multilevel degenerative disc disease    Parkinson's disease ([place])    Sleep apnea    Syncope    Tremors of nervous system    Wears dentures    full upper and lower   Past Surgical History:  Procedure Laterality Date   ABDOMINAL HYSTERECTOMY     BACK  SURGERY  09/16   CARDIAC CATHETERIZATION     CATARACT EXTRACTION W/PHACO Right 11/24/2015   Procedure: CATARACT EXTRACTION PHACO AND INTRAOCULAR LENS PLACEMENT ([place]);  Surgeon: Estill Cotta, MD;  Location: [facility] ORS;  Service: Ophthalmology;  Laterality: Right;  [place] 01:14 AP% 25.5 CDE 30.69 fluid pack lot # [id number]   CATARACT EXTRACTION W/PHACO Left 01/06/2021   Procedure: CATARACT EXTRACTION PHACO AND INTRAOCULAR LENS PLACEMENT ([facility]) LEFT DIABETIC 6.98 00:40.1;  Surgeon: Birder Robson, MD;  Location: [place];  Service: Ophthalmology;  Laterality: Left;  Diabetic - oral meds   COLONOSCOPY     COLONOSCOPY WITH PROPOFOL N/A 07/24/2019   Procedure: COLONOSCOPY WITH PROPOFOL;  Surgeon: Toledo, Benay Pike, MD;  Location: [facility] ENDOSCOPY;  Service: Gastroenterology;  Laterality: N/A;   ESOPHAGOGASTRODUODENOSCOPY N/A 07/24/2019   Procedure: ESOPHAGOGASTRODUODENOSCOPY ([facility]);  Surgeon: Toledo, Benay Pike, MD;  Location: [facility] ENDOS[facility];  Service: Gastroenterology;  Laterality: N/A;   OOPHORECTOMY     Family History  Problem Relation Age of Onset   Breast cancer Paternal Aunt    Social History   Tobacco Use   Smoking status: Never   Smokeless tobacco: Never  Substance Use Topics   Alcohol use: Not Currently    Comment: rare   No Known Allergies Prior to Admission medications   Medication Sig Start Date End Date Taking? Authorizing Provider  allopurinol (ZYLOPRIM) 100 MG tablet Take 100 mg by mouth daily.  Yes [provider]  amantadine (SYMMETREL) 100 MG capsule Take 100 mg by mouth 2 (two) times daily.   Yes [provider]  Aspirin-Acetaminophen-Caffeine (GOODY HEADACHE PO) Take by mouth.   Yes [provider]  carbidopa-levodopa (SINEMET CR) 50-200 MG tablet Take 1 tablet by mouth at bedtime.   Yes [provider]  carbidopa-levodopa (SINEMET IR) 25-100 MG tablet Take 2.5 tablets by mouth 4 (four) times daily.   Yes [provider]  Continuous Blood Gluc Sensor (FREESTYLE LIBRE 14 DAY SENSOR) MISC by Does not apply route.   Yes [provider]  Cyanocobalamin (VITAMIN B-12 PO) Take by mouth daily.   Yes [provider]  docusate sodium (COLACE) 100 MG capsule Take 100 mg by mouth 2 (two) times daily.   Yes [provider]  fluticasone (FLONASE) 50 MCG/ACT nasal spray Place 1-2 sprays into the nose as needed. 09/29/15  Yes [provider]  gabapentin (NEURONTIN) 300 MG capsule Take 300 mg by mouth at bedtime.   Yes [provider]  glucose 4 GM chewable tablet Chew 1 tablet by mouth as needed for low blood sugar.   Yes [provider]  levothyroxine (SYNTHROID) 75 MCG tablet Take 75 mcg by mouth daily. 07/20/21 07/20/22 Yes [provider]  losartan (COZAAR) 50 MG tablet Take 1 tablet (50 mg total) by mouth daily. 07/29/21 08/28/21 Yes Sreenath, Sudheer B, MD  metFORMIN (GLUCOPHAGE) 1000 MG tablet Take 1,000 mg by mouth 2 (two) times daily with a meal.   Yes [provider]  metolazone (ZAROXOLYN) 2.5 MG tablet Take 1 tablet (2.5 mg total) by mouth daily. 07/29/21 08/28/21 Yes Sreenath, Sudheer B, MD  metoprolol succinate (TOPROL-XL) 50 MG 24 hr tablet Take 50 mg by mouth daily. Take with or immediately following a meal.   Yes [provider]  Multiple Vitamin (MULTIVITAMIN WITH MINERALS) TABS tablet Take 1 tablet by mouth daily.   Yes [provider]  oxyCODONE-acetaminophen (PERCOCET/ROXICET) 5-325 MG tablet Take 1 tablet by mouth every 6 (six) hours as needed for severe pain.   Yes [provider]  OXYGEN Inhale into the lungs as needed.   Yes [provider]  pramipexole (MIRAPEX) 0.125 MG tablet Take 0.125 mg by mouth 4 (four) times daily as needed.   Yes [provider]  pravastatin (PRAVACHOL) 40 MG tablet Take 40 mg by mouth daily.   Yes [provider]  torsemide (DEMADEX) 20 MG tablet Take 2  tablets (40 mg total) by mouth daily. 07/28/21 08/27/21 Yes Sreenath, Sudheer B, MD  ferrous sulfate 325 (65 FE) MG tablet Take 325 mg by mouth daily with breakfast. Patient not taking: Reported on 08/06/2021    [provider]  glimepiride (AMARYL) 4 MG tablet Take 4 mg by mouth 2 (two) times daily. Pt. Takes 1 tablet in the morning and .5 tablet at night.  12/05/20  [provider]   Review of Systems  Constitutional:  Positive for fatigue. Negative for appetite change.  HENT:  Negative for congestion, postnasal drip and sore throat.   Eyes: Negative.   Respiratory:  Negative for cough, chest tightness and shortness of breath.   Cardiovascular:  Positive for leg swelling. Negative for chest pain and palpitations.  Gastrointestinal:  Positive for abdominal distention. Negative for abdominal pain.  Endocrine: Negative.   Genitourinary: Negative.   Musculoskeletal:  Negative for back pain and neck pain.  Skin: Negative.   Allergic/Immunologic: Negative.   Neurological:  Positive for light-headedness. Negative for dizziness.  Hematological:  Negative for adenopathy. Bruises/bleeds easily (forehead).  Psychiatric/Behavioral:  Negative for dysphoric mood and sleep disturbance (sleeping on 2-3 pillows). The patient is not nervous/anxious.    Vitals:   08/06/21 1449  BP: 130/90  Pulse: 66  Resp: 18  SpO2: 92%  Weight: 158 lb 8 oz (71.9 kg)  Height: 5\' 1"  (1.549 m)   Wt Readings from Last 3 Encounters:  08/06/21 158 lb 8 oz (71.9 kg)  07/28/21 182 lb 12.2 oz (82.9 kg)  01/06/21 187 lb (84.8 kg)   Lab Results  Component Value Date   CREATININE 0.57 07/28/2021   CREATININE 0.47 07/27/2021   CREATININE 0.59 07/26/2021   Physical Exam Vitals and nursing note reviewed. Exam conducted with a chaperone present (daughter).  Constitutional:      Appearance: Normal appearance.  HENT:     Head: Normocephalic and atraumatic.  Cardiovascular:     Rate and Rhythm: Normal rate  and regular rhythm.  Pulmonary:     Effort: Pulmonary effort is normal. No respiratory distress.     Breath sounds: No wheezing or rales.  Abdominal:     General: There is no distension.     Palpations: Abdomen is soft.  Musculoskeletal:        General: No tenderness.     Cervical back: Normal range of motion and neck supple.     Right lower leg: Edema (1+ pitting) present.     Left lower leg: Edema (1+ pitting) present.  Skin:    General: Skin is warm and dry.     Findings: Bruising (on forehead) present.  Neurological:     General: No focal deficit present.     Mental Status: She is alert and oriented to person, place, and time.  Psychiatric:        Mood and Affect: Mood normal.        Behavior: Behavior normal.        Thought Content: Thought content normal.    Assessment & Plan:  1: Chronic heart failure with reduced ejection fraction- - NYHA class II - euvolemic today - weighing daily; advised to call for an overnight weight gain of > 2 pounds or a weekly weight gain of > 5 pounds - not adding salt and her daughter is doing most of the cooking and hasn't been using salt either; low sodium cookbook provided - advised to keep daily fluid intake to between 60-64 ounces/ day - saw cardiology Petra Kuba) 08/05/21; returns in 2 months - on GDMT of metoprolol succinate and losartan - consider changing losartan to entresto, adding spironolactone and/ or SGLT2 - BNP 07/20/21 was 980.6  2: HTN- - BP looks good  although has been low in the past - saw PCP ([place]) 07/30/21; returns 08/19/21 - CMP 07/30/21 reviewed and showed sodium 131, potassium 3.9, creatinine 0.57 and GFR >60  3: DM- - A1c 07/17/21 was 8.3% - glucose at home today was 127  4: Parkinson's- - saw neurology Manuella Ghazi) 05/15/21; returns November 2022 - currently wearing a protective "helmet" in case she falls again   Medication bottles reviewed.   Return in 3 months or sooner for any questions/problems before then.

## 2021-08-06 ENCOUNTER — Ambulatory Visit: Payer: Medicare Other | Attending: Family | Admitting: Family

## 2021-08-06 ENCOUNTER — Telehealth: Payer: Self-pay | Admitting: Family

## 2021-08-06 ENCOUNTER — Encounter: Payer: Self-pay | Admitting: Family

## 2021-08-06 ENCOUNTER — Other Ambulatory Visit: Payer: Self-pay

## 2021-08-06 VITALS — BP 130/90 | HR 66 | Resp 18 | Ht 61.0 in | Wt 158.5 lb

## 2021-08-06 DIAGNOSIS — Z7984 Long term (current) use of oral hypoglycemic drugs: Secondary | ICD-10-CM | POA: Diagnosis not present

## 2021-08-06 DIAGNOSIS — I5022 Chronic systolic (congestive) heart failure: Secondary | ICD-10-CM

## 2021-08-06 DIAGNOSIS — S0083XA Contusion of other part of head, initial encounter: Secondary | ICD-10-CM | POA: Insufficient documentation

## 2021-08-06 DIAGNOSIS — G20 Parkinson's disease: Secondary | ICD-10-CM | POA: Insufficient documentation

## 2021-08-06 DIAGNOSIS — Z9981 Dependence on supplemental oxygen: Secondary | ICD-10-CM | POA: Insufficient documentation

## 2021-08-06 DIAGNOSIS — E119 Type 2 diabetes mellitus without complications: Secondary | ICD-10-CM | POA: Diagnosis not present

## 2021-08-06 DIAGNOSIS — Z79899 Other long term (current) drug therapy: Secondary | ICD-10-CM | POA: Insufficient documentation

## 2021-08-06 DIAGNOSIS — I110 Hypertensive heart disease with heart failure: Secondary | ICD-10-CM | POA: Diagnosis not present

## 2021-08-06 DIAGNOSIS — G4730 Sleep apnea, unspecified: Secondary | ICD-10-CM | POA: Insufficient documentation

## 2021-08-06 DIAGNOSIS — I10 Essential (primary) hypertension: Secondary | ICD-10-CM

## 2021-08-06 DIAGNOSIS — K219 Gastro-esophageal reflux disease without esophagitis: Secondary | ICD-10-CM | POA: Diagnosis not present

## 2021-08-06 DIAGNOSIS — X58XXXA Exposure to other specified factors, initial encounter: Secondary | ICD-10-CM | POA: Diagnosis not present

## 2021-08-06 DIAGNOSIS — E785 Hyperlipidemia, unspecified: Secondary | ICD-10-CM | POA: Diagnosis not present

## 2021-08-06 NOTE — Patient Instructions (Addendum)
Continue weighing daily and call for an overnight weight gain of > 2 pounds or a weekly weight gain of >5 pounds.    Drink around 60-64 ounces of fluid daily.

## 2021-08-06 NOTE — Telephone Encounter (Signed)
Called patient, her two daughters and son in regards to patients chf clinic appointment with Darylene Price in attempt for patient to confirm todays appointment but was unable to reach or lvm for any of them.   Ahmari Duerson, NT

## 2021-08-07 ENCOUNTER — Encounter: Payer: Self-pay | Admitting: Family

## 2021-08-31 ENCOUNTER — Emergency Department (HOSPITAL_COMMUNITY): Payer: Medicare Other

## 2021-08-31 ENCOUNTER — Inpatient Hospital Stay (HOSPITAL_COMMUNITY)
Admission: EM | Admit: 2021-08-31 | Discharge: 2021-09-07 | DRG: 291 | Disposition: A | Discharge status: Skilled Nursing Facility | Payer: Medicare Other | Attending: Internal Medicine | Admitting: Internal Medicine

## 2021-08-31 ENCOUNTER — Encounter (HOSPITAL_COMMUNITY): Payer: Self-pay

## 2021-08-31 DIAGNOSIS — Z9842 Cataract extraction status, left eye: Secondary | ICD-10-CM

## 2021-08-31 DIAGNOSIS — J181 Lobar pneumonia, unspecified organism: Secondary | ICD-10-CM | POA: Diagnosis present

## 2021-08-31 DIAGNOSIS — I5023 Acute on chronic systolic (congestive) heart failure: Principal | ICD-10-CM | POA: Diagnosis present

## 2021-08-31 DIAGNOSIS — R1310 Dysphagia, unspecified: Secondary | ICD-10-CM | POA: Diagnosis present

## 2021-08-31 DIAGNOSIS — E1142 Type 2 diabetes mellitus with diabetic polyneuropathy: Secondary | ICD-10-CM

## 2021-08-31 DIAGNOSIS — R531 Weakness: Principal | ICD-10-CM

## 2021-08-31 DIAGNOSIS — G20A1 Parkinson's disease without dyskinesia, without mention of fluctuations: Secondary | ICD-10-CM | POA: Diagnosis present

## 2021-08-31 DIAGNOSIS — I152 Hypertension secondary to endocrine disorders: Secondary | ICD-10-CM | POA: Diagnosis present

## 2021-08-31 DIAGNOSIS — I878 Other specified disorders of veins: Secondary | ICD-10-CM | POA: Diagnosis present

## 2021-08-31 DIAGNOSIS — E119 Type 2 diabetes mellitus without complications: Secondary | ICD-10-CM

## 2021-08-31 DIAGNOSIS — E1159 Type 2 diabetes mellitus with other circulatory complications: Secondary | ICD-10-CM | POA: Diagnosis present

## 2021-08-31 DIAGNOSIS — H9190 Unspecified hearing loss, unspecified ear: Secondary | ICD-10-CM | POA: Diagnosis present

## 2021-08-31 DIAGNOSIS — Z793 Long term (current) use of hormonal contraceptives: Secondary | ICD-10-CM

## 2021-08-31 DIAGNOSIS — I447 Left bundle-branch block, unspecified: Secondary | ICD-10-CM | POA: Diagnosis present

## 2021-08-31 DIAGNOSIS — F419 Anxiety disorder, unspecified: Secondary | ICD-10-CM | POA: Diagnosis present

## 2021-08-31 DIAGNOSIS — R54 Age-related physical debility: Secondary | ICD-10-CM | POA: Diagnosis present

## 2021-08-31 DIAGNOSIS — G9341 Metabolic encephalopathy: Secondary | ICD-10-CM | POA: Diagnosis present

## 2021-08-31 DIAGNOSIS — G4730 Sleep apnea, unspecified: Secondary | ICD-10-CM | POA: Diagnosis present

## 2021-08-31 DIAGNOSIS — F4310 Post-traumatic stress disorder, unspecified: Secondary | ICD-10-CM | POA: Diagnosis present

## 2021-08-31 DIAGNOSIS — M79641 Pain in right hand: Secondary | ICD-10-CM | POA: Diagnosis present

## 2021-08-31 DIAGNOSIS — K219 Gastro-esophageal reflux disease without esophagitis: Secondary | ICD-10-CM | POA: Diagnosis present

## 2021-08-31 DIAGNOSIS — I509 Heart failure, unspecified: Secondary | ICD-10-CM

## 2021-08-31 DIAGNOSIS — Z20822 Contact with and (suspected) exposure to covid-19: Secondary | ICD-10-CM | POA: Diagnosis present

## 2021-08-31 DIAGNOSIS — Z90710 Acquired absence of both cervix and uterus: Secondary | ICD-10-CM

## 2021-08-31 DIAGNOSIS — R21 Rash and other nonspecific skin eruption: Secondary | ICD-10-CM | POA: Diagnosis present

## 2021-08-31 DIAGNOSIS — Z803 Family history of malignant neoplasm of breast: Secondary | ICD-10-CM

## 2021-08-31 DIAGNOSIS — E039 Hypothyroidism, unspecified: Secondary | ICD-10-CM | POA: Diagnosis present

## 2021-08-31 DIAGNOSIS — Z7984 Long term (current) use of oral hypoglycemic drugs: Secondary | ICD-10-CM

## 2021-08-31 DIAGNOSIS — G20 Parkinson's disease: Secondary | ICD-10-CM | POA: Diagnosis present

## 2021-08-31 DIAGNOSIS — Z9981 Dependence on supplemental oxygen: Secondary | ICD-10-CM

## 2021-08-31 DIAGNOSIS — L03116 Cellulitis of left lower limb: Secondary | ICD-10-CM

## 2021-08-31 DIAGNOSIS — J189 Pneumonia, unspecified organism: Secondary | ICD-10-CM | POA: Diagnosis present

## 2021-08-31 DIAGNOSIS — E1169 Type 2 diabetes mellitus with other specified complication: Secondary | ICD-10-CM | POA: Diagnosis present

## 2021-08-31 DIAGNOSIS — G2581 Restless legs syndrome: Secondary | ICD-10-CM | POA: Diagnosis present

## 2021-08-31 DIAGNOSIS — Z961 Presence of intraocular lens: Secondary | ICD-10-CM | POA: Diagnosis present

## 2021-08-31 DIAGNOSIS — E785 Hyperlipidemia, unspecified: Secondary | ICD-10-CM | POA: Diagnosis present

## 2021-08-31 DIAGNOSIS — Z79899 Other long term (current) drug therapy: Secondary | ICD-10-CM

## 2021-08-31 DIAGNOSIS — Z7982 Long term (current) use of aspirin: Secondary | ICD-10-CM

## 2021-08-31 DIAGNOSIS — Z9841 Cataract extraction status, right eye: Secondary | ICD-10-CM

## 2021-08-31 LAB — CBC WITH DIFFERENTIAL/PLATELET
Abs Immature Granulocytes: 0.01 10*3/uL (ref 0.00–0.07)
Basophils Absolute: 0.1 10*3/uL (ref 0.0–0.1)
Basophils Relative: 1 %
Eosinophils Absolute: 0.2 10*3/uL (ref 0.0–0.5)
Eosinophils Relative: 3 %
HCT: 38.2 % (ref 36.0–46.0)
Hemoglobin: 11.9 g/dL — ABNORMAL LOW (ref 12.0–15.0)
Immature Granulocytes: 0 %
Lymphocytes Relative: 15 %
Lymphs Abs: 1 10*3/uL (ref 0.7–4.0)
MCH: 26.4 pg (ref 26.0–34.0)
MCHC: 31.2 g/dL (ref 30.0–36.0)
MCV: 84.9 fL (ref 80.0–100.0)
Monocytes Absolute: 0.6 10*3/uL (ref 0.1–1.0)
Monocytes Relative: 9 %
Neutro Abs: 4.6 10*3/uL (ref 1.7–7.7)
Neutrophils Relative %: 72 %
Platelets: 175 10*3/uL (ref 150–400)
RBC: 4.5 MIL/uL (ref 3.87–5.11)
RDW: 21 % — ABNORMAL HIGH (ref 11.5–15.5)
WBC: 6.3 10*3/uL (ref 4.0–10.5)
nRBC: 0 % (ref 0.0–0.2)

## 2021-08-31 LAB — COMPREHENSIVE METABOLIC PANEL
ALT: 14 U/L (ref 0–44)
AST: 15 U/L (ref 15–41)
Albumin: 3.4 g/dL — ABNORMAL LOW (ref 3.5–5.0)
Alkaline Phosphatase: 97 U/L (ref 38–126)
Anion gap: 10 (ref 5–15)
BUN: 28 mg/dL — ABNORMAL HIGH (ref 8–23)
CO2: 23 mmol/L (ref 22–32)
Calcium: 8.9 mg/dL (ref 8.9–10.3)
Chloride: 105 mmol/L (ref 98–111)
Creatinine, Ser: 0.78 mg/dL (ref 0.44–1.00)
GFR, Estimated: >60 mL/min (ref >60)
Glucose, Bld: 167 mg/dL — ABNORMAL HIGH (ref 70–99)
Potassium: 3.9 mmol/L (ref 3.5–5.1)
Sodium: 138 mmol/L (ref 135–145)
Total Bilirubin: 0.8 mg/dL (ref 0.3–1.2)
Total Protein: 7.4 g/dL (ref 6.5–8.1)

## 2021-08-31 LAB — RESP PANEL BY RT-PCR (FLU A&B, COVID) ARPGX2
Influenza A by PCR: NEGATIVE
Influenza B by PCR: NEGATIVE
SARS Coronavirus 2 by RT PCR: NEGATIVE

## 2021-08-31 LAB — BRAIN NATRIURETIC PEPTIDE: B Natriuretic Peptide: 991.1 pg/mL — ABNORMAL HIGH (ref 0.0–100.0)

## 2021-08-31 LAB — CBG MONITORING, ED: Glucose-Capillary: 178 mg/dL — ABNORMAL HIGH (ref 70–99)

## 2021-08-31 LAB — TROPONIN I (HIGH SENSITIVITY): Troponin I (High Sensitivity): 13 ng/L (ref <18)

## 2021-08-31 MED ORDER — ACETAMINOPHEN 325 MG PO TABS
650.0000 mg | ORAL_TABLET | Freq: Once | ORAL | Status: AC
  Administered 2021-08-31: 650 mg via ORAL
  Filled 2021-08-31: qty 2

## 2021-08-31 MED ORDER — SODIUM CHLORIDE 0.9 % IV SOLN
500.0000 mg | Freq: Once | INTRAVENOUS | Status: DC
  Filled 2021-08-31: qty 500

## 2021-08-31 MED ORDER — SODIUM CHLORIDE 0.9 % IV SOLN
1.0000 g | Freq: Once | INTRAVENOUS | Status: DC
Start: 2021-08-31 — End: 2021-09-01
  Filled 2021-08-31: qty 10

## 2021-08-31 MED ORDER — FUROSEMIDE 10 MG/ML IJ SOLN: 40.0000 mg | Freq: Once | INTRAMUSCULAR | Status: DC

## 2021-08-31 NOTE — ED Triage Notes (Signed)
Patient arrives from home with complaint of generalized weakness x 2 days, headache x 1 wk, with difficulty urinating and foul smelling urine. Pt reports she has been having difficulty ambulating and is barely able to stand. EMS reports swelling to R eye ongoing x 2 weeks. A&O x4  EMS vitals BP 134/80 HR 84 SPO2 96% 2 L [place] (baseline) CBG 239 Hx DM EKG L [place] branch block, hx of HF

## 2021-08-31 NOTE — H&P (Signed)
History and Physical    Stephanie [name] JYN:[id number] DOB: 09/01/1946 DOA: 08/31/2021  PCP: [name], MD  Patient coming from: Home via EMS  I have personally briefly reviewed patient's old medical records in [place]  Chief Complaint: Generalized weakness  HPI: Stephanie [name] is a 75 y.o. female with medical history significant for HFrEF (EF 25-30% by TTE 07/21/2021) on 2 L O2 via [place] at night, type 2 diabetes, HTN, HLD, hypothyroidism, LBBB, Parkinson's disease who presented to the ED for evaluation of generalized weakness.  Patient last admitted 07/20/2021-07/28/2021 for acute decompensated CHF.  Echocardiogram 9/20 revealed worsening EF of 25-30%.  Patient was diuresed with discharge weight 182 pounds.  She was incidentally noted to have possible esophageal foreign body on CT angio.  She was evaluated by SLP and considered high risk for aspiration and not a good candidate for barium esophagram.  She was stable and tolerating p.o. intake without issue.  Patient states that over the last 2 days she has been feeling generally weak and stiff throughout her body.  She says she normally ambulates with the use of a rolling walker but has not been able to the last 2 days.  She has been nauseous but has not thrown up.  She does report difficulty swallowing foods, liquids, and pills.  She feels that she is gaining fluid with some increased swelling in both of her legs.  She has erythema to both of her lower legs which she says is slowly improving after being diagnosed with and treated for cellulitis about a month ago.  She has been having dyspnea at rest but no cough.  She denies any chest pain, dysuria, diarrhea.  ED Course:  Initial vitals show BP 151/106, pulse 89, RR 13, temp 97.8 F, SPO2 100% on room air.  Labs show WBC 6.3, hemoglobin 11.9, platelets 175,000, sodium 138, potassium 3.9, bicarb 23, BUN 28, creatinine 0.78, serum glucose 167, LFTs within normal limits,  high-sensitivity troponin 13, BNP 991.1.  SARS-CoV-2 and influenza PCR's negative.  Urinalysis and urine culture collected and pending.  2 view chest x-ray shows developing patchy airspace opacity in the right upper lobe and lingula linear atelectasis versus scarring.  CT head without contrast is negative for evidence of acute intracranial abnormality.  Patient was given IV Lasix 40 mg, IV azithromycin and ceftriaxone.  The hospitalist service was consulted to admit for further evaluation and management.  Review of Systems: All systems reviewed and are negative except as documented in history of present illness above.   Past Medical History:  Diagnosis Date   Anemia    Arthritis    CHF (congestive heart failure) (HCC)    Chronic back pain    Diabetes mellitus without complication (HCC)    Dysrhythmia    GERD (gastroesophageal reflux disease)    Hyperlipidemia    Hypertension    Hypothyroid    Multilevel degenerative disc disease    Parkinson's disease ([place])    Sleep apnea    Syncope    Tremors of nervous system    Wears dentures    full upper and lower    Past Surgical History:  Procedure Laterality Date   ABDOMINAL HYSTERECTOMY     BACK SURGERY  09/16   CARDIAC CATHETERIZATION     CATARACT EXTRACTION W/PHACO Right 11/24/2015   Procedure: CATARACT EXTRACTION PHACO AND INTRAOCULAR LENS PLACEMENT ([place]);  Surgeon: Estill Cotta, MD;  Location: [facility] ORS;  Service: Ophthalmology;  Laterality: Right;  [place]  01:14 AP% 25.5 CDE 30.69 fluid pack lot # [id number]   CATARACT EXTRACTION W/PHACO Left 01/06/2021   Procedure: CATARACT EXTRACTION PHACO AND INTRAOCULAR LENS PLACEMENT ([facility]) LEFT DIABETIC 6.98 00:40.1;  Surgeon: Birder Robson, MD;  Location: [facility];  Service: Ophthalmology;  Laterality: Left;  Diabetic - oral meds   COLONOSCOPY     COLONOSCOPY WITH PROPOFOL N/A 07/24/2019   Procedure: COLONOSCOPY WITH PROPOFOL;  Surgeon: Toledo, Benay Pike, MD;  Location: [facility]  ENDOSCOPY;  Service: Gastroenterology;  Laterality: N/A;   ESOPHAGOGASTRODUODENOSCOPY N/A 07/24/2019   Procedure: ESOPHAGOGASTRODUODENOSCOPY (EGD);  Surgeon: Toledo, Benay Pike, MD;  Location: [facility] ENDOSCOPY;  Service: Gastroenterology;  Laterality: N/A;   OOPHORECTOMY      Social History:  reports that she has never smoked. She has never used smokeless tobacco. She reports that she does not currently use alcohol. She reports that she does not use drugs.  No Known Allergies  Family History  Problem Relation Age of Onset   Breast cancer Paternal Aunt      Prior to Admission medications   Medication Sig Start Date End Date Taking? Authorizing Provider  allopurinol (ZYLOPRIM) 100 MG tablet Take 100 mg by mouth daily.    [provider]  amantadine (SYMMETREL) 100 MG capsule Take 100 mg by mouth 2 (two) times daily.    [provider]  Aspirin-Acetaminophen-Caffeine (GOODY HEADACHE PO) Take by mouth.    [provider]  carbidopa-levodopa (SINEMET CR) 50-200 MG tablet Take 1 tablet by mouth at bedtime.    [provider]  carbidopa-levodopa (SINEMET IR) 25-100 MG tablet Take 2.5 tablets by mouth 4 (four) times daily.    [provider]  Continuous Blood Gluc Sensor (FREESTYLE LIBRE 14 DAY SENSOR) MISC by Does not apply route.    [provider]  Cyanocobalamin (VITAMIN B-12 PO) Take by mouth daily.    [provider]  docusate sodium (COLACE) 100 MG capsule Take 100 mg by mouth 2 (two) times daily.    [provider]  ferrous sulfate 325 (65 FE) MG tablet Take 325 mg by mouth daily with breakfast. Patient not taking: Reported on 08/06/2021    [provider]  fluticasone (FLONASE) 50 MCG/ACT nasal spray Place 1-2 sprays into the nose as needed. 09/29/15   [provider]  gabapentin (NEURONTIN) 300 MG capsule Take 300 mg by mouth at bedtime.    [provider]  glucose 4 GM chewable tablet Chew  1 tablet by mouth as needed for low blood sugar.    [provider]  levothyroxine (SYNTHROID) 75 MCG tablet Take 75 mcg by mouth daily. 07/20/21 07/20/22  [provider]  losartan (COZAAR) 50 MG tablet Take 1 tablet (50 mg total) by mouth daily. 07/29/21 08/28/21  Sidney Ace, MD  metFORMIN (GLUCOPHAGE) 1000 MG tablet Take 1,000 mg by mouth 2 (two) times daily with a meal.    [provider]  metolazone (ZAROXOLYN) 2.5 MG tablet Take 1 tablet (2.5 mg total) by mouth daily. 07/29/21 08/28/21  Sidney Ace, MD  metoprolol succinate (TOPROL-XL) 50 MG 24 hr tablet Take 50 mg by mouth daily. Take with or immediately following a meal.    [provider]  Multiple Vitamin (MULTIVITAMIN WITH MINERALS) TABS tablet Take 1 tablet by mouth daily.    [provider]  oxyCODONE-acetaminophen (PERCOCET/ROXICET) 5-325 MG tablet Take 1 tablet by mouth every 6 (six) hours as needed for severe pain.    [provider]  OXYGEN Inhale into the lungs as needed.    [provider]  pramipexole (MIRAPEX) 0.125 MG tablet Take 0.125 mg by mouth 4 (four) times daily as needed.    [provider]  pravastatin (PRAVACHOL) 40 MG tablet Take 40 mg by mouth daily.    [provider]  torsemide (DEMADEX) 20 MG tablet Take 2 tablets (40 mg total) by mouth daily. 07/28/21 08/27/21  Sidney Ace, MD  glimepiride (AMARYL) 4 MG tablet Take 4 mg by mouth 2 (two) times daily. Pt. Takes 1 tablet in the morning and .5 tablet at night.  12/05/20  [provider]    Physical Exam: Vitals:   08/31/21 2120 08/31/21 2200 08/31/21 2300  BP: (!) 151/106 (!) 180/105 138/79  Pulse: 84 79 72  Resp: 13 16 20   Temp: 97.8 F (36.6 C)    TempSrc: Oral    SpO2: 100% 96% 100%   Constitutional: Frail elderly woman resting in bed in the left lateral decubitus position, NAD, calm, comfortable Eyes: PERRL, lids and conjunctivae normal ENMT:  Mucous membranes are moist. Posterior pharynx clear of any exudate or lesions. Neck: normal, supple, no masses. Respiratory: clear to auscultation bilaterally, no wheezing, no crackles. Normal respiratory effort while on 2 L O2 via [place]. No accessory muscle use.  Cardiovascular: Regular rate and rhythm, no murmurs / rubs / gallops.  +1 bilateral lower extremity edema. 2+ pedal pulses. Abdomen: no tenderness, no masses palpated. Bowel sounds positive.  Musculoskeletal: no clubbing / cyanosis. No joint deformity upper and lower extremities. Good ROM, no contractures.  Somewhat rigid muscle tone. Skin: Erythema to bilateral lower extremities with increased warmth to touch, left greater than right. Neurologic: CN 2-12 grossly intact. Sensation intact.  Generally weak with otherwise intact and equal strength throughout. Psychiatric: Normal judgment and insight. Alert and oriented x 3. Normal mood.   Labs on Admission: I have personally reviewed following labs and imaging studies  CBC: Recent Labs  Lab 08/31/21 2223  WBC 6.3  NEUTROABS 4.6  HGB 11.9*  HCT 38.2  MCV 84.9  PLT 809   Basic Metabolic Panel: Recent Labs  Lab 08/31/21 2223  NA 138  K 3.9  CL 105  CO2 23  GLUCOSE 167*  BUN 28*  CREATININE 0.78  CALCIUM 8.9   GFR: CrCl cannot be calculated (Unknown ideal weight.). Liver Function Tests: Recent Labs  Lab 08/31/21 2223  AST 15  ALT 14  ALKPHOS 97  BILITOT 0.8  PROT 7.4  ALBUMIN 3.4*   No results for input(s): LIPASE, AMYLASE in the last 168 hours. No results for input(s): AMMONIA in the last 168 hours. Coagulation Profile: No results for input(s): INR, PROTIME in the last 168 hours. Cardiac Enzymes: No results for input(s): CKTOTAL, CKMB, CKMBINDEX, TROPONINI in the last 168 hours. BNP (last 3 results) No results for input(s): PROBNP in the last 8760 hours. HbA1C: No results for input(s): HGBA1C in the last 72 hours. CBG: Recent Labs  Lab 08/31/21 2236   GLUCAP 178*   Lipid Profile: No results for input(s): CHOL, HDL, LDLCALC, TRIG, CHOLHDL, LDLDIRECT in the last 72 hours. Thyroid Function Tests: No results for input(s): TSH, T4TOTAL, FREET4, T3FREE, THYROIDAB in the last 72 hours. Anemia Panel: No results for input(s): VITAMINB12, FOLATE, FERRITIN, TIBC, IRON, RETICCTPCT in the last 72 hours. Urine analysis:    Component Value Date/Time   COLORURINE YELLOW (A) 07/24/2021 0955   APPEARANCEUR HAZY (A) 07/24/2021 0955   LABSPEC 1.014 07/24/2021 9833  PHURINE 6.0 07/24/2021 [place] 07/24/2021 [place] 07/24/2021 [place] 07/24/2021 0955   KETONESUR 5 (A) 07/24/2021 0955   PROTEINUR NEGATIVE 07/24/2021 0955   NITRITE NEGATIVE 07/24/2021 0955   LEUKOCYTESUR SMALL (A) 07/24/2021 0955    Radiological Exams on Admission: DG Chest 2 View  Result Date: 08/31/2021 CLINICAL DATA:  chest pain EXAM: CHEST - 2 VIEW COMPARISON:  Chest x-ray 07/24/2021, CT chest 07/22/2021 FINDINGS: The heart and mediastinal contours are unchanged. Lingular linear atelectasis versus scarring. Question developing patchy airspace opacity within the right upper lobe. Similar appearing coarsened interstitial markings with no overt pulmonary edema. No pleural effusion. No pneumothorax. No acute osseous abnormality. IMPRESSION: Question developing patchy airspace opacity within the right upper lobe. Electronically Signed   By: Iven Finn M.D.   On: 08/31/2021 21:44   CT HEAD WO CONTRAST (5MM)  Result Date: 08/31/2021 CLINICAL DATA:  Headache EXAM: CT HEAD WITHOUT CONTRAST TECHNIQUE: Contiguous axial images were obtained from the base of the skull through the vertex without intravenous contrast. COMPARISON:  07/06/2021 FINDINGS: Brain: No evidence of acute infarction, hemorrhage, hydrocephalus, extra-axial collection or mass lesion/mass effect. Subcortical white matter and periventricular small vessel ischemic  changes. Vascular: Intracranial atherosclerosis. Skull: Normal. Negative for fracture or focal lesion. Sinuses/Orbits: The visualized paranasal sinuses are essentially clear. The mastoid air cells are unopacified. Other: None. IMPRESSION: No evidence of acute intracranial abnormality. Small vessel ischemic changes. Electronically Signed   By: Julian Hy M.D.   On: 08/31/2021 22:02    EKG: Personally reviewed. Sinus rhythm, LBBB, PVC present.  Similar to prior.  Assessment/Plan Principal Problem:   Acute on chronic HFrEF (heart failure with reduced ejection fraction) (HCC) Active Problems:   Type 2 diabetes mellitus ([place])   Hypothyroid   Parkinson's disease ([place])   Hyperlipidemia associated with type 2 diabetes mellitus ([place])   Hypertension associated with diabetes ([place])   Stephanie [name] is a 75 y.o. female with medical history significant for HFrEF (EF 25-30% by TTE 07/21/2021) on 2 L O2 via [place] at night, type 2 diabetes, HTN, HLD, hypothyroidism, LBBB, Parkinson's disease who is admitted with acute on chronic HFrEF, generalized weakness.  Acute on chronic HFrEF: Last EF 25-30% by TTE 07/21/2021.  Presenting with increased bilateral lower extremity edema, dyspnea, BNP 991.1.  Home regimen includes torsemide 40 mg daily.  Previously on metolazone 2.5 mg daily which was discontinued about 12 days ago prior to admission. -Start on IV Lasix 40 mg twice daily -Monitor strict I/O's, daily weights, renal function -Continue losartan 50 mg daily, Toprol-XL 50 mg daily  Right upper lobe opacity: Chest x-ray shows possible developing right upper lobe patchy airspace opacity which could reflect early developing pneumonia.  Patient is considered high risk for aspiration as well.  She was started on empiric antibiotics.  COVID and influenza negative.  We will continue for now with IV ceftriaxone and azithromycin.  Follow strep pneumo and Legionella urinary antigens, procalcitonin, sputum  culture.  Bilateral lower extremity erythema: Ongoing for the last month per patient, likely venous stasis changes plus possible superimposed cellulitis.  Had been treated with oral Keflex as an outpatient.  She is on IV ceftriaxone as above, continue to monitor for improvement.  Generalized weakness/deconditioning: Normally ambulates with use of rolling walker but unable to do so the last 2 days.  She reports stiffness throughout her extremities as well.  Likely multifactorial in setting of lower extremity edema from CHF,  deconditioning, and Parkinson's disease.  Also recently started on Seroquel which can rarely cause muscle rigidity. -Hold Seroquel and pramipexole for now -Consult PT/OT, placed on fall precautions -Check CK  Dysphagia: Reports difficulty swallowing foods, liquids, pills.  She was considered high risk for aspiration by SLP on last admission.  Will continue on dysphagia 3 diet, follow-up SLP eval.  Type 2 diabetes: Hold metformin and Actos.  Place on SSI.  Hypertension: Continue losartan, Toprol-XL.  Hypothyroidism: Continue Synthroid.  Hyperlipidemia: Hold pravastatin pending CK.  Parkinson's disease: Continue home Sinemet.  Restless leg syndrome: Holding pramipexole for now given weakness/fatigue.  DVT prophylaxis: Subcutaneous heparin Code Status: Full code, confirmed with patient on admission Family Communication: Discussed with patient, she has discussed with family Disposition Plan: From home, dispo pending clinical progress Consults called: None Level of care: Telemetry Admission status:  Status is: Observation  The patient remains OBS appropriate and will d/c before 2 midnights.  Zada Finders MD [facility]  If 7PM-7AM, please contact night-coverage [email or web address]  08/31/2021, 11:57 PM

## 2021-08-31 NOTE — ED Notes (Signed)
Patient transported to X-ray 

## 2021-08-31 NOTE — ED Provider Notes (Signed)
[facility] DEPT Provider Note   CSN: [id number] Arrival date & time: 08/31/21  2114     History Chief Complaint  Patient presents with   Weakness    Stephanie [name] is a 75 y.o. female with a past medical history of hypertension, anemia, CHF, diabetes presenting to the ED with generalized weakness and myalgias.  Symptoms began 2 days ago.  Reports pain in her back, intermittent chest pain as well as a headache.  Reports worsening leg swelling than usual.  She has tried ibuprofen with only minimal improvement in her symptoms.  No sick contacts or similar symptoms.  Denies any abdominal pain, vomiting or diarrhea.  Reports a slight cough.  She resides at home with her daughter.  She is concerned about a UTI due to foul-smelling urine. Reports some flank pain. Pain in both extremities.   Weakness Associated symptoms: chest pain, dysuria, headaches and myalgias   Associated symptoms: no abdominal pain, no cough, no diarrhea, no dizziness, no fever, no nausea, no shortness of breath, no urgency and no vomiting       Past Medical History:  Diagnosis Date   Anemia    Arthritis    CHF (congestive heart failure) (HCC)    Chronic back pain    Diabetes mellitus without complication (HCC)    Dysrhythmia    GERD (gastroesophageal reflux disease)    Hyperlipidemia    Hypertension    Hypothyroid    Multilevel degenerative disc disease    Parkinson's disease ([place])    Sleep apnea    Syncope    Tremors of nervous system    Wears dentures    full upper and lower    Patient Active Problem List   Diagnosis Date Noted   Acute on chronic HFrEF (heart failure with reduced ejection fraction) ([place]) 08/31/2021   Acute on chronic systolic (congestive) heart failure ([place]) 07/20/2021   SOB (shortness of breath) 07/20/2021   Acute respiratory failure with hypoxia ([place]) 07/20/2021   Diabetes mellitus without complication ([place])    Hypothyroid    Parkinson's  disease ([place])    Hyperlipidemia    Gout, unspecified 11/01/2020   Left bundle branch block 05/09/2019   Hyponatremia 12/31/2015    Past Surgical History:  Procedure Laterality Date   ABDOMINAL HYSTERECTOMY     BACK SURGERY  09/16   CARDIAC CATHETERIZATION     CATARACT EXTRACTION W/PHACO Right 11/24/2015   Procedure: CATARACT EXTRACTION PHACO AND INTRAOCULAR LENS PLACEMENT ([place]);  Surgeon: Estill Cotta, MD;  Location: [facility];  Service: Ophthalmology;  Laterality: Right;  [place] 01:14 AP% 25.5 CDE 30.69 fluid pack lot # [id number]   CATARACT EXTRACTION W/PHACO Left 01/06/2021   Procedure: CATARACT EXTRACTION PHACO AND INTRAOCULAR LENS PLACEMENT (IOC) LEFT DIABETIC 6.98 00:40.1;  Surgeon: Birder Robson, MD;  Location: [facility];  Service: Ophthalmology;  Laterality: Left;  Diabetic - oral meds   COLONOSCOPY     COLONOSCOPY WITH PROPOFOL N/A 07/24/2019   Procedure: COLONOSCOPY WITH PROPOFOL;  Surgeon: Toledo, Benay Pike, MD;  Location: [facility];  Service: Gastroenterology;  Laterality: N/A;   ESOPHAGOGASTRODUODENOSCOPY N/A 07/24/2019   Procedure: ESOPHAGOGASTRODUODENOSCOPY (EGD);  Surgeon: Toledo, Benay Pike, MD;  Location: [facility];  Service: Gastroenterology;  Laterality: N/A;   OOPHORECTOMY       OB History     Gravida  4   Para      Term      Preterm      AB  Living  4      SAB      IAB      Ectopic      Multiple      Live Births              Family History  Problem Relation Age of Onset   Breast cancer Paternal Aunt     Social History   Tobacco Use   Smoking status: Never   Smokeless tobacco: Never  Vaping Use   Vaping Use: Never used  Substance Use Topics   Alcohol use: Not Currently    Comment: rare   Drug use: No    Home Medications Prior to Admission medications   Medication Sig Start Date End Date Taking? Authorizing Provider  allopurinol (ZYLOPRIM) 100 MG tablet Take 100 mg by mouth daily.    [provider]  amantadine (SYMMETREL) 100 MG capsule Take 100 mg by mouth 2 (two) times daily.    [provider]  Aspirin-Acetaminophen-Caffeine (GOODY HEADACHE PO) Take by mouth.    [provider]  carbidopa-levodopa (SINEMET CR) 50-200 MG tablet Take 1 tablet by mouth at bedtime.    [provider]  carbidopa-levodopa (SINEMET IR) 25-100 MG tablet Take 2.5 tablets by mouth 4 (four) times daily.    [provider]  Continuous Blood Gluc Sensor (FREESTYLE LIBRE 14 DAY SENSOR) MISC by Does not apply route.    [provider]  Cyanocobalamin (VITAMIN B-12 PO) Take by mouth daily.    [provider]  docusate sodium (COLACE) 100 MG capsule Take 100 mg by mouth 2 (two) times daily.    [provider]  ferrous sulfate 325 (65 FE) MG tablet Take 325 mg by mouth daily with breakfast. Patient not taking: Reported on 08/06/2021    [provider]  fluticasone (FLONASE) 50 MCG/ACT nasal spray Place 1-2 sprays into the nose as needed. 09/29/15   [provider]  gabapentin (NEURONTIN) 300 MG capsule Take 300 mg by mouth at bedtime.    [provider]  glucose 4 GM chewable tablet Chew 1 tablet by mouth as needed for low blood sugar.    [provider]  levothyroxine (SYNTHROID) 75 MCG tablet Take 75 mcg by mouth daily. 07/20/21 07/20/22  [provider]  losartan (COZAAR) 50 MG tablet Take 1 tablet (50 mg total) by mouth daily. 07/29/21 08/28/21  Sidney Ace, MD  metFORMIN (GLUCOPHAGE) 1000 MG tablet Take 1,000 mg by mouth 2 (two) times daily with a meal.    [provider]  metolazone (ZAROXOLYN) 2.5 MG tablet Take 1 tablet (2.5 mg total) by mouth daily. 07/29/21 08/28/21  Sidney Ace, MD  metoprolol succinate (TOPROL-XL) 50 MG 24 hr tablet Take 50 mg by mouth daily. Take with or immediately following a meal.    [provider]  Multiple Vitamin (MULTIVITAMIN WITH  MINERALS) TABS tablet Take 1 tablet by mouth daily.    [provider]  oxyCODONE-acetaminophen (PERCOCET/ROXICET) 5-325 MG tablet Take 1 tablet by mouth every 6 (six) hours as needed for severe pain.    [provider]  OXYGEN Inhale into the lungs as needed.    [provider]  pramipexole (MIRAPEX) 0.125 MG tablet Take 0.125 mg by mouth 4 (four) times daily as needed.    [provider]  pravastatin (PRAVACHOL) 40 MG tablet Take 40 mg by mouth daily.    [provider]  torsemide (DEMADEX) 20 MG tablet Take 2  tablets (40 mg total) by mouth daily. 07/28/21 08/27/21  Sidney Ace, MD  glimepiride (AMARYL) 4 MG tablet Take 4 mg by mouth 2 (two) times daily. Pt. Takes 1 tablet in the morning and .5 tablet at night.  12/05/20  [provider]    Allergies    Patient has no known allergies.  Review of Systems   Review of Systems  Constitutional:  Positive for chills. Negative for appetite change and fever.  HENT:  Negative for ear pain, rhinorrhea, sneezing and sore throat.   Eyes:  Negative for photophobia and visual disturbance.  Respiratory:  Negative for cough, chest tightness, shortness of breath and wheezing.   Cardiovascular:  Positive for chest pain. Negative for palpitations.  Gastrointestinal:  Negative for abdominal pain, blood in stool, constipation, diarrhea, nausea and vomiting.  Genitourinary:  Positive for dysuria. Negative for hematuria and urgency.  Musculoskeletal:  Positive for myalgias.  Skin:  Negative for rash.  Neurological:  Positive for weakness and headaches. Negative for dizziness and light-headedness.   Physical Exam Updated Vital Signs BP 138/79   Pulse 72   Temp 97.8 F (36.6 C) (Oral)   Resp 20   SpO2 100%   Physical Exam Vitals and nursing note reviewed.  Constitutional:      General: She is not in acute distress.    Appearance: She is well-developed.  HENT:     Head: Normocephalic and  atraumatic.     Nose: Nose normal.  Eyes:     General: No scleral icterus.       Right eye: No discharge.        Left eye: No discharge.     Conjunctiva/sclera: Conjunctivae normal.     Pupils: Pupils are equal, round, and reactive to light.  Cardiovascular:     Rate and Rhythm: Normal rate and regular rhythm.     Heart sounds: Normal heart sounds. No murmur heard.   No friction rub. No gallop.  Pulmonary:     Effort: Pulmonary effort is normal. No respiratory distress.     Breath sounds: Normal breath sounds.  Abdominal:     General: Bowel sounds are normal. There is no distension.     Palpations: Abdomen is soft.     Tenderness: There is no abdominal tenderness. There is no guarding.  Musculoskeletal:        General: Normal range of motion.     Cervical back: Normal range of motion and neck supple.     Right lower leg: Edema present.     Left lower leg: Edema present.     Comments: Pitting edema to bilateral lower extremities.  Skin:    General: Skin is warm and dry.     Findings: Erythema (LLE with warmth) present. No rash.  Neurological:     Mental Status: She is alert and oriented to person, place, and time.     Sensory: No sensory deficit.     Motor: No weakness or abnormal muscle tone.     Coordination: Coordination normal.     Comments: Pupils reactive. No facial asymmetry noted. Cranial nerves appear grossly intact. Sensation intact to light touch on face, BUE and BLE.  Moving all extremities without difficulty.  Equal grip strength bilaterally.    ED Results / Procedures / Treatments   Labs (all labs ordered are listed, but only abnormal results are displayed) Labs Reviewed  COMPREHENSIVE METABOLIC PANEL - Abnormal; Notable for the following components:      Result  Value   Glucose, Bld 167 (*)    BUN 28 (*)    Albumin 3.4 (*)    All other components within normal limits  CBC WITH DIFFERENTIAL/PLATELET - Abnormal; Notable for the following components:    Hemoglobin 11.9 (*)    RDW 21.0 (*)    All other components within normal limits  BRAIN NATRIURETIC PEPTIDE - Abnormal; Notable for the following components:   B Natriuretic Peptide 991.1 (*)    All other components within normal limits  CBG MONITORING, ED - Abnormal; Notable for the following components:   Glucose-Capillary 178 (*)    All other components within normal limits  RESP PANEL BY RT-PCR (FLU A&B, COVID) ARPGX2  URINE CULTURE  EXPECTORATED SPUTUM ASSESSMENT W GRAM STAIN, RFLX TO RESP C  URINALYSIS, ROUTINE W REFLEX MICROSCOPIC  PROCALCITONIN  TROPONIN I (HIGH SENSITIVITY)  TROPONIN I (HIGH SENSITIVITY)    EKG None  Radiology DG Chest 2 View  Result Date: 08/31/2021 CLINICAL DATA:  chest pain EXAM: CHEST - 2 VIEW COMPARISON:  Chest x-ray 07/24/2021, CT chest 07/22/2021 FINDINGS: The heart and mediastinal contours are unchanged. Lingular linear atelectasis versus scarring. Question developing patchy airspace opacity within the right upper lobe. Similar appearing coarsened interstitial markings with no overt pulmonary edema. No pleural effusion. No pneumothorax. No acute osseous abnormality. IMPRESSION: Question developing patchy airspace opacity within the right upper lobe. Electronically Signed   By: Iven Finn M.D.   On: 08/31/2021 21:44   CT HEAD WO CONTRAST (5MM)  Result Date: 08/31/2021 CLINICAL DATA:  Headache EXAM: CT HEAD WITHOUT CONTRAST TECHNIQUE: Contiguous axial images were obtained from the base of the skull through the vertex without intravenous contrast. COMPARISON:  07/06/2021 FINDINGS: Brain: No evidence of acute infarction, hemorrhage, hydrocephalus, extra-axial collection or mass lesion/mass effect. Subcortical white matter and periventricular small vessel ischemic changes. Vascular: Intracranial atherosclerosis. Skull: Normal. Negative for fracture or focal lesion. Sinuses/Orbits: The visualized paranasal sinuses are essentially clear. The mastoid air  cells are unopacified. Other: None. IMPRESSION: No evidence of acute intracranial abnormality. Small vessel ischemic changes. Electronically Signed   By: Julian Hy M.D.   On: 08/31/2021 22:02    Procedures Procedures   Medications Ordered in ED Medications  cefTRIAXone (ROCEPHIN) 1 g in sodium chloride 0.9 % 100 mL IVPB (has no administration in time range)  azithromycin (ZITHROMAX) 500 mg in sodium chloride 0.9 % 250 mL IVPB (has no administration in time range)  furosemide (LASIX) injection 40 mg (has no administration in time range)  acetaminophen (TYLENOL) tablet 650 mg (650 mg Oral Given 08/31/21 2244)    ED Course  I have reviewed the triage vital signs and the nursing notes.  Pertinent labs & imaging results that were available during my care of the patient were reviewed by me and considered in my medical decision making (see chart for details).  Clinical Course as of 08/31/21 2352  Mon Aug 31, 2021  2244 Glucose-Capillary(!): 178 [HK]  2254 WBC: 6.3 [HK]  2254 Hemoglobin(!): 11.9 [HK]  2319 B Natriuretic Peptide(!): 991.1 [HK]  2331 Creatinine: 0.78 [HK]  2335 SARS Coronavirus 2 by RT PCR: NEGATIVE [HK]  2335 Influenza A By PCR: NEGATIVE [HK]    Clinical Course User Index [HK] Delia Heady, PA-C   MDM Rules/Calculators/A&P                           75 year old female presenting to the ED for generalized weakness, myalgias,  chest pain, headache, leg swelling.  Chest pain has been intermittent for several weeks.  Headache has been present since yesterday along with the generalized weakness and myalgias.  She has noticed gradually worsening lower extremity edema.  Also reports foul-smelling urine for several days.  On exam there is bilateral lower extremity edema that appears symmetric.  Her abdomen is soft.  She has no numbness or weakness on exam and no facial asymmetry.  She is hypertensive otherwise vital signs within normal limits.  Will obtain lab work, imaging  and reassess.  Work-up significant for BNP elevated to 900 this is slightly higher than her previous admission at 600.  Her CMP is unremarkable.  CBC is unremarkable.  Respiratory panel is negative.  Concern for possible respiratory infection in combination with cellulitis of left lower extremity as well as CHF exacerbation in the setting of her recent weight gain and BNP.  Urine is pending at this time but will cover her with Rocephin for her pneumonia and possible UTI.  Also ordered Lasix to help with possible fluid overload.  Daughter is concerned about care for her at home due to her generalized weakness and inability to ambulate as she typically does.  I feel that she will benefit for ongoing management of her infectious symptoms and generalized weakness.  Will admit to medicine service.    Portions of this note were generated with [employer]. Dictation errors may occur despite best attempts at proofreading.  Final Clinical Impression(s) / ED Diagnoses Final diagnoses:  Generalized weakness  Community acquired pneumonia of right upper lobe of lung  Cellulitis of left lower extremity  Weakness  Acute on chronic congestive heart failure, unspecified heart failure type [facility])    Rx / DC Orders ED Discharge Orders     None        Delia Heady, PA-C 08/31/21 2352    Jeanell Sparrow, DO 09/04/21 1954

## 2021-08-31 NOTE — ED Notes (Signed)
Patient transported to CT 

## 2021-09-01 ENCOUNTER — Other Ambulatory Visit: Payer: Self-pay

## 2021-09-01 DIAGNOSIS — F515 Nightmare disorder: Secondary | ICD-10-CM | POA: Diagnosis not present

## 2021-09-01 DIAGNOSIS — R4582 Worries: Secondary | ICD-10-CM | POA: Diagnosis not present

## 2021-09-01 DIAGNOSIS — L03116 Cellulitis of left lower limb: Secondary | ICD-10-CM

## 2021-09-01 DIAGNOSIS — I5023 Acute on chronic systolic (congestive) heart failure: Secondary | ICD-10-CM | POA: Diagnosis present

## 2021-09-01 DIAGNOSIS — G20 Parkinson's disease: Secondary | ICD-10-CM | POA: Diagnosis present

## 2021-09-01 DIAGNOSIS — E1169 Type 2 diabetes mellitus with other specified complication: Secondary | ICD-10-CM | POA: Diagnosis present

## 2021-09-01 DIAGNOSIS — Z961 Presence of intraocular lens: Secondary | ICD-10-CM | POA: Diagnosis present

## 2021-09-01 DIAGNOSIS — Z9842 Cataract extraction status, left eye: Secondary | ICD-10-CM | POA: Diagnosis not present

## 2021-09-01 DIAGNOSIS — R531 Weakness: Secondary | ICD-10-CM | POA: Diagnosis present

## 2021-09-01 DIAGNOSIS — R21 Rash and other nonspecific skin eruption: Secondary | ICD-10-CM | POA: Diagnosis present

## 2021-09-01 DIAGNOSIS — J181 Lobar pneumonia, unspecified organism: Secondary | ICD-10-CM | POA: Diagnosis present

## 2021-09-01 DIAGNOSIS — Z9981 Dependence on supplemental oxygen: Secondary | ICD-10-CM | POA: Diagnosis not present

## 2021-09-01 DIAGNOSIS — Z803 Family history of malignant neoplasm of breast: Secondary | ICD-10-CM | POA: Diagnosis not present

## 2021-09-01 DIAGNOSIS — G4730 Sleep apnea, unspecified: Secondary | ICD-10-CM | POA: Diagnosis present

## 2021-09-01 DIAGNOSIS — J189 Pneumonia, unspecified organism: Secondary | ICD-10-CM | POA: Diagnosis present

## 2021-09-01 DIAGNOSIS — E039 Hypothyroidism, unspecified: Secondary | ICD-10-CM | POA: Diagnosis present

## 2021-09-01 DIAGNOSIS — I509 Heart failure, unspecified: Secondary | ICD-10-CM | POA: Diagnosis not present

## 2021-09-01 DIAGNOSIS — F419 Anxiety disorder, unspecified: Secondary | ICD-10-CM | POA: Diagnosis present

## 2021-09-01 DIAGNOSIS — I447 Left bundle-branch block, unspecified: Secondary | ICD-10-CM | POA: Diagnosis present

## 2021-09-01 DIAGNOSIS — Z90710 Acquired absence of both cervix and uterus: Secondary | ICD-10-CM | POA: Diagnosis not present

## 2021-09-01 DIAGNOSIS — G9341 Metabolic encephalopathy: Secondary | ICD-10-CM | POA: Diagnosis present

## 2021-09-01 DIAGNOSIS — Z793 Long term (current) use of hormonal contraceptives: Secondary | ICD-10-CM | POA: Diagnosis not present

## 2021-09-01 DIAGNOSIS — Z20822 Contact with and (suspected) exposure to covid-19: Secondary | ICD-10-CM | POA: Diagnosis present

## 2021-09-01 DIAGNOSIS — I878 Other specified disorders of veins: Secondary | ICD-10-CM | POA: Diagnosis present

## 2021-09-01 DIAGNOSIS — Z7982 Long term (current) use of aspirin: Secondary | ICD-10-CM | POA: Diagnosis not present

## 2021-09-01 DIAGNOSIS — G2581 Restless legs syndrome: Secondary | ICD-10-CM | POA: Diagnosis present

## 2021-09-01 DIAGNOSIS — I152 Hypertension secondary to endocrine disorders: Secondary | ICD-10-CM | POA: Diagnosis present

## 2021-09-01 DIAGNOSIS — Z9841 Cataract extraction status, right eye: Secondary | ICD-10-CM | POA: Diagnosis not present

## 2021-09-01 DIAGNOSIS — Z7984 Long term (current) use of oral hypoglycemic drugs: Secondary | ICD-10-CM | POA: Diagnosis not present

## 2021-09-01 LAB — CBC
HCT: 36.5 % (ref 36.0–46.0)
Hemoglobin: 11.1 g/dL — ABNORMAL LOW (ref 12.0–15.0)
MCH: 26.2 pg (ref 26.0–34.0)
MCHC: 30.4 g/dL (ref 30.0–36.0)
MCV: 86.1 fL (ref 80.0–100.0)
Platelets: 162 10*3/uL (ref 150–400)
RBC: 4.24 MIL/uL (ref 3.87–5.11)
RDW: 21 % — ABNORMAL HIGH (ref 11.5–15.5)
WBC: 5.7 10*3/uL (ref 4.0–10.5)
nRBC: 0 % (ref 0.0–0.2)

## 2021-09-01 LAB — BASIC METABOLIC PANEL
Anion gap: 8 (ref 5–15)
BUN: 27 mg/dL — ABNORMAL HIGH (ref 8–23)
CO2: 25 mmol/L (ref 22–32)
Calcium: 8.7 mg/dL — ABNORMAL LOW (ref 8.9–10.3)
Chloride: 106 mmol/L (ref 98–111)
Creatinine, Ser: 0.83 mg/dL (ref 0.44–1.00)
GFR, Estimated: >60 mL/min (ref >60)
Glucose, Bld: 106 mg/dL — ABNORMAL HIGH (ref 70–99)
Potassium: 3.7 mmol/L (ref 3.5–5.1)
Sodium: 139 mmol/L (ref 135–145)

## 2021-09-01 LAB — CBG MONITORING, ED
Glucose-Capillary: 162 mg/dL — ABNORMAL HIGH (ref 70–99)
Glucose-Capillary: 161 mg/dL — ABNORMAL HIGH (ref 70–99)

## 2021-09-01 LAB — URINALYSIS, ROUTINE W REFLEX MICROSCOPIC
Bacteria, UA: NONE SEEN
Bilirubin Urine: NEGATIVE
Glucose, UA: NEGATIVE mg/dL
Hgb urine dipstick: NEGATIVE
Ketones, ur: 5 mg/dL — AB
Leukocytes,Ua: NEGATIVE
Nitrite: NEGATIVE
Protein, ur: 100 mg/dL — AB
Specific Gravity, Urine: 1.024 (ref 1.005–1.030)
pH: 5 (ref 5.0–8.0)

## 2021-09-01 LAB — STREP PNEUMONIAE URINARY ANTIGEN: Strep Pneumo Urinary Antigen: NEGATIVE

## 2021-09-01 LAB — URINE CULTURE

## 2021-09-01 LAB — CK: Total CK: 53 U/L (ref 38–234)

## 2021-09-01 LAB — PROCALCITONIN: Procalcitonin: <0.1 ng/mL

## 2021-09-01 LAB — MAGNESIUM: Magnesium: 1.5 mg/dL — ABNORMAL LOW (ref 1.7–2.4)

## 2021-09-01 LAB — TROPONIN I (HIGH SENSITIVITY)
Troponin I (High Sensitivity): 13 ng/L (ref <18)
Troponin I (High Sensitivity): 11 ng/L (ref <18)

## 2021-09-01 LAB — GLUCOSE, CAPILLARY
Glucose-Capillary: 131 mg/dL — ABNORMAL HIGH (ref 70–99)
Glucose-Capillary: 148 mg/dL — ABNORMAL HIGH (ref 70–99)

## 2021-09-01 MED ORDER — CARBIDOPA-LEVODOPA 25-100 MG PO TABS
2.5000 | ORAL_TABLET | Freq: Four times a day (QID) | ORAL | Status: DC
  Administered 2021-09-01 – 2021-09-07 (×24): 2.5 via ORAL
  Filled 2021-09-01: qty 3
  Filled 2021-09-01: qty 2.5
  Filled 2021-09-01 (×6): qty 3
  Filled 2021-09-01: qty 2.5
  Filled 2021-09-01: qty 3
  Filled 2021-09-01: qty 2.5
  Filled 2021-09-01 (×5): qty 3
  Filled 2021-09-01: qty 2.5
  Filled 2021-09-01 (×6): qty 3
  Filled 2021-09-01: qty 2.5
  Filled 2021-09-01 (×3): qty 3

## 2021-09-01 MED ORDER — HEPARIN SODIUM (PORCINE) 5000 UNIT/ML IJ SOLN
5000.0000 [IU] | Freq: Three times a day (TID) | INTRAMUSCULAR | Status: DC
  Administered 2021-09-01 – 2021-09-07 (×19): 5000 [IU] via SUBCUTANEOUS
  Filled 2021-09-01 (×19): qty 1

## 2021-09-01 MED ORDER — MAGNESIUM SULFATE 2 GM/50ML IV SOLN
2.0000 g | Freq: Once | INTRAVENOUS | Status: AC
  Administered 2021-09-01: 2 g via INTRAVENOUS
  Filled 2021-09-01: qty 50

## 2021-09-01 MED ORDER — ONDANSETRON HCL 4 MG/2ML IJ SOLN: 4.0000 mg | Freq: Four times a day (QID) | INTRAMUSCULAR | Status: DC | PRN

## 2021-09-01 MED ORDER — LORAZEPAM 2 MG/ML IJ SOLN
0.2500 mg | Freq: Once | INTRAMUSCULAR | Status: AC
  Administered 2021-09-01: 0.25 mg via INTRAVENOUS
  Filled 2021-09-01: qty 1

## 2021-09-01 MED ORDER — SENNOSIDES-DOCUSATE SODIUM 8.6-50 MG PO TABS: 1.0000 | ORAL_TABLET | Freq: Every evening | ORAL | Status: DC | PRN

## 2021-09-01 MED ORDER — QUETIAPINE FUMARATE 25 MG PO TABS
25.0000 mg | ORAL_TABLET | Freq: Every day | ORAL | Status: DC
  Administered 2021-09-02 – 2021-09-06 (×5): 25 mg via ORAL
  Filled 2021-09-01 (×5): qty 1

## 2021-09-01 MED ORDER — PRAMIPEXOLE DIHYDROCHLORIDE 0.125 MG PO TABS: 0.1250 mg | ORAL_TABLET | Freq: Four times a day (QID) | ORAL | Status: DC | PRN

## 2021-09-01 MED ORDER — ACETAMINOPHEN 650 MG RE SUPP: 650.0000 mg | Freq: Four times a day (QID) | RECTAL | Status: DC | PRN

## 2021-09-01 MED ORDER — ONDANSETRON HCL 4 MG PO TABS
4.0000 mg | ORAL_TABLET | Freq: Four times a day (QID) | ORAL | Status: DC | PRN
  Administered 2021-09-03: 4 mg via ORAL
  Filled 2021-09-01: qty 1

## 2021-09-01 MED ORDER — INSULIN ASPART 100 UNIT/ML IJ SOLN
0.0000 [IU] | Freq: Three times a day (TID) | INTRAMUSCULAR | Status: DC
  Administered 2021-09-01: 1 [IU] via SUBCUTANEOUS
  Administered 2021-09-01 (×2): 2 [IU] via SUBCUTANEOUS
  Administered 2021-09-02: 1 [IU] via SUBCUTANEOUS
  Administered 2021-09-02 – 2021-09-04 (×3): 3 [IU] via SUBCUTANEOUS
  Administered 2021-09-05: 2 [IU] via SUBCUTANEOUS
  Administered 2021-09-05: 1 [IU] via SUBCUTANEOUS
  Administered 2021-09-05: 2 [IU] via SUBCUTANEOUS
  Administered 2021-09-06: 1 [IU] via SUBCUTANEOUS
  Administered 2021-09-06: 2 [IU] via SUBCUTANEOUS
  Administered 2021-09-07: 5 [IU] via SUBCUTANEOUS
  Filled 2021-09-01: qty 0.09

## 2021-09-01 MED ORDER — TRAMADOL HCL 50 MG PO TABS
25.0000 mg | ORAL_TABLET | Freq: Two times a day (BID) | ORAL | Status: DC | PRN
  Administered 2021-09-01 – 2021-09-03 (×4): 25 mg via ORAL
  Filled 2021-09-01 (×4): qty 1

## 2021-09-01 MED ORDER — ACETAMINOPHEN 325 MG PO TABS
650.0000 mg | ORAL_TABLET | Freq: Four times a day (QID) | ORAL | Status: DC | PRN
  Administered 2021-09-03: 650 mg via ORAL
  Filled 2021-09-01 (×2): qty 2

## 2021-09-01 MED ORDER — FUROSEMIDE 10 MG/ML IJ SOLN
40.0000 mg | Freq: Two times a day (BID) | INTRAMUSCULAR | Status: DC
  Administered 2021-09-01 – 2021-09-04 (×8): 40 mg via INTRAVENOUS
  Filled 2021-09-01 (×10): qty 4

## 2021-09-01 MED ORDER — LEVOTHYROXINE SODIUM 75 MCG PO TABS
75.0000 ug | ORAL_TABLET | Freq: Every day | ORAL | Status: DC
  Administered 2021-09-02 – 2021-09-07 (×6): 75 ug via ORAL
  Filled 2021-09-01 (×6): qty 1

## 2021-09-01 MED ORDER — CARBIDOPA-LEVODOPA ER 50-200 MG PO TBCR
1.0000 | EXTENDED_RELEASE_TABLET | Freq: Every day | ORAL | Status: DC
  Administered 2021-09-01 – 2021-09-06 (×6): 1 via ORAL
  Filled 2021-09-01 (×8): qty 1

## 2021-09-01 MED ORDER — SODIUM CHLORIDE 0.9 % IV SOLN
1.0000 g | INTRAVENOUS | Status: DC
  Administered 2021-09-01 – 2021-09-07 (×7): 1 g via INTRAVENOUS
  Filled 2021-09-01 (×6): qty 10

## 2021-09-01 MED ORDER — SODIUM CHLORIDE 0.9% FLUSH
3.0000 mL | Freq: Two times a day (BID) | INTRAVENOUS | Status: DC
  Administered 2021-09-01 – 2021-09-07 (×14): 3 mL via INTRAVENOUS

## 2021-09-01 MED ORDER — LOSARTAN POTASSIUM 50 MG PO TABS
50.0000 mg | ORAL_TABLET | Freq: Every day | ORAL | Status: DC
  Administered 2021-09-01 – 2021-09-07 (×6): 50 mg via ORAL
  Filled 2021-09-01 (×5): qty 1
  Filled 2021-09-01: qty 2
  Filled 2021-09-01 (×2): qty 1

## 2021-09-01 MED ORDER — METOPROLOL SUCCINATE ER 50 MG PO TB24
50.0000 mg | ORAL_TABLET | Freq: Every day | ORAL | Status: DC
  Administered 2021-09-01 – 2021-09-07 (×6): 50 mg via ORAL
  Filled 2021-09-01 (×7): qty 1

## 2021-09-01 MED ORDER — SODIUM CHLORIDE 0.9 % IV SOLN
500.0000 mg | INTRAVENOUS | Status: DC
  Administered 2021-09-01 – 2021-09-02 (×2): 500 mg via INTRAVENOUS
  Filled 2021-09-01: qty 500

## 2021-09-01 NOTE — Progress Notes (Signed)
PROGRESS NOTE  Stephanie [name] EHU:[id number] DOB: 07-Jul-1946 DOA: 08/31/2021 PCP: [name], MD   LOS: 0 days   Brief Narrative / Interim history: 75 year old female with history of systolic CHF (TTE September 2022 showed EF 25-30%), chronic hypoxia with 2 L oxygen at night, DM2, HTN, HLD, Parkinson's disease, left bundle branch block, comes into the hospital for generalized weakness.  She was recently hospitalized in September 2022 for acute decompensated heart failure, was diuresed and weight on discharge was 182 pounds.  Of note, during prior hospitalization there was concern about a possible esophageal foreign body on CT angiogram, she was evaluated by speech and considered high risk for aspiration not a good candidate for barium esophagogram.  She was able to tolerate p.o. without issues.  Complains of weakness currently, inability to walk, and increased swelling in her legs.  Subjective / 24h Interval events: She tells me this morning "they are treating me like dirt", denies any chest pain, denies any shortness of breath.  States that her legs have been more swollen and they become red.  No nausea or vomiting, but does complain of a poor appetite.  She seems confused on my evaluation  Assessment & Plan: Principal Problem Acute on chronic systolic CHF-came into the hospital with worsening dyspnea, increased edema and elevated BNP.  She was started on furosemide 40 mg twice daily, continue.  -Monitor strict I&O's, daily weights.  Unclear weight currently since she is on ED stretcher and cannot stand. -Continue home medication with losartan, metoprolol -Most recent 2D echo done in September 2022 when she was admitted with fluid overload at [facility] showed an EF of 25-30%, severely decreased LV function, global hypokinesis.  Cardiology was consulted and followed patient while hospitalized over there however I do not think she has an outpatient cardiologist/ischemic work-up in the  past so not clear whether this is ischemic cardiomyopathy or she has other causes.  Active Problems Lobar pneumonia, right upper lobe-chest x-ray concerning for right upper lobe pneumonia.  She definitely is at risk for aspiration.  She has been placed on antibiotics with ceftriaxone and azithromycin, continue  Acute metabolic encephalopathy-she is confused this morning, possibly in the setting of lobar pneumonia/lower extremity cellulitis  Lower extremity cellulitis-there is a degree of venous stasis changes however the rash looks bright red, somewhat tender to palpation and cannot exclude a degree of cellulitis.  She is covered with ceftriaxone  Generalized weakness/deconditioning-normally ambulates with a walker but unable to do so in the last few days.  PT/OT pending  History of dysphagia-speech consulted  Essential hypertension-continue home medications as below  Hypothyroidism-continue Synthroid,  Parkinson's disease-continue Sinemet  Chest pain-EKG with LBBB however this is known to be chronic.  Denied any chest pain for me but reported to the nurse later on this morning.  Initial troponin on admission was negative, cycle x2  LBBB-noted  DM2 -continue sliding scale  CBG (last 3)  Recent Labs    08/31/21 2236 09/01/21 0834  GLUCAP 178* 162*    Scheduled Meds:  carbidopa-levodopa  1 tablet Oral QHS   carbidopa-levodopa  2.5 tablet Oral QID   furosemide  40 mg Intravenous BID   heparin  5,000 Units Subcutaneous Q8H   insulin aspart  0-9 Units Subcutaneous TID WC   [START ON 09/02/2021] levothyroxine  75 mcg Oral QAC breakfast   losartan  50 mg Oral Daily   metoprolol succinate  50 mg Oral Daily   sodium chloride flush  3 mL Intravenous  Q12H   Continuous Infusions:  azithromycin Stopped (09/01/21 0419)   cefTRIAXone (ROCEPHIN)  IV Stopped (09/01/21 0145)   PRN Meds:.acetaminophen **OR** acetaminophen, ondansetron **OR** ondansetron (ZOFRAN) IV, senna-docusate  Diet  Orders (From admission, onward)     Start     Ordered   09/01/21 0031  DIET DYS 3 Room service appropriate? Yes; Fluid consistency: Thin  Diet effective now       Question Answer Comment  Room service appropriate? Yes   Fluid consistency: Thin      09/01/21 0032            DVT prophylaxis: heparin injection 5,000 Units Start: 09/01/21 0600     Code Status: Full Code  Family Communication: will call family later  Status is: Observation  The patient will require care spanning > 2 midnights and should be moved to inpatient because: AMS, IV diuresis  Level of care: Telemetry  Consultants:  none  Procedures:  none  Microbiology  none  Antimicrobials: Ceftriaxone / Azithromycin 10/31 >>   Objective: Vitals:   09/01/21 0500 09/01/21 0615 09/01/21 0927 09/01/21 0929  BP: 136/72 (!) 157/91 (!) 162/87 (!) 162/87  Pulse: 78 84 (!) 117 (!) 125  Resp: 20 (!) 21  18  Temp:      TempSrc:      SpO2: 100% 96%  94%    Intake/Output Summary (Last 24 hours) at 09/01/2021 1007 Last data filed at 09/01/2021 0419 Gross per 24 hour  Intake 350.67 ml  Output --  Net 350.67 ml   There were no vitals filed for this visit.  Examination:  Constitutional: Ill-appearing female Eyes: no scleral icterus ENMT: Mucous membranes are moist.  Neck: normal, supple Respiratory: Diminished bilaterally, no wheezing, faint bibasilar crackles.  Shallow respirations Cardiovascular: Regular rate and rhythm, no murmurs / rubs / gallops.  2+ pitting lower extremity edema Abdomen: non distended, no tenderness. Bowel sounds positive.  Musculoskeletal: no clubbing / cyanosis.  Skin: Intense bright red rash bilateral lower extremity right more than left Neurologic: CN 2-12 grossly intact. Strength 5/5 in all 4.   Data Reviewed: I have independently reviewed following labs and imaging studies   CBC: Recent Labs  Lab 08/31/21 2223 09/01/21 0426  WBC 6.3 5.7  NEUTROABS 4.6  --   HGB 11.9*  11.1*  HCT 38.2 36.5  MCV 84.9 86.1  PLT 175 992   Basic Metabolic Panel: Recent Labs  Lab 08/31/21 2223 09/01/21 0426  NA 138 139  K 3.9 3.7  CL 105 106  CO2 23 25  GLUCOSE 167* 106*  BUN 28* 27*  CREATININE 0.78 0.83  CALCIUM 8.9 8.7*  MG  --  1.5*   Liver Function Tests: Recent Labs  Lab 08/31/21 2223  AST 15  ALT 14  ALKPHOS 97  BILITOT 0.8  PROT 7.4  ALBUMIN 3.4*   Coagulation Profile: No results for input(s): INR, PROTIME in the last 168 hours. HbA1C: No results for input(s): HGBA1C in the last 72 hours. CBG: Recent Labs  Lab 08/31/21 2236 09/01/21 0834  GLUCAP 178* 162*    Recent Results (from the past 240 hour(s))  Resp Panel by RT-PCR (Flu A&B, Covid) Nasopharyngeal Swab     Status: None   Collection Time: 08/31/21 10:25 PM   Specimen: Nasopharyngeal Swab; Nasopharyngeal(NP) swabs in vial transport medium  Result Value Ref Range Status   SARS Coronavirus 2 by RT PCR NEGATIVE NEGATIVE Final    Comment: (NOTE) SARS-CoV-2 target nucleic acids are NOT  DETECTED.  The SARS-CoV-2 RNA is generally detectable in upper respiratory specimens during the acute phase of infection. The lowest concentration of SARS-CoV-2 viral copies this assay can detect is 138 copies/mL. A negative result does not preclude SARS-Cov-2 infection and should not be used as the sole basis for treatment or other patient management decisions. A negative result may occur with  improper specimen collection/handling, submission of specimen other than nasopharyngeal swab, presence of viral mutation(s) within the areas targeted by this assay, and inadequate number of viral copies(<138 copies/mL). A negative result must be combined with clinical observations, patient history, and epidemiological information. The expected result is Negative.  Fact Sheet for Patients:  EntrepreneurPulse.com.au  Fact Sheet for Healthcare Providers:   IncredibleEmployment.be  This test is no t yet approved or cleared by the [place] FDA and  has been authorized for detection and/or diagnosis of SARS-CoV-2 by FDA under an Emergency Use Authorization (EUA). This EUA will remain  in effect (meaning this test can be used) for the duration of the COVID-19 declaration under Section 564(b)(1) of the Act, 21 U.S.C.section 360bbb-3(b)(1), unless the authorization is terminated  or revoked sooner.       Influenza A by PCR NEGATIVE NEGATIVE Final   Influenza B by PCR NEGATIVE NEGATIVE Final    Comment: (NOTE) The Xpert Xpress SARS-CoV-2/FLU/RSV plus assay is intended as an aid in the diagnosis of influenza from Nasopharyngeal swab specimens and should not be used as a sole basis for treatment. Nasal washings and aspirates are unacceptable for Xpert Xpress SARS-CoV-2/FLU/RSV testing.  Fact Sheet for Patients: EntrepreneurPulse.com.au  Fact Sheet for Healthcare Providers: IncredibleEmployment.be  This test is not yet approved or cleared by the [place] FDA and has been authorized for detection and/or diagnosis of SARS-CoV-2 by FDA under an Emergency Use Authorization (EUA). This EUA will remain in effect (meaning this test can be used) for the duration of the COVID-19 declaration under Section 564(b)(1) of the Act, 21 U.S.C. section 360bbb-3(b)(1), unless the authorization is terminated or revoked.  Performed at [facility], [place] [address] [place], [place] [address]      Radiology Studies: DG Chest 2 View  Result Date: 08/31/2021 CLINICAL DATA:  chest pain EXAM: CHEST - 2 VIEW COMPARISON:  Chest x-ray 07/24/2021, CT chest 07/22/2021 FINDINGS: The heart and mediastinal contours are unchanged. Lingular linear atelectasis versus scarring. Question developing patchy airspace opacity within the right upper lobe. Similar appearing coarsened interstitial  markings with no overt pulmonary edema. No pleural effusion. No pneumothorax. No acute osseous abnormality. IMPRESSION: Question developing patchy airspace opacity within the right upper lobe. Electronically Signed   By: Iven Finn M.D.   On: 08/31/2021 21:44   CT HEAD WO CONTRAST (5MM)  Result Date: 08/31/2021 CLINICAL DATA:  Headache EXAM: CT HEAD WITHOUT CONTRAST TECHNIQUE: Contiguous axial images were obtained from the base of the skull through the vertex without intravenous contrast. COMPARISON:  07/06/2021 FINDINGS: Brain: No evidence of acute infarction, hemorrhage, hydrocephalus, extra-axial collection or mass lesion/mass effect. Subcortical white matter and periventricular small vessel ischemic changes. Vascular: Intracranial atherosclerosis. Skull: Normal. Negative for fracture or focal lesion. Sinuses/Orbits: The visualized paranasal sinuses are essentially clear. The mastoid air cells are unopacified. Other: None. IMPRESSION: No evidence of acute intracranial abnormality. Small vessel ischemic changes. Electronically Signed   By: Julian Hy M.D.   On: 08/31/2021 22:02     Marzetta Board, MD, PhD [facility]  Between 7 am - 7 pm I am available,  please contact me via Amion (for emergencies) or Securechat (non urgent messages)  Between 7 pm - 7 am I am not available, please contact night coverage MD/APP via Amion

## 2021-09-01 NOTE — ED Provider Notes (Signed)
Patient is an admitted patient.  Nurse tech advised that the patient complained of chest pain.  They obtained an EKG.  This information is being communicated to admitting team by nursing staff, but at this time due to EKG reading acute MI, the tracing has been brought to me.  EKG is consistent with a left bundle branch but not STEMI.  Patient is exceptionally difficult to understand.  Speech is slightly pressured and garbled.  Patient is denying chest pain.  She reports, "that is a lie.  The demon said it.... I was born a [place]."  She asked me if I was with the psychiatry team. Physical Exam  BP (!) 157/91   Pulse 84   Temp 97.8 F (36.6 C) (Oral)   Resp (!) 21   SpO2 96%   Physical Exam Constitutional:      Comments: Patient is lying on her left side in the bed.  Appears deconditioned and very mildly short of breath but no significant increased work of breathing.  Speech is some kind of rambling plus soft but fairly rapid making it very difficult to understand.  HENT:     Mouth/Throat:     Pharynx: Oropharynx is clear.  Cardiovascular:     Comments: Monitor shows a rhythm that appears fairly regular with occasional PACs and interventricular conduction delay.  Heart is regular no gross rub murmur gallop. Pulmonary:     Comments: Very mild increased work of breathing.  Lungs are grossly clear. Musculoskeletal:     Comments: Both legs have about 1-2+ pitting edema and bright red erythema over the pretibial surfaces.  Skin:    General: Skin is warm and dry.  Neurological:     Comments: Patient is interacting and responding to my questions.  She does seem to have some level of situational awareness but also seems confused, psychotic or delirious.  No focal motor deficits.  She is holding the bed rail on the left side with both hands.  She is speaking directly to me and responding to questions.    ED Course/Procedures   Clinical Course as of 09/01/21 0909  Mon Aug 31, 2021  2244  Glucose-Capillary(!): 178 [HK]  2254 WBC: 6.3 [HK]  2254 Hemoglobin(!): 11.9 [HK]  2319 B Natriuretic Peptide(!): 991.1 [HK]  2331 Creatinine: 0.78 [HK]  2335 SARS Coronavirus 2 by RT PCR: NEGATIVE [HK]  2335 Influenza A By PCR: NEGATIVE [HK]    Clinical Course User Index [HK] Delia Heady, PA-C    Procedures  MDM  I have reviewed the medical record for additional history.  I reviewed patient's labs and EKG obtained by nurse tech.  EKG has an increased intraventricular conduction delay as compared to the first previous.  First previous however also has left bundle branch block.  First previous had flatter ST throughout where there is diffuse T wave with some repolarization consistent with left bundle branch block.  Not a STEMI pattern.  Based on clinical exam, I cannot ascertain if the patient has chest pain.  She is denying chest pain when I speak with her but is very difficult to understand and has some likely element of delirium or psychosis.  Patient's chest is clear to auscultation and does not appear to be having flash pulmonary edema or immediate respiratory distress/failure.  Nursing advises the admitting team is aware of the patient's complaint of chest pain and adding troponins.       Charlesetta Shanks, MD 09/01/21 [phone]

## 2021-09-01 NOTE — ED Notes (Addendum)
Pt confused. No following commands. Cont yelling "help", cannot articulate what she needs help with. Coughing after attempted with PO's.  MD made aware.   Swallow screen to be done.

## 2021-09-01 NOTE — ED Notes (Signed)
Complains of chest pain. EKG done, order placed.

## 2021-09-01 NOTE — Plan of Care (Signed)
  Problem: Education: Goal: Knowledge of General Education information will improve Description Including pain rating scale, medication(s)/side effects and non-pharmacologic comfort measures Outcome: Progressing   Problem: Health Behavior/Discharge Planning: Goal: Ability to manage health-related needs will improve Outcome: Progressing   

## 2021-09-01 NOTE — ED Notes (Signed)
Patient did not want to eat breakfast only wanted to drink juice

## 2021-09-01 NOTE — Progress Notes (Signed)
SLP Cancellation Note  Patient Details Name: Stephanie [name] MRN: [id number] DOB: [birth year]   Cancelled treatment:       Reason Eval/Treat Not Completed: (P) Medical issues which prohibited therapy (RN reports pt is confused and OT reports pt was yelling out when she attempted to see pt.  Will continue efforts.)  Kathleen Lime, MS [facility] SLP Acute Rehab Services Office [phone] Pager [phone]  Macario Golds 09/01/2021, 10:14 AM

## 2021-09-01 NOTE — Progress Notes (Signed)
PT Cancellation Note  Patient Details Name: LARAYAH CLUTE MRN: [id number] DOB: 04/22/1946   Cancelled Treatment:    Reason Eval/Treat Not Completed: Medical issues which prohibited therapy;Pain limiting ability to participate   Claretha Cooper 09/01/2021, 1:13 PM  Tresa End[place] Pager [phone] Office [phone]

## 2021-09-01 NOTE — Progress Notes (Signed)
09/01/21 1500  SLP Visit Information  SLP Received On 09/01/21  General Information  HPI Per MD notes "75 year old female with history of systolic CHF (TTE September 2022 showed EF 25-30%), chronic hypoxia with 2 L oxygen at night, DM2, HTN, HLD, Parkinson's disease, left bundle branch block, comes into the hospital for generalized weakness.  She was recently hospitalized in September 2022 for acute decompensated heart failure, was diuresed and weight on discharge was 182 pounds.  Of note, during prior hospitalization there was concern about a possible esophageal foreign body on CT angiogram, she was evaluated by speech and considered high risk for aspiration not a good candidate for barium swallow.  Pt."  Pt underwent an MBS that showed mild oropharyngeal dysphagia with decreased oral coordination, small amoun of retention and no aspiration/penetration - esophageal sweep not performed.  It was advised that pt follow precautions for dysphagia, GERD.  CXR now shows concerns for RUL opacity.  RN reports to this SlP that pt was coughing with medication by mouth. Per MD notes "pt c/o inability to walk, and increased swelling in her legs."  SLP swallow evaluation ordered - recommended by RN due to pt having difficulty swallowing medications.  Type of Study Bedside Swallow Evaluation  Previous Swallow Assessment MBS 07/2021  Diet Prior to this Study Dysphagia 3 (soft)  Temperature Spikes Noted N/A  Respiratory Status Room air (does not  keep oxygen in place and oxygen saturation in high 90s)  History of Recent Intubation No  Behavior/Cognition Alert;Cooperative;Impulsive;Agitated  Oral Cavity Assessment Dry;Erythema  Oral Care Completed by SLP No  Oral Cavity - Dentition Edentulous;Other (Comment) (dentures are in her purse - confirmed by SlP)  Self-Feeding Abilities Total assist;Refused PO  Patient Positioning Partially reclined (upright as much as pt will tolerate in stretcher)  Baseline Vocal  Quality Low vocal intensity  Volitional Cough Weak  Volitional Swallow Unable to elicit  Pain Assessment  Pain Assessment PAINAD  Breathing 0  Negative Vocalization 2  Facial Expression 2  Body Language 1  Consolability 1  PAINAD Score 6  Facial Expression 1  Body Movements 0  Muscle Tension 0  Compliance with ventilator (intubated pts.) N/A  Vocalization (extubated pts.) 2  CPOT Total 3  Pain Intervention(s)  (tramadol given by RN with applesauce)  Oral Motor/Sensory Function  Overall Oral Motor/Sensory Function Other (comment) (pt did not follow directions for oral motor exam, but able to seal lips on straw and spoon)  Ice Chips  Ice chips NT (pt refused)  Thin Liquid  Thin Liquid WFL  Presentation Straw  Nectar Thick Liquid  Nectar Thick Liquid NT  Honey Thick Liquid  Honey Thick Liquid NT  Puree  Puree Impaired  Oral Phase Impairments Reduced lingual movement/coordination;Reduced labial seal  Oral Phase Functional Implications Right anterior spillage  Other Comments minimal anterior spillage of applesauce from left with swallow attempts, decreased labial closure  Solid  Solid NT (due to pt's Agitation)  SLP - End of Session  Patient left Other (comment);with call bell/phone within reach (stretcher)  Suspected Esophageal Findings  Suspected Esophageal Findings Other (comment) (delayed cough noted approximately 5 minutes after intake - which pt admits has been occuring with increased incidence recently, will follow up)  SLP Assessment  Clinical Impression Statement (ACUTE ONLY) Pt asleep upon SLP arrival - when woke, she started hollering out within a few minutes repeatedly stating "I need to breathe" and admitting to pain in her "legs" when asked.  Pt refused to swallow anything  but eventually willing to swallow Tramadol for discomfort and her Parkinson's medications.  Pt did not follow directions for OME but able to seal lips on her straw and spoon.  Swallow  appeared timely with water via straw.  Anterior labial spillage of applesauce *trace amount propelled out- due to decreased labial seal. No oral retention present. Pt admits to taking her medications with applesauce whole prior to admit.  SLP laid head of stretcher back minimally for pt comfort.  Approximately after five minutes, pt started coughing - causing SLP to suspect some refluxing/esophageal issues (as previously diagnosed).  Did not test pt with solids due to her AMS, chronic complaint of discomfort - eventually she became calm and SLP left pt while she was "nodding off" with sides up on stretcher and partially reclined for comfort. Will follow up for dysphagia management, family education.  Pt with recent hospital admit noted, ? if she would benefit from a palliative consult for [place] given her chronic medical issues, progressive neurological disease and recurrent hospitalization.  SLP Visit Diagnosis Dysphagia, oral phase (R13.11);Dysphagia, unspecified (R13.10)  Impact on safety and function Mild aspiration risk  Other Related Risk Factors History of GERD;Deconditioning;Decreased respiratory status;History of esophageal-related issues;History of pneumonia;History of dysphagia  Swallow Evaluation Recommendations  SLP Diet Recommendations Dysphagia 1 (Puree);Thin liquid  Liquid Administration via Straw;Cup  Medication Administration Whole meds with puree  Supervision Full supervision/cueing for compensatory strategies  Compensations Slow rate;Small sips/bites;Minimize environmental distractions (intermittent dry swallow)  Postural Changes Seated upright at 90 degrees;Remain upright for at least 30 minutes after po intake  Treatment Plan  Oral Care Recommendations Oral care BID  Treatment Recommendations Therapy as outlined in treatment plan below  Follow up Recommendations Skilled Nursing facility  Speech Therapy Frequency (ACUTE ONLY) min 1 x/week  Treatment Duration 2 weeks  Interventions  Aspiration precaution training;Compensatory techniques;Patient/family education;Trials of upgraded texture/liquids;Diet toleration management by SLP  Prognosis  Prognosis for Safe Diet Advancement Guarded  Individuals Consulted  Consulted and Agree with Results and Recommendations Patient unable/family or caregiver not available  Progression Toward Goals  Progression toward goals Progressing toward goals  SLP Time Calculation  SLP Start Time (ACUTE ONLY) 1441  SLP Stop Time (ACUTE ONLY) 1515  SLP Time Calculation (min) (ACUTE ONLY) 34 min  SLP Evaluations  $ SLP Speech Visit 1 Visit  SLP Evaluations  $BSS Swallow 1 Procedure  Kathleen Lime, MS [facility] SLP Acute Rehab Services Office [phone] Pager [phone]

## 2021-09-01 NOTE — ED Notes (Signed)
Update provided to daughter via bedside at this time, all concerned addressed.

## 2021-09-01 NOTE — Progress Notes (Signed)
OT Cancellation Note  Patient Details Name: Stephanie [name] MRN: [id number] DOB: June 12, 1946   Cancelled Treatment:    Reason Eval/Treat Not Completed: Patient not medically ready nursing asking for therapy to hold off at this time with patient having reports of chest pain. OT will continue to follow.  Jackelyn Poling OTR/L, [place] Acute Rehabilitation Department Office# [phone] Pager# [phone]    09/01/2021, 11:32 AM

## 2021-09-02 DIAGNOSIS — J189 Pneumonia, unspecified organism: Secondary | ICD-10-CM

## 2021-09-02 LAB — COMPREHENSIVE METABOLIC PANEL
ALT: <5 U/L (ref 0–44)
AST: 11 U/L — ABNORMAL LOW (ref 15–41)
Albumin: 3.2 g/dL — ABNORMAL LOW (ref 3.5–5.0)
Alkaline Phosphatase: 89 U/L (ref 38–126)
Anion gap: 11 (ref 5–15)
BUN: 22 mg/dL (ref 8–23)
CO2: 26 mmol/L (ref 22–32)
Calcium: 8.3 mg/dL — ABNORMAL LOW (ref 8.9–10.3)
Chloride: 100 mmol/L (ref 98–111)
Creatinine, Ser: 0.62 mg/dL (ref 0.44–1.00)
GFR, Estimated: >60 mL/min (ref >60)
Glucose, Bld: 101 mg/dL — ABNORMAL HIGH (ref 70–99)
Potassium: 3.1 mmol/L — ABNORMAL LOW (ref 3.5–5.1)
Sodium: 137 mmol/L (ref 135–145)
Total Bilirubin: 0.9 mg/dL (ref 0.3–1.2)
Total Protein: 6.9 g/dL (ref 6.5–8.1)

## 2021-09-02 LAB — CBC
HCT: 36.3 % (ref 36.0–46.0)
Hemoglobin: 11.2 g/dL — ABNORMAL LOW (ref 12.0–15.0)
MCH: 26 pg (ref 26.0–34.0)
MCHC: 30.9 g/dL (ref 30.0–36.0)
MCV: 84.4 fL (ref 80.0–100.0)
Platelets: 150 10*3/uL (ref 150–400)
RBC: 4.3 MIL/uL (ref 3.87–5.11)
RDW: 21.2 % — ABNORMAL HIGH (ref 11.5–15.5)
WBC: 5.6 10*3/uL (ref 4.0–10.5)
nRBC: 0 % (ref 0.0–0.2)

## 2021-09-02 LAB — GLUCOSE, CAPILLARY
Glucose-Capillary: 139 mg/dL — ABNORMAL HIGH (ref 70–99)
Glucose-Capillary: 210 mg/dL — ABNORMAL HIGH (ref 70–99)
Glucose-Capillary: 86 mg/dL (ref 70–99)
Glucose-Capillary: 166 mg/dL — ABNORMAL HIGH (ref 70–99)

## 2021-09-02 LAB — LEGIONELLA PNEUMOPHILA SEROGP 1 UR AG: L. pneumophila Serogp 1 Ur Ag: NEGATIVE

## 2021-09-02 LAB — MAGNESIUM: Magnesium: 1.5 mg/dL — ABNORMAL LOW (ref 1.7–2.4)

## 2021-09-02 MED ORDER — SODIUM CHLORIDE 0.9 % IV SOLN: INTRAVENOUS | Status: DC | PRN

## 2021-09-02 MED ORDER — POTASSIUM CHLORIDE CRYS ER 20 MEQ PO TBCR
40.0000 meq | EXTENDED_RELEASE_TABLET | Freq: Two times a day (BID) | ORAL | Status: AC
  Administered 2021-09-02 (×2): 40 meq via ORAL
  Filled 2021-09-02 (×2): qty 2

## 2021-09-02 MED ORDER — MAGNESIUM SULFATE 4 GM/100ML IV SOLN
4.0000 g | Freq: Once | INTRAVENOUS | Status: AC
  Administered 2021-09-02: 4 g via INTRAVENOUS
  Filled 2021-09-02: qty 100

## 2021-09-02 MED ORDER — AZITHROMYCIN 250 MG PO TABS
500.0000 mg | ORAL_TABLET | Freq: Every day | ORAL | Status: DC
  Administered 2021-09-03 – 2021-09-07 (×5): 500 mg via ORAL
  Filled 2021-09-02 (×5): qty 2

## 2021-09-02 NOTE — Progress Notes (Signed)
Speech Language Pathology Treatment: Dysphagia  Patient Details Name: Stephanie [name] MRN: [id number] DOB: 1946/07/03 Today's Date: 09/02/2021 Time: 5053-9767 SLP Time Calculation (min) (ACUTE ONLY): 29 min  Assessment / Plan / Recommendation Clinical Impression  Session focused on dysphagia goals, daughter present and reports pt recently diagnosed with dysphagia and pt received HH SLP and was provided with "breathing device" causing SLP to suspect RMST.  Daughter reports pt has not used her "breathing device" and had recent hospitalization to remove large amount of fluid from lungs.  Pt today fully alert and sitting upright at edge of bed with daughter present.  Upper dentures placed by pt as she uses them throughout the day per her report.   Pt today demonstrates weak voice - admits she has a "headache" today and denies weaker voice at baseline.  Daughter noted in the room to shake her head "no" about pt having a strong voice.  Pt advised she does cough at times with both liquids and foods.     MBS flouro loops from study Sept 2022 -reviewed with pt and daughter.  Pt agreeable to consume a few bites of graham cracker, ice cream and water.  Pt tends to verbalize while eating/drinking - while food retained in mouth.  She benefits from verbal cues to cease talking to decrease aspiration risk - and daughter reports she often eats and speaks.  Adequate mastication with use of liquid to clear into pharynx - and no indication of aspiration noted.  Voice occasionally was wet and gurgly- She did not demonstrate awareness to wet voice but was able to clear with liquid swallow.  With direct question, pt admits to oral pocketing bilaterally on occasion.       Recommend advance diet to dys3/thin with strict precautions.  Will follow up briefly for tolerance of diet advancement given h/o dysphagia.   Pt has had recurrent hospital or ED visits in the last several months and has weakness - ? if she would  benefit from palliative referral to help establish GOC/code status.  Pt and daughter educated and signs upgraded in the room for pt/family/RN.    HPI HPI: Per MD notes "75 year old female with history of systolic CHF (TTE September 2022 showed EF 25-30%), chronic hypoxia with 2 L oxygen at night, DM2, HTN, HLD, Parkinson's disease, left bundle branch block, comes into the hospital for generalized weakness.  She was recently hospitalized in September 2022 for acute decompensated heart failure, was diuresed and weight on discharge was 182 pounds.  Of note, during prior hospitalization there was concern about a possible esophageal foreign body on CT angiogram, she was evaluated by speech and considered high risk for aspiration not a good candidate for barium swallow.  Pt."  Pt underwent an MBS that showed mild oropharyngeal dysphagia with decreased oral coordination, small amoun of retention and no aspiration/penetration - esophageal sweep not performed.  It was advised that pt follow precautions for dysphagia, GERD.  CXR now shows concerns for RUL opacity.  RN reports to this SlP that pt was coughing with medication by mouth. Per MD notes "pt c/o inability to walk, and increased swelling in her legs."  SLP swallow evaluation ordered - recommended by RN due to pt having difficulty swallowing medications.      SLP Plan  Continue with current plan of care      Recommendations for follow up therapy are one component of a multi-disciplinary discharge planning process, led by the attending physician.  Recommendations may be updated  based on patient status, additional functional criteria and insurance authorization.    Recommendations  Diet recommendations: Dysphagia 3 (mechanical soft);Thin liquid Liquids provided via: Cup;Straw Medication Administration: Whole meds with puree Supervision: Intermittent supervision to cue for compensatory strategies Compensations: Slow rate;Small sips/bites;Minimize  environmental distractions Postural Changes and/or Swallow Maneuvers: Seated upright 90 degrees;Upright 30-60 min after meal                Oral Care Recommendations: Oral care BID Follow up Recommendations: Skilled Nursing facility SLP Visit Diagnosis: Dysphagia, oral phase (R13.11);Dysphagia, unspecified (R13.10) Plan: Continue with current plan of care       GO              Kathleen Lime, MS Walker Office [phone] Pager [phone]   Macario Golds  09/02/2021, 12:59 PM

## 2021-09-02 NOTE — Evaluation (Signed)
Physical Therapy Evaluation Patient Details Name: Stephanie [name] MRN: [id number] DOB: 1946/01/23 Today's Date: 09/02/2021  History of Present Illness  75 year old female patient who presented to ED with AMS. patient was noted to recently be in hospital from 9/19-9/27 for CHF. PMH significant for chronic systolic CHF (most recent EF = 40-45%), LBBB, hypertension, hyperlipidemia, diabetes mellitus type 2, chronic iron deficiency anemia, Parkinson's disease and obesity  Clinical Impression  The patient is very cooperative with mobility. Ambulated x 25' x 2 using RW. Patient presents with baseline chorea-like uncontrolled movements of head and extremities.  Patient will benefit from  SNF for rehab to return to mod. Independent level. HR 67, SPo2 96%. Pt admitted with above diagnosis.  Pt currently with functional limitations due to the deficits listed below (see PT Problem List). Pt will benefit from skilled PT to increase their independence and safety with mobility to allow discharge to the venue listed below.          Recommendations for follow up therapy are one component of a multi-disciplinary discharge planning process, led by the attending physician.  Recommendations may be updated based on patient status, additional functional criteria and insurance authorization.  Follow Up Recommendations Skilled nursing-short term rehab (<3 hours/day)    Assistance Recommended at Discharge Intermittent Supervision/Assistance  Functional Status Assessment Patient has had a recent decline in their functional status and demonstrates the ability to make significant improvements in function in a reasonable and predictable amount of time.  Equipment Recommendations  None recommended by PT    Recommendations for Other Services       Precautions / Restrictions Precautions Precautions: Fall Precaution Comments: chorea movements of  head trunk and extremities      Mobility  Bed Mobility   Bed  Mobility: Supine to Sit;Sit to Supine     Supine to sit: Supervision Sit to supine: Supervision   General bed mobility comments: close supervision    Transfers Overall transfer level: Needs assistance Equipment used: Rolling walker (2 wheels) Transfers: Sit to/from Stand Sit to Stand: Min assist           General transfer comment: with RW with cues for sequencing and management of RW    Ambulation/Gait Ambulation/Gait assistance: Min assist Gait Distance (Feet): 25 Feet (x 2) Assistive device: Rolling walker (2 wheels) Gait Pattern/deviations: Step-to pattern;Step-through pattern;Staggering left;Staggering right     General Gait Details: close guard  for chorea movements  Stairs            Wheelchair Mobility    Modified Rankin (Stroke Patients Only)       Balance Overall balance assessment: Mild deficits observed, not formally tested                                           Pertinent Vitals/Pain Pain Assessment: No/denies pain    Home Living Family/patient expects to be discharged to:: Private residence Living Arrangements: Children Available Help at Discharge: Family;Available PRN/intermittently Type of Home: House Home Access: Stairs to enter   Entrance Stairs-Number of Steps: 5   Home Layout: One level Home Equipment: Rollator (4 wheels);Cane - quad Additional Comments: walking sticks, crutches    Prior Function Prior Level of Function : Independent/Modified Independent               ADLs Comments: independent with Rollator     Hand Dominance  Dominant Hand: Right    Extremity/Trunk Assessment   Upper Extremity Assessment Upper Extremity Assessment: Defer to OT evaluation    Lower Extremity Assessment Lower Extremity Assessment: Overall WFL for tasks assessed    Cervical / Trunk Assessment Cervical / Trunk Assessment: Other exceptions Cervical / Trunk Exceptions: chorea movements of head and trunk and  xtremities.  Communication   Communication: No difficulties  Cognition Arousal/Alertness: Awake/alert Behavior During Therapy: WFL for tasks assessed/performed Overall Cognitive Status: Within Functional Limits for tasks assessed                                          General Comments      Exercises General Exercises - Lower Extremity Ankle Circles/Pumps: AROM;Both;10 reps Long Arc Quad: AROM;Both;10 reps Hip Flexion/Marching: AROM;Both;10 reps;Standing;Seated   Assessment/Plan    PT Assessment Patient needs continued PT services  PT Problem List Decreased strength;Decreased mobility;Decreased safety awareness;Decreased activity tolerance;Decreased balance       PT Treatment Interventions DME instruction;Therapeutic activities;Gait training;Therapeutic exercise;Patient/family education;Functional mobility training    PT Goals (Current goals can be found in the Care Plan section)  Acute Rehab PT Goals Patient Stated Goal: I want to do good. PT Goal Formulation: With patient/family Time For Goal Achievement: 09/16/21 Potential to Achieve Goals: Good    Frequency Min 2X/week   Barriers to discharge Decreased caregiver support      Co-evaluation               AM-PAC PT "6 Clicks" Mobility  Outcome Measure Help needed turning from your back to your side while in a flat bed without using bedrails?: A Little Help needed moving from lying on your back to sitting on the side of a flat bed without using bedrails?: A Little Help needed moving to and from a bed to a chair (including a wheelchair)?: A Little Help needed standing up from a chair using your arms (e.g., wheelchair or bedside chair)?: A Little Help needed to walk in hospital room?: A Little Help needed climbing 3-5 steps with a railing? : A Little 6 Click Score: 18    End of Session Equipment Utilized During Treatment: Gait belt Activity Tolerance: Patient tolerated treatment  well Patient left: in bed;with call bell/phone within reach;with bed alarm set Nurse Communication: Mobility status PT Visit Diagnosis: Unsteadiness on feet (R26.81);Other symptoms and signs involving the nervous system (R29.898)    Time: 0383-3383 PT Time Calculation (min) (ACUTE ONLY): 36 min   Charges:   PT Evaluation $PT Eval Low Complexity: 1 Low PT Treatments $Gait Training: 8-22 mins        Tresa Endo PT Acute Rehabilitation Services Pager [phone] Office [phone]   Claretha Cooper 09/02/2021, 4:43 PM

## 2021-09-02 NOTE — Plan of Care (Signed)

## 2021-09-02 NOTE — Progress Notes (Signed)
PHARMACIST - PHYSICIAN COMMUNICATION  CONCERNING: Antibiotic IV to Oral Route Change Policy  RECOMMENDATION: This patient is receiving azithromycin by the intravenous route.  Based on criteria approved by the Pharmacy and Therapeutics Committee, the antibiotic(s) is/are being converted to the equivalent oral dose form(s).   DESCRIPTION: These criteria include:  Patient being treated for a respiratory tract infection, urinary tract infection, cellulitis or clostridium difficile associated diarrhea if on metronidazole  The patient is not neutropenic and does not exhibit a GI malabsorption state  The patient is eating (either orally or via tube) and/or has been taking other orally administered medications for a least 24 hours  The patient is improving clinically and has a Tmax < 100.5  If you have questions about this conversion, please contact the Pharmacy Department  []  ( [phone] )  [place] []  ( [phone] )  [facility] []  ( [phone]  [place] []  ( [phone] )  [facility] [x]  ( [phone] )  [facility]  

## 2021-09-02 NOTE — Evaluation (Signed)
Occupational Therapy Evaluation Patient Details Name: Stephanie [name] MRN: [id number] DOB: [birth year] Today's Date: 09/02/2021   History of Present Illness 75 year old female patient who presented to ED with AMS. patient was noted to recently be in hospital from 9/19-9/27 for CHF. PMH significant for chronic systolic CHF (most recent EF = 40-45%), LBBB, hypertension, hyperlipidemia, diabetes mellitus type 2, chronic iron deficiency anemia, Parkinson's disease and obesity   Clinical Impression   Patient is a confused 75 year old female who was admitted for above. Patient was living at home with family but alone during the day at rollator level PLOF. Currently, patient is mod A for transfers with max multimodal cues for redirection towards tasks. Patient's daughter was present during session and attempted to assist with redirection and provided PLOF. Patient is not at baseline at this time and remains a high falls risk. Patient would continue to benefit from skilled OT services at this time while admitted and after d/c to address noted deficits in order to improve overall safety and independence in ADLs.        Recommendations for follow up therapy are one component of a multi-disciplinary discharge planning process, led by the attending physician.  Recommendations may be updated based on patient status, additional functional criteria and insurance authorization.   Follow Up Recommendations  Skilled nursing-short term rehab (<3 hours/day)    Assistance Recommended at Discharge Frequent or constant Supervision/Assistance  Functional Status Assessment  Patient has had a recent decline in their functional status and demonstrates the ability to make significant improvements in function in a reasonable and predictable amount of time.  Equipment Recommendations  None recommended by OT    Recommendations for Other Services       Precautions / Restrictions Precautions Precautions:  Fall Restrictions Weight Bearing Restrictions: No      Mobility Bed Mobility Overal bed mobility: Needs Assistance             General bed mobility comments: patient was sitting on edge of bed at this time    Transfers Overall transfer level: Needs assistance Equipment used: Rolling walker (2 wheels) Transfers: Sit to/from [facility] Sit to Stand: Min assist Stand pivot transfers: Mod assist         General transfer comment: with RW with cues for sequencing and management of RW      Balance Overall balance assessment: Mild deficits observed, not formally tested                                         ADL either performed or assessed with clinical judgement   ADL Overall ADL's : Needs assistance/impaired Eating/Feeding: Set up Eating/Feeding Details (indicate cue type and reason): patient was noted to have pocketing in cheek after breakfast with increased cues to get food out of mouth. Grooming: Wash/dry face;Sitting;Set up Grooming Details (indicate cue type and reason): sitting on edge of bed Upper Body Bathing: Minimal assistance;Sitting Upper Body Bathing Details (indicate cue type and reason): cues for redirect. Lower Body Bathing: Moderate assistance;Sit to/from stand;Sitting/lateral leans;Cueing for safety;Cueing for sequencing   Upper Body Dressing : Minimal assistance;Sitting;Cueing for safety;Cueing for sequencing   Lower Body Dressing: Moderate assistance;Sitting/lateral leans;Sit to/from stand;Cueing for safety;Cueing for sequencing   Toilet Transfer: Moderate assistance;Ambulation;Rolling walker (2 wheels) Toilet Transfer Details (indicate cue type and reason): with continued cues and management of RW. patient has rollator at home  and reported as such various times during session Toileting- Clothing Manipulation and Hygiene: Moderate assistance;Sit to/from stand;Sitting/lateral lean;Cueing for safety;Cueing for  sequencing       Functional mobility during ADLs: Minimal assistance;Rolling walker (2 wheels) General ADL Comments: patient needed consistent cues for redirection. patient attempted to stand holding bed rails requesting therapist leave her standing there because it was comfortable. patietn was educated multiple times with daughter present to attempt to redirect patient to sit down. patient was able to stand in one spot for over 5 mins on this date.     Vision   Additional Comments: hard to assess with patients current cog level and need for redirection     Perception     Praxis      Pertinent Vitals/Pain Pain Assessment: Faces Faces Pain Scale: Hurts little more Pain Location: BLE when standing. Pain Descriptors / Indicators: Discomfort Pain Intervention(s): Monitored during session     Hand Dominance Right   Extremity/Trunk Assessment Upper Extremity Assessment Upper Extremity Assessment: Overall WFL for tasks assessed (no strength or ROM deficits)   Lower Extremity Assessment Lower Extremity Assessment: Defer to PT evaluation   Cervical / Trunk Assessment Cervical / Trunk Assessment: Normal   Communication Communication Communication: No difficulties   Cognition Arousal/Alertness: Awake/alert Behavior During Therapy: WFL for tasks assessed/performed Overall Cognitive Status: Difficult to assess                                 General Comments: patient was fixated on events in ED and hard to redirect towards functional tasks.     General Comments       Exercises     Shoulder Instructions      Home Living Family/patient expects to be discharged to:: Private residence Living Arrangements: Children Available Help at Discharge: Family;Available PRN/intermittently Type of Home: House Home Access: Stairs to enter [employer] of Steps: 5   Home Layout: One level     Bathroom Shower/Tub: Teacher, early years/pre:  Standard     Home Equipment: Rollator (4 wheels);Cane - quad   Additional Comments: walking sticks, crutches      Prior Functioning/Environment Prior Level of Function : Independent/Modified Independent               ADLs Comments: independent with Rollator        OT Problem List: Impaired balance (sitting and/or standing);Decreased activity tolerance;Decreased cognition;Decreased safety awareness;Decreased knowledge of precautions;Decreased knowledge of use of DME or AE      OT Treatment/Interventions: Self-care/ADL training;Therapeutic exercise;Neuromuscular education;DME and/or AE instruction;Therapeutic activities;Cognitive remediation/compensation;Patient/family education;Balance training    OT Goals(Current goals can be found in the care plan section) Acute Rehab OT Goals Patient Stated Goal: to get better OT Goal Formulation: With patient Time For Goal Achievement: 09/16/21 Potential to Achieve Goals: Good  OT Frequency: Min 2X/week   Barriers to D/C: Decreased caregiver support  patient is home alone during the day       Co-evaluation              AM-PAC OT "6 Clicks" Daily Activity     Outcome Measure Help from another person eating meals?: A Little Help from another person taking care of personal grooming?: A Little Help from another person toileting, which includes using toliet, bedpan, or urinal?: A Lot Help from another person bathing (including washing, rinsing, drying)?: A Lot Help from another person to put on and  taking off regular upper body clothing?: A Little Help from another person to put on and taking off regular lower body clothing?: A Lot 6 Click Score: 15   End of Session Equipment Utilized During Treatment: Rolling walker (2 wheels) Nurse Communication: Mobility status  Activity Tolerance: Patient tolerated treatment well Patient left: in bed;with call bell/phone within reach;with bed alarm set;with family/visitor present  OT  Visit Diagnosis: Unsteadiness on feet (R26.81);Other symptoms and signs involving cognitive function;Muscle weakness (generalized) (M62.81)                Time: 0735-4301 OT Time Calculation (min): 39 min Charges:  OT General Charges $OT Visit: 1 Visit OT Evaluation $OT Eval Low Complexity: 1 Low OT Treatments $Self Care/Home Management : 23-37 mins  Jackelyn Poling OTR/L, MS Acute Rehabilitation Department Office# [phone] Pager# [phone] 09/02/2021, 10:41 AM

## 2021-09-02 NOTE — Progress Notes (Signed)
PROGRESS NOTE    Stephanie [name]  VPX:[id number] DOB: April 30, 1946 DOA: 08/31/2021 PCP: [name], MD    Brief Narrative:  75 year old female with history of systolic CHF (TTE September 2022 showed EF 25-30%), chronic hypoxia with 2 L oxygen at night, DM2, HTN, HLD, Parkinson's disease, left bundle branch block, comes into the hospital for generalized weakness.  She was recently hospitalized in September 2022 for acute decompensated heart failure, was diuresed and weight on discharge was 182 pounds.  Of note, during prior hospitalization there was concern about a possible esophageal foreign body on CT angiogram, she was evaluated by speech and considered high risk for aspiration not a good candidate for barium esophagogram.  She was able to tolerate p.o. without issues.  Complains of weakness currently, inability to walk, and increased swelling in her legs  Assessment & Plan:   Principal Problem:   Acute on chronic HFrEF (heart failure with reduced ejection fraction) (HCC) Active Problems:   Type 2 diabetes mellitus (HCC)   Hypothyroid   Parkinson's disease (HCC)   Hyperlipidemia associated with type 2 diabetes mellitus ([place])   Hypertension associated with diabetes ([place])   CAP (community acquired pneumonia)  Principal Problem Acute on chronic systolic CHF-came into the hospital with worsening dyspnea, increased edema and elevated BNP.  She was started on furosemide 40 mg twice daily, continue.  -Wt down to 149lbs today, down 8lbs since admit -Continue home medication with losartan, metoprolol -Most recent 2D echo done in September 2022 when she was admitted with fluid overload at [facility] showed an EF of 25-30%, severely decreased LV function, global hypokinesis.   -Patient and family reports that patient is followed closely by cardiology as outpatient    Active Problems Lobar pneumonia, right upper lobe-chest x-ray concerning for right upper lobe pneumonia.  She definitely  is at risk for aspiration.  She has been placed on antibiotics with ceftriaxone and azithromycin, continue Parikh antibiotics for now   Acute metabolic encephalopathy-she is confused this morning, possibly in the setting of lobar pneumonia/lower extremity cellulitis   Lower extremity cellulitis-there is a degree of venous stasis changes however the rash looks bright red, somewhat tender to palpation and cannot exclude a degree of cellulitis.  She is covered with ceftriaxone   Generalized weakness/deconditioning-normally ambulates with a walker but unable to do so in the last few days.  PT/OT consulted, thus far recommendations for skilled nursing facility recommended   History of dysphagia-speech consulted, appreciate recommendations   Essential hypertension-continue home medications as below  Hypothyroidism-continue Synthroid as tolerated   Parkinson's disease-continue Sinemet as tolerated -Resting tremor noted on exam   Chest pain-EKG with LBBB however this is known to be chronic.   -Serial troponins reviewed, serially negative   LBBB-noted   DM2 -continue sliding scale as needed  DVT prophylaxis: Heparin subq Code Status: Full Family Communication: Pt in room, family is at bedside  Status is: Inpatient  Remains inpatient appropriate because: severity of ill ness       Consultants:    Procedures:    Antimicrobials: Anti-infectives (From admission, onward)    Start     Dose/Rate Route Frequency Ordered Stop   09/03/21 1000  azithromycin (ZITHROMAX) tablet 500 mg        500 mg Oral Daily 09/02/21 0905     09/01/21 0100  cefTRIAXone (ROCEPHIN) 1 g in sodium chloride 0.9 % 100 mL IVPB        1 g 200 mL/hr over 30 Minutes Intravenous  Every 24 hours 09/01/21 0033     09/01/21 0100  azithromycin (ZITHROMAX) 500 mg in sodium chloride 0.9 % 250 mL IVPB  Status:  Discontinued        500 mg 250 mL/hr over 60 Minutes Intravenous Every 24 hours 09/01/21 0033 09/02/21 0905    08/31/21 2345  cefTRIAXone (ROCEPHIN) 1 g in sodium chloride 0.9 % 100 mL IVPB  Status:  Discontinued        1 g 200 mL/hr over 30 Minutes Intravenous  Once 08/31/21 2332 09/01/21 0033   08/31/21 2345  azithromycin (ZITHROMAX) 500 mg in sodium chloride 0.9 % 250 mL IVPB  Status:  Discontinued        500 mg 250 mL/hr over 60 Minutes Intravenous  Once 08/31/21 2332 09/01/21 0033       Subjective: Feels better today. Reports being able to breathe better today  Objective: Vitals:   09/02/21 0330 09/02/21 0548 09/02/21 1039 09/02/21 1252  BP: 129/65 (!) 124/98 (!) 141/82 126/84  Pulse: 73 65 85 71  Resp: 18 18  16   Temp: 98.2 F (36.8 C) 98.5 F (36.9 C)  (!) 97.5 F (36.4 C)  TempSrc: Oral Oral  Oral  SpO2: 92% 96%  97%  Weight:  67.7 kg    Height:        Intake/Output Summary (Last 24 hours) at 09/02/2021 1644 Last data filed at 09/02/2021 1201 Gross per 24 hour  Intake 596.97 ml  Output 800 ml  Net -203.03 ml   Filed Weights   09/01/21 1531 09/02/21 0548  Weight: 71.3 kg 67.7 kg    Examination: General exam: Awake, laying in bed, in nad Respiratory system: Normal respiratory effort, no wheezing Cardiovascular system: regular rate, s1, s2 Gastrointestinal system: Soft, nondistended, positive BS Central nervous system: CN2-12 grossly intact, strength intact Extremities: Perfused, no clubbing Skin: Normal skin turgor, no notable skin lesions seen Psychiatry: Mood normal // no visual hallucinations   Data Reviewed: I have personally reviewed following labs and imaging studies  CBC: Recent Labs  Lab 08/31/21 2223 09/01/21 0426 09/02/21 0430  WBC 6.3 5.7 5.6  NEUTROABS 4.6  --   --   HGB 11.9* 11.1* 11.2*  HCT 38.2 36.5 36.3  MCV 84.9 86.1 84.4  PLT 175 162 062   Basic Metabolic Panel: Recent Labs  Lab 08/31/21 2223 09/01/21 0426 09/02/21 0430  NA 138 139 137  K 3.9 3.7 3.1*  CL 105 106 100  CO2 23 25 26   GLUCOSE 167* 106* 101*  BUN 28* 27* 22   CREATININE 0.78 0.83 0.62  CALCIUM 8.9 8.7* 8.3*  MG  --  1.5* 1.5*   GFR: Estimated Creatinine Clearance: 52.2 mL/min (by C-G formula based on SCr of 0.62 mg/dL). Liver Function Tests: Recent Labs  Lab 08/31/21 2223 09/02/21 0430  AST 15 11*  ALT 14 <5  ALKPHOS 97 89  BILITOT 0.8 0.9  PROT 7.4 6.9  ALBUMIN 3.4* 3.2*   No results for input(s): LIPASE, AMYLASE in the last 168 hours. No results for input(s): AMMONIA in the last 168 hours. Coagulation Profile: No results for input(s): INR, PROTIME in the last 168 hours. Cardiac Enzymes: Recent Labs  Lab 09/01/21 0426  CKTOTAL 53   BNP (last 3 results) No results for input(s): PROBNP in the last 8760 hours. HbA1C: No results for input(s): HGBA1C in the last 72 hours. CBG: Recent Labs  Lab 09/01/21 1619 09/01/21 2109 09/02/21 0913 09/02/21 1200 09/02/21 1620  GLUCAP 131*  148* 139* 210* 86   Lipid Profile: No results for input(s): CHOL, HDL, LDLCALC, TRIG, CHOLHDL, LDLDIRECT in the last 72 hours. Thyroid Function Tests: No results for input(s): TSH, T4TOTAL, FREET4, T3FREE, THYROIDAB in the last 72 hours. Anemia Panel: No results for input(s): VITAMINB12, FOLATE, FERRITIN, TIBC, IRON, RETICCTPCT in the last 72 hours. Sepsis Labs: Recent Labs  Lab 08/31/21 2330  PROCALCITON <0.10    Recent Results (from the past 240 hour(s))  Resp Panel by RT-PCR (Flu A&B, Covid) Nasopharyngeal Swab     Status: None   Collection Time: 08/31/21 10:25 PM   Specimen: Nasopharyngeal Swab; Nasopharyngeal(NP) swabs in vial transport medium  Result Value Ref Range Status   SARS Coronavirus 2 by RT PCR NEGATIVE NEGATIVE Final    Comment: (NOTE) SARS-CoV-2 target nucleic acids are NOT DETECTED.  The SARS-CoV-2 RNA is generally detectable in upper respiratory specimens during the acute phase of infection. The lowest concentration of SARS-CoV-2 viral copies this assay can detect is 138 copies/mL. A negative result does not preclude  SARS-Cov-2 infection and should not be used as the sole basis for treatment or other patient management decisions. A negative result may occur with  improper specimen collection/handling, submission of specimen other than nasopharyngeal swab, presence of viral mutation(s) within the areas targeted by this assay, and inadequate number of viral copies(<138 copies/mL). A negative result must be combined with clinical observations, patient history, and epidemiological information. The expected result is Negative.  Fact Sheet for Patients:  EntrepreneurPulse.com.au  Fact Sheet for Healthcare Providers:  IncredibleEmployment.be  This test is no t yet approved or cleared by the [place] FDA and  has been authorized for detection and/or diagnosis of SARS-CoV-2 by FDA under an Emergency Use Authorization (EUA). This EUA will remain  in effect (meaning this test can be used) for the duration of the COVID-19 declaration under Section 564(b)(1) of the Act, 21 U.S.C.section 360bbb-3(b)(1), unless the authorization is terminated  or revoked sooner.       Influenza A by PCR NEGATIVE NEGATIVE Final   Influenza B by PCR NEGATIVE NEGATIVE Final    Comment: (NOTE) The Xpert Xpress SARS-CoV-2/FLU/RSV plus assay is intended as an aid in the diagnosis of influenza from Nasopharyngeal swab specimens and should not be used as a sole basis for treatment. Nasal washings and aspirates are unacceptable for Xpert Xpress SARS-CoV-2/FLU/RSV testing.  Fact Sheet for Patients: EntrepreneurPulse.com.au  Fact Sheet for Healthcare Providers: IncredibleEmployment.be  This test is not yet approved or cleared by the [place] FDA and has been authorized for detection and/or diagnosis of SARS-CoV-2 by FDA under an Emergency Use Authorization (EUA). This EUA will remain in effect (meaning this test can be used) for the duration of  the COVID-19 declaration under Section 564(b)(1) of the Act, 21 U.S.C. section 360bbb-3(b)(1), unless the authorization is terminated or revoked.  Performed at [facility], [place] [address] [place], [place] [address]   Urine Culture     Status: Abnormal   Collection Time: 08/31/21 11:33 PM   Specimen: Urine, Clean Catch  Result Value Ref Range Status   Specimen Description   Final    URINE, CLEAN CATCH Performed at [facility], [place] [address], [place], [place] [address]    Special Requests   Final    NONE Performed at [facility], [place] [address], [place], [place] [address]    Culture MULTIPLE SPECIES PRESENT, SUGGEST RECOLLECTION (A)  Final   Report Status 09/01/2021  FINAL  Final     Radiology Studies: DG Chest 2 View  Result Date: 08/31/2021 CLINICAL DATA:  chest pain EXAM: CHEST - 2 VIEW COMPARISON:  Chest x-ray 07/24/2021, CT chest 07/22/2021 FINDINGS: The heart and mediastinal contours are unchanged. Lingular linear atelectasis versus scarring. Question developing patchy airspace opacity within the right upper lobe. Similar appearing coarsened interstitial markings with no overt pulmonary edema. No pleural effusion. No pneumothorax. No acute osseous abnormality. IMPRESSION: Question developing patchy airspace opacity within the right upper lobe. Electronically Signed   By: Iven Finn M.D.   On: 08/31/2021 21:44   CT HEAD WO CONTRAST (5MM)  Result Date: 08/31/2021 CLINICAL DATA:  Headache EXAM: CT HEAD WITHOUT CONTRAST TECHNIQUE: Contiguous axial images were obtained from the base of the skull through the vertex without intravenous contrast. COMPARISON:  07/06/2021 FINDINGS: Brain: No evidence of acute infarction, hemorrhage, hydrocephalus, extra-axial collection or mass lesion/mass effect. Subcortical white matter and periventricular small vessel ischemic changes. Vascular: Intracranial atherosclerosis. Skull:  Normal. Negative for fracture or focal lesion. Sinuses/Orbits: The visualized paranasal sinuses are essentially clear. The mastoid air cells are unopacified. Other: None. IMPRESSION: No evidence of acute intracranial abnormality. Small vessel ischemic changes. Electronically Signed   By: Julian Hy M.D.   On: 08/31/2021 22:02    Scheduled Meds:  [START ON 09/03/2021] azithromycin  500 mg Oral Daily   carbidopa-levodopa  1 tablet Oral QHS   carbidopa-levodopa  2.5 tablet Oral QID   furosemide  40 mg Intravenous BID   heparin  5,000 Units Subcutaneous Q8H   insulin aspart  0-9 Units Subcutaneous TID WC   levothyroxine  75 mcg Oral QAC breakfast   losartan  50 mg Oral Daily   metoprolol succinate  50 mg Oral Daily   potassium chloride  40 mEq Oral BID   QUEtiapine  25 mg Oral QHS   sodium chloride flush  3 mL Intravenous Q12H   Continuous Infusions:  sodium chloride Stopped (09/02/21 0417)   cefTRIAXone (ROCEPHIN)  IV Stopped (09/02/21 0158)     LOS: 1 day   Marylu Lund, MD [facility] Pager On Amion  If 7PM-7AM, please contact night-coverage 09/02/2021, 4:44 PM

## 2021-09-02 NOTE — Progress Notes (Signed)
SLP Cancellation Note  Patient Details Name: Stephanie [name] MRN: [id number] DOB: 07-05-1946   Cancelled treatment:       Reason Eval/Treat Not Completed: Other (comment);Patient at procedure or test/unavailable (pt working with OT at this time, will continue efforts) Pt sitting upright at edge of bed.   Kathleen Lime, MS [facility] SLP Acute Rehab Services Office [phone] Pager [phone]    Macario Golds 09/02/2021, 9:42 AM

## 2021-09-03 ENCOUNTER — Inpatient Hospital Stay (HOSPITAL_COMMUNITY): Payer: Medicare Other

## 2021-09-03 LAB — COMPREHENSIVE METABOLIC PANEL
ALT: <5 U/L (ref 0–44)
AST: 11 U/L — ABNORMAL LOW (ref 15–41)
Albumin: 3.1 g/dL — ABNORMAL LOW (ref 3.5–5.0)
Alkaline Phosphatase: 85 U/L (ref 38–126)
Anion gap: 9 (ref 5–15)
BUN: 20 mg/dL (ref 8–23)
CO2: 27 mmol/L (ref 22–32)
Calcium: 8.3 mg/dL — ABNORMAL LOW (ref 8.9–10.3)
Chloride: 102 mmol/L (ref 98–111)
Creatinine, Ser: 0.66 mg/dL (ref 0.44–1.00)
GFR, Estimated: >60 mL/min (ref >60)
Glucose, Bld: 130 mg/dL — ABNORMAL HIGH (ref 70–99)
Potassium: 3.8 mmol/L (ref 3.5–5.1)
Sodium: 138 mmol/L (ref 135–145)
Total Bilirubin: 0.6 mg/dL (ref 0.3–1.2)
Total Protein: 6.7 g/dL (ref 6.5–8.1)

## 2021-09-03 LAB — CBC
HCT: 35.1 % — ABNORMAL LOW (ref 36.0–46.0)
Hemoglobin: 11 g/dL — ABNORMAL LOW (ref 12.0–15.0)
MCH: 26.2 pg (ref 26.0–34.0)
MCHC: 31.3 g/dL (ref 30.0–36.0)
MCV: 83.6 fL (ref 80.0–100.0)
Platelets: 160 10*3/uL (ref 150–400)
RBC: 4.2 MIL/uL (ref 3.87–5.11)
RDW: 21.2 % — ABNORMAL HIGH (ref 11.5–15.5)
WBC: 6.1 10*3/uL (ref 4.0–10.5)
nRBC: 0 % (ref 0.0–0.2)

## 2021-09-03 LAB — GLUCOSE, CAPILLARY
Glucose-Capillary: 113 mg/dL — ABNORMAL HIGH (ref 70–99)
Glucose-Capillary: 219 mg/dL — ABNORMAL HIGH (ref 70–99)
Glucose-Capillary: 63 mg/dL — ABNORMAL LOW (ref 70–99)
Glucose-Capillary: 168 mg/dL — ABNORMAL HIGH (ref 70–99)
Glucose-Capillary: 146 mg/dL — ABNORMAL HIGH (ref 70–99)
Glucose-Capillary: 141 mg/dL — ABNORMAL HIGH (ref 70–99)

## 2021-09-03 MED ORDER — OXYCODONE-ACETAMINOPHEN 5-325 MG PO TABS
1.0000 | ORAL_TABLET | Freq: Four times a day (QID) | ORAL | Status: DC | PRN
  Administered 2021-09-03 – 2021-09-07 (×9): 1 via ORAL
  Filled 2021-09-03 (×9): qty 1

## 2021-09-03 MED ORDER — ALLOPURINOL 100 MG PO TABS
100.0000 mg | ORAL_TABLET | Freq: Every day | ORAL | Status: DC
  Administered 2021-09-03 – 2021-09-07 (×5): 100 mg via ORAL
  Filled 2021-09-03 (×5): qty 1

## 2021-09-03 MED ORDER — GABAPENTIN 300 MG PO CAPS
300.0000 mg | ORAL_CAPSULE | Freq: Every day | ORAL | Status: DC
  Administered 2021-09-03 – 2021-09-06 (×4): 300 mg via ORAL
  Filled 2021-09-03 (×4): qty 1

## 2021-09-03 MED ORDER — ALPRAZOLAM 0.25 MG PO TABS
0.2500 mg | ORAL_TABLET | Freq: Two times a day (BID) | ORAL | Status: DC | PRN
  Administered 2021-09-03 – 2021-09-04 (×2): 0.25 mg via ORAL
  Filled 2021-09-03 (×2): qty 1

## 2021-09-03 NOTE — Progress Notes (Signed)
Mobility Specialist - Progress Note    09/03/21 1455  Mobility  Activity Sat and stood x 3  Level of Assistance Minimal assist, patient does 75% or more  Assistive Device Front wheel walker  Mobility Sit up in bed/chair position for meals  Mobility Response Tolerated well  Mobility performed by Mobility specialist  $Mobility charge 1 Mobility   Upon entry pt refused mobility and stated feeling nauseous and c/o of a severe headache. Pt was encouraged to mobilize today and completed 2 sit to stands with Min A and having RW in front. Family expressed that pt already ambulated to bathroom and prefers to rest for the remainder of the day. Family and pt stated pt prefers to sit in a firm chair instead of bed or recliner. Pt left sitting in chair with family in room.   [place] Specialist Acute Rehabilitation Services Phone: [phone] 09/03/21, 2:58 PM

## 2021-09-03 NOTE — Progress Notes (Signed)
Speech Language Pathology Treatment: Dysphagia  Patient Details Name: Stephanie [name] MRN: [id number] DOB: August 05, 1946 Today's Date: 09/03/2021 Time: 1800-1830 SLP Time Calculation (min) (ACUTE ONLY): 30 min  Assessment / Plan / Recommendation Clinical Impression  Second session completed to allow observation with full meal with daughter present.  SLP cut up pt's chicken and she consumed sticky rice, chicken with gravy, peas and tea.  Pt uses liquids to orally transit solids into pharynx - no indication of aspiration using this process - but it is present given her dysphagia.  If pt notes coughing while washing food from mouth into throat -advised she try thicker liquids, Ensure or puree to help transit particles to decrease risk. Pt benefited from moderate verbal cues to cease talking and focus on her meal.  NO indication of aspiration during 1/3 of meal SLP observed. However pt did point to chest to indicate area of occasional retention.  Recommend small frequent meals.  Esophagram order placed by MD, thank you.  Advised pt and daughter to order and phoned xray re: test for 09/04/2021.  Recommend pt be npo in am - (3 hours before MBS) - hold breakfast until testing time confirmed. Marland Kitchen SLP will sign off at this time as pt has mild oropharyngeal dysphagia and compensations reviewed for oropharyngeal deficits as well as reflux/esophageal issues.  Thanks for allowing this SLP to assist with this pt's care plan.  Dys3/thin continue due to pt using only upper denture and needing meats chopped at home.  Pt, daughter, RN agreeable to plan    HPI HPI: Per MD notes "75 year old female with history of systolic CHF (TTE September 2022 showed EF 25-30%), chronic hypoxia with 2 L oxygen at night, DM2, HTN, HLD, Parkinson's disease, left bundle branch block, comes into the hospital for generalized weakness.  She was recently hospitalized in September 2022 for acute decompensated heart failure, was diuresed and weight  on discharge was 182 pounds.  Of note, during prior hospitalization there was concern about a possible esophageal foreign body on CT angiogram, she was evaluated by speech and considered high risk for aspiration not a good candidate for barium swallow."  Pt underwent an MBS that showed mild oropharyngeal dysphagia with decreased oral coordination, small amount of retention and no aspiration/penetration - esophageal sweep not performed.  It was advised that pt follow precautions for dysphagia, GERD.  CXR now shows concerns for RUL opacity.  RN reports to this SlP that pt was coughing with medication by mouth. Per MD notes "pt c/o inability to walk, and increased swelling in her legs".  SLP swallow evaluation ordered - recommended by RN.  BSE completed and follow up indicated.      SLP Plan  All goals met      Recommendations for follow up therapy are one component of a multi-disciplinary discharge planning process, led by the attending physician.  Recommendations may be updated based on patient status, additional functional criteria and insurance authorization.    Recommendations  Diet recommendations: Dysphagia 3 (mechanical soft);Thin liquid Liquids provided via: Cup;Straw Medication Administration: Whole meds with puree (start and follow with liquids, one pill at a time) Supervision: Intermittent supervision to cue for compensatory strategies Compensations: Slow rate;Small sips/bites;Minimize environmental distractions Postural Changes and/or Swallow Maneuvers: Seated upright 90 degrees;Upright 30-60 min after meal                Oral Care Recommendations: Oral care BID Follow up Recommendations: Skilled Nursing facility SLP Visit Diagnosis: Dysphagia, oral phase (R13.11);Dysphagia,  unspecified (R13.10) Plan: All goals met       GO              Stephanie Lime, MS [facility] SLP Acute Rehab Services Office [phone] Pager [phone]   Stephanie [name]  09/03/2021, 7:28 PM

## 2021-09-03 NOTE — Progress Notes (Signed)
RN came in to give pt medications. NT and daughter had walked patient to bathroom and both described patient's behavior changed while on the commode. Stated patient had a blank stare all of a sudden, no blinking noted. Pt assisted back to chair. Pt states "I just feel strange". Denies pain, dizziness, lightheadedness. VSS. CBG 146. Dr. Wyline Copas paged and made aware. New orders received.

## 2021-09-03 NOTE — Plan of Care (Signed)

## 2021-09-03 NOTE — NC FL2 (Signed)
[place] LEVEL OF CARE SCREENING TOOL     IDENTIFICATION  Patient Name: Stephanie [name] Birthdate: 1946/03/16 Sex: female Admission Date (Current Location): 08/31/2021  [facility] and [place] Number:  Herbalist and Address:  [facility],  [place], [place]      Provider Number: [id number]  Attending Physician Name and Address:  Donne Hazel, MD  Relative Name and Phone Number:  [name] [phone]    Current Level of Care: Hospital Recommended Level of Care: [place] Prior Approval Number:    Date Approved/Denied:   PASRR Number: [id number]  Discharge Plan: SNF    Current Diagnoses: Patient Active Problem List   Diagnosis Date Noted   CAP (community acquired pneumonia) 09/01/2021   Acute on chronic HFrEF (heart failure with reduced ejection fraction) ([place]) 08/31/2021   Hypertension associated with diabetes ([place]) 08/31/2021   Acute on chronic systolic (congestive) heart failure ([place]) 07/20/2021   SOB (shortness of breath) 07/20/2021   Acute respiratory failure with hypoxia ([place]) 07/20/2021   Type 2 diabetes mellitus ([place])    Hypothyroid    Parkinson's disease ([place])    Hyperlipidemia associated with type 2 diabetes mellitus ([place])    Gout, unspecified 11/01/2020   Left bundle branch block 05/09/2019   Hyponatremia 12/31/2015    Orientation RESPIRATION BLADDER Height & Weight     Self, Time, Situation, Place      Weight: 70 kg Height:  5' (152.4 cm)  BEHAVIORAL SYMPTOMS/MOOD NEUROLOGICAL BOWEL NUTRITION STATUS      Continent    AMBULATORY STATUS COMMUNICATION OF NEEDS Skin   Limited Assist Verbally Normal                       Personal Care Assistance Level of Assistance  Bathing, Feeding, Dressing Bathing Assistance: Limited assistance Feeding assistance: Limited assistance Dressing Assistance: Limited assistance     Functional Limitations Info  Sight, Hearing, Speech  Sight Info: Adequate Hearing Info: Adequate Speech Info: Impaired (dysphagia 3)    SPECIAL CARE FACTORS FREQUENCY  PT (By licensed PT), OT (By licensed OT)     PT Frequency:  (5x week) OT Frequency:  (5x week)            Contractures Contractures Info: Not present    Additional Factors Info  Code Status, Allergies, Psychotropic Code Status Info:  (Full) Allergies Info:  (NKA) Psychotropic Info:  (Seroquel HS see MAR)         Current Medications (09/03/2021):  This is the current hospital active medication list Current Facility-Administered Medications  Medication Dose Route Frequency Provider Last Rate Last Admin   0.9 %  sodium chloride infusion   Intravenous PRN Caren Griffins, MD   Stopped at 09/02/21 0417   acetaminophen (TYLENOL) tablet 650 mg  650 mg Oral Q6H PRN Lenore Cordia, MD   650 mg at 09/03/21 0559   Or   acetaminophen (TYLENOL) suppository 650 mg  650 mg Rectal Q6H PRN Lenore Cordia, MD       allopurinol (ZYLOPRIM) tablet 100 mg  100 mg Oral Daily Donne Hazel, MD       ALPRAZolam Duanne Moron) tablet 0.25 mg  0.25 mg Oral BID PRN Donne Hazel, MD       azithromycin [facility]) tablet 500 mg  500 mg Oral Daily Leodis Sias T, RPH   500 mg at 09/03/21 1025   carbidopa-levodopa (SINEMET CR)  50-200 MG per tablet controlled release 1 tablet  1 tablet Oral QHS Lenore Cordia, MD   1 tablet at 09/02/21 2153   carbidopa-levodopa (SINEMET IR) 25-100 MG per tablet immediate release 2.5 tablet  2.5 tablet Oral QID Zada Finders R, MD   2.5 tablet at 09/03/21 1327   cefTRIAXone (ROCEPHIN) 1 g in sodium chloride 0.9 % 100 mL IVPB  1 g Intravenous Q24H Lenore Cordia, MD   Stopped at 09/03/21 0103   furosemide (LASIX) injection 40 mg  40 mg Intravenous BID Zada Finders R, MD   40 mg at 09/03/21 1026   gabapentin (NEURONTIN) capsule 300 mg  300 mg Oral QHS Donne Hazel, MD       heparin injection 5,000 Units  5,000 Units Subcutaneous Q8H Zada Finders R, MD    5,000 Units at 09/03/21 1335   insulin aspart (novoLOG) injection 0-9 Units  0-9 Units Subcutaneous TID WC Lenore Cordia, MD   3 Units at 09/03/21 1247   levothyroxine (SYNTHROID) tablet 75 mcg  75 mcg Oral QAC breakfast Lenore Cordia, MD   75 mcg at 09/03/21 0559   losartan (COZAAR) tablet 50 mg  50 mg Oral Daily Zada Finders R, MD   50 mg at 09/03/21 1025   metoprolol succinate (TOPROL-XL) 24 hr tablet 50 mg  50 mg Oral Daily Zada Finders R, MD   50 mg at 09/03/21 1025   ondansetron (ZOFRAN) tablet 4 mg  4 mg Oral Q6H PRN Lenore Cordia, MD       Or   ondansetron (ZOFRAN) injection 4 mg  4 mg Intravenous Q6H PRN Lenore Cordia, MD       oxyCODONE-acetaminophen (PERCOCET/ROXICET) 5-325 MG per tablet 1 tablet  1 tablet Oral Q6H PRN Donne Hazel, MD   1 tablet at 09/03/21 1327   QUEtiapine (SEROQUEL) tablet 25 mg  25 mg Oral QHS Caren Griffins, MD   25 mg at 09/02/21 2151   senna-docusate (Senokot-S) tablet 1 tablet  1 tablet Oral QHS PRN Zada Finders R, MD       sodium chloride flush (NS) 0.9 % injection 3 mL  3 mL Intravenous Q12H Zada Finders R, MD   3 mL at 09/03/21 1026   traMADol (ULTRAM) tablet 25 mg  25 mg Oral Q12H PRN Caren Griffins, MD   25 mg at 09/03/21 1027     Discharge Medications: Please see discharge summary for a list of discharge medications.  Relevant Imaging Results:  Relevant Lab Results:   Additional Information ss#[id number];[employer] x2  Dessa Phi, RN

## 2021-09-03 NOTE — Progress Notes (Signed)
Speech Language Pathology Treatment: Dysphagia  Patient Details Name: Stephanie [name] MRN: [id number] DOB: 1946/06/27 Today's Date: 09/03/2021 Time: 1730-1750 SLP Time Calculation (min) (ACUTE ONLY): 20 min  Assessment / Plan / Recommendation Clinical Impression  Pt seen to address dysphagia goals including po tolerance.  Pt sitting up in her chair - presents with significant choreic movements - daughter and pt report current movements more than normal.   Pt to receive Xanax to help her sleep later tonight as she is not resting well.  While SLP was reviewing chart, heard pt coughing from outside the room *after NT took her OJ due to low blood glucose* concerning for potential infiltration of OJ into airway.  Wet voice noted at baseline- which daughter and pt confirm has been present for "years"  causing SLP to question potential secretion aspiration.  Provided pt with nectar thick apple juice for trials if note increase coughing with thin liquids when pt has chorea- clinically may be helpful to decrease velocity if pt prematurely spills thin into pharynx/larynx.  Pt consumed approx 3 ounces applejuice via straw - no indication of airway compromise and pt reports increased ease of oral control.  However after 3 ounces within 2 minutes, pt reported "choking sensation" pointing to distal pharynx.  Cued her to conduct dry swallows to help with clearance, which was effective.  Given pt report of coughing up frothy secretions at times and current symptoms - along with CT chest findings of 07/2021 - recommend consider esophagram.  Provided pt and daughter with compensations for esophageal dysphagia via handout and verbally using teach back.    HPI HPI: Per MD notes "75 year old female with history of systolic CHF (TTE September 2022 showed EF 25-30%), chronic hypoxia with 2 L oxygen at night, DM2, HTN, HLD, Parkinson's disease, left bundle branch block, comes into the hospital for generalized weakness.  She  was recently hospitalized in September 2022 for acute decompensated heart failure, was diuresed and weight on discharge was 182 pounds.  Of note, during prior hospitalization there was concern about a possible esophageal foreign body on CT angiogram, she was evaluated by speech and considered high risk for aspiration not a good candidate for barium swallow."  Pt underwent an MBS that showed mild oropharyngeal dysphagia with decreased oral coordination, small amount of retention and no aspiration/penetration - esophageal sweep not performed.  It was advised that pt follow precautions for dysphagia, GERD.  CXR now shows concerns for RUL opacity.  RN reports to this SlP that pt was coughing with medication by mouth. Per MD notes "pt c/o inability to walk, and increased swelling in her legs".  SLP swallow evaluation ordered - recommended by RN.  BSE completed and follow up indicated.      SLP Plan  Continue with current plan of care      Recommendations for follow up therapy are one component of a multi-disciplinary discharge planning process, led by the attending physician.  Recommendations may be updated based on patient status, additional functional criteria and insurance authorization.    Recommendations  Diet recommendations: Dysphagia 3 (mechanical soft);Thin liquid Liquids provided via: Cup;Straw Medication Administration: Whole meds with puree (one at a time) Supervision: Intermittent supervision to cue for compensatory strategies Compensations: Slow rate;Small sips/bites;Minimize environmental distractions Postural Changes and/or Swallow Maneuvers: Seated upright 90 degrees;Upright 30-60 min after meal                Oral Care Recommendations: Oral care BID Follow up Recommendations: Skilled Nursing facility  SLP Visit Diagnosis: Dysphagia, oral phase (R13.11);Dysphagia, unspecified (R13.10) Plan: Continue with current plan of care       GO              Kathleen Lime, MS [place] Office [phone] Pager [phone]   Macario Golds  09/03/2021, 7:16 PM

## 2021-09-03 NOTE — Progress Notes (Signed)
SLP follow up completed, full report to follow.  Pt's symptoms appear consistent with esophageal dysphagia characterized by coughing up frothy secretions after intake at times. Pt also advises that food will lodge in her esophagus and she has to give it "time to clear".  Given this report and findings of CT chest 07/22/2021 findings including "Upper thoracic esophagus intraluminal soft tissue content and air noted, images 62 through 91/5. This may represent ingested food, but difficult to exclude underlying esophageal mural lesion or abnormality. Consider follow-up esophageal evaluation such as esophagram", recommend to proceed with esophagram next date.  Md ordered, xray made aware and RN/pt/family advised.    Pt did have endoscopy (EGD) 2020 that did not show a distal narrowing - but esophagus was dilated without resistance noted. Endoscopy did not reveal source of dysphagia per notes.    SLP is concerned pt may have dysmotility and/or UES/CP dysfunction.  Recommend to assess for these potential deficits during esophagram.  Thanks.    Kathleen Lime, MS [facility] SLP Acute Rehab Services Office [phone] Pager [phone]

## 2021-09-03 NOTE — TOC Initial Note (Signed)
Transition of Care [facility]) - Initial/Assessment Note    Patient Details  Name: Stephanie [name] MRN: [id number] Date of Birth: 06/05/1946  [facility]) CM/SW Contact:    Dessa Phi, RN Phone Number: 09/03/2021, 4:29 PM  Clinical Narrative: Spoke to patient/dtr about d/c plans-both agree per PT recc to fax out for SNF-await choices.                  Expected Discharge Plan: Skilled Nursing Facility Barriers to Discharge: Continued Medical Work up   Patient Goals and CMS Choice Patient states their goals for this hospitalization and ongoing recovery are:: go home or rehab CMS Medicare.gov Compare Post Acute Care list provided to:: Patient Represenative (must comment) (donna dtr [phone]) Choice offered to / list presented to : Adult Children, Patient  Expected Discharge Plan and Services Expected Discharge Plan: [place]   Discharge Planning Services: CM Consult   Living arrangements for the past 2 months: Single Family Home                                      Prior Living Arrangements/Services Living arrangements for the past 2 months: Single Family Home Lives with:: Adult Children Patient language and need for interpreter reviewed:: Yes Do you feel safe going back to the place where you live?: Yes      Need for Family Participation in Patient Care: No (Comment) Care giver support system in place?: Yes (comment)   Criminal Activity/Legal Involvement Pertinent to Current Situation/Hospitalization: No - Comment as needed  Activities of Daily Living Home Assistive Devices/Equipment: Walker (specify type), Eyeglasses, Dentures (specify type), Oxygen, Other (Comment) (free style libre, 4 wheeled walker) ADL Screening (condition at time of admission) Patient's cognitive ability adequate to safely complete daily activities?: Yes Is the patient deaf or have difficulty hearing?: No Does the patient have difficulty seeing, even when  wearing glasses/contacts?: No Does the patient have difficulty concentrating, remembering, or making decisions?: No Patient able to express need for assistance with ADLs?: Yes Does the patient have difficulty dressing or bathing?: Yes (secondary to worsening weakness) Independently performs ADLs?: No (secondary to worsening weakness) Communication: Independent Dressing (OT): Needs assistance Is this a change from baseline?: Change from baseline, expected to last >3 days Grooming: Independent Feeding: Independent Bathing: Needs assistance Is this a change from baseline?: Change from baseline, expected to last >3 days Toileting: Needs assistance Is this a change from baseline?: Change from baseline, expected to last >3days In/Out Bed: Needs assistance Is this a change from baseline?: Change from baseline, expected to last >3 days Walks in Home: Needs assistance Is this a change from baseline?: Change from baseline, expected to last >3 days Does the patient have difficulty walking or climbing stairs?: Yes (secondary to worsening weakness) Weakness of Legs: Both Weakness of Arms/Hands: None  Permission Sought/Granted Permission sought to share information with : Case Manager Permission granted to share information with : Yes, Verbal Permission Granted  Share Information with NAME: Case manager     Permission granted to share info w Relationship: Butch Penny dtr 336 [phone]     Emotional Assessment Appearance:: Appears stated age Attitude/Demeanor/Rapport: Gracious Affect (typically observed): Accepting Orientation: : Oriented to Self, Oriented to Place, Oriented to  Time, Oriented to Situation Alcohol / Substance Use: Not Applicable Psych Involvement: No (comment)  Admission diagnosis:  Weakness [R53.1] Cellulitis of left lower  extremity [Z04.045] Generalized weakness [R53.1] Community acquired pneumonia of right upper lobe of lung [J18.9] Acute on chronic HFrEF (heart failure with  reduced ejection fraction) (HCC) [I50.23] Acute on chronic congestive heart failure, unspecified heart failure type ([place]) [I50.9] CAP (community acquired pneumonia) [J18.9] Patient Active Problem List   Diagnosis Date Noted   CAP (community acquired pneumonia) 09/01/2021   Acute on chronic HFrEF (heart failure with reduced ejection fraction) ([place]) 08/31/2021   Hypertension associated with diabetes ([place]) 08/31/2021   Acute on chronic systolic (congestive) heart failure ([place]) 07/20/2021   SOB (shortness of breath) 07/20/2021   Acute respiratory failure with hypoxia ([place]) 07/20/2021   Type 2 diabetes mellitus ([place])    Hypothyroid    Parkinson's disease ([place])    Hyperlipidemia associated with type 2 diabetes mellitus ([place])    Gout, unspecified 11/01/2020   Left bundle branch block 05/09/2019   Hyponatremia 12/31/2015   PCP:  [name], MD Pharmacy:   [facility], [place] [address] Phone: [phone] Fax: [phone]     Social Determinants of Health (SDOH) Interventions    Readmission Risk Interventions No flowsheet data found.

## 2021-09-03 NOTE — Progress Notes (Signed)
PROGRESS NOTE    Stephanie [name]  YTK:[id number] DOB: [birth year] DOA: 08/31/2021 PCP: [name], MD    Brief Narrative:  75 year old female with history of systolic CHF (TTE September 2022 showed EF 25-30%), chronic hypoxia with 2 L oxygen at night, DM2, HTN, HLD, Parkinson's disease, left bundle branch block, comes into the hospital for generalized weakness.  She was recently hospitalized in September 2022 for acute decompensated heart failure, was diuresed and weight on discharge was 182 pounds.  Of note, during prior hospitalization there was concern about a possible esophageal foreign body on CT angiogram, she was evaluated by speech and considered high risk for aspiration not a good candidate for barium esophagogram.  She was able to tolerate p.o. without issues.  Complains of weakness currently, inability to walk, and increased swelling in her legs  Assessment & Plan:   Principal Problem:   Acute on chronic HFrEF (heart failure with reduced ejection fraction) (HCC) Active Problems:   Type 2 diabetes mellitus (HCC)   Hypothyroid   Parkinson's disease (HCC)   Hyperlipidemia associated with type 2 diabetes mellitus ([place])   Hypertension associated with diabetes ([place])   CAP (community acquired pneumonia)  Principal Problem Acute on chronic systolic CHF-came into the hospital with worsening dyspnea, increased edema and elevated BNP.  She was started on furosemide 40 mg twice daily, continue.  -Wt down to 149lbs today, down 8lbs since admit -Continue home medication with losartan, metoprolol -Most recent 2D echo done in September 2022 when she was admitted with fluid overload at [facility] showed an EF of 25-30%, severely decreased LV function, global hypokinesis.   -Appear stable improved from a volume standpoint, tolerating Lasix -Patient and family reports that patient is followed closely by cardiology as outpatient    Active Problems Lobar pneumonia, right upper  lobe-chest x-ray concerning for right upper lobe pneumonia.  She definitely is at risk for aspiration.  She has been placed on antibiotics with ceftriaxone and azithromycin, continue current antibiotics for now. -Per speech pathologist, recommendation for follow-up esophagram   Acute metabolic encephalopathy-she is confused this morning, possibly in the setting of lobar pneumonia/lower extremity cellulitis -Mentation improved, patient conversing appropriately   Lower extremity cellulitis-there is a degree of venous stasis changes however the rash looks bright red, somewhat tender to palpation and cannot exclude a degree of cellulitis.  She is covered with ceftriaxone, improved   Generalized weakness/deconditioning-normally ambulates with a walker but unable to do so in the last few days.  PT/OT consulted, thus far recommendations for skilled nursing facility recommended, TOC following   History of dysphagia-speech consulted, appreciate recommendations -Per above, esophagram recommended   Essential hypertension-continue home medications as below  Hypothyroidism-continue Synthroid as tolerated   Parkinson's disease-continue Sinemet as tolerated -Resting tremor noted on exam   Chest pain-EKG with LBBB however this is known to be chronic.   -Serial troponins reviewed, serially negative   LBBB-noted   DM2 -continue sliding scale as needed  Anxiety -Patient reports being afraid to sleep at night, citing fear that SNF may not attend to her needs in a timely manner, leading up to periods of panic.  Patient tearful when describing these feelings. -Patient is agreeable to discussing with behavioral health, have consulted -For now, we will give trial of low-dose Xanax as needed twice daily  DVT prophylaxis: Heparin subq Code Status: Full Family Communication: Pt in room, family is at bedside  Status is: Inpatient  Remains inpatient appropriate because: severity of ill ness  Consultants:  Psychiatry  Procedures:    Antimicrobials: Anti-infectives (From admission, onward)    Start     Dose/Rate Route Frequency Ordered Stop   09/03/21 1000  azithromycin (ZITHROMAX) tablet 500 mg        500 mg Oral Daily 09/02/21 0905     09/01/21 0100  cefTRIAXone (ROCEPHIN) 1 g in sodium chloride 0.9 % 100 mL IVPB        1 g 200 mL/hr over 30 Minutes Intravenous Every 24 hours 09/01/21 0033     09/01/21 0100  azithromycin (ZITHROMAX) 500 mg in sodium chloride 0.9 % 250 mL IVPB  Status:  Discontinued        500 mg 250 mL/hr over 60 Minutes Intravenous Every 24 hours 09/01/21 0033 09/02/21 0905   08/31/21 2345  cefTRIAXone (ROCEPHIN) 1 g in sodium chloride 0.9 % 100 mL IVPB  Status:  Discontinued        1 g 200 mL/hr over 30 Minutes Intravenous  Once 08/31/21 2332 09/01/21 0033   08/31/21 2345  azithromycin (ZITHROMAX) 500 mg in sodium chloride 0.9 % 250 mL IVPB  Status:  Discontinued        500 mg 250 mL/hr over 60 Minutes Intravenous  Once 08/31/21 2332 09/01/21 0033       Subjective: Initially reports feeling well, later admits to feeling anxious, lack of sleep over fear of not being tended to at night  Objective: Vitals:   09/02/21 1252 09/02/21 2058 09/03/21 0550 09/03/21 1219  BP: 126/84 117/86 115/83 (!) 129/99  Pulse: 71 71 67 76  Resp: 16 18 20 15   Temp: (!) 97.5 F (36.4 C) 98 F (36.7 C) 98.3 F (36.8 C) 98.4 F (36.9 C)  TempSrc: Oral  Oral Oral  SpO2: 97% 93% 91% 95%  Weight:   70 kg   Height:        Intake/Output Summary (Last 24 hours) at 09/03/2021 1743 Last data filed at 09/03/2021 1224 Gross per 24 hour  Intake 462.62 ml  Output --  Net 462.62 ml    Filed Weights   09/01/21 1531 09/02/21 0548 09/03/21 0550  Weight: 71.3 kg 67.7 kg 70 kg    Examination: General exam: Conversant, in no acute distress Respiratory system: normal chest rise, clear, no audible wheezing Cardiovascular system: regular rhythm, s1-s2 Gastrointestinal  system: Nondistended, nontender, pos BS Central nervous system: No seizures, no tremors Extremities: No cyanosis, no joint deformities Skin: No rashes, no pallor Psychiatry: Tearful, no hallucinations  Data Reviewed: I have personally reviewed following labs and imaging studies  CBC: Recent Labs  Lab 08/31/21 2223 09/01/21 0426 09/02/21 0430 09/03/21 0424  WBC 6.3 5.7 5.6 6.1  NEUTROABS 4.6  --   --   --   HGB 11.9* 11.1* 11.2* 11.0*  HCT 38.2 36.5 36.3 35.1*  MCV 84.9 86.1 84.4 83.6  PLT 175 162 150 295    Basic Metabolic Panel: Recent Labs  Lab 08/31/21 2223 09/01/21 0426 09/02/21 0430 09/03/21 0424  NA 138 139 137 138  K 3.9 3.7 3.1* 3.8  CL 105 106 100 102  CO2 23 25 26 27   GLUCOSE 167* 106* 101* 130*  BUN 28* 27* 22 20  CREATININE 0.78 0.83 0.62 0.66  CALCIUM 8.9 8.7* 8.3* 8.3*  MG  --  1.5* 1.5*  --     GFR: Estimated Creatinine Clearance: 53 mL/min (by C-G formula based on SCr of 0.66 mg/dL). Liver Function Tests: Recent Labs  Lab 08/31/21 2223 09/02/21  0430 09/03/21 0424  AST 15 11* 11*  ALT 14 <5 <5  ALKPHOS 97 89 85  BILITOT 0.8 0.9 0.6  PROT 7.4 6.9 6.7  ALBUMIN 3.4* 3.2* 3.1*    No results for input(s): LIPASE, AMYLASE in the last 168 hours. No results for input(s): AMMONIA in the last 168 hours. Coagulation Profile: No results for input(s): INR, PROTIME in the last 168 hours. Cardiac Enzymes: Recent Labs  Lab 09/01/21 0426  CKTOTAL 53    BNP (last 3 results) No results for input(s): PROBNP in the last 8760 hours. HbA1C: No results for input(s): HGBA1C in the last 72 hours. CBG: Recent Labs  Lab 09/02/21 2110 09/03/21 0737 09/03/21 1214 09/03/21 1708 09/03/21 1730  GLUCAP 166* 113* 219* 63* 168*    Lipid Profile: No results for input(s): CHOL, HDL, LDLCALC, TRIG, CHOLHDL, LDLDIRECT in the last 72 hours. Thyroid Function Tests: No results for input(s): TSH, T4TOTAL, FREET4, T3FREE, THYROIDAB in the last 72 hours. Anemia  Panel: No results for input(s): VITAMINB12, FOLATE, FERRITIN, TIBC, IRON, RETICCTPCT in the last 72 hours. Sepsis Labs: Recent Labs  Lab 08/31/21 2330  PROCALCITON <0.10     Recent Results (from the past 240 hour(s))  Resp Panel by RT-PCR (Flu A&B, Covid) Nasopharyngeal Swab     Status: None   Collection Time: 08/31/21 10:25 PM   Specimen: Nasopharyngeal Swab; Nasopharyngeal(NP) swabs in vial transport medium  Result Value Ref Range Status   SARS Coronavirus 2 by RT PCR NEGATIVE NEGATIVE Final    Comment: (NOTE) SARS-CoV-2 target nucleic acids are NOT DETECTED.  The SARS-CoV-2 RNA is generally detectable in upper respiratory specimens during the acute phase of infection. The lowest concentration of SARS-CoV-2 viral copies this assay can detect is 138 copies/mL. A negative result does not preclude SARS-Cov-2 infection and should not be used as the sole basis for treatment or other patient management decisions. A negative result may occur with  improper specimen collection/handling, submission of specimen other than nasopharyngeal swab, presence of viral mutation(s) within the areas targeted by this assay, and inadequate number of viral copies(<138 copies/mL). A negative result must be combined with clinical observations, patient history, and epidemiological information. The expected result is Negative.  Fact Sheet for Patients:  EntrepreneurPulse.com.au  Fact Sheet for Healthcare Providers:  IncredibleEmployment.be  This test is no t yet approved or cleared by the [place] FDA and  has been authorized for detection and/or diagnosis of SARS-CoV-2 by FDA under an Emergency Use Authorization (EUA). This EUA will remain  in effect (meaning this test can be used) for the duration of the COVID-19 declaration under Section 564(b)(1) of the Act, 21 U.S.C.section 360bbb-3(b)(1), unless the authorization is terminated  or revoked sooner.        Influenza A by PCR NEGATIVE NEGATIVE Final   Influenza B by PCR NEGATIVE NEGATIVE Final    Comment: (NOTE) The Xpert Xpress SARS-CoV-2/FLU/RSV plus assay is intended as an aid in the diagnosis of influenza from Nasopharyngeal swab specimens and should not be used as a sole basis for treatment. Nasal washings and aspirates are unacceptable for Xpert Xpress SARS-CoV-2/FLU/RSV testing.  Fact Sheet for Patients: EntrepreneurPulse.com.au  Fact Sheet for Healthcare Providers: IncredibleEmployment.be  This test is not yet approved or cleared by the [place] FDA and has been authorized for detection and/or diagnosis of SARS-CoV-2 by FDA under an Emergency Use Authorization (EUA). This EUA will remain in effect (meaning this test can be used) for the duration of the COVID-19  declaration under Section 564(b)(1) of the Act, 21 U.S.C. section 360bbb-3(b)(1), unless the authorization is terminated or revoked.  Performed at [facility], [place] [address], [place], [place] [address]   Urine Culture     Status: Abnormal   Collection Time: 08/31/21 11:33 PM   Specimen: Urine, Clean Catch  Result Value Ref Range Status   Specimen Description   Final    URINE, CLEAN CATCH Performed at [facility], [place] [address], [place], [place] [address]    Special Requests   Final    NONE Performed at [facility], [place] [address], [place], [place] [address]    Culture MULTIPLE SPECIES PRESENT, SUGGEST RECOLLECTION (A)  Final   Report Status 09/01/2021 FINAL  Final      Radiology Studies: No results found.  Scheduled Meds:  allopurinol  100 mg Oral Daily   azithromycin  500 mg Oral Daily   carbidopa-levodopa  1 tablet Oral QHS   carbidopa-levodopa  2.5 tablet Oral QID   furosemide  40 mg Intravenous BID   gabapentin  300 mg Oral QHS   heparin  5,000 Units Subcutaneous Q8H   insulin aspart  0-9 Units  Subcutaneous TID WC   levothyroxine  75 mcg Oral QAC breakfast   losartan  50 mg Oral Daily   metoprolol succinate  50 mg Oral Daily   QUEtiapine  25 mg Oral QHS   sodium chloride flush  3 mL Intravenous Q12H   Continuous Infusions:  sodium chloride Stopped (09/02/21 0417)   cefTRIAXone (ROCEPHIN)  IV Stopped (09/03/21 0103)     LOS: 2 days   Marylu Lund, MD [facility] Pager On Amion  If 7PM-7AM, please contact night-coverage 09/03/2021, 5:43 PM

## 2021-09-03 NOTE — Progress Notes (Signed)
Hypoglycemic Event  CBG: 63  Treatment: 4 oz juice/soda  Symptoms: Nervous/irritable  Follow-up CBG: Time:1730 CBG Result:168  Possible Reasons for Event: Unknown  Comments/MD notified:Dr. Juanda Bond, Francetta Found

## 2021-09-04 ENCOUNTER — Inpatient Hospital Stay (HOSPITAL_COMMUNITY): Payer: Medicare Other

## 2021-09-04 ENCOUNTER — Inpatient Hospital Stay (HOSPITAL_COMMUNITY)
Admit: 2021-09-04 | Discharge: 2021-09-04 | Disposition: A | Discharge status: Home / Self Care | Payer: Medicare Other | Attending: Internal Medicine | Admitting: Internal Medicine

## 2021-09-04 DIAGNOSIS — I5023 Acute on chronic systolic (congestive) heart failure: Secondary | ICD-10-CM

## 2021-09-04 DIAGNOSIS — R4582 Worries: Secondary | ICD-10-CM | POA: Diagnosis not present

## 2021-09-04 DIAGNOSIS — F515 Nightmare disorder: Secondary | ICD-10-CM

## 2021-09-04 LAB — MAGNESIUM: Magnesium: 1.6 mg/dL — ABNORMAL LOW (ref 1.7–2.4)

## 2021-09-04 LAB — COMPREHENSIVE METABOLIC PANEL
ALT: <5 U/L (ref 0–44)
AST: 12 U/L — ABNORMAL LOW (ref 15–41)
Albumin: 3.2 g/dL — ABNORMAL LOW (ref 3.5–5.0)
Alkaline Phosphatase: 81 U/L (ref 38–126)
Anion gap: 9 (ref 5–15)
BUN: 26 mg/dL — ABNORMAL HIGH (ref 8–23)
CO2: 28 mmol/L (ref 22–32)
Calcium: 8.3 mg/dL — ABNORMAL LOW (ref 8.9–10.3)
Chloride: 100 mmol/L (ref 98–111)
Creatinine, Ser: 0.92 mg/dL (ref 0.44–1.00)
GFR, Estimated: >60 mL/min (ref >60)
Glucose, Bld: 84 mg/dL (ref 70–99)
Potassium: 4.1 mmol/L (ref 3.5–5.1)
Sodium: 137 mmol/L (ref 135–145)
Total Bilirubin: 0.9 mg/dL (ref 0.3–1.2)
Total Protein: 6.6 g/dL (ref 6.5–8.1)

## 2021-09-04 LAB — GLUCOSE, CAPILLARY
Glucose-Capillary: 97 mg/dL (ref 70–99)
Glucose-Capillary: 191 mg/dL — ABNORMAL HIGH (ref 70–99)
Glucose-Capillary: 204 mg/dL — ABNORMAL HIGH (ref 70–99)
Glucose-Capillary: 100 mg/dL — ABNORMAL HIGH (ref 70–99)

## 2021-09-04 MED ORDER — SERTRALINE HCL 25 MG PO TABS
25.0000 mg | ORAL_TABLET | Freq: Every day | ORAL | Status: DC
  Administered 2021-09-04 – 2021-09-07 (×4): 25 mg via ORAL
  Filled 2021-09-04 (×4): qty 1

## 2021-09-04 MED ORDER — MAGNESIUM SULFATE 4 GM/100ML IV SOLN
4.0000 g | Freq: Once | INTRAVENOUS | Status: AC
Start: 2021-09-04 — End: 2021-09-04
  Administered 2021-09-04: 4 g via INTRAVENOUS
  Filled 2021-09-04: qty 100

## 2021-09-04 MED ORDER — MORPHINE SULFATE (PF) 2 MG/ML IV SOLN
1.0000 mg | INTRAVENOUS | Status: DC | PRN
  Administered 2021-09-04: 1 mg via INTRAVENOUS
  Filled 2021-09-04: qty 1

## 2021-09-04 NOTE — Consult Note (Signed)
[facility] New Psychiatric Evaluation   Service Date: September 04, 2021 LOS:  LOS: 3 days    Assessment  Stephanie [name] is a 75 y.o. female admitted medically for 08/31/2021  9:15 PM for weakness, inability to walk, and swelling in her legs. She carries the psychiatric diagnoses of possible prior anxiety (formerly on prozac, off x many  years) and has a past medical history of  diabetes, hypothyroidism, parkinson's disease, hyperlipdemia, acute on chronic HFrEF, LBBB and hypertension. Has had hyponatremia in past but not this admission.   Psychiatry was consulted for worsening nightmares and PTSD from recent hospital stay by Stephanie Lund, MD.    Her current presentation of intrusive thoughts, excessive worry, nightmares, and sleep avoidance is most consistent with PTSD (inciting event >1 month ago). As intrusive thoughts/excessive worry/depressive sx are largely centered around prior hospitalizaiton, will hold off on GAD or MDD diagnosis.  Recent outpatient psychotropic medications include haloperidol (10/12-10/17) and quetiapine. Historically she has had a poor response to quetiapine (made stiffness worse) and a fair response to quetiapine (resolution of hallucinations). Movements are overall a little worse since quetiapine started, however amantadine also stopped during the past 4 weeks due to concern . She was  compliant with medications prior to admission as evidenced by pt and daughter report.Please see plan below for detailed recommendations. Spent significant part of interview on psychoeducation of pt and daughter.   Diagnoses:  Active Hospital problems: Principal Problem:   Acute on chronic HFrEF (heart failure with reduced ejection fraction) (HCC) Active Problems:   Type 2 diabetes mellitus (HCC)   Hypothyroid   Parkinson's disease ([place])   Hyperlipidemia associated with type 2 diabetes mellitus ([place])   Hypertension associated with diabetes ([place])   CAP (community  acquired pneumonia)    Problems edited/added by me: No problems updated.  Plan  ## Safety and Observation Level:  - Based on my clinical evaluation, I estimate the patient to be at low risk of self harm in the current setting - At this time, we recommend a routine level of observation. This decision is based on my review of the chart including patient's history and current presentation, interview of the patient, mental status examination, and consideration of suicide risk including evaluating suicidal ideation, plan, intent, suicidal or self-harm behaviors, risk factors, and protective factors. This judgment is based on our ability to directly address suicide risk, implement suicide prevention strategies and develop a safety plan while the patient is in the clinical setting. Please contact our team if there is a concern that risk level has changed.   ## Medications:  -- s sertraline 25 mg qd -- recheck bmp in ~4-5 days or at next PCP for hyponatremia - continue quetiapine 25 mg qhs -- would not dc with xanax   ## Medical Decision Making Capacity:  Not formally assessed  ##Legal Status voluntary  Thank you for this consult request. Recommendations have been communicated to the primary team.  We will continue to follow at this time.   [place] A Perrie Ragin    NEW history  Relevant Aspects of Hospital Course:  Admitted on 08/31/2021 for weakness, malaise, and decline in functional status .  Patient Report:  Patient is alert and fully oriented and anticipates many questions. Has good fund of knowledge. Able to do DOWB with no errors, is internally distracted by pain to R hand. Portions of history supplied by daughter who she consented to have stay in the room  Patient has been  having nightmares and has been "almost delusional" for the past ~3 weeks after a traumatic hospital stay. Having excessive worry, intrusive thoughts, etc. Is much worse now that she has been rehospitalized -  worst in AM when waking up. Separately, has also started to have hallucinations. These are of people that she doesn't know, are generally non-threatening, and she reports and daughter confirms good reality differentiation. Occur mostly in the evening. Was started on haldol which made stiffness worse, then transferred to quetiapine. Amantadine also stopped during same time period, dyskinesias worse since it stopped. Discussed opposing mechanisms of carbidopa/levodopa and antipsychotics, and pt's relatively low dose of quetiapine at this time. Discussed possibility of asking PCP or neuro to adjust sinemet dosing or cautionsly reintroduce amantadine, balancing hallucinations and parkinsonian symptoms. Discussed r/b/se of meds for PTSD and nightmares - pt and daughter consented to sertraline.   ROS:  Weakness, pain in R hand  Depressoin: decreased sleep, anhedonia (mild), decreased energy, decreased appetite (more medical - difficulty swallowing) PTSD - nightmares, avoidance, hypervigilance, intrusive thoughts Psychosis - visual hallucinations (a couple of weeks ago, not now), no thought insertion/projection or paranoia  Psychiatric History:  Information collected from pt, daughter  On prozac many years ago for "nerves"  Family psych history: none  Medical History: Past Medical History:  Diagnosis Date   Anemia    Arthritis    CHF (congestive heart failure) (HCC)    Chronic back pain    Diabetes mellitus without complication (HCC)    Dysrhythmia    GERD (gastroesophageal reflux disease)    Hyperlipidemia    Hypertension    Hypothyroid    Multilevel degenerative disc disease    Parkinson's disease ([place])    Sleep apnea    Syncope    Tremors of nervous system    Wears dentures    full upper and lower    Surgical History: Past Surgical History:  Procedure Laterality Date   ABDOMINAL HYSTERECTOMY     BACK SURGERY  09/16   CARDIAC CATHETERIZATION     CATARACT EXTRACTION W/PHACO  Right 11/24/2015   Procedure: CATARACT EXTRACTION PHACO AND INTRAOCULAR LENS PLACEMENT ([place]);  Surgeon: Estill Cotta, MD;  Location: [facility];  Service: Ophthalmology;  Laterality: Right;  [place] 01:14 AP% 25.5 CDE 30.69 fluid pack lot # [id number]   CATARACT EXTRACTION W/PHACO Left 01/06/2021   Procedure: CATARACT EXTRACTION PHACO AND INTRAOCULAR LENS PLACEMENT ([facility]) LEFT DIABETIC 6.98 00:40.1;  Surgeon: Birder Robson, MD;  Location: [facility];  Service: Ophthalmology;  Laterality: Left;  Diabetic - oral meds   COLONOSCOPY     COLONOSCOPY WITH PROPOFOL N/A 07/24/2019   Procedure: COLONOSCOPY WITH PROPOFOL;  Surgeon: Toledo, Benay Pike, MD;  Location: [facility];  Service: Gastroenterology;  Laterality: N/A;   ESOPHAGOGASTRODUODENOSCOPY N/A 07/24/2019   Procedure: ESOPHAGOGASTRODUODENOSCOPY ([facility]);  Surgeon: Toledo, Benay Pike, MD;  Location: [facility];  Service: Gastroenterology;  Laterality: N/A;   OOPHORECTOMY      Medications:   Current Facility-Administered Medications:    0.9 %  sodium chloride infusion, , Intravenous, PRN, Caren Griffins, MD, Stopped at 09/02/21 0417   acetaminophen (TYLENOL) tablet 650 mg, 650 mg, Oral, Q6H PRN, 650 mg at 09/03/21 0559 **OR** acetaminophen (TYLENOL) suppository 650 mg, 650 mg, Rectal, Q6H PRN, Zada Finders R, MD   allopurinol (ZYLOPRIM) tablet 100 mg, 100 mg, Oral, Daily, Donne Hazel, MD, 100 mg at 09/04/21 1029   ALPRAZolam (XANAX) tablet 0.25 mg, 0.25 mg, Oral, BID PRN, Donne Hazel, MD,  0.25 mg at 09/03/21 2140   azithromycin (ZITHROMAX) tablet 500 mg, 500 mg, Oral, Daily, Eudelia Bunch, RPH, 500 mg at 09/04/21 1029   carbidopa-levodopa (SINEMET CR) 50-200 MG per tablet controlled release 1 tablet, 1 tablet, Oral, QHS, Zada Finders R, MD, 1 tablet at 09/03/21 2141   carbidopa-levodopa (SINEMET IR) 25-100 MG per tablet immediate release 2.5 tablet, 2.5 tablet, Oral, QID, Zada Finders R, MD, 2.5 tablet at 09/04/21 1025    cefTRIAXone (ROCEPHIN) 1 g in sodium chloride 0.9 % 100 mL IVPB, 1 g, Intravenous, Q24H, Lenore Cordia, MD, Stopped at 09/04/21 0107   furosemide (LASIX) injection 40 mg, 40 mg, Intravenous, BID, Zada Finders R, MD, 40 mg at 09/04/21 1028   gabapentin (NEURONTIN) capsule 300 mg, 300 mg, Oral, QHS, Donne Hazel, MD, 300 mg at 09/03/21 2140   heparin injection 5,000 Units, 5,000 Units, Subcutaneous, Q8H, Zada Finders R, MD, 5,000 Units at 09/03/21 2141   insulin aspart (novoLOG) injection 0-9 Units, 0-9 Units, Subcutaneous, TID WC, Lenore Cordia, MD, 3 Units at 09/03/21 1247   levothyroxine (SYNTHROID) tablet 75 mcg, 75 mcg, Oral, QAC breakfast, Zada Finders R, MD, 75 mcg at 09/04/21 1027   losartan (COZAAR) tablet 50 mg, 50 mg, Oral, Daily, Zada Finders R, MD, 50 mg at 09/04/21 1028   metoprolol succinate (TOPROL-XL) 24 hr tablet 50 mg, 50 mg, Oral, Daily, Zada Finders R, MD, 50 mg at 09/04/21 1025   ondansetron (ZOFRAN) tablet 4 mg, 4 mg, Oral, Q6H PRN, 4 mg at 09/03/21 2141 **OR** ondansetron (ZOFRAN) injection 4 mg, 4 mg, Intravenous, Q6H PRN, Lenore Cordia, MD   oxyCODONE-acetaminophen (PERCOCET/ROXICET) 5-325 MG per tablet 1 tablet, 1 tablet, Oral, Q6H PRN, Donne Hazel, MD, 1 tablet at 09/04/21 1031   QUEtiapine (SEROQUEL) tablet 25 mg, 25 mg, Oral, QHS, Gherghe, Costin M, MD, 25 mg at 09/03/21 2140   senna-docusate (Senokot-S) tablet 1 tablet, 1 tablet, Oral, QHS PRN, Posey Pronto, Vishal R, MD   sodium chloride flush (NS) 0.9 % injection 3 mL, 3 mL, Intravenous, Q12H, Patel, Vishal R, MD, 3 mL at 09/04/21 1028   traMADol (ULTRAM) tablet 25 mg, 25 mg, Oral, Q12H PRN, Caren Griffins, MD, 25 mg at 09/03/21 1027  Allergies: No Known Allergies  Social History:  Lives with daughter, 2 kids, 1 grandkid No access to weapons Spiritual 9th grade education, worked for [employer] until parkinson dx No legal hx  Tobacco use: no Alcohol use: no Drug use: no  Family History:  The  patient's family history includes Breast cancer in her paternal aunt.    Objective  Vital signs:  Temp:  [97.6 F (36.4 C)-98.4 F (36.9 C)] 98.4 F (36.9 C) (11/04 0448) Pulse Rate:  [67-79] 79 (11/04 0448) Resp:  [18-21] 18 (11/03 2056) BP: (131-159)/(72-118) 138/118 (11/04 0448) SpO2:  [85 %-96 %] 85 % (11/04 0448)  Physical Exam: Gen: Alert, appears to be in pain (wincing when moves wrist) Neuro: no cogwheeling or rigidity, constant dyskinesias Psych: alert, oriented, euthymic Pulm: no increased wob  Mental Status Exam: Appearance: Appears stated age, well groomed  Attitude:  Hopeful, cooperative  Behavior/Psychomotor: Constant dyskinesia, constant movement, can briefly suppress (<1sec)  Speech/Language:  Unaffected, clear and coherent. Needs phrases repeated (hard of hearing)  Mood: "I'm ready to be a [place] pig!" (Hopeful, euthymic)  Affect: congruent  Thought process: Clear, coherent, goal directed  Thought content:   Devoid of SI/HI  Perceptual disturbances:  None at time of interview  Attention: good  Concentration: good  Orientation: full  Memory: good  Fund of knowledge:  good  Insight:   good  Judgment:  good  Impulse Control: good

## 2021-09-04 NOTE — TOC Progression Note (Signed)
Transition of Care [facility]) - Progression Note    Patient Details  Name: Stephanie [name] MRN: [id number] Date of Birth: 1946-03-09  [facility] CM/SW Contact  Benjiman Sedgwick, Juliann Pulse, RN Phone Number: 09/04/2021, 3:15 PM  Clinical Narrative:  [facility] chosen for SNF-await medical stability, & covid within 48hrs of d/c.     Expected Discharge Plan: [place] Barriers to Discharge: Continued Medical Work up  Expected Discharge Plan and Services Expected Discharge Plan: [place]   Discharge Planning Services: CM Consult   Living arrangements for the past 2 months: Single Family Home                                       Social Determinants of Health (SDOH) Interventions    Readmission Risk Interventions Readmission Risk Prevention Plan 09/03/2021  Transportation Screening Complete  PCP or Specialist Appt within 3-5 Days Complete  HRI or [place] Complete  Social Work Consult for [place] Planning/Counseling Complete  Palliative Care Screening Not Applicable  Medication Review [employer]) Complete  Some recent data might be hidden

## 2021-09-04 NOTE — Progress Notes (Signed)
PROGRESS NOTE    Stephanie [name]  OIN:[id number] DOB: Feb 09, 1946 DOA: 08/31/2021 PCP: [name], MD    Brief Narrative:  75 year old female with history of systolic CHF (TTE September 2022 showed EF 25-30%), chronic hypoxia with 2 L oxygen at night, DM2, HTN, HLD, Parkinson's disease, left bundle branch block, comes into the hospital for generalized weakness.  She was recently hospitalized in September 2022 for acute decompensated heart failure, was diuresed and weight on discharge was 182 pounds.  Of note, during prior hospitalization there was concern about a possible esophageal foreign body on CT angiogram, she was evaluated by speech and considered high risk for aspiration not a good candidate for barium esophagogram.  She was able to tolerate p.o. without issues.  Complains of weakness currently, inability to walk, and increased swelling in her legs  Assessment & Plan:   Principal Problem:   Acute on chronic HFrEF (heart failure with reduced ejection fraction) (HCC) Active Problems:   Type 2 diabetes mellitus (HCC)   Hypothyroid   Parkinson's disease (HCC)   Hyperlipidemia associated with type 2 diabetes mellitus ([place])   Hypertension associated with diabetes ([place])   CAP (community acquired pneumonia)  Principal Problem Acute on chronic systolic CHF-came into the hospital with worsening dyspnea, increased edema and elevated BNP.  She was started on furosemide 40 mg twice daily, continue.  -Wt down to 149lbs today, down 8lbs since admit -Continue home medication with losartan, metoprolol -Most recent 2D echo done in September 2022 when she was admitted with fluid overload at [facility] showed an EF of 25-30%, severely decreased LV function, global hypokinesis.   -Appear stable improved from a volume standpoint, continues to tolerate Lasix -Patient and family reports that patient is followed closely by cardiology as outpatient    Active Problems Lobar pneumonia, right  upper lobe-chest x-ray concerning for right upper lobe pneumonia.  She definitely is at risk for aspiration.  She has been placed on antibiotics with ceftriaxone and azithromycin, continue current antibiotics for now. -Per speech pathologist, esophagogram was recommended.  Reviewed.  Patient noted to have a persistent narrowing at the gastroesophageal junction which could reflect stricture or mass. -Have consulted gastroenterology for assistance   Acute metabolic encephalopathy- -Seems to be much improved, conversing appropriately   Lower extremity cellulitis-there is a degree of venous stasis changes however the rash looks bright red, somewhat tender to palpation and cannot exclude a degree of cellulitis.  She is covered with ceftriaxone, appears to be improved   Generalized weakness/deconditioning-normally ambulates with a walker but unable to do so in the last few days.  PT/OT consulted, thus far recommendations for skilled nursing facility recommended, TOC following   History of dysphagia-speech consulted, appreciate recommendations -Per above, esophagram recommended   Essential hypertension-continue home medications as below  Hypothyroidism-continue Synthroid as tolerated   Parkinson's disease-continue Sinemet as tolerated -Resting tremor noted on exam   Chest pain-EKG with LBBB however this is known to be chronic.   -Serial troponins reviewed, serially negative   LBBB-noted   DM2 -continue sliding scale as needed  Anxiety -Patient reports being afraid to sleep at night, citing fear that SNF may not attend to her needs in a timely manner, leading up to periods of panic.  Patient tearful when describing these feelings. -Appreciate assistance by psychiatry.  Recommendations for quetiapine and consideration for input by neurology in light of patient's history of Parkinson's disease.  DVT prophylaxis: Heparin subq Code Status: Full Family Communication: Pt in room, family  is at  bedside  Status is: Inpatient  Remains inpatient appropriate because: severity of ill ness   Consultants:  Psychiatry  Procedures:    Antimicrobials: Anti-infectives (From admission, onward)    Start     Dose/Rate Route Frequency Ordered Stop   09/03/21 1000  azithromycin (ZITHROMAX) tablet 500 mg        500 mg Oral Daily 09/02/21 0905     09/01/21 0100  cefTRIAXone (ROCEPHIN) 1 g in sodium chloride 0.9 % 100 mL IVPB        1 g 200 mL/hr over 30 Minutes Intravenous Every 24 hours 09/01/21 0033     09/01/21 0100  azithromycin (ZITHROMAX) 500 mg in sodium chloride 0.9 % 250 mL IVPB  Status:  Discontinued        500 mg 250 mL/hr over 60 Minutes Intravenous Every 24 hours 09/01/21 0033 09/02/21 0905   08/31/21 2345  cefTRIAXone (ROCEPHIN) 1 g in sodium chloride 0.9 % 100 mL IVPB  Status:  Discontinued        1 g 200 mL/hr over 30 Minutes Intravenous  Once 08/31/21 2332 09/01/21 0033   08/31/21 2345  azithromycin (ZITHROMAX) 500 mg in sodium chloride 0.9 % 250 mL IVPB  Status:  Discontinued        500 mg 250 mL/hr over 60 Minutes Intravenous  Once 08/31/21 2332 09/01/21 0033       Subjective: Reports sleeping better overnight  Objective: Vitals:   09/03/21 1848 09/03/21 2056 09/04/21 0448 09/04/21 1341  BP: (!) 159/72 131/89 (!) 138/118 (!) 114/102  Pulse: 74 67 79 (!) 58  Resp: (!) 21 18  18   Temp: 98.1 F (36.7 C) 97.6 F (36.4 C) 98.4 F (36.9 C) 97.6 F (36.4 C)  TempSrc: Oral Oral Axillary Oral  SpO2: 96% 94% (!) 85% 90%  Weight:    69.6 kg  Height:        Intake/Output Summary (Last 24 hours) at 09/04/2021 1650 Last data filed at 09/04/2021 1400 Gross per 24 hour  Intake 320 ml  Output --  Net 320 ml    Filed Weights   09/02/21 0548 09/03/21 0550 09/04/21 1341  Weight: 67.7 kg 70 kg 69.6 kg    Examination: General exam: Awake, laying in bed, in nad Respiratory system: Normal respiratory effort, no wheezing Cardiovascular system: regular rate, s1,  s2 Gastrointestinal system: Soft, nondistended, positive BS Central nervous system: CN2-12 grossly intact, strength intact Extremities: Perfused, no clubbing, BLE edema Skin: Normal skin turgor, no notable skin lesions seen Psychiatry: Mood normal // no visual hallucinations   Data Reviewed: I have personally reviewed following labs and imaging studies  CBC: Recent Labs  Lab 08/31/21 2223 09/01/21 0426 09/02/21 0430 09/03/21 0424  WBC 6.3 5.7 5.6 6.1  NEUTROABS 4.6  --   --   --   HGB 11.9* 11.1* 11.2* 11.0*  HCT 38.2 36.5 36.3 35.1*  MCV 84.9 86.1 84.4 83.6  PLT 175 162 150 332    Basic Metabolic Panel: Recent Labs  Lab 08/31/21 2223 09/01/21 0426 09/02/21 0430 09/03/21 0424 09/04/21 0418  NA 138 139 137 138 137  K 3.9 3.7 3.1* 3.8 4.1  CL 105 106 100 102 100  CO2 23 25 26 27 28   GLUCOSE 167* 106* 101* 130* 84  BUN 28* 27* 22 20 26*  CREATININE 0.78 0.83 0.62 0.66 0.92  CALCIUM 8.9 8.7* 8.3* 8.3* 8.3*  MG  --  1.5* 1.5*  --  1.6*  GFR: Estimated Creatinine Clearance: 46 mL/min (by C-G formula based on SCr of 0.92 mg/dL). Liver Function Tests: Recent Labs  Lab 08/31/21 2223 09/02/21 0430 09/03/21 0424 09/04/21 0418  AST 15 11* 11* 12*  ALT 14 <5 <5 <5  ALKPHOS 97 89 85 81  BILITOT 0.8 0.9 0.6 0.9  PROT 7.4 6.9 6.7 6.6  ALBUMIN 3.4* 3.2* 3.1* 3.2*    No results for input(s): LIPASE, AMYLASE in the last 168 hours. No results for input(s): AMMONIA in the last 168 hours. Coagulation Profile: No results for input(s): INR, PROTIME in the last 168 hours. Cardiac Enzymes: Recent Labs  Lab 09/01/21 0426  CKTOTAL 53    BNP (last 3 results) No results for input(s): PROBNP in the last 8760 hours. HbA1C: No results for input(s): HGBA1C in the last 72 hours. CBG: Recent Labs  Lab 09/03/21 1852 09/03/21 2000 09/04/21 0748 09/04/21 1131 09/04/21 1647  GLUCAP 146* 141* 97 191* 204*    Lipid Profile: No results for input(s): CHOL, HDL, LDLCALC,  TRIG, CHOLHDL, LDLDIRECT in the last 72 hours. Thyroid Function Tests: No results for input(s): TSH, T4TOTAL, FREET4, T3FREE, THYROIDAB in the last 72 hours. Anemia Panel: No results for input(s): VITAMINB12, FOLATE, FERRITIN, TIBC, IRON, RETICCTPCT in the last 72 hours. Sepsis Labs: Recent Labs  Lab 08/31/21 2330  PROCALCITON <0.10     Recent Results (from the past 240 hour(s))  Resp Panel by RT-PCR (Flu A&B, Covid) Nasopharyngeal Swab     Status: None   Collection Time: 08/31/21 10:25 PM   Specimen: Nasopharyngeal Swab; Nasopharyngeal(NP) swabs in vial transport medium  Result Value Ref Range Status   SARS Coronavirus 2 by RT PCR NEGATIVE NEGATIVE Final    Comment: (NOTE) SARS-CoV-2 target nucleic acids are NOT DETECTED.  The SARS-CoV-2 RNA is generally detectable in upper respiratory specimens during the acute phase of infection. The lowest concentration of SARS-CoV-2 viral copies this assay can detect is 138 copies/mL. A negative result does not preclude SARS-Cov-2 infection and should not be used as the sole basis for treatment or other patient management decisions. A negative result may occur with  improper specimen collection/handling, submission of specimen other than nasopharyngeal swab, presence of viral mutation(s) within the areas targeted by this assay, and inadequate number of viral copies(<138 copies/mL). A negative result must be combined with clinical observations, patient history, and epidemiological information. The expected result is Negative.  Fact Sheet for Patients:  EntrepreneurPulse.com.au  Fact Sheet for Healthcare Providers:  IncredibleEmployment.be  This test is no t yet approved or cleared by the [place] FDA and  has been authorized for detection and/or diagnosis of SARS-CoV-2 by FDA under an Emergency Use Authorization (EUA). This EUA will remain  in effect (meaning this test can be used) for the  duration of the COVID-19 declaration under Section 564(b)(1) of the Act, 21 U.S.C.section 360bbb-3(b)(1), unless the authorization is terminated  or revoked sooner.       Influenza A by PCR NEGATIVE NEGATIVE Final   Influenza B by PCR NEGATIVE NEGATIVE Final    Comment: (NOTE) The Xpert Xpress SARS-CoV-2/FLU/RSV plus assay is intended as an aid in the diagnosis of influenza from Nasopharyngeal swab specimens and should not be used as a sole basis for treatment. Nasal washings and aspirates are unacceptable for Xpert Xpress SARS-CoV-2/FLU/RSV testing.  Fact Sheet for Patients: EntrepreneurPulse.com.au  Fact Sheet for Healthcare Providers: IncredibleEmployment.be  This test is not yet approved or cleared by the [place] FDA and has been  authorized for detection and/or diagnosis of SARS-CoV-2 by FDA under an Emergency Use Authorization (EUA). This EUA will remain in effect (meaning this test can be used) for the duration of the COVID-19 declaration under Section 564(b)(1) of the Act, 21 U.S.C. section 360bbb-3(b)(1), unless the authorization is terminated or revoked.  Performed at [facility], [place] [address], [place], [place] [address]   Urine Culture     Status: Abnormal   Collection Time: 08/31/21 11:33 PM   Specimen: Urine, Clean Catch  Result Value Ref Range Status   Specimen Description   Final    URINE, CLEAN CATCH Performed at [facility], [place] [address], [place], [place] [address]    Special Requests   Final    NONE Performed at [facility], [place] [address], [place], [place] [address]    Culture MULTIPLE SPECIES PRESENT, SUGGEST RECOLLECTION (A)  Final   Report Status 09/01/2021 FINAL  Final      Radiology Studies: CT HEAD WO CONTRAST (5MM)  Result Date: 09/03/2021 CLINICAL DATA:  Cerebral hemorrhage suspected EXAM: CT HEAD WITHOUT CONTRAST TECHNIQUE: Contiguous  axial images were obtained from the base of the skull through the vertex without intravenous contrast. COMPARISON:  Head CT 4 days ago 08/31/2021 FINDINGS: Brain: Patient had difficulty tolerating the exam, there is motion artifact despite repeat acquisition. Allowing for this, no acute intracranial hemorrhage. No subdural collection. No hydrocephalus or midline shift. Mild periventricular chronic small vessel ischemia is grossly stable but better assessed on prior. Vascular: Limited assessment due to motion. Skull: No skull fracture. Sinuses/Orbits: No acute findings. Other: None. IMPRESSION: Motion limited exam. Allowing for this, no acute intracranial abnormality. Electronically Signed   By: Keith Rake M.D.   On: 09/03/2021 21:25   DG ESOPHAGUS W SINGLE CM (SOL OR THIN BA)  Result Date: 09/04/2021 CLINICAL DATA:  Pneumonia EXAM: ESOPHOGRAM/BARIUM SWALLOW TECHNIQUE: Single contrast examination was performed using barium. FLUOROSCOPY TIME:  Fluoroscopy Time:  1 minute 6 seconds Radiation Exposure Index (if provided by the fluoroscopic device): 8.4 mGy COMPARISON:  None. FINDINGS: There is no aspiration. There is persistent narrowing at the gastroesophageal junction. Contrast does pass through this region but with delay. As result, there are multiple tertiary contractions proximally. Remainder of the esophagus demonstrates a normal caliber. IMPRESSION: Persistent narrowing at the gastroesophageal junction, which could reflect stricture or mass. Electronically Signed   By: Macy Mis M.D.   On: 09/04/2021 09:13    Scheduled Meds:  allopurinol  100 mg Oral Daily   azithromycin  500 mg Oral Daily   carbidopa-levodopa  1 tablet Oral QHS   carbidopa-levodopa  2.5 tablet Oral QID   furosemide  40 mg Intravenous BID   gabapentin  300 mg Oral QHS   heparin  5,000 Units Subcutaneous Q8H   insulin aspart  0-9 Units Subcutaneous TID WC   levothyroxine  75 mcg Oral QAC breakfast   losartan  50 mg Oral  Daily   metoprolol succinate  50 mg Oral Daily   QUEtiapine  25 mg Oral QHS   sodium chloride flush  3 mL Intravenous Q12H   Continuous Infusions:  sodium chloride Stopped (09/02/21 0417)   cefTRIAXone (ROCEPHIN)  IV Stopped (09/04/21 0107)     LOS: 3 days   Marylu Lund, MD [facility] Pager On Amion  If 7PM-7AM, please contact night-coverage 09/04/2021, 4:50 PM

## 2021-09-04 NOTE — Plan of Care (Signed)
  Problem: Education: Goal: Knowledge of General Education information will improve Description: Including pain rating scale, medication(s)/side effects and non-pharmacologic comfort measures Outcome: Progressing   Problem: Health Behavior/Discharge Planning: Goal: Ability to manage health-related needs will improve Outcome: Progressing   Problem: Clinical Measurements: Goal: Ability to maintain clinical measurements within normal limits will improve Outcome: Progressing Goal: Will remain free from infection Outcome: Progressing Goal: Diagnostic test results will improve Outcome: Progressing Goal: Respiratory complications will improve Outcome: Completed/Met Goal: Cardiovascular complication will be avoided Outcome: Progressing   Problem: Activity: Goal: Risk for activity intolerance will decrease Outcome: Progressing   Problem: Nutrition: Goal: Adequate nutrition will be maintained Outcome: Progressing   Problem: Coping: Goal: Level of anxiety will decrease Outcome: Progressing   Problem: Elimination: Goal: Will not experience complications related to bowel motility Outcome: Progressing Goal: Will not experience complications related to urinary retention Outcome: Completed/Met   Problem: Pain Managment: Goal: General experience of comfort will improve Outcome: Progressing   Problem: Safety: Goal: Ability to remain free from injury will improve Outcome: Progressing

## 2021-09-04 NOTE — Progress Notes (Signed)
Physical Therapy Treatment Patient Details Name: Stephanie [name] MRN: [id number] DOB: 08-19-1946 Today's Date: 09/04/2021   History of Present Illness 75 year old female patient who presented to ED with AMS. patient was noted to recently be in hospital from 9/19-9/27 for CHF. PMH significant for chronic systolic CHF (most recent EF = 40-45%), LBBB, hypertension, hyperlipidemia, diabetes mellitus type 2, chronic iron deficiency anemia, Parkinson's disease.    PT Comments    General Comments: AxO x 3 very sweet lady but also can get nervous. Daughter in room and very helpful/attentive.  Assisted pt with amb.  General transfer comment: required increased assist to rise due to rigidity/freezing and assist to complete turns due to freezing/shuffled steps.  HIGH FALL RISK posteriorly and poor dynamic balance with turning.  "Hands on" assist all time. General Gait Details: "hands on" assist all time due to Parkinson's gait of shuffling/freezing and posterior LOB.  Tolerated amb 25 feet in hallway to nurses staion then another 125 feet back after a seated rest break on Rollator seat.  Tolerated distance well,  Pt plans to D/C to SNF for ST Rehab.  Recommendations for follow up therapy are one component of a multi-disciplinary discharge planning process, led by the attending physician.  Recommendations may be updated based on patient status, additional functional criteria and insurance authorization.  Follow Up Recommendations  Skilled nursing-short term rehab (<3 hours/day)     Assistance Recommended at Discharge    Equipment Recommendations  None recommended by PT    Recommendations for Other Services       Precautions / Restrictions Precautions Precautions: Fall Precaution Comments: Parkinson's Restrictions Weight Bearing Restrictions: No     Mobility  Bed Mobility               General bed mobility comments: OOB in recliner    Transfers Overall transfer level: Needs  assistance Equipment used: Rollator (4 wheels) Transfers: Sit to/from [facility] Sit to Stand: Min assist;Mod assist Stand pivot transfers: Mod assist;Max assist         General transfer comment: required increased assist to rise due to rigidity/freezing and assist to complete turns due to freezing/shuffled steps.  HIGH FALL RISK posteriorly and poor dynamic balance with turning.  "Hands on" assist all time.    Ambulation/Gait Ambulation/Gait assistance: Min assist Gait Distance (Feet): 50 Feet (25 feet x 2 one seated rest break on Rollator seat) Assistive device: Rollator (4 wheels) Gait Pattern/deviations: Step-to pattern;Step-through pattern;Staggering left;Staggering right Gait velocity: decreased   General Gait Details: "hands on" assist all time due to Parkinson's gait of shuffling/freezing and posterior LOB.  Tolerated amb 25 feet in hallway to nurses staion then another 125 feet back after a seated rest break on Rollator seat.  Tolerated distance well,   Stairs             Wheelchair Mobility    Modified Rankin (Stroke Patients Only)       Balance                                            Cognition Arousal/Alertness: Awake/alert Behavior During Therapy: WFL for tasks assessed/performed Overall Cognitive Status: Within Functional Limits for tasks assessed  General Comments: AxO x 3 very sweet lady but also can get nervous.        Exercises      General Comments        Pertinent Vitals/Pain Pain Assessment: Faces Faces Pain Scale: Hurts a little bit Pain Location: BLE when standing. Pain Descriptors / Indicators: Discomfort Pain Intervention(s): Monitored during session;Repositioned    Home Living                          Prior Function            PT Goals (current goals can now be found in the care plan section) Progress towards PT goals:  Progressing toward goals    Frequency    Min 2X/week      PT Plan Current plan remains appropriate    Co-evaluation              AM-PAC PT "6 Clicks" Mobility   Outcome Measure  Help needed turning from your back to your side while in a flat bed without using bedrails?: A Little Help needed moving from lying on your back to sitting on the side of a flat bed without using bedrails?: A Little Help needed moving to and from a bed to a chair (including a wheelchair)?: A Little Help needed standing up from a chair using your arms (e.g., wheelchair or bedside chair)?: A Little Help needed to walk in hospital room?: A Little Help needed climbing 3-5 steps with a railing? : A Lot 6 Click Score: 17    End of Session Equipment Utilized During Treatment: Gait belt Activity Tolerance: Patient tolerated treatment well Patient left: in chair;with call bell/phone within reach;with family/visitor present Nurse Communication: Mobility status PT Visit Diagnosis: Unsteadiness on feet (R26.81);Other symptoms and signs involving the nervous system (R29.898)     Time: 8115-7262 PT Time Calculation (min) (ACUTE ONLY): 28 min  Charges:  $Gait Training: 8-22 mins $Therapeutic Activity: 8-22 mins                     Rica Koyanagi  PTA Acute  Rehabilitation Services Pager      [phone] Office      [phone]

## 2021-09-04 NOTE — TOC Progression Note (Signed)
Transition of Care [facility]) - Progression Note    Patient Details  Name: Stephanie [name] MRN: [id number] Date of Birth: 02-07-1946  Transition of Care [facility]) CM/SW Contact  Jenel Gierke, Juliann Pulse, RN Phone Number: 09/04/2021, 2:06 PM  Clinical Narrative: Await bed choice per dtr Darlina Guys will discuss with patient-await choice.   1. 1.3 mi [place] [facility] [place] [address], [place] [address] [phone] Overall rating Above average 2. 1.6 mi [place] at [place], [place] [address] [phone] Overall rating Much below average 3. 2.1 mi [place], [place] [address] [phone] Overall rating Much below average 4. 2.5 mi [facility] [place], [place] [address] [phone] Overall rating Below average 5. 2.8 mi [facility] at the [place], [place] [address] [phone] Overall rating Average 6. 2.8 mi [place] [address] [place], [place] [address] [phone] Overall rating Much below average 7. 3 mi [facility] [place], [place] [address] [phone] Overall rating Above average 8. 3.6 [place] [address] [place], [place] [address] [phone] Overall rating Average 9. 3.6 mi [facility] [address], [place] [address] [phone] Overall rating Much below average 10. 3.9 mi [facility] [place], [place] [address] [phone] Overall rating Much below average 11. 4.4 mi [facility] at [place], [place] [address] [phone] Overall rating Much above average 12. 4.6 mi [facility] [address] [place], [place] [address] [phone] Overall rating Much above average 13. 5.5 mi [facility] [address] [place], [place] [address] [phone] Overall rating Above average 14. 8.2 [facility] [place], [place] [address] [phone] Overall rating Much above average 15. 9 mi [facility] [address] [place], [place] [address] [phone] Overall rating Below average 16. 9.1 [place] and [place], [place] [address] [phone] Overall rating Much below average 17. 9.2 mi [facility] [address] [place], [place] [address] [phone] Overall rating Much above average 18. 10.8 mi [place] [facility] [address] [place], [place] [address] [phone] Overall rating Much above average 19. 12.6 mi [facility] [address] [place], [place] [address] [phone] Overall rating Much below average 20. 12.8 [facility] [place], [place] [address] [phone] Overall rating Much below average 21. 14.2 mi [facility] [place] [facility] [address] [place], [place] [address] [phone] Overall rating Much below average 22. 14.4 mi [facility] at [place], [place] [address] [phone] Overall rating Above average 23. 14.8 mi [place] [facility] [place], [place] [address] [phone] Overall rating Much above average 24. 14.9 [place] [address] [place], [place] [address] [phone] Overall rating Much below average 25. 16.5 mi [place] [address] [place], [place] [address] [phone] Overall rating Average 26. 16.7 mi [facility] [place],  [address] [phone] Overall rating Above average  27. 17.9 mi [place], [place] [address] [phone] Overall rating Average 28. 93.9 [facility] [address] [place], [place] [address] [phone] Overall rating Much below average 29. 19.7 mi [facility] and [facility] [address] [place], [place] [address] [phone] Overall rating Much below average 30. 20 [address] at [employer] at [facility], [place] [address] [phone] Overall rating Much above average 31. 21.1 mi [place], [place] [address] [phone] Overall rating Much below average 32. 21.6 [address] [place], [place] [address] [phone] Overall rating Below average 33. 70.4 [facility] [address] [place], [place] [address] [phone] Overall rating Below average 34. 21.8 [place], [place] [address] [phone] Overall rating Above average 35. [address] [place], [place] [address] [phone] Overall rating Much above average 36. 22.6 mi [facility] [address] [place], [place] [address] [phone] Overall rating Average 37. 22.7 mi [facility] [place] [address] [place], [place] [address] [phone] Overall rating Much below average 38. 23.3 mi [facility] [place] [address] [place], [place] [address] [phone] Overall rating Above average 39. 23.5 [place], [place] [address] [phone] Overall rating Not available18 40. 24.1 mi [place] [address] [place], [place] [address] [phone] Overall rating Much below average 41. 24.2 mi [place] [address] [place], [place] [address] [phone] Overall rating Below average 42. 24.4 [facility],  [address] [phone] Overall rating Much below average 43. 24.5 mi [facility] [place],  [address] [phone] Overall rating Above average To explore an   Expected Discharge Plan: [place] Barriers to Discharge: Continued Medical Work up  Expected Discharge Plan and Services Expected Discharge Plan: [place]   Discharge Planning Services: CM Consult   Living arrangements for the past 2 months: Single Family Home                                       Social Determinants of Health (SDOH) Interventions    Readmission Risk Interventions Readmission Risk Prevention Plan 09/03/2021  Transportation Screening Complete  PCP or Specialist Appt within 3-5 Days Complete  HRI or [place] Complete  Social Work Consult for [place] Planning/Counseling Complete  Palliative Care Screening Not Applicable  Medication Review Press photographer) Complete  Some recent data might be hidden

## 2021-09-04 NOTE — Procedures (Signed)
EEG Study Report  Name: Stephanie [name] Account Number: [id number] Attending: Ophelia Charter, M.D. Date: 09/04/2021  Duration: 22 min  Patient history: 75 year old female with PD and CHF with generalized weakness.  Level of alertness: awake   AEDs during EEG study: alprazolam, gabapentin   Technical aspects: This video-EEG study was done with 19 scalp electrodes positioned according to the 10-20 International system of electrode placement. Electrical activity was acquired at a sampling rate of 500Hz  and reviewed with a high frequency filter of 70Hz  and a low frequency filter of 1Hz . Video-EEG data were recorded continuously and digitally stored.    Software used: Natus with Scientist, physiological.  BACKGROUND ACTIVITY: Posterior dominant rhythm: The posterior dominant rhythm consisted of a 6-7 Hz moderate voltage rhythm distributed symmetrically in the posterior head regions that had limited variability.    Beta: There is 15 to 18 Hz, 2-3 uV beta activity with irregular morphology distributed symmetrically and diffusely.  Slowing: none   Attenuation: none   EPILEPTIFORM ACTIVITY: Interictal epileptiform activity: none   Ictal Activity: none   OTHER EVENTS: Prominent motion artifact was present throughout the majority of the recording.  SLEEP RECORDINGS:  Sleep architecture was not recorded.   ACTIVATION PROCEDURES:  Hyperventilation and photic stimulation were not performed.  IMPRESSION: This study is abnormal due to mild slowing of the posterior background consistent with mild encephalopathy.   No seizures or epileptiform discharges were seen throughout the recording.

## 2021-09-04 NOTE — Progress Notes (Signed)
EEG completed, results pending. 

## 2021-09-04 NOTE — Progress Notes (Signed)
Pt c/o severe right hand pain after lab draw this morning. Describes pain as burning. Visible knot on the top of hand. Ice pack placed on hand. Dr. Wyline Copas made aware.

## 2021-09-05 LAB — GLUCOSE, CAPILLARY
Glucose-Capillary: 139 mg/dL — ABNORMAL HIGH (ref 70–99)
Glucose-Capillary: 188 mg/dL — ABNORMAL HIGH (ref 70–99)
Glucose-Capillary: 182 mg/dL — ABNORMAL HIGH (ref 70–99)
Glucose-Capillary: 195 mg/dL — ABNORMAL HIGH (ref 70–99)

## 2021-09-05 LAB — COMPREHENSIVE METABOLIC PANEL
ALT: <5 U/L (ref 0–44)
AST: 9 U/L — ABNORMAL LOW (ref 15–41)
Albumin: 2.9 g/dL — ABNORMAL LOW (ref 3.5–5.0)
Alkaline Phosphatase: 74 U/L (ref 38–126)
Anion gap: 8 (ref 5–15)
BUN: 33 mg/dL — ABNORMAL HIGH (ref 8–23)
CO2: 29 mmol/L (ref 22–32)
Calcium: 8.4 mg/dL — ABNORMAL LOW (ref 8.9–10.3)
Chloride: 97 mmol/L — ABNORMAL LOW (ref 98–111)
Creatinine, Ser: 1.14 mg/dL — ABNORMAL HIGH (ref 0.44–1.00)
GFR, Estimated: 50 mL/min — ABNORMAL LOW (ref >60)
Glucose, Bld: 102 mg/dL — ABNORMAL HIGH (ref 70–99)
Potassium: 4.1 mmol/L (ref 3.5–5.1)
Sodium: 134 mmol/L — ABNORMAL LOW (ref 135–145)
Total Bilirubin: 0.7 mg/dL (ref 0.3–1.2)
Total Protein: 6.5 g/dL (ref 6.5–8.1)

## 2021-09-05 LAB — MAGNESIUM: Magnesium: 2.4 mg/dL (ref 1.7–2.4)

## 2021-09-05 MED ORDER — LIDOCAINE-PRILOCAINE 2.5-2.5 % EX CREA
TOPICAL_CREAM | Freq: Once | CUTANEOUS | Status: AC
Start: 2021-09-05 — End: 2021-09-05
  Filled 2021-09-05: qty 5

## 2021-09-05 NOTE — Progress Notes (Signed)
   09/05/21 1013  Vitals  Temp 97.8 F (36.6 C)  Temp Source Oral  BP (!) 103/52  MAP (mmHg) 69  BP Location Left Arm  BP Method Automatic  Patient Position (if appropriate) Sitting  Pulse Rate 63  Pulse Rate Source Monitor  ECG Heart Rate 61  Resp 13  MEWS COLOR  MEWS Score Color Green  Oxygen Therapy  SpO2 91 %  O2 Device Room Air  Pain Assessment  Pain Scale 0-10  Pain Score 0  MEWS Score  MEWS Temp 0  MEWS Systolic 0  MEWS Pulse 0  MEWS RR 1  MEWS LOC 0  MEWS Score 1   Secure Chat sent to Dr. Wyline Copas: Good morning. Patient's BP this AM is 103/52 HR 60, she is due for Losartan 50 mg oral and metoprolol succinate 50 mg oral. Would you like me to give these medications with the current BP?   Awaiting response from provider regarding AM BP medication with current blood pressure.

## 2021-09-05 NOTE — Progress Notes (Signed)
PROGRESS NOTE    Stephanie [name]  NTZ:[id number] DOB: 1946-06-12 DOA: 08/31/2021 PCP: [name], MD    Brief Narrative:  75 year old female with history of systolic CHF (TTE September 2022 showed EF 25-30%), chronic hypoxia with 2 L oxygen at night, DM2, HTN, HLD, Parkinson's disease, left bundle branch block, comes into the hospital for generalized weakness.  She was recently hospitalized in September 2022 for acute decompensated heart failure, was diuresed and weight on discharge was 182 pounds.  Of note, during prior hospitalization there was concern about a possible esophageal foreign body on CT angiogram, she was evaluated by speech and considered high risk for aspiration not a good candidate for barium esophagogram.  She was able to tolerate p.o. without issues.  Complains of weakness currently, inability to walk, and increased swelling in her legs  Assessment & Plan:   Principal Problem:   Acute on chronic HFrEF (heart failure with reduced ejection fraction) (HCC) Active Problems:   Type 2 diabetes mellitus (HCC)   Hypothyroid   Parkinson's disease (HCC)   Hyperlipidemia associated with type 2 diabetes mellitus ([place])   Hypertension associated with diabetes ([place])   CAP (community acquired pneumonia)  Principal Problem Acute on chronic systolic CHF-came into the hospital with worsening dyspnea, increased edema and elevated BNP.  She was started on furosemide 40 mg twice daily, continue.  -Wt down to 149lbs today, down 8lbs since admit -Continue home medication with losartan, metoprolol -Most recent 2D echo done in September 2022 when she was admitted with fluid overload at [facility] showed an EF of 25-30%, severely decreased LV function, global hypokinesis.   -Appear stable improved from a volume standpoint. Cr slightly higher at 1.14 thus lasix has been held -Patient and family reports that patient is followed closely by cardiology as outpatient    Active  Problems Lobar pneumonia, right upper lobe-chest x-ray concerning for right upper lobe pneumonia.  She definitely is at risk for aspiration.  She has been placed on antibiotics with ceftriaxone and azithromycin, continue current antibiotics for now. -Per speech pathologist, esophagogram was recommended.  Reviewed.  Patient noted to have a persistent narrowing at the gastroesophageal junction which could reflect stricture or mass. -Appreciate input by GI. Recommendations to f/u with primary GI when ultimately discharged   Acute metabolic encephalopathy- -Seems to be much improved, conversing appropriately   Lower extremity cellulitis-there is a degree of venous stasis changes however the rash looks bright red, somewhat tender to palpation and cannot exclude a degree of cellulitis.  She is covered with ceftriaxone, appears to be improved   Generalized weakness/deconditioning-normally ambulates with a walker but unable to do so in the last few days.  PT/OT consulted, thus far recommendations for skilled nursing facility recommended, TOC following   History of dysphagia-speech consulted, appreciate recommendations -Per above, esophagram reviewed with GI recs noted   Essential hypertension-continue home medications as below  Hypothyroidism-continue Synthroid as tolerated   Parkinson's disease-continue Sinemet as tolerated -Resting tremor noted on exam   Chest pain-EKG with LBBB however this is known to be chronic.   -Serial troponins reviewed, serially negative   LBBB-noted   DM2 -continue sliding scale as needed  Anxiety -Patient reports being afraid to sleep at night, citing fear that SNF may not attend to her needs in a timely manner, leading up to periods of panic.  Patient tearful when describing these feelings. -Appreciate assistance by psychiatry.  Recs noted for quetiapine and consideration for input by neurology in light of  patient's history of Parkinson's disease.  DVT  prophylaxis: Heparin subq Code Status: Full Family Communication: Pt in room, family currently not at bedside  Status is: Inpatient  Remains inpatient appropriate because: severity of ill ness   Consultants:  Psychiatry  Procedures:    Antimicrobials: Anti-infectives (From admission, onward)    Start     Dose/Rate Route Frequency Ordered Stop   09/03/21 1000  azithromycin (ZITHROMAX) tablet 500 mg        500 mg Oral Daily 09/02/21 0905     09/01/21 0100  cefTRIAXone (ROCEPHIN) 1 g in sodium chloride 0.9 % 100 mL IVPB        1 g 200 mL/hr over 30 Minutes Intravenous Every 24 hours 09/01/21 0033     09/01/21 0100  azithromycin (ZITHROMAX) 500 mg in sodium chloride 0.9 % 250 mL IVPB  Status:  Discontinued        500 mg 250 mL/hr over 60 Minutes Intravenous Every 24 hours 09/01/21 0033 09/02/21 0905   08/31/21 2345  cefTRIAXone (ROCEPHIN) 1 g in sodium chloride 0.9 % 100 mL IVPB  Status:  Discontinued        1 g 200 mL/hr over 30 Minutes Intravenous  Once 08/31/21 2332 09/01/21 0033   08/31/21 2345  azithromycin (ZITHROMAX) 500 mg in sodium chloride 0.9 % 250 mL IVPB  Status:  Discontinued        500 mg 250 mL/hr over 60 Minutes Intravenous  Once 08/31/21 2332 09/01/21 0033       Subjective: Reports sleeing better. Less tremors today. Overall reports feeling better  Objective: Vitals:   09/04/21 2040 09/05/21 0517 09/05/21 1013 09/05/21 1311  BP: 127/76 112/60 (!) 103/52 97/72  Pulse: 62 63 63 64  Resp: 20 20 13 20   Temp: (!) 97.3 F (36.3 C) 98.3 F (36.8 C) 97.8 F (36.6 C) 98.2 F (36.8 C)  TempSrc: Oral Oral Oral Oral  SpO2: 99% (!) 89% 91% 90%  Weight:      Height:        Intake/Output Summary (Last 24 hours) at 09/05/2021 1505 Last data filed at 09/05/2021 1024 Gross per 24 hour  Intake 343 ml  Output --  Net 343 ml    Filed Weights   09/02/21 0548 09/03/21 0550 09/04/21 1341  Weight: 67.7 kg 70 kg 69.6 kg    Examination: General exam: Conversant,  in no acute distress Respiratory system: normal chest rise, clear, no audible wheezing Cardiovascular system: regular rhythm, s1-s2 Gastrointestinal system: Nondistended, nontender, pos BS Central nervous system: No seizures, no tremors Extremities: No cyanosis, no joint deformities Skin: No rashes, no pallor, resting tremor improved Psychiatry: Affect normal // no auditory hallucinations   Data Reviewed: I have personally reviewed following labs and imaging studies  CBC: Recent Labs  Lab 08/31/21 2223 09/01/21 0426 09/02/21 0430 09/03/21 0424  WBC 6.3 5.7 5.6 6.1  NEUTROABS 4.6  --   --   --   HGB 11.9* 11.1* 11.2* 11.0*  HCT 38.2 36.5 36.3 35.1*  MCV 84.9 86.1 84.4 83.6  PLT 175 162 150 786    Basic Metabolic Panel: Recent Labs  Lab 09/01/21 0426 09/02/21 0430 09/03/21 0424 09/04/21 0418 09/05/21 0512  NA 139 137 138 137 134*  K 3.7 3.1* 3.8 4.1 4.1  CL 106 100 102 100 97*  CO2 25 26 27 28 29   GLUCOSE 106* 101* 130* 84 102*  BUN 27* 22 20 26* 33*  CREATININE 0.83 0.62 0.66 0.92 1.14*  CALCIUM 8.7* 8.3* 8.3* 8.3* 8.4*  MG 1.5* 1.5*  --  1.6* 2.4    GFR: Estimated Creatinine Clearance: 37.1 mL/min (A) (by C-G formula based on SCr of 1.14 mg/dL (H)). Liver Function Tests: Recent Labs  Lab 08/31/21 2223 09/02/21 0430 09/03/21 0424 09/04/21 0418 09/05/21 0512  AST 15 11* 11* 12* 9*  ALT 14 <5 <5 <5 <5  ALKPHOS 97 89 85 81 74  BILITOT 0.8 0.9 0.6 0.9 0.7  PROT 7.4 6.9 6.7 6.6 6.5  ALBUMIN 3.4* 3.2* 3.1* 3.2* 2.9*    No results for input(s): LIPASE, AMYLASE in the last 168 hours. No results for input(s): AMMONIA in the last 168 hours. Coagulation Profile: No results for input(s): INR, PROTIME in the last 168 hours. Cardiac Enzymes: Recent Labs  Lab 09/01/21 0426  CKTOTAL 53    BNP (last 3 results) No results for input(s): PROBNP in the last 8760 hours. HbA1C: No results for input(s): HGBA1C in the last 72 hours. CBG: Recent Labs  Lab  09/04/21 1131 09/04/21 1647 09/04/21 2028 09/05/21 0737 09/05/21 1132  GLUCAP 191* 204* 100* 139* 188*    Lipid Profile: No results for input(s): CHOL, HDL, LDLCALC, TRIG, CHOLHDL, LDLDIRECT in the last 72 hours. Thyroid Function Tests: No results for input(s): TSH, T4TOTAL, FREET4, T3FREE, THYROIDAB in the last 72 hours. Anemia Panel: No results for input(s): VITAMINB12, FOLATE, FERRITIN, TIBC, IRON, RETICCTPCT in the last 72 hours. Sepsis Labs: Recent Labs  Lab 08/31/21 2330  PROCALCITON <0.10     Recent Results (from the past 240 hour(s))  Resp Panel by RT-PCR (Flu A&B, Covid) Nasopharyngeal Swab     Status: None   Collection Time: 08/31/21 10:25 PM   Specimen: Nasopharyngeal Swab; Nasopharyngeal(NP) swabs in vial transport medium  Result Value Ref Range Status   SARS Coronavirus 2 by RT PCR NEGATIVE NEGATIVE Final    Comment: (NOTE) SARS-CoV-2 target nucleic acids are NOT DETECTED.  The SARS-CoV-2 RNA is generally detectable in upper respiratory specimens during the acute phase of infection. The lowest concentration of SARS-CoV-2 viral copies this assay can detect is 138 copies/mL. A negative result does not preclude SARS-Cov-2 infection and should not be used as the sole basis for treatment or other patient management decisions. A negative result may occur with  improper specimen collection/handling, submission of specimen other than nasopharyngeal swab, presence of viral mutation(s) within the areas targeted by this assay, and inadequate number of viral copies(<138 copies/mL). A negative result must be combined with clinical observations, patient history, and epidemiological information. The expected result is Negative.  Fact Sheet for Patients:  EntrepreneurPulse.com.au  Fact Sheet for Healthcare Providers:  IncredibleEmployment.be  This test is no t yet approved or cleared by the [place] FDA and  has been authorized  for detection and/or diagnosis of SARS-CoV-2 by FDA under an Emergency Use Authorization (EUA). This EUA will remain  in effect (meaning this test can be used) for the duration of the COVID-19 declaration under Section 564(b)(1) of the Act, 21 U.S.C.section 360bbb-3(b)(1), unless the authorization is terminated  or revoked sooner.       Influenza A by PCR NEGATIVE NEGATIVE Final   Influenza B by PCR NEGATIVE NEGATIVE Final    Comment: (NOTE) The Xpert Xpress SARS-CoV-2/FLU/RSV plus assay is intended as an aid in the diagnosis of influenza from Nasopharyngeal swab specimens and should not be used as a sole basis for treatment. Nasal washings and aspirates are unacceptable for Xpert Xpress SARS-CoV-2/FLU/RSV testing.  Fact  Sheet for Patients: EntrepreneurPulse.com.au  Fact Sheet for Healthcare Providers: IncredibleEmployment.be  This test is not yet approved or cleared by the [place] FDA and has been authorized for detection and/or diagnosis of SARS-CoV-2 by FDA under an Emergency Use Authorization (EUA). This EUA will remain in effect (meaning this test can be used) for the duration of the COVID-19 declaration under Section 564(b)(1) of the Act, 21 U.S.C. section 360bbb-3(b)(1), unless the authorization is terminated or revoked.  Performed at [facility], [place] [address], [place], [place] [address]   Urine Culture     Status: Abnormal   Collection Time: 08/31/21 11:33 PM   Specimen: Urine, Clean Catch  Result Value Ref Range Status   Specimen Description   Final    URINE, CLEAN CATCH Performed at [facility], [place] [address] [place], [place] [address]    Special Requests   Final    NONE Performed at [facility], [place] [address] [place], [place] [address]    Culture MULTIPLE SPECIES PRESENT, SUGGEST RECOLLECTION (A)  Final   Report Status 09/01/2021 FINAL  Final       Radiology Studies: CT HEAD WO CONTRAST (5MM)  Result Date: 09/03/2021 CLINICAL DATA:  Cerebral hemorrhage suspected EXAM: CT HEAD WITHOUT CONTRAST TECHNIQUE: Contiguous axial images were obtained from the base of the skull through the vertex without intravenous contrast. COMPARISON:  Head CT 4 days ago 08/31/2021 FINDINGS: Brain: Patient had difficulty tolerating the exam, there is motion artifact despite repeat acquisition. Allowing for this, no acute intracranial hemorrhage. No subdural collection. No hydrocephalus or midline shift. Mild periventricular chronic small vessel ischemia is grossly stable but better assessed on prior. Vascular: Limited assessment due to motion. Skull: No skull fracture. Sinuses/Orbits: No acute findings. Other: None. IMPRESSION: Motion limited exam. Allowing for this, no acute intracranial abnormality. Electronically Signed   By: Keith Rake M.D.   On: 09/03/2021 21:25   EEG adult  Result Date: 09/04/2021 Ophelia Charter     09/04/2021  6:55 PM EEG Study Report Name: Carra Brindley Account Number: [id number] Attending: Ophelia Charter, M.D. Date: 09/04/2021 Duration: 22 min Patient history: 75 year old female with PD and CHF with generalized weakness. Level of alertness: awake AEDs during EEG study: alprazolam, gabapentin Technical aspects: This video-EEG study was done with 19 scalp electrodes positioned according to the 10-20 International system of electrode placement. Electrical activity was acquired at a sampling rate of 500Hz  and reviewed with a high frequency filter of 70Hz  and a low frequency filter of 1Hz . Video-EEG data were recorded continuously and digitally stored.   Software used: Natus with Scientist, physiological. BACKGROUND ACTIVITY: Posterior dominant rhythm: The posterior dominant rhythm consisted of a 6-7 Hz moderate voltage rhythm distributed symmetrically in the posterior head regions that had limited variability.  Beta: There is 15 to 18  Hz, 2-3 uV beta activity with irregular morphology distributed symmetrically and diffusely. Slowing: none Attenuation: none EPILEPTIFORM ACTIVITY: Interictal epileptiform activity: none Ictal Activity: none OTHER EVENTS: Prominent motion artifact was present throughout the majority of the recording. SLEEP RECORDINGS: Sleep architecture was not recorded. ACTIVATION PROCEDURES: Hyperventilation and photic stimulation were not performed. IMPRESSION: This study is abnormal due to mild slowing of the posterior background consistent with mild encephalopathy. No seizures or epileptiform discharges were seen throughout the recording.  DG ESOPHAGUS W SINGLE CM (SOL OR THIN BA)  Result Date: 09/04/2021 CLINICAL DATA:  Pneumonia EXAM: ESOPHOGRAM/BARIUM SWALLOW TECHNIQUE: Single contrast examination was performed using barium.  FLUOROSCOPY TIME:  Fluoroscopy Time:  1 minute 6 seconds Radiation Exposure Index (if provided by the fluoroscopic device): 8.4 mGy COMPARISON:  None. FINDINGS: There is no aspiration. There is persistent narrowing at the gastroesophageal junction. Contrast does pass through this region but with delay. As result, there are multiple tertiary contractions proximally. Remainder of the esophagus demonstrates a normal caliber. IMPRESSION: Persistent narrowing at the gastroesophageal junction, which could reflect stricture or mass. Electronically Signed   By: Macy Mis M.D.   On: 09/04/2021 09:13    Scheduled Meds:  allopurinol  100 mg Oral Daily   azithromycin  500 mg Oral Daily   carbidopa-levodopa  1 tablet Oral QHS   carbidopa-levodopa  2.5 tablet Oral QID   gabapentin  300 mg Oral QHS   heparin  5,000 Units Subcutaneous Q8H   insulin aspart  0-9 Units Subcutaneous TID WC   levothyroxine  75 mcg Oral QAC breakfast   losartan  50 mg Oral Daily   metoprolol succinate  50 mg Oral Daily   QUEtiapine  25 mg Oral QHS   sertraline  25 mg Oral Daily   sodium chloride flush  3 mL Intravenous  Q12H   Continuous Infusions:  sodium chloride Stopped (09/02/21 0417)   cefTRIAXone (ROCEPHIN)  IV Stopped (09/05/21 0126)     LOS: 4 days   Marylu Lund, MD [facility] Pager On Amion  If 7PM-7AM, please contact night-coverage 09/05/2021, 3:05 PM

## 2021-09-05 NOTE — Consult Note (Signed)
[facility] Consultation Note  Referring Provider: [facility] Primary Care Physician:  [name], MD Primary Gastroenterologist:  Dr.Toledo [facility])  Reason for Consultation:  dysphagia  HPI: Stephanie [name] is a 75 y.o. female admitted with acute on chronic CHF as well as pneumonia.  Chronic dysphagia for years, with recent esophagram showing sluggish passage of contrast from esophagus to cardia.  Has being eating well in hospital this admission.  EGD in 2020 at [facility] clinic for dysphagia negative, empiric dilatation to 58 Maloney (16 Fr) did not improve her symptoms.   Past Medical History:  Diagnosis Date   Anemia    Arthritis    CHF (congestive heart failure) (HCC)    Chronic back pain    Diabetes mellitus without complication (HCC)    Dysrhythmia    GERD (gastroesophageal reflux disease)    Hyperlipidemia    Hypertension    Hypothyroid    Multilevel degenerative disc disease    Parkinson's disease ([place])    Sleep apnea    Syncope    Tremors of nervous system    Wears dentures    full upper and lower    Past Surgical History:  Procedure Laterality Date   ABDOMINAL HYSTERECTOMY     BACK SURGERY  09/16   CARDIAC CATHETERIZATION     CATARACT EXTRACTION W/PHACO Right 11/24/2015   Procedure: CATARACT EXTRACTION PHACO AND INTRAOCULAR LENS PLACEMENT ([place]);  Surgeon: Estill Cotta, MD;  Location: [facility] ORS;  Service: Ophthalmology;  Laterality: Right;  [place] 01:14 AP% 25.5 CDE 30.69 fluid pack lot # [id number]   CATARACT EXTRACTION W/PHACO Left 01/06/2021   Procedure: CATARACT EXTRACTION PHACO AND INTRAOCULAR LENS PLACEMENT (I[facility]) LEFT DIABETIC 6.98 00:40.1;  Surgeon: Birder Robson, MD;  Location: [facility];  Service: Ophthalmology;  Laterality: Left;  Diabetic - oral meds   COLONOSCOPY     COLONOSCOPY WITH PROPOFOL N/A 07/24/2019   Procedure: COLONOSCOPY WITH PROPOFOL;  Surgeon: Toledo, Benay Pike, MD;  Location: [facility]  ENDOSCOPY;  Service: Gastroenterology;  Laterality: N/A;   ESOPHAGOGASTRODUODENOSCOPY N/A 07/24/2019   Procedure: ESOPHAGOGASTRODUODENOSCOPY (EGD);  Surgeon: Toledo, Benay Pike, MD;  Location: [facility] ENDOSCOPY;  Service: Gastroenterology;  Laterality: N/A;   OOPHORECTOMY      Prior to Admission medications   Medication Sig Start Date End Date Taking? Authorizing Provider  allopurinol (ZYLOPRIM) 100 MG tablet Take 100 mg by mouth daily.   Yes [provider]  carbidopa-levodopa (SINEMET CR) 50-200 MG tablet Take 1 tablet by mouth at bedtime.   Yes [provider]  carbidopa-levodopa (SINEMET IR) 25-100 MG tablet Take 2.5 tablets by mouth 4 (four) times daily.   Yes [provider]  docusate sodium (COLACE) 100 MG capsule Take 100 mg by mouth 2 (two) times daily as needed for mild constipation.   Yes [provider]  fluticasone (FLONASE) 50 MCG/ACT nasal spray Place 1-2 sprays into the nose daily as needed for allergies. 09/29/15  Yes [provider]  gabapentin (NEURONTIN) 300 MG capsule Take 300 mg by mouth at bedtime.   Yes [provider]  glucose 4 GM chewable tablet Chew 1 tablet by mouth as needed for low blood sugar.   Yes [provider]  levothyroxine (SYNTHROID) 100 MCG tablet Take 100 mcg by mouth daily before breakfast.   Yes [provider]  losartan (COZAAR) 50 MG tablet Take 1 tablet (50 mg total) by mouth daily. 07/29/21 09/01/21 Yes Sreenath, Sudheer B, MD  metFORMIN (GLUCOPHAGE) 1000 MG tablet Take  1,000 mg by mouth 2 (two) times daily with a meal.   Yes [provider]  metoprolol succinate (TOPROL-XL) 50 MG 24 hr tablet Take 50 mg by mouth daily. Take with or immediately following a meal.   Yes [provider]  Multiple Vitamin (MULTIVITAMIN WITH MINERALS) TABS tablet Take 1 tablet by mouth daily.   Yes [provider]  oxybutynin (DITROPAN-XL) 10 MG 24 hr tablet Take 10 mg by mouth  daily.   Yes [provider]  oxyCODONE-acetaminophen (PERCOCET/ROXICET) 5-325 MG tablet Take 1 tablet by mouth every 6 (six) hours as needed for severe pain.   Yes [provider]  OXYGEN Inhale into the lungs as needed.   Yes [provider]  pioglitazone (ACTOS) 30 MG tablet Take 30 mg by mouth daily. 11/10/20  Yes [provider]  pramipexole (MIRAPEX) 0.125 MG tablet Take 0.125-0.25 mg by mouth See admin instructions. Taking 1 tablet three times daily and 2 pills (0.25 mg) at bedtime   Yes [provider]  pravastatin (PRAVACHOL) 40 MG tablet Take 40 mg by mouth daily.   Yes [provider]  QUEtiapine (SEROQUEL) 25 MG tablet Take 25 mg by mouth at bedtime. X 30 days 08/19/21  Yes [provider]  vitamin B-12 (CYANOCOBALAMIN) 500 MCG tablet Take 500 mcg by mouth daily.   Yes [provider]  Continuous Blood Gluc Sensor (FREESTYLE LIBRE 14 DAY SENSOR) MISC by Does not apply route.    [provider]  levothyroxine (SYNTHROID) 75 MCG tablet Take 75 mcg by mouth daily. Patient not taking: No sig reported 07/20/21 07/20/22  [provider]  glimepiride (AMARYL) 4 MG tablet Take 4 mg by mouth 2 (two) times daily. Pt. Takes 1 tablet in the morning and .5 tablet at night.  12/05/20  [provider]    Current Facility-Administered Medications  Medication Dose Route Frequency Provider Last Rate Last Admin   0.9 %  sodium chloride infusion   Intravenous PRN Caren Griffins, MD   Stopped at 09/02/21 0417   acetaminophen (TYLENOL) tablet 650 mg  650 mg Oral Q6H PRN Lenore Cordia, MD   650 mg at 09/03/21 0559   Or   acetaminophen (TYLENOL) suppository 650 mg  650 mg Rectal Q6H PRN Lenore Cordia, MD       allopurinol (ZYLOPRIM) tablet 100 mg  100 mg Oral Daily Donne Hazel, MD   100 mg at 09/05/21 1024   ALPRAZolam Duanne Moron) tablet 0.25 mg  0.25 mg Oral BID PRN Donne Hazel, MD   0.25 mg at 09/04/21  2056   azithromycin (ZITHROMAX) tablet 500 mg  500 mg Oral Daily Leodis Sias T, RPH   500 mg at 09/05/21 1024   carbidopa-levodopa (SINEMET CR) 50-200 MG per tablet controlled release 1 tablet  1 tablet Oral QHS Zada Finders R, MD   1 tablet at 09/04/21 2059   carbidopa-levodopa (SINEMET IR) 25-100 MG per tablet immediate release 2.5 tablet  2.5 tablet Oral QID Zada Finders R, MD   2.5 tablet at 09/05/21 1023   cefTRIAXone (ROCEPHIN) 1 g in sodium chloride 0.9 % 100 mL IVPB  1 g Intravenous Q24H Zada Finders R, MD   Stopping Infusion hung by another clincian at 09/05/21 0126   gabapentin (NEURONTIN) capsule 300 mg  300 mg Oral QHS Donne Hazel, MD   300 mg at 09/04/21 2059   heparin injection 5,000 Units  5,000 Units Subcutaneous Q8H Patel, Vishal  R, MD   5,000 Units at 09/05/21 0700   insulin aspart (novoLOG) injection 0-9 Units  0-9 Units Subcutaneous TID WC Lenore Cordia, MD   1 Units at 09/05/21 4403   levothyroxine (SYNTHROID) tablet 75 mcg  75 mcg Oral QAC breakfast Lenore Cordia, MD   75 mcg at 09/05/21 0700   losartan (COZAAR) tablet 50 mg  50 mg Oral Daily Zada Finders R, MD   50 mg at 09/04/21 1028   metoprolol succinate (TOPROL-XL) 24 hr tablet 50 mg  50 mg Oral Daily Zada Finders R, MD   50 mg at 09/04/21 1025   morphine 2 MG/ML injection 1 mg  1 mg Intravenous Q4H PRN Donne Hazel, MD   1 mg at 09/04/21 2059   ondansetron (ZOFRAN) tablet 4 mg  4 mg Oral Q6H PRN Lenore Cordia, MD   4 mg at 09/03/21 2141   Or   ondansetron (ZOFRAN) injection 4 mg  4 mg Intravenous Q6H PRN Lenore Cordia, MD       oxyCODONE-acetaminophen (PERCOCET/ROXICET) 5-325 MG per tablet 1 tablet  1 tablet Oral Q6H PRN Donne Hazel, MD   1 tablet at 09/04/21 1031   QUEtiapine (SEROQUEL) tablet 25 mg  25 mg Oral QHS Caren Griffins, MD   25 mg at 09/04/21 2057   senna-docusate (Senokot-S) tablet 1 tablet  1 tablet Oral QHS PRN Lenore Cordia, MD       sertraline (ZOLOFT) tablet 25 mg  25 mg  Oral Daily Cinderella, Margaret A   25 mg at 09/05/21 1024   sodium chloride flush (NS) 0.9 % injection 3 mL  3 mL Intravenous Q12H Zada Finders R, MD   3 mL at 09/05/21 1024   traMADol (ULTRAM) tablet 25 mg  25 mg Oral Q12H PRN Caren Griffins, MD   25 mg at 09/03/21 1027    Allergies as of 08/31/2021   (No Known Allergies)    Family History  Problem Relation Age of Onset   Breast cancer Paternal Aunt     Social History   Socioeconomic History   Marital status: Divorced    Spouse name: Not on file   Number of children: Not on file   Years of education: Not on file   Highest education level: Not on file  Occupational History   Not on file  Tobacco Use   Smoking status: Never   Smokeless tobacco: Never  Vaping Use   Vaping Use: Never used  Substance and Sexual Activity   Alcohol use: Not Currently    Comment: rare   Drug use: No   Sexual activity: Not Currently  Other Topics Concern   Not on file  Social History Narrative   Not on file   Social Determinants of Health   Financial Resource Strain: Not on file  Food Insecurity: Not on file  Transportation Needs: Not on file  Physical Activity: Not on file  Stress: Not on file  Social Connections: Not on file  Intimate Partner Violence: Not on file    Review of Systems: As per HPI, all others negative  Physical Exam: Vital signs in last 24 hours: Temp:  [97.3 F (36.3 C)-98.3 F (36.8 C)] 97.8 F (36.6 C) (11/05 1013) Pulse Rate:  [58-63] 63 (11/05 1013) Resp:  [13-20] 13 (11/05 1013) BP: (103-127)/(52-102) 103/52 (11/05 1013) SpO2:  [89 %-99 %] 91 % (11/05 1013) Weight:  [69.6 kg] 69.6 kg (11/04 1341) Last BM Date:  09/03/21 (Per patient report) General:   Alert,  elderly, frail-appearing but NAD Head:  Normocephalic and atraumatic. Eyes:  Sclera clear, no icterus.   Conjunctiva pink. Ears:  Normal auditory acuity. Nose:  No deformity, discharge,  or lesions. Mouth:  No deformity or lesions.   Oropharynx pink & moist. Neck:  Supple; no masses or thyromegaly. Pulses:  Normal pulses noted. Extremities:  Without clubbing or edema. Neurologic:  Alert and  oriented x4;  grossly normal neurologically. Skin:  Intact without significant lesions or rashes. Cervical Nodes:  No significant cervical adenopathy. Psych:  Alert and cooperative. Normal mood and affect.   Lab Results: Recent Labs    09/03/21 0424  WBC 6.1  HGB 11.0*  HCT 35.1*  PLT 160   BMET Recent Labs    09/03/21 0424 09/04/21 0418 09/05/21 0512  NA 138 137 134*  K 3.8 4.1 4.1  CL 102 100 97*  CO2 27 28 29   GLUCOSE 130* 84 102*  BUN 20 26* 33*  CREATININE 0.66 0.92 1.14*  CALCIUM 8.3* 8.3* 8.4*   LFT Recent Labs    09/05/21 0512  PROT 6.5  ALBUMIN 2.9*  AST 9*  ALT <5  ALKPHOS 74  BILITOT 0.7   PT/INR No results for input(s): LABPROT, INR in the last 72 hours.  Studies/Results: CT HEAD WO CONTRAST (5MM)  Result Date: 09/03/2021 CLINICAL DATA:  Cerebral hemorrhage suspected EXAM: CT HEAD WITHOUT CONTRAST TECHNIQUE: Contiguous axial images were obtained from the base of the skull through the vertex without intravenous contrast. COMPARISON:  Head CT 4 days ago 08/31/2021 FINDINGS: Brain: Patient had difficulty tolerating the exam, there is motion artifact despite repeat acquisition. Allowing for this, no acute intracranial hemorrhage. No subdural collection. No hydrocephalus or midline shift. Mild periventricular chronic small vessel ischemia is grossly stable but better assessed on prior. Vascular: Limited assessment due to motion. Skull: No skull fracture. Sinuses/Orbits: No acute findings. Other: None. IMPRESSION: Motion limited exam. Allowing for this, no acute intracranial abnormality. Electronically Signed   By: Keith Rake M.D.   On: 09/03/2021 21:25   EEG adult  Result Date: 09/04/2021 Ophelia Charter     09/04/2021  6:55 PM EEG Study Report Name: Janece Laidlaw Account Number: [id number]  Attending: Ophelia Charter, M.D. Date: 09/04/2021 Duration: 22 min Patient history: 76 year old female with PD and CHF with generalized weakness. Level of alertness: awake AEDs during EEG study: alprazolam, gabapentin Technical aspects: This video-EEG study was done with 19 scalp electrodes positioned according to the 10-20 International system of electrode placement. Electrical activity was acquired at a sampling rate of 500Hz  and reviewed with a high frequency filter of 70Hz  and a low frequency filter of 1Hz . Video-EEG data were recorded continuously and digitally stored.   Software used: Natus with Scientist, physiological. BACKGROUND ACTIVITY: Posterior dominant rhythm: The posterior dominant rhythm consisted of a 6-7 Hz moderate voltage rhythm distributed symmetrically in the posterior head regions that had limited variability.  Beta: There is 15 to 18 Hz, 2-3 uV beta activity with irregular morphology distributed symmetrically and diffusely. Slowing: none Attenuation: none EPILEPTIFORM ACTIVITY: Interictal epileptiform activity: none Ictal Activity: none OTHER EVENTS: Prominent motion artifact was present throughout the majority of the recording. SLEEP RECORDINGS: Sleep architecture was not recorded. ACTIVATION PROCEDURES: Hyperventilation and photic stimulation were not performed. IMPRESSION: This study is abnormal due to mild slowing of the posterior background consistent with mild encephalopathy. No seizures or epileptiform discharges were seen throughout the recording.  DG ESOPHAGUS  W SINGLE CM (SOL OR THIN BA)  Result Date: 09/04/2021 CLINICAL DATA:  Pneumonia EXAM: ESOPHOGRAM/BARIUM SWALLOW TECHNIQUE: Single contrast examination was performed using barium. FLUOROSCOPY TIME:  Fluoroscopy Time:  1 minute 6 seconds Radiation Exposure Index (if provided by the fluoroscopic device): 8.4 mGy COMPARISON:  None. FINDINGS: There is no aspiration. There is persistent narrowing at the gastroesophageal  junction. Contrast does pass through this region but with delay. As result, there are multiple tertiary contractions proximally. Remainder of the esophagus demonstrates a normal caliber. IMPRESSION: Persistent narrowing at the gastroesophageal junction, which could reflect stricture or mass. Electronically Signed   By: Macy Mis M.D.   On: 09/04/2021 09:13    Impression:   Pneumonia.  Acute on chronic heart failure.  Dysphagia.  Plan:   Patient reports having dysphagia for several years for which she "has gotten used to it."  She had endoscopy with dilatation to 43 Maloney (16Fr) in 2020 at [facility] with no improvement.  She is actually eating well per patient and technician who was in the room at time of my visit. Based on patient's recovering heart failure and pneumonia, and given the chronicity of her dysphagia and her lack of difficulty eating at this time, I would not proceed with endoscopy during this admission.  Rather, I would have her follow-up with her gastroenterologist at [facility] clinic as outpatient.  History and imaging studies and prior negative endo for dysphagia raise specter more for motility disorder, including possible achalasia. Soft dysphagia type diet, now and upon hospital discharge. [facility] will sign-off; please call with questions; thank you for the consultation.   LOS: 4 days   Yaneli Keithley M  09/05/2021, 10:29 AM  Cell [phone] If no answer or after 5 PM call [phone]

## 2021-09-06 LAB — COMPREHENSIVE METABOLIC PANEL WITH GFR
ALT: <5 U/L (ref 0–44)
AST: 7 U/L — ABNORMAL LOW (ref 15–41)
Albumin: 2.8 g/dL — ABNORMAL LOW (ref 3.5–5.0)
Alkaline Phosphatase: 71 U/L (ref 38–126)
Anion gap: 8 (ref 5–15)
BUN: 28 mg/dL — ABNORMAL HIGH (ref 8–23)
CO2: 28 mmol/L (ref 22–32)
Calcium: 8.3 mg/dL — ABNORMAL LOW (ref 8.9–10.3)
Chloride: 98 mmol/L (ref 98–111)
Creatinine, Ser: 0.61 mg/dL (ref 0.44–1.00)
GFR, Estimated: >60 mL/min
Glucose, Bld: 105 mg/dL — ABNORMAL HIGH (ref 70–99)
Potassium: 4 mmol/L (ref 3.5–5.1)
Sodium: 134 mmol/L — ABNORMAL LOW (ref 135–145)
Total Bilirubin: 0.6 mg/dL (ref 0.3–1.2)
Total Protein: 6.5 g/dL (ref 6.5–8.1)

## 2021-09-06 LAB — GLUCOSE, CAPILLARY
Glucose-Capillary: 99 mg/dL (ref 70–99)
Glucose-Capillary: 189 mg/dL — ABNORMAL HIGH (ref 70–99)
Glucose-Capillary: 126 mg/dL — ABNORMAL HIGH (ref 70–99)

## 2021-09-06 MED ORDER — FUROSEMIDE 40 MG PO TABS: 40.0000 mg | ORAL_TABLET | Freq: Every day | ORAL | Status: DC

## 2021-09-06 MED ORDER — LIDOCAINE-PRILOCAINE 2.5-2.5 % EX CREA
TOPICAL_CREAM | Freq: Three times a day (TID) | CUTANEOUS | Status: DC | PRN
  Filled 2021-09-06: qty 5

## 2021-09-06 MED ORDER — TORSEMIDE 20 MG PO TABS
20.0000 mg | ORAL_TABLET | Freq: Two times a day (BID) | ORAL | Status: DC
  Administered 2021-09-06: 20 mg via ORAL
  Filled 2021-09-06 (×2): qty 1

## 2021-09-06 NOTE — Progress Notes (Signed)
Occupational Therapy Treatment Patient Details Name: Stephanie [name] MRN: [id number] DOB: 12/17/1945 Today's Date: 09/06/2021   History of present illness 75 year old female patient who presented to ED with AMS. patient was noted to recently be in hospital from 9/19-9/27 for CHF. PMH significant for chronic systolic CHF (most recent EF = 40-45%), LBBB, hypertension, hyperlipidemia, diabetes mellitus type 2, chronic iron deficiency anemia, Parkinson's disease.   OT comments  Patient found seated in straight back chair with daughter present drawing - attempting to use her right dominant hand. Patient reports right dominant hand pain since Wednesday after needle puncture of some form. On assessment patient's right and mildly swollen and hospital ID bracelet snug on left wrist. Patient exhibited decreased overall weak grip with functional ROM of PIPs and DIPs but decreased tolerance for ROM in MCPs. Wrist extension impaired at just past neutral. Reports of pain throughout with ROM but predominantly around thenar area. Patient tolerated gentle PROM of digits at each joint and at wrist. Therapist encouraged external wrist reps (revving a motorcycle) and provided a ball for ball squeezes to promote finger flexion. Patient then needed to use bathroom. Patient overall mod assist to rise from chair and toilet and min assist to ambulate with walker needing verbal cues to reduce festinating gait and at times for weight shifting. Patient total assist for toileting today - limited by pain in right dominant hand and reliance on walker. Will continue POC. Continue to recommend short term rehab.    Recommendations for follow up therapy are one component of a multi-disciplinary discharge planning process, led by the attending physician.  Recommendations may be updated based on patient status, additional functional criteria and insurance authorization.    Follow Up Recommendations  Skilled nursing-short term rehab (<3  hours/day)    Assistance Recommended at Discharge Frequent or constant Supervision/Assistance  Equipment Recommendations   (Defer to next venue)    Recommendations for Other Services      Precautions / Restrictions Precautions Precautions: Fall Precaution Comments: Parkinson's Restrictions Weight Bearing Restrictions: No          Balance Overall balance assessment: Needs assistance Sitting-balance support: No upper extremity supported Sitting balance-Leahy Scale: Fair     Standing balance support: During functional activity;Bilateral upper extremity supported Standing balance-Leahy Scale: Poor                             ADL either performed or assessed with clinical judgement   ADL Overall ADL's : Needs assistance/impaired                         Toilet Transfer: Regular Toilet;Rolling walker (2 wheels);Moderate assistance Toilet Transfer Details (indicate cue type and reason): min assist to control descent onto toilet. MOd assist to rise from toilet. Toileting- Clothing Manipulation and Hygiene: Total assistance;Sit to/from stand Toileting - Clothing Manipulation Details (indicate cue type and reason): Patient needing assistance for clothing management and hygiene. Patient limited by decreased motor control and stiffness of movement from Parkinson's as well as pain in right dominant hand.     Functional mobility during ADLs: Minimal assistance;Rolling walker (2 wheels) General ADL Comments: Min assist with RW to ambulate. Verbal cues for larger steps and min assist to increase weight shift and steady as needed.     Vision Patient Visual Report: No change from baseline         Exercises Other Exercises Other Exercises: assessment of  right painful hand with noted mild edmea. PROM to each digit and joint including wrist.      Frequency  Min 2X/week        Progress Toward Goals  OT Goals(current goals can now be found in the care  plan section)     Acute Rehab OT Goals Patient Stated Goal: improve pain in right hand and use of dominant hand OT Goal Formulation: With patient/family Time For Goal Achievement: 09/16/21 Potential to Achieve Goals: Good  Plan         AM-PAC OT "6 Clicks" Daily Activity     Outcome Measure   Help from another person eating meals?: A Little Help from another person taking care of personal grooming?: A Little Help from another person toileting, which includes using toliet, bedpan, or urinal?: Total Help from another person bathing (including washing, rinsing, drying)?: A Lot Help from another person to put on and taking off regular upper body clothing?: A Lot Help from another person to put on and taking off regular lower body clothing?: Total 6 Click Score: 12    End of Session Equipment Utilized During Treatment: Rolling walker (2 wheels)  OT Visit Diagnosis: Unsteadiness on feet (R26.81);Other symptoms and signs involving cognitive function;Muscle weakness (generalized) (M62.81);Pain Pain - Right/Left: Right Pain - part of body: Hand   Activity Tolerance Patient tolerated treatment well   Patient Left in chair;with call bell/phone within reach;with family/visitor present;with chair alarm set   Nurse Communication  (okay to see. REcommended changing placement of hospital wrist band.)        Time: 9191-6606 OT Time Calculation (min): 39 min  Charges: OT General Charges $OT Visit: 1 Visit OT Treatments $Self Care/Home Management : 8-22 mins $Therapeutic Exercise: 8-22 mins  Luwana Butrick, OTR/L [place]  Office [phone] Pager: [place] 09/06/2021, 10:18 AM

## 2021-09-06 NOTE — Progress Notes (Signed)
PROGRESS NOTE    Stephanie [name]  GUY:[id number] DOB: 07/05/1946 DOA: 08/31/2021 PCP: [name], MD    Brief Narrative:  75 year old female with history of systolic CHF (TTE September 2022 showed EF 25-30%), chronic hypoxia with 2 L oxygen at night, DM2, HTN, HLD, Parkinson's disease, left bundle branch block, comes into the hospital for generalized weakness.  She was recently hospitalized in September 2022 for acute decompensated heart failure, was diuresed and weight on discharge was 182 pounds.  Of note, during prior hospitalization there was concern about a possible esophageal foreign body on CT angiogram, she was evaluated by speech and considered high risk for aspiration not a good candidate for barium esophagogram.  She was able to tolerate p.o. without issues.  Complains of weakness currently, inability to walk, and increased swelling in her legs  Assessment & Plan:   Principal Problem:   Acute on chronic HFrEF (heart failure with reduced ejection fraction) (HCC) Active Problems:   Type 2 diabetes mellitus (HCC)   Hypothyroid   Parkinson's disease (HCC)   Hyperlipidemia associated with type 2 diabetes mellitus ([place])   Hypertension associated with diabetes ([place])   CAP (community acquired pneumonia)  Principal Problem Acute on chronic systolic CHF-came into the hospital with worsening dyspnea, increased edema and elevated BNP.  -Continue home medication with losartan, metoprolol -Most recent 2D echo done in September 2022 when she was admitted with fluid overload at [facility] showed an EF of 25-30%, severely decreased LV function, global hypokinesis.   -Clinically improved with IV lasix, now on hold -Have started oral diuretic -Repeat bmet in AM -Patient and family reports that patient is followed closely by cardiology as outpatient    Active Problems Lobar pneumonia, right upper lobe-chest x-ray concerning for right upper lobe pneumonia.  She definitely is at risk  for aspiration.  She has been placed on antibiotics with ceftriaxone and azithromycin, continue current antibiotics for now. -Per speech pathologist, esophagogram was recommended.  Reviewed.  Patient noted to have a persistent narrowing at the gastroesophageal junction which could reflect stricture or mass. -Appreciate input by GI. Recommendations to f/u with primary GI when pt is ultimately discharged   Acute metabolic encephalopathy- -Seems to be much improved, conversing appropriately   Lower extremity cellulitis-there is a degree of venous stasis changes however the rash looks bright red, somewhat tender to palpation and cannot exclude a degree of cellulitis.  She is covered with ceftriaxone, improved   Generalized weakness/deconditioning-normally ambulates with a walker but unable to do so in the last few days.  PT/OT consulted, thus far recommendations for skilled nursing facility recommended, TOC following, covid screening ordered   History of dysphagia-speech consulted, appreciate recommendations -Per above, esophagram reviewed with GI recs noted   Essential hypertension-continue home medications as below  Hypothyroidism-continue Synthroid as tolerated   Parkinson's disease-continue Sinemet as tolerated -resting tremor improved   Chest pain-EKG with LBBB however this is known to be chronic.   -Serial troponins reviewed, serially negative   LBBB-noted   DM2 -continue sliding scale as needed  Anxiety -Patient reports being afraid to sleep at night, citing fear that SNF may not attend to her needs in a timely manner, leading up to periods of panic.  Patient tearful when describing these feelings. -Appreciate assistance by psychiatry.  Recs noted for quetiapine and consideration for input by neurology in light of patient's history of Parkinson's disease.  DVT prophylaxis: Heparin subq Code Status: Full Family Communication: Pt in room, family currently not  at bedside  Status  is: Inpatient  Remains inpatient appropriate because: severity of ill ness   Consultants:  Psychiatry  Procedures:    Antimicrobials: Anti-infectives (From admission, onward)    Start     Dose/Rate Route Frequency Ordered Stop   09/03/21 1000  azithromycin (ZITHROMAX) tablet 500 mg        500 mg Oral Daily 09/02/21 0905     09/01/21 0100  cefTRIAXone (ROCEPHIN) 1 g in sodium chloride 0.9 % 100 mL IVPB        1 g 200 mL/hr over 30 Minutes Intravenous Every 24 hours 09/01/21 0033     09/01/21 0100  azithromycin (ZITHROMAX) 500 mg in sodium chloride 0.9 % 250 mL IVPB  Status:  Discontinued        500 mg 250 mL/hr over 60 Minutes Intravenous Every 24 hours 09/01/21 0033 09/02/21 0905   08/31/21 2345  cefTRIAXone (ROCEPHIN) 1 g in sodium chloride 0.9 % 100 mL IVPB  Status:  Discontinued        1 g 200 mL/hr over 30 Minutes Intravenous  Once 08/31/21 2332 09/01/21 0033   08/31/21 2345  azithromycin (ZITHROMAX) 500 mg in sodium chloride 0.9 % 250 mL IVPB  Status:  Discontinued        500 mg 250 mL/hr over 60 Minutes Intravenous  Once 08/31/21 2332 09/01/21 0033       Subjective: Reports R hand pain improved with emla cream  Objective: Vitals:   09/05/21 1311 09/05/21 2021 09/06/21 0500 09/06/21 0605  BP: 97/72 125/67  (!) 120/53  Pulse: 64 70  71  Resp: 20 16  12   Temp: 98.2 F (36.8 C) 97.9 F (36.6 C)  99.4 F (37.4 C)  TempSrc: Oral Oral  Oral  SpO2: 90% 95%  92%  Weight:   70.1 kg   Height:        Intake/Output Summary (Last 24 hours) at 09/06/2021 1542 Last data filed at 09/06/2021 1156 Gross per 24 hour  Intake 340 ml  Output --  Net 340 ml    Filed Weights   09/03/21 0550 09/04/21 1341 09/06/21 0500  Weight: 70 kg 69.6 kg 70.1 kg    Examination: General exam: Awake, laying in bed, in nad Respiratory system: Normal respiratory effort, no wheezing Cardiovascular system: regular rate, s1, s2 Gastrointestinal system: Soft, nondistended, positive  BS Central nervous system: CN2-12 grossly intact, strength intact Extremities: Perfused, no clubbing Skin: Normal skin turgor, no notable skin lesions seen Psychiatry: Mood normal // no visual hallucinations   Data Reviewed: I have personally reviewed following labs and imaging studies  CBC: Recent Labs  Lab 08/31/21 2223 09/01/21 0426 09/02/21 0430 09/03/21 0424  WBC 6.3 5.7 5.6 6.1  NEUTROABS 4.6  --   --   --   HGB 11.9* 11.1* 11.2* 11.0*  HCT 38.2 36.5 36.3 35.1*  MCV 84.9 86.1 84.4 83.6  PLT 175 162 150 876    Basic Metabolic Panel: Recent Labs  Lab 09/01/21 0426 09/02/21 0430 09/03/21 0424 09/04/21 0418 09/05/21 0512 09/06/21 0522  NA 139 137 138 137 134* 134*  K 3.7 3.1* 3.8 4.1 4.1 4.0  CL 106 100 102 100 97* 98  CO2 25 26 27 28 29 28   GLUCOSE 106* 101* 130* 84 102* 105*  BUN 27* 22 20 26* 33* 28*  CREATININE 0.83 0.62 0.66 0.92 1.14* 0.61  CALCIUM 8.7* 8.3* 8.3* 8.3* 8.4* 8.3*  MG 1.5* 1.5*  --  1.6* 2.4  --  GFR: Estimated Creatinine Clearance: 53 mL/min (by C-G formula based on SCr of 0.61 mg/dL). Liver Function Tests: Recent Labs  Lab 09/02/21 0430 09/03/21 0424 09/04/21 0418 09/05/21 0512 09/06/21 0522  AST 11* 11* 12* 9* 7*  ALT <5 <5 <5 <5 <5  ALKPHOS 89 85 81 74 71  BILITOT 0.9 0.6 0.9 0.7 0.6  PROT 6.9 6.7 6.6 6.5 6.5  ALBUMIN 3.2* 3.1* 3.2* 2.9* 2.8*    No results for input(s): LIPASE, AMYLASE in the last 168 hours. No results for input(s): AMMONIA in the last 168 hours. Coagulation Profile: No results for input(s): INR, PROTIME in the last 168 hours. Cardiac Enzymes: Recent Labs  Lab 09/01/21 0426  CKTOTAL 53    BNP (last 3 results) No results for input(s): PROBNP in the last 8760 hours. HbA1C: No results for input(s): HGBA1C in the last 72 hours. CBG: Recent Labs  Lab 09/05/21 1132 09/05/21 1620 09/05/21 2018 09/06/21 0732 09/06/21 1147  GLUCAP 188* 182* 195* 99 189*    Lipid Profile: No results for  input(s): CHOL, HDL, LDLCALC, TRIG, CHOLHDL, LDLDIRECT in the last 72 hours. Thyroid Function Tests: No results for input(s): TSH, T4TOTAL, FREET4, T3FREE, THYROIDAB in the last 72 hours. Anemia Panel: No results for input(s): VITAMINB12, FOLATE, FERRITIN, TIBC, IRON, RETICCTPCT in the last 72 hours. Sepsis Labs: Recent Labs  Lab 08/31/21 2330  PROCALCITON <0.10     Recent Results (from the past 240 hour(s))  Resp Panel by RT-PCR (Flu A&B, Covid) Nasopharyngeal Swab     Status: None   Collection Time: 08/31/21 10:25 PM   Specimen: Nasopharyngeal Swab; Nasopharyngeal(NP) swabs in vial transport medium  Result Value Ref Range Status   SARS Coronavirus 2 by RT PCR NEGATIVE NEGATIVE Final    Comment: (NOTE) SARS-CoV-2 target nucleic acids are NOT DETECTED.  The SARS-CoV-2 RNA is generally detectable in upper respiratory specimens during the acute phase of infection. The lowest concentration of SARS-CoV-2 viral copies this assay can detect is 138 copies/mL. A negative result does not preclude SARS-Cov-2 infection and should not be used as the sole basis for treatment or other patient management decisions. A negative result may occur with  improper specimen collection/handling, submission of specimen other than nasopharyngeal swab, presence of viral mutation(s) within the areas targeted by this assay, and inadequate number of viral copies(<138 copies/mL). A negative result must be combined with clinical observations, patient history, and epidemiological information. The expected result is Negative.  Fact Sheet for Patients:  EntrepreneurPulse.com.au  Fact Sheet for Healthcare Providers:  IncredibleEmployment.be  This test is no t yet approved or cleared by the [place] FDA and  has been authorized for detection and/or diagnosis of SARS-CoV-2 by FDA under an Emergency Use Authorization (EUA). This EUA will remain  in effect (meaning this  test can be used) for the duration of the COVID-19 declaration under Section 564(b)(1) of the Act, 21 U.S.C.section 360bbb-3(b)(1), unless the authorization is terminated  or revoked sooner.       Influenza A by PCR NEGATIVE NEGATIVE Final   Influenza B by PCR NEGATIVE NEGATIVE Final    Comment: (NOTE) The Xpert Xpress SARS-CoV-2/FLU/RSV plus assay is intended as an aid in the diagnosis of influenza from Nasopharyngeal swab specimens and should not be used as a sole basis for treatment. Nasal washings and aspirates are unacceptable for Xpert Xpress SARS-CoV-2/FLU/RSV testing.  Fact Sheet for Patients: EntrepreneurPulse.com.au  Fact Sheet for Healthcare Providers: IncredibleEmployment.be  This test is not yet approved or cleared  by the [place] and has been authorized for detection and/or diagnosis of SARS-CoV-2 by FDA under an Emergency Use Authorization (EUA). This EUA will remain in effect (meaning this test can be used) for the duration of the COVID-19 declaration under Section 564(b)(1) of the Act, 21 U.S.C. section 360bbb-3(b)(1), unless the authorization is terminated or revoked.  Performed at [facility], [place] [address], [place], [place] [address]   Urine Culture     Status: Abnormal   Collection Time: 08/31/21 11:33 PM   Specimen: Urine, Clean Catch  Result Value Ref Range Status   Specimen Description   Final    URINE, CLEAN CATCH Performed at [facility], [place] [address] [place], [place] [address]    Special Requests   Final    NONE Performed at [facility], [place] [address] [place], [place] [address]    Culture MULTIPLE SPECIES PRESENT, SUGGEST RECOLLECTION (A)  Final   Report Status 09/01/2021 FINAL  Final      Radiology Studies: EEG adult  Result Date: 2021/09/05 Stephanie [name]     09-05-2021  6:55 PM EEG Study Report Name: Narjis Mira Account  Number: [id number] Attending: Ophelia [name], M.D. Date: 09-05-21 Duration: 22 min Patient history: 75 year old female with PD and CHF with generalized weakness. Level of alertness: awake AEDs during EEG study: alprazolam, gabapentin Technical aspects: This video-EEG study was done with 19 scalp electrodes positioned according to the 10-20 International system of electrode placement. Electrical activity was acquired at a sampling rate of 500Hz  and reviewed with a high frequency filter of 70Hz  and a low frequency filter of 1Hz . Video-EEG data were recorded continuously and digitally stored.   Software used: Natus with Scientist, physiological. BACKGROUND ACTIVITY: Posterior dominant rhythm: The posterior dominant rhythm consisted of a 6-7 Hz moderate voltage rhythm distributed symmetrically in the posterior head regions that had limited variability.  Beta: There is 15 to 18 Hz, 2-3 uV beta activity with irregular morphology distributed symmetrically and diffusely. Slowing: none Attenuation: none EPILEPTIFORM ACTIVITY: Interictal epileptiform activity: none Ictal Activity: none OTHER EVENTS: Prominent motion artifact was present throughout the majority of the recording. SLEEP RECORDINGS: Sleep architecture was not recorded. ACTIVATION PROCEDURES: Hyperventilation and photic stimulation were not performed. IMPRESSION: This study is abnormal due to mild slowing of the posterior background consistent with mild encephalopathy. No seizures or epileptiform discharges were seen throughout the recording.   Scheduled Meds:  allopurinol  100 mg Oral Daily   azithromycin  500 mg Oral Daily   carbidopa-levodopa  1 tablet Oral QHS   carbidopa-levodopa  2.5 tablet Oral QID   gabapentin  300 mg Oral QHS   heparin  5,000 Units Subcutaneous Q8H   insulin aspart  0-9 Units Subcutaneous TID WC   levothyroxine  75 mcg Oral QAC breakfast   losartan  50 mg Oral Daily   metoprolol succinate  50 mg Oral Daily    QUEtiapine  25 mg Oral QHS   sertraline  25 mg Oral Daily   sodium chloride flush  3 mL Intravenous Q12H   torsemide  20 mg Oral BID   Continuous Infusions:  sodium chloride Stopped (09/02/21 0417)   cefTRIAXone (ROCEPHIN)  IV Stopped (09/06/21 0053)     LOS: 5 days   Stephanie Lund, MD [facility] Pager On Amion  If 7PM-7AM, please contact night-coverage 09/06/2021, 3:42 PM

## 2021-09-06 NOTE — TOC Progression Note (Signed)
Transition of Care [facility]) - Progression Note    Patient Details  Name: Stephanie [name] MRN: [id number] Date of Birth: October 14, 1946  Transition of Care [facility]) CM/SW Contact  Ross Ludwig, [place] Phone Number: 09/06/2021, 4:56 PM  Clinical Narrative:    [facility] accepted patient pending negative Covid test.  [facility] can accept patient on Monday if medically ready for discharge.   Expected Discharge Plan: [place] Barriers to Discharge: Continued Medical Work up  Expected Discharge Plan and Services Expected Discharge Plan: [place]   Discharge Planning Services: CM Consult   Living arrangements for the past 2 months: Single Family Home                                       Social Determinants of Health (SDOH) Interventions    Readmission Risk Interventions Readmission Risk Prevention Plan 09/03/2021  Transportation Screening Complete  PCP or Specialist Appt within 3-5 Days Complete  HRI or [place] Complete  Social Work Consult for [place] Planning/Counseling Complete  Palliative Care Screening Not Applicable  Medication Review Press photographer) Complete  Some recent data might be hidden

## 2021-09-07 LAB — SARS CORONAVIRUS 2 (TAT 6-24 HRS): SARS Coronavirus 2: NEGATIVE

## 2021-09-07 LAB — RESP PANEL BY RT-PCR (FLU A&B, COVID) ARPGX2
Influenza A by PCR: NEGATIVE
Influenza B by PCR: NEGATIVE
SARS Coronavirus 2 by RT PCR: NEGATIVE

## 2021-09-07 LAB — COMPREHENSIVE METABOLIC PANEL
ALT: 5 U/L (ref 0–44)
AST: 13 U/L — ABNORMAL LOW (ref 15–41)
Albumin: 2.9 g/dL — ABNORMAL LOW (ref 3.5–5.0)
Alkaline Phosphatase: 71 U/L (ref 38–126)
Anion gap: 9 (ref 5–15)
BUN: 35 mg/dL — ABNORMAL HIGH (ref 8–23)
CO2: 29 mmol/L (ref 22–32)
Calcium: 8.8 mg/dL — ABNORMAL LOW (ref 8.9–10.3)
Chloride: 97 mmol/L — ABNORMAL LOW (ref 98–111)
Creatinine, Ser: 1.1 mg/dL — ABNORMAL HIGH (ref 0.44–1.00)
GFR, Estimated: 52 mL/min — ABNORMAL LOW (ref >60)
Glucose, Bld: 132 mg/dL — ABNORMAL HIGH (ref 70–99)
Potassium: 4.5 mmol/L (ref 3.5–5.1)
Sodium: 135 mmol/L (ref 135–145)
Total Bilirubin: 0.5 mg/dL (ref 0.3–1.2)
Total Protein: 7.1 g/dL (ref 6.5–8.1)

## 2021-09-07 LAB — GLUCOSE, CAPILLARY
Glucose-Capillary: 105 mg/dL — ABNORMAL HIGH (ref 70–99)
Glucose-Capillary: 284 mg/dL — ABNORMAL HIGH (ref 70–99)

## 2021-09-07 MED ORDER — OXYCODONE-ACETAMINOPHEN 5-325 MG PO TABS: 1.0000 | ORAL_TABLET | Freq: Four times a day (QID) | ORAL | 0 refills | Status: DC | PRN

## 2021-09-07 MED ORDER — QUETIAPINE FUMARATE 25 MG PO TABS: 25.0000 mg | ORAL_TABLET | Freq: Every day | ORAL | 0 refills | Status: DC

## 2021-09-07 MED ORDER — SERTRALINE HCL 25 MG PO TABS: 25.0000 mg | ORAL_TABLET | Freq: Every day | ORAL | 0 refills | Status: DC

## 2021-09-07 MED ORDER — FUROSEMIDE 40 MG PO TABS: 40.0000 mg | ORAL_TABLET | Freq: Every day | ORAL | 0 refills | Status: DC

## 2021-09-07 NOTE — TOC Transition Note (Addendum)
Transition of Care [facility]) - CM/SW Discharge Note   Patient Details  Name: [name] MRN: [id number] Date of Birth: 1945/11/06  Transition [facility] Care [facility]) CM/SW Contact:  Dessa Phi, RN Phone Number: 09/07/2021, 1:46 PM   Clinical Narrative:  d/c today to [facility] SN[facility]-awaiting d/c summary prior rm#,tel# for report, & PTAR.  1:52p-PTAR called d/c summary sent to [facility]-going to rm#102,nsg call report tel#[phone]. PTAR forms @ nsg station.PTAR called. No further CM needs.    Final next level of care: Skilled Nursing Facility Barriers to Discharge: No Barriers Identified   Patient Goals and CMS Choice Patient states their goals for this hospitalization and ongoing recovery are:: go home or rehab CMS Medicare.gov Compare Post Acute Care list provided to:: Patient Represenative (must comment) (donna dtr [phone]) Choice offered to / list presented to : Adult Children, Patient  Discharge Placement PASRR number recieved: 08/31/21            Patient chooses bed at: [facility] Patient to be transferred to facility by: [place] Name of family member notified: Butch [facility] d[facility] [phone] Patient and family notified of of transfer: 09/07/21  Discharge Plan and Services   Discharge Planning Services: CM Consult                                 Social Determinants of Health ([place]) Interventions     Readmission Risk Interventions Readmission Risk Prevention Plan 09/03/2021  Transportation Screening Complete  PCP or Specialist Appt within 3-5 Days Complete  HRI or [place] Complete  Social Work Consult for [place] Planning/Counseling Complete  Palliative Care Screening Not Applicable  Medication Review Press photographer) Complete  Some recent data might be hidden

## 2021-09-07 NOTE — TOC Progression Note (Signed)
Transition of Care [facility]) - Progression Note    Patient Details  Name: Stephanie [name] MRN: [id number] Date of Birth: 04-15-1946  Transition of Care [facility]) CM/SW Contact  Omer Monter, Juliann Pulse, RN Phone Number: 09/07/2021, 10:49 AM  Clinical Narrative:[facility] bed available-awaiting covid results. MD notified.      Expected Discharge Plan: Skilled Nursing Facility Barriers to Discharge: Other (must enter comment) (awaiting covid test results.)  Expected Discharge Plan and Services Expected Discharge Plan: [place]   Discharge Planning Services: CM Consult   Living arrangements for the past 2 months: Single Family Home                                       Social Determinants of Health (SDOH) Interventions    Readmission Risk Interventions Readmission Risk Prevention Plan 09/03/2021  Transportation Screening Complete  PCP or Specialist Appt within 3-5 Days Complete  HRI or [place] Complete  Social Work Consult for [place] Planning/Counseling Complete  Palliative Care Screening Not Applicable  Medication Review Press photographer) Complete  Some recent data might be hidden

## 2021-09-07 NOTE — Progress Notes (Signed)
Called report to [employer] at [place]. No questions at this time.

## 2021-09-07 NOTE — Discharge Summary (Signed)
Physician Discharge Summary  Stephanie [name] CWC:[id number] DOB: 29-Oct-1946 DOA: 08/31/2021  PCP: [name], MD  Admit date: 08/31/2021 Discharge date: 09/07/2021  Admitted From: Home Disposition:  SNF  Recommendations for Outpatient Follow-up:  Follow up with PCP in 1-2 weeks Psychiatry recommends increasing sertraline in one week Repeat bmet in 1-2 weeks  Discharge Condition:Improved CODE STATUS:Full Diet recommendation: Diabetic, heart healthy   Brief/Interim Summary: 75 year old female with history of systolic CHF (TTE September 2022 showed EF 25-30%), chronic hypoxia with 2 L oxygen at night, DM2, HTN, HLD, Parkinson's disease, left bundle branch block, comes into the hospital for generalized weakness.  She was recently hospitalized in September 2022 for acute decompensated heart failure, was diuresed and weight on discharge was 182 pounds.  Of note, during prior hospitalization there was concern about a possible esophageal foreign body on CT angiogram, she was evaluated by speech and considered high risk for aspiration not a good candidate for barium esophagogram.  She was able to tolerate p.o. without issues.  Complains of weakness currently, inability to walk, and increased swelling in her legs  Discharge Diagnoses:  Principal Problem:   Acute on chronic HFrEF (heart failure with reduced ejection fraction) (HCC) Active Problems:   Type 2 diabetes mellitus ([place])   Hypothyroid   Parkinson's disease ([place])   Hyperlipidemia associated with type 2 diabetes mellitus ([place])   Hypertension associated with diabetes ([place])   CAP (community acquired pneumonia)  Principal Problem Acute on chronic systolic CHF-came into the hospital with worsening dyspnea, increased edema and elevated BNP.  -Continue home medication with losartan, metoprolol -Most recent 2D echo done in September 2022 when she was admitted with fluid overload at [facility] showed an EF of 25-30%, severely  decreased LV function, global hypokinesis.   -Clinically improved with IV lasix, now on hold -Have started oral diuretic -Patient and family reports that patient is followed closely by cardiology as outpatient. Have messaged and updated patient's primary Cardiologist -Recommend repeat bmet in 1 week   Active Problems Lobar pneumonia, right upper lobe-chest x-ray concerning for right upper lobe pneumonia.  She definitely is at risk for aspiration.  She has completed ceftriaxone and azithromycin -Per speech pathologist, esophagogram was recommended.  Reviewed.  Patient noted to have a persistent narrowing at the gastroesophageal junction which could reflect stricture or mass. -Appreciate input by GI. Recommendations to f/u with primary GI when pt is ultimately discharged   Acute metabolic encephalopathy- -Seems to be much improved, conversing appropriately   Lower extremity cellulitis-there is a degree of venous stasis changes however the rash looks bright red, somewhat tender to palpation and cannot exclude a degree of cellulitis.  Completed course of abx   Generalized weakness/deconditioning-normally ambulates with a walker but unable to do so in the last few days.  PT/OT consulted, thus far recommendations for skilled nursing facility recommended, TOC following, covid screening ordered   History of dysphagia-speech consulted, appreciate recommendations -Per above, esophagram reviewed with GI recs noted   Essential hypertension-continue home medications as below  Hypothyroidism-continue Synthroid as tolerated   Parkinson's disease-continue Sinemet as tolerated -resting tremor stable   Chest pain-EKG with LBBB however this is known to be chronic.   -Serial troponins reviewed, serially negative   LBBB-noted   DM2 -continue sliding scale as needed -Cont home medications on d/c   Anxiety -Appreciate assistance by psychiatry.   -Recommendations for sertraline 25mg  and consider  increasing in 1 week -Psychiatry recs to continue quetiapine 25mg  QHS -Psychiatry recommendations to  repeat BMP as outpatient   Discharge Instructions   Allergies as of 09/07/2021   No Known Allergies      Medication List     STOP taking these medications    OXYGEN   pramipexole 0.125 MG tablet Commonly known as: MIRAPEX       TAKE these medications    allopurinol 100 MG tablet Commonly known as: ZYLOPRIM Take 100 mg by mouth daily.   carbidopa-levodopa 25-100 MG tablet Commonly known as: SINEMET IR Take 2.5 tablets by mouth 4 (four) times daily.   carbidopa-levodopa 50-200 MG tablet Commonly known as: SINEMET CR Take 1 tablet by mouth at bedtime.   docusate sodium 100 MG capsule Commonly known as: COLACE Take 100 mg by mouth 2 (two) times daily as needed for mild constipation.   fluticasone 50 MCG/ACT nasal spray Commonly known as: FLONASE Place 1-2 sprays into the nose daily as needed for allergies.   FreeStyle Libre 14 Day Sensor Misc by Does not apply route.   gabapentin 300 MG capsule Commonly known as: NEURONTIN Take 300 mg by mouth at bedtime.   glucose 4 GM chewable tablet Chew 1 tablet by mouth as needed for low blood sugar.   levothyroxine 75 MCG tablet Commonly known as: SYNTHROID Take 75 mcg by mouth daily. What changed: Another medication with the same name was removed. Continue taking this medication, and follow the directions you see here.   losartan 50 MG tablet Commonly known as: COZAAR Take 1 tablet (50 mg total) by mouth daily.   metFORMIN 1000 MG tablet Commonly known as: GLUCOPHAGE Take 1,000 mg by mouth 2 (two) times daily with a meal.   metoprolol succinate 50 MG 24 hr tablet Commonly known as: TOPROL-XL Take 50 mg by mouth daily. Take with or immediately following a meal.   multivitamin with minerals Tabs tablet Take 1 tablet by mouth daily.   oxybutynin 10 MG 24 hr tablet Commonly known as: DITROPAN-XL Take 10 mg  by mouth daily.   oxyCODONE-acetaminophen 5-325 MG tablet Commonly known as: PERCOCET/ROXICET Take 1 tablet by mouth every 6 (six) hours as needed for severe pain.   pioglitazone 30 MG tablet Commonly known as: ACTOS Take 30 mg by mouth daily.   pravastatin 40 MG tablet Commonly known as: PRAVACHOL Take 40 mg by mouth daily.   QUEtiapine 25 MG tablet Commonly known as: SEROQUEL Take 1 tablet (25 mg total) by mouth at bedtime. What changed: additional instructions   sertraline 25 MG tablet Commonly known as: ZOLOFT Take 1 tablet (25 mg total) by mouth daily. Start taking on: September 08, 2021   vitamin B-12 500 MCG tablet Commonly known as: CYANOCOBALAMIN Take 500 mcg by mouth daily.        Contact information for after-discharge care     Destination     [facility] .   Service: Assisted Living Contact information: [address] [place] [phone]                    No Known Allergies  Consultations: Psychiatry  Procedures/Studies: DG Chest 2 View  Result Date: 08/31/2021 CLINICAL DATA:  chest pain EXAM: CHEST - 2 VIEW COMPARISON:  Chest x-ray 07/24/2021, CT chest 07/22/2021 FINDINGS: The heart and mediastinal contours are unchanged. Lingular linear atelectasis versus scarring. Question developing patchy airspace opacity within the right upper lobe. Similar appearing coarsened interstitial markings with no overt pulmonary edema. No pleural effusion. No pneumothorax. No  acute osseous abnormality. IMPRESSION: Question developing patchy airspace opacity within the right upper lobe. Electronically Signed   By: Iven Finn M.D.   On: 08/31/2021 21:44   CT HEAD WO CONTRAST (5MM)  Result Date: 09/03/2021 CLINICAL DATA:  Cerebral hemorrhage suspected EXAM: CT HEAD WITHOUT CONTRAST TECHNIQUE: Contiguous axial images were obtained from the base of the skull through the vertex without  intravenous contrast. COMPARISON:  Head CT 4 days ago 08/31/2021 FINDINGS: Brain: Patient had difficulty tolerating the exam, there is motion artifact despite repeat acquisition. Allowing for this, no acute intracranial hemorrhage. No subdural collection. No hydrocephalus or midline shift. Mild periventricular chronic small vessel ischemia is grossly stable but better assessed on prior. Vascular: Limited assessment due to motion. Skull: No skull fracture. Sinuses/Orbits: No acute findings. Other: None. IMPRESSION: Motion limited exam. Allowing for this, no acute intracranial abnormality. Electronically Signed   By: Keith Rake M.D.   On: 09/03/2021 21:25   CT HEAD WO CONTRAST (5MM)  Result Date: 08/31/2021 CLINICAL DATA:  Headache EXAM: CT HEAD WITHOUT CONTRAST TECHNIQUE: Contiguous axial images were obtained from the base of the skull through the vertex without intravenous contrast. COMPARISON:  07/06/2021 FINDINGS: Brain: No evidence of acute infarction, hemorrhage, hydrocephalus, extra-axial collection or mass lesion/mass effect. Subcortical white matter and periventricular small vessel ischemic changes. Vascular: Intracranial atherosclerosis. Skull: Normal. Negative for fracture or focal lesion. Sinuses/Orbits: The visualized paranasal sinuses are essentially clear. The mastoid air cells are unopacified. Other: None. IMPRESSION: No evidence of acute intracranial abnormality. Small vessel ischemic changes. Electronically Signed   By: Julian Hy M.D.   On: 08/31/2021 22:02   EEG adult  Result Date: 09/04/2021 Ophelia Charter     09/04/2021  6:55 PM EEG Study Report Name: Ena Demary Account Number: [id number] Attending: Ophelia Charter, M.D. Date: 09/04/2021 Duration: 22 min Patient history: 75 year old female with PD and CHF with generalized weakness. Level of alertness: awake AEDs during EEG study: alprazolam, gabapentin Technical aspects: This video-EEG study was done with 19 scalp  electrodes positioned according to the 10-20 International system of electrode placement. Electrical activity was acquired at a sampling rate of 500Hz  and reviewed with a high frequency filter of 70Hz  and a low frequency filter of 1Hz . Video-EEG data were recorded continuously and digitally stored.   Software used: Natus with Scientist, physiological. BACKGROUND ACTIVITY: Posterior dominant rhythm: The posterior dominant rhythm consisted of a 6-7 Hz moderate voltage rhythm distributed symmetrically in the posterior head regions that had limited variability.  Beta: There is 15 to 18 Hz, 2-3 uV beta activity with irregular morphology distributed symmetrically and diffusely. Slowing: none Attenuation: none EPILEPTIFORM ACTIVITY: Interictal epileptiform activity: none Ictal Activity: none OTHER EVENTS: Prominent motion artifact was present throughout the majority of the recording. SLEEP RECORDINGS: Sleep architecture was not recorded. ACTIVATION PROCEDURES: Hyperventilation and photic stimulation were not performed. IMPRESSION: This study is abnormal due to mild slowing of the posterior background consistent with mild encephalopathy. No seizures or epileptiform discharges were seen throughout the recording.  DG ESOPHAGUS W SINGLE CM (SOL OR THIN BA)  Result Date: 09/04/2021 CLINICAL DATA:  Pneumonia EXAM: ESOPHOGRAM/BARIUM SWALLOW TECHNIQUE: Single contrast examination was performed using barium. FLUOROSCOPY TIME:  Fluoroscopy Time:  1 minute 6 seconds Radiation Exposure Index (if provided by the fluoroscopic device): 8.4 mGy COMPARISON:  None. FINDINGS: There is no aspiration. There is persistent narrowing at the gastroesophageal junction. Contrast does pass through this region but with delay. As result, there are multiple tertiary contractions  proximally. Remainder of the esophagus demonstrates a normal caliber. IMPRESSION: Persistent narrowing at the gastroesophageal junction, which could reflect  stricture or mass. Electronically Signed   By: Macy Mis M.D.   On: 09/04/2021 09:13    Subjective: Eager to start therapy  Discharge Exam: Vitals:   09/07/21 0700 09/07/21 1125  BP: 135/82 95/63  Pulse: 64 72  Resp: 20 14  Temp: 98 F (36.7 C) (!) 97.5 F (36.4 C)  SpO2: 99% 96%   Vitals:   09/06/21 2253 09/07/21 0600 09/07/21 0700 09/07/21 1125  BP: (!) 113/51  135/82 95/63  Pulse: 66  64 72  Resp: 20  20 14   Temp: 98.1 F (36.7 C)  98 F (36.7 C) (!) 97.5 F (36.4 C)  TempSrc: Oral     SpO2: 96%  99% 96%  Weight:  73.7 kg    Height:        General: Pt is alert, awake, not in acute distress Cardiovascular: RRR, S1/S2  Respiratory: CTA bilaterally, no wheezing, no rhonchi Abdominal: Soft, NT, ND, bowel sounds + Extremities: no edema, no cyanosis   The results of significant diagnostics from this hospitalization (including imaging, microbiology, ancillary and laboratory) are listed below for reference.     Microbiology: Recent Results (from the past 240 hour(s))  Resp Panel by RT-PCR (Flu A&B, Covid) Nasopharyngeal Swab     Status: None   Collection Time: 08/31/21 10:25 PM   Specimen: Nasopharyngeal Swab; Nasopharyngeal(NP) swabs in vial transport medium  Result Value Ref Range Status   SARS Coronavirus 2 by RT PCR NEGATIVE NEGATIVE Final    Comment: (NOTE) SARS-CoV-2 target nucleic acids are NOT DETECTED.  The SARS-CoV-2 RNA is generally detectable in upper respiratory specimens during the acute phase of infection. The lowest concentration of SARS-CoV-2 viral copies this assay can detect is 138 copies/mL. A negative result does not preclude SARS-Cov-2 infection and should not be used as the sole basis for treatment or other patient management decisions. A negative result may occur with  improper specimen collection/handling, submission of specimen other than nasopharyngeal swab, presence of viral mutation(s) within the areas targeted by this assay, and  inadequate number of viral copies(<138 copies/mL). A negative result must be combined with clinical observations, patient history, and epidemiological information. The expected result is Negative.  Fact Sheet for Patients:  EntrepreneurPulse.com.au  Fact Sheet for Healthcare Providers:  IncredibleEmployment.be  This test is no t yet approved or cleared by the [place] FDA and  has been authorized for detection and/or diagnosis of SARS-CoV-2 by FDA under an Emergency Use Authorization (EUA). This EUA will remain  in effect (meaning this test can be used) for the duration of the COVID-19 declaration under Section 564(b)(1) of the Act, 21 U.S.C.section 360bbb-3(b)(1), unless the authorization is terminated  or revoked sooner.       Influenza A by PCR NEGATIVE NEGATIVE Final   Influenza B by PCR NEGATIVE NEGATIVE Final    Comment: (NOTE) The Xpert Xpress SARS-CoV-2/FLU/RSV plus assay is intended as an aid in the diagnosis of influenza from Nasopharyngeal swab specimens and should not be used as a sole basis for treatment. Nasal washings and aspirates are unacceptable for Xpert Xpress SARS-CoV-2/FLU/RSV testing.  Fact Sheet for Patients: EntrepreneurPulse.com.au  Fact Sheet for Healthcare Providers: IncredibleEmployment.be  This test is not yet approved or cleared by the [place] FDA and has been authorized for detection and/or diagnosis of SARS-CoV-2 by FDA under an Emergency Use Authorization (EUA). This EUA will  remain in effect (meaning this test can be used) for the duration of the COVID-19 declaration under Section 564(b)(1) of the Act, 21 U.S.C. section 360bbb-3(b)(1), unless the authorization is terminated or revoked.  Performed at [facility], [place] [address], [place], [place] [address]   Urine Culture     Status: Abnormal   Collection Time: 08/31/21 11:33 PM    Specimen: Urine, Clean Catch  Result Value Ref Range Status   Specimen Description   Final    URINE, CLEAN CATCH Performed at [facility], [place] [address], [place], [place] [address]    Special Requests   Final    NONE Performed at [facility], [place] [address], [place], [place] [address]    Culture MULTIPLE SPECIES PRESENT, SUGGEST RECOLLECTION (A)  Final   Report Status 09/01/2021 FINAL  Final  SARS CORONAVIRUS 2 (TAT 6-24 HRS) Nasopharyngeal Nasopharyngeal Swab     Status: None   Collection Time: 09/07/21  4:40 AM   Specimen: Nasopharyngeal Swab  Result Value Ref Range Status   SARS Coronavirus 2 NEGATIVE NEGATIVE Final    Comment: (NOTE) SARS-CoV-2 target nucleic acids are NOT DETECTED.  The SARS-CoV-2 RNA is generally detectable in upper and lower respiratory specimens during the acute phase of infection. Negative results do not preclude SARS-CoV-2 infection, do not rule out co-infections with other pathogens, and should not be used as the sole basis for treatment or other patient management decisions. Negative results must be combined with clinical observations, patient history, and epidemiological information. The expected result is Negative.  Fact Sheet for Patients: SugarRoll.be  Fact Sheet for Healthcare Providers: https://www.woods-mathews.com/  This test is not yet approved or cleared by the [place] FDA and  has been authorized for detection and/or diagnosis of SARS-CoV-2 by FDA under an Emergency Use Authorization (EUA). This EUA will remain  in effect (meaning this test can be used) for the duration of the COVID-19 declaration under Se ction 564(b)(1) of the Act, 21 U.S.C. section 360bbb-3(b)(1), unless the authorization is terminated or revoked sooner.  Performed at [facility], [place] [address], [place], [place] [address]   Resp Panel by RT-PCR (Flu A&B, Covid)  Nasopharyngeal Swab     Status: None   Collection Time: 09/07/21 11:15 AM   Specimen: Nasopharyngeal Swab; Nasopharyngeal(NP) swabs in vial transport medium  Result Value Ref Range Status   SARS Coronavirus 2 by RT PCR NEGATIVE NEGATIVE Final    Comment: (NOTE) SARS-CoV-2 target nucleic acids are NOT DETECTED.  The SARS-CoV-2 RNA is generally detectable in upper respiratory specimens during the acute phase of infection. The lowest concentration of SARS-CoV-2 viral copies this assay can detect is 138 copies/mL. A negative result does not preclude SARS-Cov-2 infection and should not be used as the sole basis for treatment or other patient management decisions. A negative result may occur with  improper specimen collection/handling, submission of specimen other than nasopharyngeal swab, presence of viral mutation(s) within the areas targeted by this assay, and inadequate number of viral copies(<138 copies/mL). A negative result must be combined with clinical observations, patient history, and epidemiological information. The expected result is Negative.  Fact Sheet for Patients:  EntrepreneurPulse.com.au  Fact Sheet for Healthcare Providers:  IncredibleEmployment.be  This test is no t yet approved or cleared by the [place] FDA and  has been authorized for detection and/or diagnosis of SARS-CoV-2 by FDA under an Emergency Use Authorization (EUA). This EUA will remain  in effect (  meaning this test can be used) for the duration of the COVID-19 declaration under Section 564(b)(1) of the Act, 21 U.S.C.section 360bbb-3(b)(1), unless the authorization is terminated  or revoked sooner.       Influenza A by PCR NEGATIVE NEGATIVE Final   Influenza B by PCR NEGATIVE NEGATIVE Final    Comment: (NOTE) The Xpert Xpress SARS-CoV-2/FLU/RSV plus assay is intended as an aid in the diagnosis of influenza from Nasopharyngeal swab specimens and should not be  used as a sole basis for treatment. Nasal washings and aspirates are unacceptable for Xpert Xpress SARS-CoV-2/FLU/RSV testing.  Fact Sheet for Patients: EntrepreneurPulse.com.au  Fact Sheet for Healthcare Providers: IncredibleEmployment.be  This test is not yet approved or cleared by the [place] FDA and has been authorized for detection and/or diagnosis of SARS-CoV-2 by FDA under an Emergency Use Authorization (EUA). This EUA will remain in effect (meaning this test can be used) for the duration of the COVID-19 declaration under Section 564(b)(1) of the Act, 21 U.S.C. section 360bbb-3(b)(1), unless the authorization is terminated or revoked.  Performed at [facility], [place] [address], [place], [place] [address]      Labs: BNP (last 3 results) Recent Labs    07/06/21 2223 07/20/21 1824 08/31/21 2224  BNP 680.6* 980.6* 350.0*   Basic Metabolic Panel: Recent Labs  Lab 09/01/21 0426 09/02/21 0430 09/03/21 0424 09/04/21 0418 09/05/21 0512 09/06/21 0522 09/07/21 0421  NA 139 137 138 137 134* 134* 135  K 3.7 3.1* 3.8 4.1 4.1 4.0 4.5  CL 106 100 102 100 97* 98 97*  CO2 25 26 27 28 29 28 29   GLUCOSE 106* 101* 130* 84 102* 105* 132*  BUN 27* 22 20 26* 33* 28* 35*  CREATININE 0.83 0.62 0.66 0.92 1.14* 0.61 1.10*  CALCIUM 8.7* 8.3* 8.3* 8.3* 8.4* 8.3* 8.8*  MG 1.5* 1.5*  --  1.6* 2.4  --   --    Liver Function Tests: Recent Labs  Lab 09/03/21 0424 09/04/21 0418 09/05/21 0512 09/06/21 0522 09/07/21 0421  AST 11* 12* 9* 7* 13*  ALT <5 <5 <5 <5 5  ALKPHOS 85 81 74 71 71  BILITOT 0.6 0.9 0.7 0.6 0.5  PROT 6.7 6.6 6.5 6.5 7.1  ALBUMIN 3.1* 3.2* 2.9* 2.8* 2.9*   No results for input(s): LIPASE, AMYLASE in the last 168 hours. No results for input(s): AMMONIA in the last 168 hours. CBC: Recent Labs  Lab 08/31/21 2223 09/01/21 0426 09/02/21 0430 09/03/21 0424  WBC 6.3 5.7 5.6 6.1  NEUTROABS 4.6  --    --   --   HGB 11.9* 11.1* 11.2* 11.0*  HCT 38.2 36.5 36.3 35.1*  MCV 84.9 86.1 84.4 83.6  PLT 175 162 150 160   Cardiac Enzymes: Recent Labs  Lab 09/01/21 0426  CKTOTAL 53   BNP: Invalid input(s): POCBNP CBG: Recent Labs  Lab 09/06/21 0732 09/06/21 1147 09/06/21 1640 09/07/21 0739 09/07/21 1122  GLUCAP 99 189* 126* 105* 284*   D-Dimer No results for input(s): DDIMER in the last 72 hours. Hgb A1c No results for input(s): HGBA1C in the last 72 hours. Lipid Profile No results for input(s): CHOL, HDL, LDLCALC, TRIG, CHOLHDL, LDLDIRECT in the last 72 hours. Thyroid function studies No results for input(s): TSH, T4TOTAL, T3FREE, THYROIDAB in the last 72 hours.  Invalid input(s): FREET3 Anemia work up No results for input(s): VITAMINB12, FOLATE, FERRITIN, TIBC, IRON, RETICCTPCT in the last 72 hours. Urinalysis    Component Value Date/Time  COLORURINE YELLOW 08/31/2021 2333   APPEARANCEUR HAZY (A) 08/31/2021 2333   LABSPEC 1.024 08/31/2021 2333   PHURINE 5.0 08/31/2021 2333   GLUCOSEU NEGATIVE 08/31/2021 2333   HGBUR NEGATIVE 08/31/2021 2333   BILIRUBINUR NEGATIVE 08/31/2021 2333   KETONESUR 5 (A) 08/31/2021 2333   PROTEINUR 100 (A) 08/31/2021 2333   NITRITE NEGATIVE 08/31/2021 2333   LEUKOCYTESUR NEGATIVE 08/31/2021 2333   Sepsis Labs Invalid input(s): PROCALCITONIN,  WBC,  LACTICIDVEN Microbiology Recent Results (from the past 240 hour(s))  Resp Panel by RT-PCR (Flu A&B, Covid) Nasopharyngeal Swab     Status: None   Collection Time: 08/31/21 10:25 PM   Specimen: Nasopharyngeal Swab; Nasopharyngeal(NP) swabs in vial transport medium  Result Value Ref Range Status   SARS Coronavirus 2 by RT PCR NEGATIVE NEGATIVE Final    Comment: (NOTE) SARS-CoV-2 target nucleic acids are NOT DETECTED.  The SARS-CoV-2 RNA is generally detectable in upper respiratory specimens during the acute phase of infection. The lowest concentration of SARS-CoV-2 viral copies this assay  can detect is 138 copies/mL. A negative result does not preclude SARS-Cov-2 infection and should not be used as the sole basis for treatment or other patient management decisions. A negative result may occur with  improper specimen collection/handling, submission of specimen other than nasopharyngeal swab, presence of viral mutation(s) within the areas targeted by this assay, and inadequate number of viral copies(<138 copies/mL). A negative result must be combined with clinical observations, patient history, and epidemiological information. The expected result is Negative.  Fact Sheet for Patients:  EntrepreneurPulse.com.au  Fact Sheet for Healthcare Providers:  IncredibleEmployment.be  This test is no t yet approved or cleared by the [place] FDA and  has been authorized for detection and/or diagnosis of SARS-CoV-2 by FDA under an Emergency Use Authorization (EUA). This EUA will remain  in effect (meaning this test can be used) for the duration of the COVID-19 declaration under Section 564(b)(1) of the Act, 21 U.S.C.section 360bbb-3(b)(1), unless the authorization is terminated  or revoked sooner.       Influenza A by PCR NEGATIVE NEGATIVE Final   Influenza B by PCR NEGATIVE NEGATIVE Final    Comment: (NOTE) The Xpert Xpress SARS-CoV-2/FLU/RSV plus assay is intended as an aid in the diagnosis of influenza from Nasopharyngeal swab specimens and should not be used as a sole basis for treatment. Nasal washings and aspirates are unacceptable for Xpert Xpress SARS-CoV-2/FLU/RSV testing.  Fact Sheet for Patients: EntrepreneurPulse.com.au  Fact Sheet for Healthcare Providers: IncredibleEmployment.be  This test is not yet approved or cleared by the [place] FDA and has been authorized for detection and/or diagnosis of SARS-CoV-2 by FDA under an Emergency Use Authorization (EUA). This EUA will  remain in effect (meaning this test can be used) for the duration of the COVID-19 declaration under Section 564(b)(1) of the Act, 21 U.S.C. section 360bbb-3(b)(1), unless the authorization is terminated or revoked.  Performed at [facility], [place] [address], [place], [place] [address]   Urine Culture     Status: Abnormal   Collection Time: 08/31/21 11:33 PM   Specimen: Urine, Clean Catch  Result Value Ref Range Status   Specimen Description   Final    URINE, CLEAN CATCH Performed at [facility], [place] [address] [place], [place] [address]    Special Requests   Final    NONE Performed at [facility], [place] [address] [place], [place] [address]    Culture MULTIPLE SPECIES PRESENT, SUGGEST RECOLLECTION (  A)  Final   Report Status 09/01/2021 FINAL  Final  SARS CORONAVIRUS 2 (TAT 6-24 HRS) Nasopharyngeal Nasopharyngeal Swab     Status: None   Collection Time: 09/07/21  4:40 AM   Specimen: Nasopharyngeal Swab  Result Value Ref Range Status   SARS Coronavirus 2 NEGATIVE NEGATIVE Final    Comment: (NOTE) SARS-CoV-2 target nucleic acids are NOT DETECTED.  The SARS-CoV-2 RNA is generally detectable in upper and lower respiratory specimens during the acute phase of infection. Negative results do not preclude SARS-CoV-2 infection, do not rule out co-infections with other pathogens, and should not be used as the sole basis for treatment or other patient management decisions. Negative results must be combined with clinical observations, patient history, and epidemiological information. The expected result is Negative.  Fact Sheet for Patients: SugarRoll.be  Fact Sheet for Healthcare Providers: https://www.woods-mathews.com/  This test is not yet approved or cleared by the [place] FDA and  has been authorized for detection and/or diagnosis of SARS-CoV-2 by FDA under an Emergency Use  Authorization (EUA). This EUA will remain  in effect (meaning this test can be used) for the duration of the COVID-19 declaration under Se ction 564(b)(1) of the Act, 21 U.S.C. section 360bbb-3(b)(1), unless the authorization is terminated or revoked sooner.  Performed at [facility], [place] [address], [place], [place] [address]   Resp Panel by RT-PCR (Flu A&B, Covid) Nasopharyngeal Swab     Status: None   Collection Time: 09/07/21 11:15 AM   Specimen: Nasopharyngeal Swab; Nasopharyngeal(NP) swabs in vial transport medium  Result Value Ref Range Status   SARS Coronavirus 2 by RT PCR NEGATIVE NEGATIVE Final    Comment: (NOTE) SARS-CoV-2 target nucleic acids are NOT DETECTED.  The SARS-CoV-2 RNA is generally detectable in upper respiratory specimens during the acute phase of infection. The lowest concentration of SARS-CoV-2 viral copies this assay can detect is 138 copies/mL. A negative result does not preclude SARS-Cov-2 infection and should not be used as the sole basis for treatment or other patient management decisions. A negative result may occur with  improper specimen collection/handling, submission of specimen other than nasopharyngeal swab, presence of viral mutation(s) within the areas targeted by this assay, and inadequate number of viral copies(<138 copies/mL). A negative result must be combined with clinical observations, patient history, and epidemiological information. The expected result is Negative.  Fact Sheet for Patients:  EntrepreneurPulse.com.au  Fact Sheet for Healthcare Providers:  IncredibleEmployment.be  This test is no t yet approved or cleared by the [place] FDA and  has been authorized for detection and/or diagnosis of SARS-CoV-2 by FDA under an Emergency Use Authorization (EUA). This EUA will remain  in effect (meaning this test can be used) for the duration of the COVID-19 declaration under Section  564(b)(1) of the Act, 21 U.S.C.section 360bbb-3(b)(1), unless the authorization is terminated  or revoked sooner.       Influenza A by PCR NEGATIVE NEGATIVE Final   Influenza B by PCR NEGATIVE NEGATIVE Final    Comment: (NOTE) The Xpert Xpress SARS-CoV-2/FLU/RSV plus assay is intended as an aid in the diagnosis of influenza from Nasopharyngeal swab specimens and should not be used as a sole basis for treatment. Nasal washings and aspirates are unacceptable for Xpert Xpress SARS-CoV-2/FLU/RSV testing.  Fact Sheet for Patients: EntrepreneurPulse.com.au  Fact Sheet for Healthcare Providers: IncredibleEmployment.be  This test is not yet approved or cleared by the [place] FDA and has been authorized for detection and/or diagnosis of  SARS-CoV-2 by FDA under an Emergency Use Authorization (EUA). This EUA will remain in effect (meaning this test can be used) for the duration of the COVID-19 declaration under Section 564(b)(1) of the Act, 21 U.S.C. section 360bbb-3(b)(1), unless the authorization is terminated or revoked.  Performed at [facility], [place] [address] [place], [place] [address]    Time spent: 30 min  SIGNED:   Marylu Lund, MD  [facility] 09/07/2021, 1:27 PM  If 7PM-7AM, please contact night-coverage

## 2021-09-07 NOTE — Progress Notes (Deleted)
Physician Discharge Summary  Stephanie [name] VOJ:[id number] DOB: 12/17/1945 DOA: 08/31/2021  PCP: [name], MD  Admit date: 08/31/2021 Discharge date: 09/07/2021  Admitted From: Home Disposition:  SNF  Recommendations for Outpatient Follow-up:  Follow up with PCP in 1-2 weeks Psychiatry recommends increasing sertraline in one week Repeat bmet in 1-2 weeks  Discharge Condition:Improved CODE STATUS:Full Diet recommendation: Diabetic, heart healthy   Brief/Interim Summary: 75 year old female with history of systolic CHF (TTE September 2022 showed EF 25-30%), chronic hypoxia with 2 L oxygen at night, DM2, HTN, HLD, Parkinson's disease, left bundle branch block, comes into the hospital for generalized weakness.  She was recently hospitalized in September 2022 for acute decompensated heart failure, was diuresed and weight on discharge was 182 pounds.  Of note, during prior hospitalization there was concern about a possible esophageal foreign body on CT angiogram, she was evaluated by speech and considered high risk for aspiration not a good candidate for barium esophagogram.  She was able to tolerate p.o. without issues.  Complains of weakness currently, inability to walk, and increased swelling in her legs  Discharge Diagnoses:  Principal Problem:   Acute on chronic HFrEF (heart failure with reduced ejection fraction) (HCC) Active Problems:   Type 2 diabetes mellitus ([place])   Hypothyroid   Parkinson's disease ([place])   Hyperlipidemia associated with type 2 diabetes mellitus ([place])   Hypertension associated with diabetes ([place])   CAP (community acquired pneumonia)  Principal Problem Acute on chronic systolic CHF-came into the hospital with worsening dyspnea, increased edema and elevated BNP.  -Continue home medication with losartan, metoprolol -Most recent 2D echo done in September 2022 when she was admitted with fluid overload at [facility] showed an EF of 25-30%, severely  decreased LV function, global hypokinesis.   -Clinically improved with IV lasix, now on hold -Have started oral diuretic -Patient and family reports that patient is followed closely by cardiology as outpatient. Have messaged and updated patient's primary Cardiologist -Recommend repeat bmet in 1 week   Active Problems Lobar pneumonia, right upper lobe-chest x-ray concerning for right upper lobe pneumonia.  She definitely is at risk for aspiration.  She has completed ceftriaxone and azithromycin -Per speech pathologist, esophagogram was recommended.  Reviewed.  Patient noted to have a persistent narrowing at the gastroesophageal junction which could reflect stricture or mass. -Appreciate input by GI. Recommendations to f/u with primary GI when pt is ultimately discharged   Acute metabolic encephalopathy- -Seems to be much improved, conversing appropriately   Lower extremity cellulitis-there is a degree of venous stasis changes however the rash looks bright red, somewhat tender to palpation and cannot exclude a degree of cellulitis.  Completed course of abx   Generalized weakness/deconditioning-normally ambulates with a walker but unable to do so in the last few days.  PT/OT consulted, thus far recommendations for skilled nursing facility recommended, TOC following, covid screening ordered   History of dysphagia-speech consulted, appreciate recommendations -Per above, esophagram reviewed with GI recs noted   Essential hypertension-continue home medications as below  Hypothyroidism-continue Synthroid as tolerated   Parkinson's disease-continue Sinemet as tolerated -resting tremor stable   Chest pain-EKG with LBBB however this is known to be chronic.   -Serial troponins reviewed, serially negative   LBBB-noted   DM2 -continue sliding scale as needed -Cont home medications on d/c   Anxiety -Appreciate assistance by psychiatry.   -Recommendations for sertraline 25mg  and consider  increasing in 1 week -Psychiatry recs to continue quetiapine 25mg  QHS -Psychiatry recommendations to  repeat BMP as outpatient   Discharge Instructions   Allergies as of 09/07/2021   No Known Allergies      Medication List     STOP taking these medications    OXYGEN   pramipexole 0.125 MG tablet Commonly known as: MIRAPEX       TAKE these medications    allopurinol 100 MG tablet Commonly known as: ZYLOPRIM Take 100 mg by mouth daily.   carbidopa-levodopa 25-100 MG tablet Commonly known as: SINEMET IR Take 2.5 tablets by mouth 4 (four) times daily.   carbidopa-levodopa 50-200 MG tablet Commonly known as: SINEMET CR Take 1 tablet by mouth at bedtime.   docusate sodium 100 MG capsule Commonly known as: COLACE Take 100 mg by mouth 2 (two) times daily as needed for mild constipation.   fluticasone 50 MCG/ACT nasal spray Commonly known as: FLONASE Place 1-2 sprays into the nose daily as needed for allergies.   FreeStyle Libre 14 Day Sensor Misc by Does not apply route.   gabapentin 300 MG capsule Commonly known as: NEURONTIN Take 300 mg by mouth at bedtime.   glucose 4 GM chewable tablet Chew 1 tablet by mouth as needed for low blood sugar.   levothyroxine 75 MCG tablet Commonly known as: SYNTHROID Take 75 mcg by mouth daily. What changed: Another medication with the same name was removed. Continue taking this medication, and follow the directions you see here.   losartan 50 MG tablet Commonly known as: COZAAR Take 1 tablet (50 mg total) by mouth daily.   metFORMIN 1000 MG tablet Commonly known as: GLUCOPHAGE Take 1,000 mg by mouth 2 (two) times daily with a meal.   metoprolol succinate 50 MG 24 hr tablet Commonly known as: TOPROL-XL Take 50 mg by mouth daily. Take with or immediately following a meal.   multivitamin with minerals Tabs tablet Take 1 tablet by mouth daily.   oxybutynin 10 MG 24 hr tablet Commonly known as: DITROPAN-XL Take 10 mg  by mouth daily.   oxyCODONE-acetaminophen 5-325 MG tablet Commonly known as: PERCOCET/ROXICET Take 1 tablet by mouth every 6 (six) hours as needed for severe pain.   pioglitazone 30 MG tablet Commonly known as: ACTOS Take 30 mg by mouth daily.   pravastatin 40 MG tablet Commonly known as: PRAVACHOL Take 40 mg by mouth daily.   QUEtiapine 25 MG tablet Commonly known as: SEROQUEL Take 1 tablet (25 mg total) by mouth at bedtime. What changed: additional instructions   sertraline 25 MG tablet Commonly known as: ZOLOFT Take 1 tablet (25 mg total) by mouth daily. Start taking on: September 08, 2021   vitamin B-12 500 MCG tablet Commonly known as: CYANOCOBALAMIN Take 500 mcg by mouth daily.        Contact information for after-discharge care     Destination     [facility] .   Service: Assisted Living Contact information: [address] [place] [phone]                    No Known Allergies  Consultations: Psychiatry  Procedures/Studies: DG Chest 2 View  Result Date: 08/31/2021 CLINICAL DATA:  chest pain EXAM: CHEST - 2 VIEW COMPARISON:  Chest x-ray 07/24/2021, CT chest 07/22/2021 FINDINGS: The heart and mediastinal contours are unchanged. Lingular linear atelectasis versus scarring. Question developing patchy airspace opacity within the right upper lobe. Similar appearing coarsened interstitial markings with no overt pulmonary edema. No pleural effusion. No pneumothorax. No  acute osseous abnormality. IMPRESSION: Question developing patchy airspace opacity within the right upper lobe. Electronically Signed   By: Iven Finn M.D.   On: 08/31/2021 21:44   CT HEAD WO CONTRAST (5MM)  Result Date: 09/03/2021 CLINICAL DATA:  Cerebral hemorrhage suspected EXAM: CT HEAD WITHOUT CONTRAST TECHNIQUE: Contiguous axial images were obtained from the base of the skull through the vertex without  intravenous contrast. COMPARISON:  Head CT 4 days ago 08/31/2021 FINDINGS: Brain: Patient had difficulty tolerating the exam, there is motion artifact despite repeat acquisition. Allowing for this, no acute intracranial hemorrhage. No subdural collection. No hydrocephalus or midline shift. Mild periventricular chronic small vessel ischemia is grossly stable but better assessed on prior. Vascular: Limited assessment due to motion. Skull: No skull fracture. Sinuses/Orbits: No acute findings. Other: None. IMPRESSION: Motion limited exam. Allowing for this, no acute intracranial abnormality. Electronically Signed   By: Keith Rake M.D.   On: 09/03/2021 21:25   CT HEAD WO CONTRAST (5MM)  Result Date: 08/31/2021 CLINICAL DATA:  Headache EXAM: CT HEAD WITHOUT CONTRAST TECHNIQUE: Contiguous axial images were obtained from the base of the skull through the vertex without intravenous contrast. COMPARISON:  07/06/2021 FINDINGS: Brain: No evidence of acute infarction, hemorrhage, hydrocephalus, extra-axial collection or mass lesion/mass effect. Subcortical white matter and periventricular small vessel ischemic changes. Vascular: Intracranial atherosclerosis. Skull: Normal. Negative for fracture or focal lesion. Sinuses/Orbits: The visualized paranasal sinuses are essentially clear. The mastoid air cells are unopacified. Other: None. IMPRESSION: No evidence of acute intracranial abnormality. Small vessel ischemic changes. Electronically Signed   By: Julian Hy M.D.   On: 08/31/2021 22:02   EEG adult  Result Date: 09/04/2021 Ophelia Charter     09/04/2021  6:55 PM EEG Study Report Name: Joice Nazario Account Number: [id number] Attending: Ophelia Charter, M.D. Date: 09/04/2021 Duration: 22 min Patient history: 75 year old female with PD and CHF with generalized weakness. Level of alertness: awake AEDs during EEG study: alprazolam, gabapentin Technical aspects: This video-EEG study was done with 19 scalp  electrodes positioned according to the 10-20 International system of electrode placement. Electrical activity was acquired at a sampling rate of 500Hz  and reviewed with a high frequency filter of 70Hz  and a low frequency filter of 1Hz . Video-EEG data were recorded continuously and digitally stored.   Software used: Natus with Scientist, physiological. BACKGROUND ACTIVITY: Posterior dominant rhythm: The posterior dominant rhythm consisted of a 6-7 Hz moderate voltage rhythm distributed symmetrically in the posterior head regions that had limited variability.  Beta: There is 15 to 18 Hz, 2-3 uV beta activity with irregular morphology distributed symmetrically and diffusely. Slowing: none Attenuation: none EPILEPTIFORM ACTIVITY: Interictal epileptiform activity: none Ictal Activity: none OTHER EVENTS: Prominent motion artifact was present throughout the majority of the recording. SLEEP RECORDINGS: Sleep architecture was not recorded. ACTIVATION PROCEDURES: Hyperventilation and photic stimulation were not performed. IMPRESSION: This study is abnormal due to mild slowing of the posterior background consistent with mild encephalopathy. No seizures or epileptiform discharges were seen throughout the recording.  DG ESOPHAGUS W SINGLE CM (SOL OR THIN BA)  Result Date: 09/04/2021 CLINICAL DATA:  Pneumonia EXAM: ESOPHOGRAM/BARIUM SWALLOW TECHNIQUE: Single contrast examination was performed using barium. FLUOROSCOPY TIME:  Fluoroscopy Time:  1 minute 6 seconds Radiation Exposure Index (if provided by the fluoroscopic device): 8.4 mGy COMPARISON:  None. FINDINGS: There is no aspiration. There is persistent narrowing at the gastroesophageal junction. Contrast does pass through this region but with delay. As result, there are multiple tertiary contractions  proximally. Remainder of the esophagus demonstrates a normal caliber. IMPRESSION: Persistent narrowing at the gastroesophageal junction, which could reflect  stricture or mass. Electronically Signed   By: Macy Mis M.D.   On: 09/04/2021 09:13    Subjective: Eager to start therapy  Discharge Exam: Vitals:   09/07/21 0700 09/07/21 1125  BP: 135/82 95/63  Pulse: 64 72  Resp: 20 14  Temp: 98 F (36.7 C) (!) 97.5 F (36.4 C)  SpO2: 99% 96%   Vitals:   09/06/21 2253 09/07/21 0600 09/07/21 0700 09/07/21 1125  BP: (!) 113/51  135/82 95/63  Pulse: 66  64 72  Resp: 20  20 14   Temp: 98.1 F (36.7 C)  98 F (36.7 C) (!) 97.5 F (36.4 C)  TempSrc: Oral     SpO2: 96%  99% 96%  Weight:  73.7 kg    Height:        General: Pt is alert, awake, not in acute distress Cardiovascular: RRR, S1/S2  Respiratory: CTA bilaterally, no wheezing, no rhonchi Abdominal: Soft, NT, ND, bowel sounds + Extremities: no edema, no cyanosis   The results of significant diagnostics from this hospitalization (including imaging, microbiology, ancillary and laboratory) are listed below for reference.     Microbiology: Recent Results (from the past 240 hour(s))  Resp Panel by RT-PCR (Flu A&B, Covid) Nasopharyngeal Swab     Status: None   Collection Time: 08/31/21 10:25 PM   Specimen: Nasopharyngeal Swab; Nasopharyngeal(NP) swabs in vial transport medium  Result Value Ref Range Status   SARS Coronavirus 2 by RT PCR NEGATIVE NEGATIVE Final    Comment: (NOTE) SARS-CoV-2 target nucleic acids are NOT DETECTED.  The SARS-CoV-2 RNA is generally detectable in upper respiratory specimens during the acute phase of infection. The lowest concentration of SARS-CoV-2 viral copies this assay can detect is 138 copies/mL. A negative result does not preclude SARS-Cov-2 infection and should not be used as the sole basis for treatment or other patient management decisions. A negative result may occur with  improper specimen collection/handling, submission of specimen other than nasopharyngeal swab, presence of viral mutation(s) within the areas targeted by this assay, and  inadequate number of viral copies(<138 copies/mL). A negative result must be combined with clinical observations, patient history, and epidemiological information. The expected result is Negative.  Fact Sheet for Patients:  EntrepreneurPulse.com.au  Fact Sheet for Healthcare Providers:  IncredibleEmployment.be  This test is no t yet approved or cleared by the [place] FDA and  has been authorized for detection and/or diagnosis of SARS-CoV-2 by FDA under an Emergency Use Authorization (EUA). This EUA will remain  in effect (meaning this test can be used) for the duration of the COVID-19 declaration under Section 564(b)(1) of the Act, 21 U.S.C.section 360bbb-3(b)(1), unless the authorization is terminated  or revoked sooner.       Influenza A by PCR NEGATIVE NEGATIVE Final   Influenza B by PCR NEGATIVE NEGATIVE Final    Comment: (NOTE) The Xpert Xpress SARS-CoV-2/FLU/RSV plus assay is intended as an aid in the diagnosis of influenza from Nasopharyngeal swab specimens and should not be used as a sole basis for treatment. Nasal washings and aspirates are unacceptable for Xpert Xpress SARS-CoV-2/FLU/RSV testing.  Fact Sheet for Patients: EntrepreneurPulse.com.au  Fact Sheet for Healthcare Providers: IncredibleEmployment.be  This test is not yet approved or cleared by the [place] FDA and has been authorized for detection and/or diagnosis of SARS-CoV-2 by FDA under an Emergency Use Authorization (EUA). This EUA will  remain in effect (meaning this test can be used) for the duration of the COVID-19 declaration under Section 564(b)(1) of the Act, 21 U.S.C. section 360bbb-3(b)(1), unless the authorization is terminated or revoked.  Performed at [facility], [place] [address] [place], [place] [address]   Urine Culture     Status: Abnormal   Collection Time: 08/31/21 11:33 PM    Specimen: Urine, Clean Catch  Result Value Ref Range Status   Specimen Description   Final    URINE, CLEAN CATCH Performed at [facility], [place] [address], [place], [place] [address]    Special Requests   Final    NONE Performed at [facility], [place] [address], [place], [place] [address]    Culture MULTIPLE SPECIES PRESENT, SUGGEST RECOLLECTION (A)  Final   Report Status 09/01/2021 FINAL  Final  SARS CORONAVIRUS 2 (TAT 6-24 HRS) Nasopharyngeal Nasopharyngeal Swab     Status: None   Collection Time: 09/07/21  4:40 AM   Specimen: Nasopharyngeal Swab  Result Value Ref Range Status   SARS Coronavirus 2 NEGATIVE NEGATIVE Final    Comment: (NOTE) SARS-CoV-2 target nucleic acids are NOT DETECTED.  The SARS-CoV-2 RNA is generally detectable in upper and lower respiratory specimens during the acute phase of infection. Negative results do not preclude SARS-CoV-2 infection, do not rule out co-infections with other pathogens, and should not be used as the sole basis for treatment or other patient management decisions. Negative results must be combined with clinical observations, patient history, and epidemiological information. The expected result is Negative.  Fact Sheet for Patients: SugarRoll.be  Fact Sheet for Healthcare Providers: https://www.woods-mathews.com/  This test is not yet approved or cleared by the [place] FDA and  has been authorized for detection and/or diagnosis of SARS-CoV-2 by FDA under an Emergency Use Authorization (EUA). This EUA will remain  in effect (meaning this test can be used) for the duration of the COVID-19 declaration under Se ction 564(b)(1) of the Act, 21 U.S.C. section 360bbb-3(b)(1), unless the authorization is terminated or revoked sooner.  Performed at [facility], [place] [address], [place], [place] [address]   Resp Panel by RT-PCR (Flu A&B, Covid)  Nasopharyngeal Swab     Status: None   Collection Time: 09/07/21 11:15 AM   Specimen: Nasopharyngeal Swab; Nasopharyngeal(NP) swabs in vial transport medium  Result Value Ref Range Status   SARS Coronavirus 2 by RT PCR NEGATIVE NEGATIVE Final    Comment: (NOTE) SARS-CoV-2 target nucleic acids are NOT DETECTED.  The SARS-CoV-2 RNA is generally detectable in upper respiratory specimens during the acute phase of infection. The lowest concentration of SARS-CoV-2 viral copies this assay can detect is 138 copies/mL. A negative result does not preclude SARS-Cov-2 infection and should not be used as the sole basis for treatment or other patient management decisions. A negative result may occur with  improper specimen collection/handling, submission of specimen other than nasopharyngeal swab, presence of viral mutation(s) within the areas targeted by this assay, and inadequate number of viral copies(<138 copies/mL). A negative result must be combined with clinical observations, patient history, and epidemiological information. The expected result is Negative.  Fact Sheet for Patients:  EntrepreneurPulse.com.au  Fact Sheet for Healthcare Providers:  IncredibleEmployment.be  This test is no t yet approved or cleared by the [place] FDA and  has been authorized for detection and/or diagnosis of SARS-CoV-2 by FDA under an Emergency Use Authorization (EUA). This EUA will remain  in effect (  meaning this test can be used) for the duration of the COVID-19 declaration under Section 564(b)(1) of the Act, 21 U.S.C.section 360bbb-3(b)(1), unless the authorization is terminated  or revoked sooner.       Influenza A by PCR NEGATIVE NEGATIVE Final   Influenza B by PCR NEGATIVE NEGATIVE Final    Comment: (NOTE) The Xpert Xpress SARS-CoV-2/FLU/RSV plus assay is intended as an aid in the diagnosis of influenza from Nasopharyngeal swab specimens and should not be  used as a sole basis for treatment. Nasal washings and aspirates are unacceptable for Xpert Xpress SARS-CoV-2/FLU/RSV testing.  Fact Sheet for Patients: EntrepreneurPulse.com.au  Fact Sheet for Healthcare Providers: IncredibleEmployment.be  This test is not yet approved or cleared by the [place] FDA and has been authorized for detection and/or diagnosis of SARS-CoV-2 by FDA under an Emergency Use Authorization (EUA). This EUA will remain in effect (meaning this test can be used) for the duration of the COVID-19 declaration under Section 564(b)(1) of the Act, 21 U.S.C. section 360bbb-3(b)(1), unless the authorization is terminated or revoked.  Performed at [facility], [place] [address] [place], [place] [address]      Labs: BNP (last 3 results) Recent Labs    07/06/21 2223 07/20/21 1824 08/31/21 2224  BNP 680.6* 980.6* 956.3*   Basic Metabolic Panel: Recent Labs  Lab 09/01/21 0426 09/02/21 0430 09/03/21 0424 09/04/21 0418 09/05/21 0512 09/06/21 0522 09/07/21 0421  NA 139 137 138 137 134* 134* 135  K 3.7 3.1* 3.8 4.1 4.1 4.0 4.5  CL 106 100 102 100 97* 98 97*  CO2 25 26 27 28 29 28 29   GLUCOSE 106* 101* 130* 84 102* 105* 132*  BUN 27* 22 20 26* 33* 28* 35*  CREATININE 0.83 0.62 0.66 0.92 1.14* 0.61 1.10*  CALCIUM 8.7* 8.3* 8.3* 8.3* 8.4* 8.3* 8.8*  MG 1.5* 1.5*  --  1.6* 2.4  --   --    Liver Function Tests: Recent Labs  Lab 09/03/21 0424 09/04/21 0418 09/05/21 0512 09/06/21 0522 09/07/21 0421  AST 11* 12* 9* 7* 13*  ALT <5 <5 <5 <5 5  ALKPHOS 85 81 74 71 71  BILITOT 0.6 0.9 0.7 0.6 0.5  PROT 6.7 6.6 6.5 6.5 7.1  ALBUMIN 3.1* 3.2* 2.9* 2.8* 2.9*   No results for input(s): LIPASE, AMYLASE in the last 168 hours. No results for input(s): AMMONIA in the last 168 hours. CBC: Recent Labs  Lab 08/31/21 2223 09/01/21 0426 09/02/21 0430 09/03/21 0424  WBC 6.3 5.7 5.6 6.1  NEUTROABS 4.6  --    --   --   HGB 11.9* 11.1* 11.2* 11.0*  HCT 38.2 36.5 36.3 35.1*  MCV 84.9 86.1 84.4 83.6  PLT 175 162 150 160   Cardiac Enzymes: Recent Labs  Lab 09/01/21 0426  CKTOTAL 53   BNP: Invalid input(s): POCBNP CBG: Recent Labs  Lab 09/06/21 0732 09/06/21 1147 09/06/21 1640 09/07/21 0739 09/07/21 1122  GLUCAP 99 189* 126* 105* 284*   D-Dimer No results for input(s): DDIMER in the last 72 hours. Hgb A1c No results for input(s): HGBA1C in the last 72 hours. Lipid Profile No results for input(s): CHOL, HDL, LDLCALC, TRIG, CHOLHDL, LDLDIRECT in the last 72 hours. Thyroid function studies No results for input(s): TSH, T4TOTAL, T3FREE, THYROIDAB in the last 72 hours.  Invalid input(s): FREET3 Anemia work up No results for input(s): VITAMINB12, FOLATE, FERRITIN, TIBC, IRON, RETICCTPCT in the last 72 hours. Urinalysis    Component Value Date/Time  COLORURINE YELLOW 08/31/2021 2333   APPEARANCEUR HAZY (A) 08/31/2021 2333   LABSPEC 1.024 08/31/2021 2333   PHURINE 5.0 08/31/2021 2333   GLUCOSEU NEGATIVE 08/31/2021 2333   HGBUR NEGATIVE 08/31/2021 2333   BILIRUBINUR NEGATIVE 08/31/2021 2333   KETONESUR 5 (A) 08/31/2021 2333   PROTEINUR 100 (A) 08/31/2021 2333   NITRITE NEGATIVE 08/31/2021 2333   LEUKOCYTESUR NEGATIVE 08/31/2021 2333   Sepsis Labs Invalid input(s): PROCALCITONIN,  WBC,  LACTICIDVEN Microbiology Recent Results (from the past 240 hour(s))  Resp Panel by RT-PCR (Flu A&B, Covid) Nasopharyngeal Swab     Status: None   Collection Time: 08/31/21 10:25 PM   Specimen: Nasopharyngeal Swab; Nasopharyngeal(NP) swabs in vial transport medium  Result Value Ref Range Status   SARS Coronavirus 2 by RT PCR NEGATIVE NEGATIVE Final    Comment: (NOTE) SARS-CoV-2 target nucleic acids are NOT DETECTED.  The SARS-CoV-2 RNA is generally detectable in upper respiratory specimens during the acute phase of infection. The lowest concentration of SARS-CoV-2 viral copies this assay  can detect is 138 copies/mL. A negative result does not preclude SARS-Cov-2 infection and should not be used as the sole basis for treatment or other patient management decisions. A negative result may occur with  improper specimen collection/handling, submission of specimen other than nasopharyngeal swab, presence of viral mutation(s) within the areas targeted by this assay, and inadequate number of viral copies(<138 copies/mL). A negative result must be combined with clinical observations, patient history, and epidemiological information. The expected result is Negative.  Fact Sheet for Patients:  EntrepreneurPulse.com.au  Fact Sheet for Healthcare Providers:  IncredibleEmployment.be  This test is no t yet approved or cleared by the [place] FDA and  has been authorized for detection and/or diagnosis of SARS-CoV-2 by FDA under an Emergency Use Authorization (EUA). This EUA will remain  in effect (meaning this test can be used) for the duration of the COVID-19 declaration under Section 564(b)(1) of the Act, 21 U.S.C.section 360bbb-3(b)(1), unless the authorization is terminated  or revoked sooner.       Influenza A by PCR NEGATIVE NEGATIVE Final   Influenza B by PCR NEGATIVE NEGATIVE Final    Comment: (NOTE) The Xpert Xpress SARS-CoV-2/FLU/RSV plus assay is intended as an aid in the diagnosis of influenza from Nasopharyngeal swab specimens and should not be used as a sole basis for treatment. Nasal washings and aspirates are unacceptable for Xpert Xpress SARS-CoV-2/FLU/RSV testing.  Fact Sheet for Patients: EntrepreneurPulse.com.au  Fact Sheet for Healthcare Providers: IncredibleEmployment.be  This test is not yet approved or cleared by the [place] FDA and has been authorized for detection and/or diagnosis of SARS-CoV-2 by FDA under an Emergency Use Authorization (EUA). This EUA will  remain in effect (meaning this test can be used) for the duration of the COVID-19 declaration under Section 564(b)(1) of the Act, 21 U.S.C. section 360bbb-3(b)(1), unless the authorization is terminated or revoked.  Performed at [facility], [place] [address] [place], [place] [address]   Urine Culture     Status: Abnormal   Collection Time: 08/31/21 11:33 PM   Specimen: Urine, Clean Catch  Result Value Ref Range Status   Specimen Description   Final    URINE, CLEAN CATCH Performed at [facility], [place] [address], [place], [place] [address]    Special Requests   Final    NONE Performed at [facility], [place] [address], [place], [place] [address]    Culture MULTIPLE SPECIES PRESENT, SUGGEST RECOLLECTION (  A)  Final   Report Status 09/01/2021 FINAL  Final  SARS CORONAVIRUS 2 (TAT 6-24 HRS) Nasopharyngeal Nasopharyngeal Swab     Status: None   Collection Time: 09/07/21  4:40 AM   Specimen: Nasopharyngeal Swab  Result Value Ref Range Status   SARS Coronavirus 2 NEGATIVE NEGATIVE Final    Comment: (NOTE) SARS-CoV-2 target nucleic acids are NOT DETECTED.  The SARS-CoV-2 RNA is generally detectable in upper and lower respiratory specimens during the acute phase of infection. Negative results do not preclude SARS-CoV-2 infection, do not rule out co-infections with other pathogens, and should not be used as the sole basis for treatment or other patient management decisions. Negative results must be combined with clinical observations, patient history, and epidemiological information. The expected result is Negative.  Fact Sheet for Patients: SugarRoll.be  Fact Sheet for Healthcare Providers: https://www.woods-mathews.com/  This test is not yet approved or cleared by the [place] FDA and  has been authorized for detection and/or diagnosis of SARS-CoV-2 by FDA under an Emergency Use  Authorization (EUA). This EUA will remain  in effect (meaning this test can be used) for the duration of the COVID-19 declaration under Se ction 564(b)(1) of the Act, 21 U.S.C. section 360bbb-3(b)(1), unless the authorization is terminated or revoked sooner.  Performed at [facility], [place] [address] [place], [place] [address]   Resp Panel by RT-PCR (Flu A&B, Covid) Nasopharyngeal Swab     Status: None   Collection Time: 09/07/21 11:15 AM   Specimen: Nasopharyngeal Swab; Nasopharyngeal(NP) swabs in vial transport medium  Result Value Ref Range Status   SARS Coronavirus 2 by RT PCR NEGATIVE NEGATIVE Final    Comment: (NOTE) SARS-CoV-2 target nucleic acids are NOT DETECTED.  The SARS-CoV-2 RNA is generally detectable in upper respiratory specimens during the acute phase of infection. The lowest concentration of SARS-CoV-2 viral copies this assay can detect is 138 copies/mL. A negative result does not preclude SARS-Cov-2 infection and should not be used as the sole basis for treatment or other patient management decisions. A negative result may occur with  improper specimen collection/handling, submission of specimen other than nasopharyngeal swab, presence of viral mutation(s) within the areas targeted by this assay, and inadequate number of viral copies(<138 copies/mL). A negative result must be combined with clinical observations, patient history, and epidemiological information. The expected result is Negative.  Fact Sheet for Patients:  EntrepreneurPulse.com.au  Fact Sheet for Healthcare Providers:  IncredibleEmployment.be  This test is no t yet approved or cleared by the [place] FDA and  has been authorized for detection and/or diagnosis of SARS-CoV-2 by FDA under an Emergency Use Authorization (EUA). This EUA will remain  in effect (meaning this test can be used) for the duration of the COVID-19 declaration under Section  564(b)(1) of the Act, 21 U.S.C.section 360bbb-3(b)(1), unless the authorization is terminated  or revoked sooner.       Influenza A by PCR NEGATIVE NEGATIVE Final   Influenza B by PCR NEGATIVE NEGATIVE Final    Comment: (NOTE) The Xpert Xpress SARS-CoV-2/FLU/RSV plus assay is intended as an aid in the diagnosis of influenza from Nasopharyngeal swab specimens and should not be used as a sole basis for treatment. Nasal washings and aspirates are unacceptable for Xpert Xpress SARS-CoV-2/FLU/RSV testing.  Fact Sheet for Patients: EntrepreneurPulse.com.au  Fact Sheet for Healthcare Providers: IncredibleEmployment.be  This test is not yet approved or cleared by the [place] FDA and has been authorized for detection and/or diagnosis of  SARS-CoV-2 by FDA under an Emergency Use Authorization (EUA). This EUA will remain in effect (meaning this test can be used) for the duration of the COVID-19 declaration under Section 564(b)(1) of the Act, 21 U.S.C. section 360bbb-3(b)(1), unless the authorization is terminated or revoked.  Performed at [facility], [place] [address], [place], [place] [address]    Time spent: 30 min  SIGNED:   Marylu Lund, MD  [facility] 09/07/2021, 1:27 PM  If 7PM-7AM, please contact night-coverage

## 2021-09-07 NOTE — Consult Note (Signed)
Brief Psychiatry Consult Note  The patient was last seen by the psychiatry service on 09/04/21. Interim documentation by primary team and nursing staff has been reviewed. At this time, patient is going to be discharged to SNF pending a negative COVID test and there is no evidence of acute psychiatric disturbance requiring ongoing psychiatric consultation. Please see last consult note for full assessment. Final medication recommendations are as follows:  - c sertraline 25 mg, consider increase in ~1 week - c quetiapine 25 mg QHS - would NOT Prescribe alprazolam at d/c; this is not a home med and pt has not requested in 2-3 days (risk>benefit) - BMP at next PCP appt/outpt lab check    We will sign off at this time. This has been communicated to the primary team. If issues arise in the future, don't hesitate to contact the Psychiatry Inpatient Consult Service.  Stephanie [name]

## 2021-11-12 ENCOUNTER — Ambulatory Visit: Payer: Medicare Other | Admitting: Family

## 2021-11-27 NOTE — Progress Notes (Signed)
Patient ID: SUJATA MAINES, female    DOB: [birth year], 76 y.o.   MRN: [id number]  HPI  Ms Mesta is a 76 y/o female with a history of Dm, hyperlipidemia, HTN, thyroid disease, anemia, GERD, Parkinson's disease, sleep apnea and chronic heart failure.   Echo report from 07/21/21 reviewed and showed an EF of 25-30% along with severe MR/TR and mild AR.   Admitted 08/31/21 due to weakness, inability to walk and leg swelling due to acute on chronic heart failure. Initially given IV lasix with transition to oral diuretics. Given antibiotics due to concern about pneumonia. GI & psychiatry consults obtained.   Discharged after 7 days. Admitted 07/20/21 due to acute on chronic heart failure. Negative for PE. Initially given IV lasix with transition to oral medications. Cardiology consult obtained. Possible foreign body noted in esophagus from CT. SLP evaluation done. Discharged after 8 days. Was in the ED 07/06/21 after a mechanical fall where she was treated and released.   She presents today for a follow-up visit with chief complaint of minimal shortness of breath with moderate exertion. She describes this as chronic in nature having been present for several years. She has associated fatigue, intermittent blurry vision, pedal edema and light-headedness along with this. She denies any difficulty sleeping, abdominal distention, palpitations, chest pain, cough or weight gain.   She says that she was started on jardiance and given a 14 day coupon and didn't have any difficulty in taking it. She said she was told it would be >$100/ month and she would be unable to afford it.   Recently started on a new medication, entacapone (comitan) for her parkinson's. Says that she's only been on it a few days but thinks it may be helping already.   Past Medical History:  Diagnosis Date   Anemia    Arthritis    CHF (congestive heart failure) (HCC)    Chronic back pain    Diabetes mellitus without complication  (HCC)    Dysrhythmia    GERD (gastroesophageal reflux disease)    Hyperlipidemia    Hypertension    Hypothyroid    Multilevel degenerative disc disease    Parkinson's disease ([place])    Sleep apnea    Syncope    Tremors of nervous system    Wears dentures    full upper and lower   Past Surgical History:  Procedure Laterality Date   ABDOMINAL HYSTERECTOMY     BACK SURGERY  09/16   CARDIAC CATHETERIZATION     CATARACT EXTRACTION W/PHACO Right 11/24/2015   Procedure: CATARACT EXTRACTION PHACO AND INTRAOCULAR LENS PLACEMENT ([place]);  Surgeon: Estill Cotta, MD;  Location: [facility];  Service: Ophthalmology;  Laterality: Right;  [place] 01:14 AP% 25.5 CDE 30.69 fluid pack lot # [id number]   CATARACT EXTRACTION W/PHACO Left 01/06/2021   Procedure: CATARACT EXTRACTION PHACO AND INTRAOCULAR LENS PLACEMENT ([facility]) LEFT DIABETIC 6.98 00:40.1;  Surgeon: Birder Robson, MD;  Location: [facility];  Service: Ophthalmology;  Laterality: Left;  Diabetic - oral meds   COLONOSCOPY     COLONOSCOPY WITH PROPOFOL N/A 07/24/2019   Procedure: COLONOSCOPY WITH PROPOFOL;  Surgeon: Toledo, Benay Pike, MD;  Location: [facility];  Service: Gastroenterology;  Laterality: N/A;   ESOPHAGOGASTRODUODENOSCOPY N/A 07/24/2019   Procedure: ESOPHAGOGASTRODUODENOSCOPY ([facility]);  Surgeon: Toledo, Benay Pike, MD;  Location: [facility];  Service: Gastroenterology;  Laterality: N/A;   OOPHORECTOMY     Family History  Problem Relation Age of Onset   Breast cancer Paternal 69  Social History   Tobacco Use   Smoking status: Never   Smokeless tobacco: Never  Substance Use Topics   Alcohol use: Not Currently    Comment: rare   No Known Allergies  Prior to Admission medications   Medication Sig Start Date End Date Taking? Authorizing Provider  allopurinol (ZYLOPRIM) 100 MG tablet Take 100 mg by mouth daily.   Yes [provider]  carbidopa-levodopa (SINEMET CR) 50-200 MG tablet Take 1 tablet by mouth  at bedtime.   Yes [provider]  carbidopa-levodopa (SINEMET IR) 25-100 MG tablet Take 2.5 tablets by mouth 4 (four) times daily.   Yes [provider]  Continuous Blood Gluc Sensor (FREESTYLE LIBRE 14 DAY SENSOR) MISC by Does not apply route.   Yes [provider]  entacapone (COMTAN) 200 MG tablet Take 200 mg by mouth 3 (three) times daily.   Yes [provider]  gabapentin (NEURONTIN) 300 MG capsule Take 300 mg by mouth at bedtime.   Yes [provider]  levothyroxine (SYNTHROID) 75 MCG tablet Take 75 mcg by mouth daily. 07/20/21 07/20/22 Yes [provider]  losartan (COZAAR) 50 MG tablet Take 1 tablet (50 mg total) by mouth daily. 07/29/21  Yes Sreenath, Sudheer B, MD  metFORMIN (GLUCOPHAGE) 1000 MG tablet Take 1,000 mg by mouth 2 (two) times daily with a meal.   Yes [provider]  metoprolol succinate (TOPROL-XL) 50 MG 24 hr tablet Take 50 mg by mouth daily. Take with or immediately following a meal.   Yes [provider]  Multiple Vitamin (MULTIVITAMIN WITH MINERALS) TABS tablet Take 1 tablet by mouth daily.   Yes [provider]  oxyCODONE-acetaminophen (PERCOCET/ROXICET) 5-325 MG tablet Take 1 tablet by mouth every 6 (six) hours as needed for severe pain. 09/07/21  Yes Donne Hazel, MD  pioglitazone (ACTOS) 30 MG tablet Take 30 mg by mouth daily. 11/10/20  Yes [provider]  pramipexole (MIRAPEX) 0.125 MG tablet Take 0.125 mg by mouth 4 (four) times daily.   Yes [provider]  pravastatin (PRAVACHOL) 40 MG tablet Take 40 mg by mouth daily.   Yes [provider]  QUEtiapine (SEROQUEL) 25 MG tablet Take 1 tablet (25 mg total) by mouth at bedtime. 09/07/21  Yes Donne Hazel, MD  sertraline (ZOLOFT) 25 MG tablet Take 1 tablet (25 mg total) by mouth daily. 09/08/21  Yes Donne Hazel, MD  fluticasone [facility]) 50 MCG/ACT nasal spray Place 1-2 sprays into the nose daily as needed  for allergies. 09/29/15   [provider]  furosemide (LASIX) 40 MG tablet Take 1 tablet (40 mg total) by mouth daily. Patient not taking: Reported on 11/30/2021 09/07/21 10/07/21  Donne Hazel, MD  glucose 4 GM chewable tablet Chew 1 tablet by mouth as needed for low blood sugar.    [provider]  glimepiride (AMARYL) 4 MG tablet Take 4 mg by mouth 2 (two) times daily. Pt. Takes 1 tablet in the morning and .5 tablet at night.  12/05/20  [provider]    Review of Systems  Constitutional:  Positive for fatigue. Negative for appetite change.  HENT:  Negative for congestion, postnasal drip and sore throat.   Eyes:  Positive for visual disturbance (blurry at times).  Respiratory:  Positive for shortness of breath. Negative for cough and chest tightness.   Cardiovascular:  Positive for leg swelling. Negative for chest pain and palpitations.  Gastrointestinal:  Negative for abdominal distention and abdominal  pain.  Endocrine: Negative.   Genitourinary: Negative.   Musculoskeletal:  Negative for back pain and neck pain.  Skin: Negative.   Allergic/Immunologic: Negative.   Neurological:  Positive for light-headedness. Negative for dizziness.  Hematological:  Negative for adenopathy. Does not bruise/bleed easily.  Psychiatric/Behavioral:  Negative for dysphoric mood and sleep disturbance (sleeping on 2-3 pillows). The patient is not nervous/anxious.    Vitals:   11/30/21 1405  BP: 140/70  Pulse: 91  Resp: 18  SpO2: 91%  Weight: 155 lb (70.3 kg)  Height: 5' (1.524 m)   Wt Readings from Last 3 Encounters:  11/30/21 155 lb (70.3 kg)  09/07/21 162 lb 7.7 oz (73.7 kg)  08/06/21 158 lb 8 oz (71.9 kg)   Lab Results  Component Value Date   CREATININE 1.10 (H) 09/07/2021   CREATININE 0.61 09/06/2021   CREATININE 1.14 (H) 09/05/2021   Physical Exam Vitals and nursing note reviewed.  Constitutional:      Appearance: Normal appearance.  HENT:     Head:  Normocephalic and atraumatic.  Cardiovascular:     Rate and Rhythm: Normal rate and regular rhythm.  Pulmonary:     Effort: Pulmonary effort is normal. No respiratory distress.     Breath sounds: No wheezing or rales.  Abdominal:     General: There is no distension.     Palpations: Abdomen is soft.  Musculoskeletal:        General: No tenderness.     Cervical back: Normal range of motion and neck supple.     Right lower leg: Edema (trace pitting) present.     Left lower leg: Edema (1+ pitting) present.  Skin:    General: Skin is warm and dry.     Findings: No bruising.  Neurological:     General: No focal deficit present.     Mental Status: She is alert and oriented to person, place, and time.  Psychiatric:        Mood and Affect: Mood normal.        Behavior: Behavior normal.        Thought Content: Thought content normal.    Assessment & Plan:  1: Chronic heart failure with reduced ejection fraction- - NYHA class II - euvolemic today - weighing daily; advised to call for an overnight weight gain of > 2 pounds or a weekly weight gain of > 5 pounds - weight down 3 pounds from last visit here 3 months ago - not adding salt and her daughter is doing most of the cooking and hasn't been using salt either - advised to keep daily fluid intake to between 60-64 ounces/ day - saw cardiology Petra Kuba) 10/08/21 - on GDMT of metoprolol succinate and losartan - says that she was put on jardiance with a 14 day coupon but she was unable to afford it; had patient fill out patient assistance for jardiance and if approved, she will resume it as 10mg  once daily - could also change her losartan to entresto but since she just started a new parkinson's medication a few days ago, will hold off on this; consider spironolactone in the future as well - BNP 08/31/21 was 991.1  2: HTN- - BP mildly elevated (140/70) although it's difficult to get a good reading due to her dyskinesia - saw PCP 10/06/21 -  CMP 10/06/21 reviewed and showed sodium 135, potassium 4.5, creatinine 1.10 and GFR 52  3: DM- - A1c 07/17/21 was 8.3% - glucose at home today was 138  4: Parkinson's- - saw neurology Manuella Ghazi) 11/20/21 - just started entacapone a few days ago   Medication bottles reviewed.   Return in 6 weeks, sooner if needed.

## 2021-11-30 ENCOUNTER — Other Ambulatory Visit: Payer: Self-pay

## 2021-11-30 ENCOUNTER — Encounter: Payer: Self-pay | Admitting: Family

## 2021-11-30 ENCOUNTER — Ambulatory Visit: Payer: Medicare Other | Attending: Family | Admitting: Family

## 2021-11-30 VITALS — BP 140/70 | HR 91 | Resp 18 | Ht 60.0 in | Wt 155.0 lb

## 2021-11-30 DIAGNOSIS — I110 Hypertensive heart disease with heart failure: Secondary | ICD-10-CM | POA: Insufficient documentation

## 2021-11-30 DIAGNOSIS — E119 Type 2 diabetes mellitus without complications: Secondary | ICD-10-CM | POA: Insufficient documentation

## 2021-11-30 DIAGNOSIS — H538 Other visual disturbances: Secondary | ICD-10-CM | POA: Diagnosis not present

## 2021-11-30 DIAGNOSIS — Z7901 Long term (current) use of anticoagulants: Secondary | ICD-10-CM | POA: Diagnosis not present

## 2021-11-30 DIAGNOSIS — Z7984 Long term (current) use of oral hypoglycemic drugs: Secondary | ICD-10-CM | POA: Diagnosis not present

## 2021-11-30 DIAGNOSIS — G20 Parkinson's disease: Secondary | ICD-10-CM | POA: Insufficient documentation

## 2021-11-30 DIAGNOSIS — R0602 Shortness of breath: Secondary | ICD-10-CM | POA: Diagnosis present

## 2021-11-30 DIAGNOSIS — Z79899 Other long term (current) drug therapy: Secondary | ICD-10-CM | POA: Insufficient documentation

## 2021-11-30 DIAGNOSIS — I5022 Chronic systolic (congestive) heart failure: Secondary | ICD-10-CM

## 2021-11-30 DIAGNOSIS — G4730 Sleep apnea, unspecified: Secondary | ICD-10-CM | POA: Diagnosis not present

## 2021-11-30 DIAGNOSIS — I10 Essential (primary) hypertension: Secondary | ICD-10-CM

## 2021-11-30 DIAGNOSIS — R5383 Other fatigue: Secondary | ICD-10-CM | POA: Diagnosis not present

## 2021-11-30 DIAGNOSIS — E785 Hyperlipidemia, unspecified: Secondary | ICD-10-CM | POA: Diagnosis not present

## 2021-11-30 DIAGNOSIS — K219 Gastro-esophageal reflux disease without esophagitis: Secondary | ICD-10-CM | POA: Diagnosis not present

## 2021-11-30 DIAGNOSIS — D649 Anemia, unspecified: Secondary | ICD-10-CM | POA: Insufficient documentation

## 2021-11-30 NOTE — Patient Instructions (Addendum)
Continue weighing daily and call for an overnight weight gain of 3 pounds or more or a weekly weight gain of more than 5 pounds.  ° °[facility] will be moving around the corner to [id number] mid-February. Our phone number will remain the same ° °

## 2021-12-14 ENCOUNTER — Telehealth: Payer: Self-pay | Admitting: Family

## 2021-12-14 NOTE — Telephone Encounter (Signed)
Patient was not approved for Jardiance Patient assistance as she should qualify for low income subsidy and needs to reach out to social security office to determine that.   Stephanie [name], NT

## 2022-01-06 NOTE — Progress Notes (Deleted)
Patient ID: Stephanie [name], female    DOB: [birth year], 75 y.o.   MRN: [id number]  HPI  Stephanie [name] is a 76 y/o female with a history of Dm, hyperlipidemia, HTN, thyroid disease, anemia, GERD, Parkinson's disease, sleep apnea and chronic heart failure.   Echo report from 07/21/21 reviewed and showed an EF of 25-30% along with severe MR/TR and mild AR.   Admitted 08/31/21 due to weakness, inability to walk and leg swelling due to acute on chronic heart failure. Initially given IV lasix with transition to oral diuretics. Given antibiotics due to concern about pneumonia. GI & psychiatry consults obtained.   Discharged after 7 days. Admitted 07/20/21 due to acute on chronic heart failure. Negative for PE. Initially given IV lasix with transition to oral medications. Cardiology consult obtained. Possible foreign body noted in esophagus from CT. SLP evaluation done. Discharged after 8 days. Was in the ED 07/06/21 after a mechanical fall where she was treated and released.   She presents today for a follow-up visit with chief complaint of minimal shortness of breath with moderate exertion. She describes this as chronic in nature having been present for several years. She has associated fatigue, intermittent blurry vision, pedal edema and light-headedness along with this. She denies any difficulty sleeping, abdominal distention, palpitations, chest pain, cough or weight gain.   She says that she was started on jardiance and given a 14 day coupon and didn't have any difficulty in taking it. She said she was told it would be >$100/ month and she would be unable to afford it.   Recently started on a new medication, entacapone (comitan) for her parkinson's. Says that she's only been on it a few days but thinks it may be helping already.   Past Medical History:  Diagnosis Date   Anemia    Arthritis    CHF (congestive heart failure) (HCC)    Chronic back pain    Diabetes mellitus without complication  (HCC)    Dysrhythmia    GERD (gastroesophageal reflux disease)    Hyperlipidemia    Hypertension    Hypothyroid    Multilevel degenerative disc disease    Parkinson's disease ([place])    Sleep apnea    Syncope    Tremors of nervous system    Wears dentures    full upper and lower   Past Surgical History:  Procedure Laterality Date   ABDOMINAL HYSTERECTOMY     BACK SURGERY  09/16   CARDIAC CATHETERIZATION     CATARACT EXTRACTION W/PHACO Right 11/24/2015   Procedure: CATARACT EXTRACTION PHACO AND INTRAOCULAR LENS PLACEMENT ([place]);  Surgeon: Estill Cotta, MD;  Location: [facility];  Service: Ophthalmology;  Laterality: Right;  [place] 01:14 AP% 25.5 CDE 30.69 fluid pack lot # [id number]   CATARACT EXTRACTION W/PHACO Left 01/06/2021   Procedure: CATARACT EXTRACTION PHACO AND INTRAOCULAR LENS PLACEMENT (IOC) LEFT DIABETIC 6.98 00:40.1;  Surgeon: Birder Robson, MD;  Location: [place];  Service: Ophthalmology;  Laterality: Left;  Diabetic - oral meds   COLONOSCOPY     COLONOSCOPY WITH PROPOFOL N/A 07/24/2019   Procedure: COLONOSCOPY WITH PROPOFOL;  Surgeon: Toledo, Benay Pike, MD;  Location: [facility];  Service: Gastroenterology;  Laterality: N/A;   ESOPHAGOGASTRODUODENOSCOPY N/A 07/24/2019   Procedure: ESOPHAGOGASTRODUODENOSCOPY (EGD);  Surgeon: Toledo, Benay Pike, MD;  Location: [facility];  Service: Gastroenterology;  Laterality: N/A;   OOPHORECTOMY     Family History  Problem Relation Age of Onset   Breast cancer Paternal 37  Social History   Tobacco Use   Smoking status: Never   Smokeless tobacco: Never  Substance Use Topics   Alcohol use: Not Currently    Comment: rare   No Known Allergies  Prior to Admission medications   Medication Sig Start Date End Date Taking? Authorizing Provider  allopurinol (ZYLOPRIM) 100 MG tablet Take 100 mg by mouth daily.   Yes [provider]  carbidopa-levodopa (SINEMET CR) 50-200 MG tablet Take 1 tablet by mouth  at bedtime.   Yes [provider]  carbidopa-levodopa (SINEMET IR) 25-100 MG tablet Take 2.5 tablets by mouth 4 (four) times daily.   Yes [provider]  Continuous Blood Gluc Sensor (FREESTYLE LIBRE 14 DAY SENSOR) MISC by Does not apply route.   Yes [provider]  entacapone (COMTAN) 200 MG tablet Take 200 mg by mouth 3 (three) times daily.   Yes [provider]  gabapentin (NEURONTIN) 300 MG capsule Take 300 mg by mouth at bedtime.   Yes [provider]  levothyroxine (SYNTHROID) 75 MCG tablet Take 75 mcg by mouth daily. 07/20/21 07/20/22 Yes [provider]  losartan (COZAAR) 50 MG tablet Take 1 tablet (50 mg total) by mouth daily. 07/29/21  Yes Sreenath, Sudheer B, MD  metFORMIN (GLUCOPHAGE) 1000 MG tablet Take 1,000 mg by mouth 2 (two) times daily with a meal.   Yes [provider]  metoprolol succinate (TOPROL-XL) 50 MG 24 hr tablet Take 50 mg by mouth daily. Take with or immediately following a meal.   Yes [provider]  Multiple Vitamin (MULTIVITAMIN WITH MINERALS) TABS tablet Take 1 tablet by mouth daily.   Yes [provider]  oxyCODONE-acetaminophen (PERCOCET/ROXICET) 5-325 MG tablet Take 1 tablet by mouth every 6 (six) hours as needed for severe pain. 09/07/21  Yes Donne Hazel, MD  pioglitazone (ACTOS) 30 MG tablet Take 30 mg by mouth daily. 11/10/20  Yes [provider]  pramipexole (MIRAPEX) 0.125 MG tablet Take 0.125 mg by mouth 4 (four) times daily.   Yes [provider]  pravastatin (PRAVACHOL) 40 MG tablet Take 40 mg by mouth daily.   Yes [provider]  QUEtiapine (SEROQUEL) 25 MG tablet Take 1 tablet (25 mg total) by mouth at bedtime. 09/07/21  Yes Donne Hazel, MD  sertraline (ZOLOFT) 25 MG tablet Take 1 tablet (25 mg total) by mouth daily. 09/08/21  Yes Donne Hazel, MD  fluticasone [facility]) 50 MCG/ACT nasal spray Place 1-2 sprays into the nose daily as needed  for allergies. 09/29/15   [provider]  furosemide (LASIX) 40 MG tablet Take 1 tablet (40 mg total) by mouth daily. Patient not taking: Reported on 11/30/2021 09/07/21 10/07/21  Donne Hazel, MD  glucose 4 GM chewable tablet Chew 1 tablet by mouth as needed for low blood sugar.    [provider]  glimepiride (AMARYL) 4 MG tablet Take 4 mg by mouth 2 (two) times daily. Pt. Takes 1 tablet in the morning and .5 tablet at night.  12/05/20  [provider]    Review of Systems  Constitutional:  Positive for fatigue. Negative for appetite change.  HENT:  Negative for congestion, postnasal drip and sore throat.   Eyes:  Positive for visual disturbance (blurry at times).  Respiratory:  Positive for shortness of breath. Negative for cough and chest tightness.   Cardiovascular:  Positive for leg swelling. Negative for chest pain and palpitations.  Gastrointestinal:  Negative for abdominal distention and abdominal  pain.  Endocrine: Negative.   Genitourinary: Negative.   Musculoskeletal:  Negative for back pain and neck pain.  Skin: Negative.   Allergic/Immunologic: Negative.   Neurological:  Positive for light-headedness. Negative for dizziness.  Hematological:  Negative for adenopathy. Does not bruise/bleed easily.  Psychiatric/Behavioral:  Negative for dysphoric mood and sleep disturbance (sleeping on 2-3 pillows). The patient is not nervous/anxious.    There were no vitals filed for this visit.  Wt Readings from Last 3 Encounters:  11/30/21 155 lb (70.3 kg)  09/07/21 162 lb 7.7 oz (73.7 kg)  08/06/21 158 lb 8 oz (71.9 kg)   Lab Results  Component Value Date   CREATININE 1.10 (H) 09/07/2021   CREATININE 0.61 09/06/2021   CREATININE 1.14 (H) 09/05/2021   Physical Exam Vitals and nursing note reviewed.  Constitutional:      Appearance: Normal appearance.  HENT:     Head: Normocephalic and atraumatic.  Cardiovascular:     Rate and Rhythm: Normal rate and  regular rhythm.  Pulmonary:     Effort: Pulmonary effort is normal. No respiratory distress.     Breath sounds: No wheezing or rales.  Abdominal:     General: There is no distension.     Palpations: Abdomen is soft.  Musculoskeletal:        General: No tenderness.     Cervical back: Normal range of motion and neck supple.     Right lower leg: Edema (trace pitting) present.     Left lower leg: Edema (1+ pitting) present.  Skin:    General: Skin is warm and dry.     Findings: No bruising.  Neurological:     General: No focal deficit present.     Mental Status: She is alert and oriented to person, place, and time.  Psychiatric:        Mood and Affect: Mood normal.        Behavior: Behavior normal.        Thought Content: Thought content normal.    Assessment & Plan:  1: Chronic heart failure with reduced ejection fraction- - NYHA class II - euvolemic today - weighing daily; advised to call for an overnight weight gain of > 2 pounds or a weekly weight gain of > 5 pounds - weight down 3 pounds from last visit here 3 months ago - not adding salt and her daughter is doing most of the cooking and hasn't been using salt either - advised to keep daily fluid intake to between 60-64 ounces/ day - saw cardiology Petra Kuba) 10/08/21 - on GDMT of metoprolol succinate, torsemide, and losartan - says that she was put on jardiance with a 14 day coupon but she was unable to afford it; had patient fill out patient assistance for jardiance and if approved, she will resume it as '10mg'$  once daily - could also change her losartan to entresto but since she just started a new parkinson's medication a few days ago, will hold off on this; consider spironolactone in the future as well - BNP 08/31/21 was 991.1  2: HTN- - BP mildly elevated (140/70) although it's difficult to get a good reading due to her dyskinesia - saw PCP 10/06/21 - CMP 10/06/21 reviewed and showed sodium 135, potassium 4.5, creatinine 1.10  and GFR 52  3: DM- - A1c 07/17/21 was 8.3% - glucose at home today was 138  4: Parkinson's- - saw neurology Manuella Ghazi) 11/20/21 - just started entacapone a few days ago   Medication bottles  reviewed.   Return in 6 weeks, sooner if needed.

## 2022-01-07 ENCOUNTER — Other Ambulatory Visit: Payer: Self-pay

## 2022-01-07 ENCOUNTER — Ambulatory Visit: Payer: Medicare Other | Attending: Family | Admitting: Family

## 2022-01-07 ENCOUNTER — Ambulatory Visit: Payer: Medicare Other | Admitting: Family

## 2022-01-07 ENCOUNTER — Encounter: Payer: Self-pay | Admitting: Family

## 2022-01-07 ENCOUNTER — Telehealth: Payer: Self-pay | Admitting: Family

## 2022-01-07 VITALS — BP 149/90 | HR 60 | Resp 16 | Ht 61.0 in | Wt 151.0 lb

## 2022-01-07 DIAGNOSIS — G20 Parkinson's disease: Secondary | ICD-10-CM | POA: Diagnosis not present

## 2022-01-07 DIAGNOSIS — I890 Lymphedema, not elsewhere classified: Secondary | ICD-10-CM | POA: Diagnosis not present

## 2022-01-07 DIAGNOSIS — I5022 Chronic systolic (congestive) heart failure: Secondary | ICD-10-CM | POA: Diagnosis present

## 2022-01-07 DIAGNOSIS — G20A1 Parkinson's disease without dyskinesia, without mention of fluctuations: Secondary | ICD-10-CM

## 2022-01-07 DIAGNOSIS — K219 Gastro-esophageal reflux disease without esophagitis: Secondary | ICD-10-CM | POA: Diagnosis not present

## 2022-01-07 DIAGNOSIS — E785 Hyperlipidemia, unspecified: Secondary | ICD-10-CM | POA: Diagnosis not present

## 2022-01-07 DIAGNOSIS — I110 Hypertensive heart disease with heart failure: Secondary | ICD-10-CM | POA: Diagnosis not present

## 2022-01-07 DIAGNOSIS — I10 Essential (primary) hypertension: Secondary | ICD-10-CM | POA: Diagnosis not present

## 2022-01-07 DIAGNOSIS — G4730 Sleep apnea, unspecified: Secondary | ICD-10-CM | POA: Insufficient documentation

## 2022-01-07 DIAGNOSIS — Z7984 Long term (current) use of oral hypoglycemic drugs: Secondary | ICD-10-CM | POA: Diagnosis not present

## 2022-01-07 DIAGNOSIS — E119 Type 2 diabetes mellitus without complications: Secondary | ICD-10-CM

## 2022-01-07 MED ORDER — LOSARTAN POTASSIUM 100 MG PO TABS: 100.0000 mg | ORAL_TABLET | Freq: Every day | ORAL | 3 refills | Status: AC

## 2022-01-07 NOTE — Telephone Encounter (Signed)
Spoke with patients daughter charlote this morning who called and cancelled patients appointment as she was unable to drive her due to illness and stated that she needed someone to drive her. Patient showed up anyways after driving from [place] alone so we put patient back on schedule and notified both her daughters that she did in fact show up for her appointment in case someone needed to be aware. ? ? ?Trudie Cervantes, NT ?

## 2022-01-07 NOTE — Patient Instructions (Addendum)
If you have voicemail, please make sure your mailbox is cleaned out so that we may leave a message and please make sure to listen to any voicemails.   ? ?Get some compression socks, your legs are swollen today, this will help with that. ? ?Continue to weigh daily. ? ?Keep your fluid intake to 64 ounce/day. ? ?Take 2 losartan daily.  ? ?Return in 1 month.  ?

## 2022-01-07 NOTE — Progress Notes (Signed)
Patient ID: Stephanie [name], female    DOB: 19-May-1946, 76 y.o.   MRN: [id number]  Stephanie [name] is a 76 y/o female with a history of DM, hyperlipidemia, HTN, thyroid disease, anemia, GERD, Parkinson's disease, sleep apnea and chronic heart failure.   Echo report from 07/21/21 reviewed and showed an EF of 25-30% along with severe MR/TR and mild AR.   Admitted 08/31/21 due to weakness, inability to walk and leg swelling due to acute on chronic heart failure. Initially given IV lasix with transition to oral diuretics. Given antibiotics due to concern about pneumonia. GI & psychiatry consults obtained.   Discharged after 7 days. Admitted 07/20/21 due to acute on chronic heart failure. Negative for PE. Initially given IV lasix with transition to oral medications. Cardiology consult obtained. Possible foreign body noted in esophagus from CT. SLP evaluation done. Discharged after 8 days.   She presents today for a follow-up visit with chief complaint of minimal shortness of breath with moderate exertion. She describes this as chronic in nature having been present for several years. She has associated fatigue, difficulty sleeping, and pedal edema along with this. She denies any  abdominal distention, palpitations, chest pain, cough or weight gain.   She is weighing daily and checking her BP at home. She reports several "high" BP readings.   Past Medical History:  Diagnosis Date   Anemia    Arthritis    CHF (congestive heart failure) (HCC)    Chronic back pain    Diabetes mellitus without complication (HCC)    Dysrhythmia    GERD (gastroesophageal reflux disease)    Hyperlipidemia    Hypertension    Hypothyroid    Multilevel degenerative disc disease    Parkinson's disease ([place])    Sleep apnea    Syncope    Tremors of nervous system    Wears dentures    full upper and lower   Past Surgical History:  Procedure Laterality Date   ABDOMINAL HYSTERECTOMY     BACK SURGERY  09/16   CARDIAC  CATHETERIZATION     CATARACT EXTRACTION W/PHACO Right 11/24/2015   Procedure: CATARACT EXTRACTION PHACO AND INTRAOCULAR LENS PLACEMENT ([place]);  Surgeon: Estill Cotta, MD;  Location: [facility];  Service: Ophthalmology;  Laterality: Right;  [place] 01:14 AP% 25.5 CDE 30.69 fluid pack lot # [id number]   CATARACT EXTRACTION W/PHACO Left 01/06/2021   Procedure: CATARACT EXTRACTION PHACO AND INTRAOCULAR LENS PLACEMENT (IOC) LEFT DIABETIC 6.98 00:40.1;  Surgeon: Birder Robson, MD;  Location: [place];  Service: Ophthalmology;  Laterality: Left;  Diabetic - oral meds   COLONOSCOPY     COLONOSCOPY WITH PROPOFOL N/A 07/24/2019   Procedure: COLONOSCOPY WITH PROPOFOL;  Surgeon: Toledo, Benay Pike, MD;  Location: [facility];  Service: Gastroenterology;  Laterality: N/A;   ESOPHAGOGASTRODUODENOSCOPY N/A 07/24/2019   Procedure: ESOPHAGOGASTRODUODENOSCOPY (EGD);  Surgeon: Toledo, Benay Pike, MD;  Location: [facility];  Service: Gastroenterology;  Laterality: N/A;   OOPHORECTOMY     Family History  Problem Relation Age of Onset   Breast cancer Paternal Aunt    Social History   Tobacco Use   Smoking status: Never   Smokeless tobacco: Never  Substance Use Topics   Alcohol use: Not Currently    Comment: rare   No Known Allergies  Prior to Admission medications   Medication Sig Start Date End Date Taking? Authorizing Provider  allopurinol (ZYLOPRIM) 100 MG tablet Take 100 mg by mouth daily.   Yes [provider]  carbidopa-levodopa (  SINEMET CR) 50-200 MG tablet Take 1 tablet by mouth at bedtime.   Yes [provider]  carbidopa-levodopa (SINEMET IR) 25-100 MG tablet Take 2.5 tablets by mouth 4 (four) times daily.   Yes [provider]  Continuous Blood Gluc Sensor (FREESTYLE LIBRE 14 DAY SENSOR) MISC by Does not apply route.   Yes [provider]  entacapone (COMTAN) 200 MG tablet Take 200 mg by mouth 3 (three) times daily.   Yes [provider]   gabapentin (NEURONTIN) 300 MG capsule Take 300 mg by mouth at bedtime.   Yes [provider]  levothyroxine (SYNTHROID) 75 MCG tablet Take 75 mcg by mouth daily. 07/20/21 07/20/22 Yes [provider]  losartan (COZAAR) 50 MG tablet Take 1 tablet (50 mg total) by mouth daily. 07/29/21  Yes Sreenath, Sudheer B, MD  metFORMIN (GLUCOPHAGE) 1000 MG tablet Take 1,000 mg by mouth 2 (two) times daily with a meal.   Yes [provider]  metoprolol succinate (TOPROL-XL) 50 MG 24 hr tablet Take 50 mg by mouth daily. Take with or immediately following a meal.   Yes [provider]  Multiple Vitamin (MULTIVITAMIN WITH MINERALS) TABS tablet Take 1 tablet by mouth daily.   Yes [provider]  oxyCODONE-acetaminophen (PERCOCET/ROXICET) 5-325 MG tablet Take 1 tablet by mouth every 6 (six) hours as needed for severe pain. 09/07/21  Yes Donne Hazel, MD  pioglitazone (ACTOS) 30 MG tablet Take 30 mg by mouth daily. 11/10/20  Yes [provider]  pramipexole (MIRAPEX) 0.125 MG tablet Take 0.125 mg by mouth 4 (four) times daily.   Yes [provider]  pravastatin (PRAVACHOL) 40 MG tablet Take 40 mg by mouth daily.   Yes [provider]  QUEtiapine (SEROQUEL) 25 MG tablet Take 1 tablet (25 mg total) by mouth at bedtime. 09/07/21  Yes Donne Hazel, MD  sertraline (ZOLOFT) 25 MG tablet Take 1 tablet (25 mg total) by mouth daily. 09/08/21  Yes Donne Hazel, MD  fluticasone [facility]) 50 MCG/ACT nasal spray Place 1-2 sprays into the nose daily as needed for allergies. 09/29/15   [provider]  furosemide (LASIX) 40 MG tablet Take 1 tablet (40 mg total) by mouth daily. Patient not taking: Reported on 11/30/2021 09/07/21 10/07/21  Donne Hazel, MD  glucose 4 GM chewable tablet Chew 1 tablet by mouth as needed for low blood sugar.    [provider]  glimepiride (AMARYL) 4 MG tablet Take 4 mg by mouth 2 (two) times daily. Pt. Takes 1  tablet in the morning and .5 tablet at night.  12/05/20  [provider]    Review of Systems  Constitutional:  Positive for fatigue. Negative for appetite change.  HENT:  Negative for congestion, postnasal drip and sore throat.   Respiratory:  Positive for shortness of breath. Negative for cough and chest tightness.   Cardiovascular:  Positive for leg swelling. Negative for chest pain and palpitations.  Gastrointestinal:  Negative for abdominal distention and abdominal pain.  Endocrine: Negative.   Genitourinary: Negative.   Musculoskeletal:  Negative for back pain and neck pain.  Skin: Negative.   Allergic/Immunologic: Negative.   Neurological:  Positive for tremors. Negative for dizziness and light-headedness.  Hematological:  Negative for adenopathy. Does not bruise/bleed easily.  Psychiatric/Behavioral:  Positive for sleep disturbance (sleeping on 2-3 pillows). Negative for dysphoric mood. The patient is not nervous/anxious.    Vitals:   01/07/22 1417  BP: (!) 149/90  Pulse:  60  Resp: 16  SpO2: 98%  Weight: 151 lb (68.5 kg)  Height: '5\' 1"'$  (1.549 m)    Wt Readings from Last 3 Encounters:  01/07/22 151 lb (68.5 kg)  11/30/21 155 lb (70.3 kg)  09/07/21 162 lb 7.7 oz (73.7 kg)   Lab Results  Component Value Date   CREATININE 1.10 (H) 09/07/2021   CREATININE 0.61 09/06/2021   CREATININE 1.14 (H) 09/05/2021   Physical Exam Vitals and nursing note reviewed.  Constitutional:      General: She is not in acute distress.    Appearance: Normal appearance.  HENT:     Head: Normocephalic and atraumatic.  Cardiovascular:     Rate and Rhythm: Normal rate and regular rhythm.  Pulmonary:     Effort: Pulmonary effort is normal. No respiratory distress.     Breath sounds: No wheezing or rales.  Abdominal:     General: There is no distension.     Palpations: Abdomen is soft.  Musculoskeletal:        General: No tenderness.     Cervical back: Normal range of motion and  neck supple.     Right lower leg: Edema (+2) present.     Left lower leg: Edema (+2) present.  Skin:    General: Skin is warm and dry.     Findings: No bruising.  Neurological:     General: No focal deficit present.     Mental Status: She is alert and oriented to person, place, and time.  Psychiatric:        Mood and Affect: Mood normal.        Behavior: Behavior normal.        Thought Content: Thought content normal.    Assessment & Plan:  1: Chronic heart failure with reduced ejection fraction- - NYHA class II - euvolemic today - weighing daily; advised to call for an overnight weight gain of > 2 pounds or a weekly weight gain of > 5 pounds - weight down 4 pounds from last visit here on 11/30/21 - not adding salt and her daughter is doing most of the cooking and hasn't been using salt either - advised to keep daily fluid intake to between 60-64 ounces/ day - saw cardiology Petra Kuba) 12/10/21 - on GDMT of metoprolol succinate, torsemide, and losartan - was on Jardiance, cannot afford it and she is not eligible for patient assistance - may qualify for entresto, will discuss at next visit to see if she is eligible for patient assistance - BNP 08/31/21 was 991.1  2: HTN- - BP 149/90, with other recent elevated readings at other provider appts - will increase cozaar to 100 mg QD - will see PCP ([place]) later today  - CMP 10/06/21 reviewed and showed sodium 135, potassium 4.5, creatinine 1.10 and GFR 52  3: DM- - A1c 07/17/21 was 8.3% - glucose at home today was 70  4: Parkinson's- - saw neurology Manuella Ghazi) 11/20/21  5: Lymphedema- - legs with +2 edema and red, advised to get compression socks, elevate her feet when possible, and watch fluid/sodium intake   Medication bottles reviewed.   Return in 1 month, sooner if needed.

## 2022-01-21 ENCOUNTER — Ambulatory Visit: Payer: Medicare Other | Admitting: Family
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
## 2022-02-08 ENCOUNTER — Encounter: Payer: Self-pay | Admitting: Family

## 2022-02-08 ENCOUNTER — Ambulatory Visit: Payer: Medicare Other | Attending: Family | Admitting: Family

## 2022-02-08 ENCOUNTER — Telehealth: Payer: Self-pay | Admitting: Family

## 2022-02-08 VITALS — BP 183/117 | HR 76 | Resp 16 | Ht 60.0 in | Wt 146.5 lb

## 2022-02-08 DIAGNOSIS — G20A1 Parkinson's disease without dyskinesia, without mention of fluctuations: Secondary | ICD-10-CM

## 2022-02-08 DIAGNOSIS — E785 Hyperlipidemia, unspecified: Secondary | ICD-10-CM | POA: Diagnosis not present

## 2022-02-08 DIAGNOSIS — R440 Auditory hallucinations: Secondary | ICD-10-CM | POA: Diagnosis not present

## 2022-02-08 DIAGNOSIS — D649 Anemia, unspecified: Secondary | ICD-10-CM | POA: Diagnosis not present

## 2022-02-08 DIAGNOSIS — E079 Disorder of thyroid, unspecified: Secondary | ICD-10-CM | POA: Diagnosis not present

## 2022-02-08 DIAGNOSIS — E119 Type 2 diabetes mellitus without complications: Secondary | ICD-10-CM | POA: Diagnosis not present

## 2022-02-08 DIAGNOSIS — I10 Essential (primary) hypertension: Secondary | ICD-10-CM | POA: Diagnosis not present

## 2022-02-08 DIAGNOSIS — K219 Gastro-esophageal reflux disease without esophagitis: Secondary | ICD-10-CM | POA: Insufficient documentation

## 2022-02-08 DIAGNOSIS — I5022 Chronic systolic (congestive) heart failure: Secondary | ICD-10-CM | POA: Diagnosis present

## 2022-02-08 DIAGNOSIS — G20 Parkinson's disease: Secondary | ICD-10-CM | POA: Insufficient documentation

## 2022-02-08 DIAGNOSIS — I110 Hypertensive heart disease with heart failure: Secondary | ICD-10-CM | POA: Diagnosis not present

## 2022-02-08 DIAGNOSIS — G4730 Sleep apnea, unspecified: Secondary | ICD-10-CM | POA: Diagnosis not present

## 2022-02-08 LAB — BASIC METABOLIC PANEL
Anion gap: 8 (ref 5–15)
BUN: 22 mg/dL (ref 8–23)
CO2: 26 mmol/L (ref 22–32)
Calcium: 8.8 mg/dL — ABNORMAL LOW (ref 8.9–10.3)
Chloride: 105 mmol/L (ref 98–111)
Creatinine, Ser: 0.76 mg/dL (ref 0.44–1.00)
GFR, Estimated: >60 mL/min (ref >60)
Glucose, Bld: 138 mg/dL — ABNORMAL HIGH (ref 70–99)
Potassium: 4.2 mmol/L (ref 3.5–5.1)
Sodium: 139 mmol/L (ref 135–145)

## 2022-02-08 NOTE — Patient Instructions (Signed)
Continue weighing daily and call for an overnight weight gain of 3 pounds or more or a weekly weight gain of more than 5 pounds.   If you have voicemail, please make sure your mailbox is cleaned out so that we may leave a message and please make sure to listen to any voicemails.     

## 2022-02-08 NOTE — Telephone Encounter (Signed)
Late entry, phone call placed at 1630: ? ?Called patient's daughter, Butch Penny, that she lives with to discuss patient's auditory hallucinations and my concern regarding this. Explained the things that the patient said in the office today (read my office note for details) and that I was concerned enough to recommend going to the ED.  ? ?Butch Penny says that she's been aware of the hallucinations and that it most likely is a side effect of one of her Parkinson medications that she needs. Butch Penny says that a medication may have to be given to combat the hallucination side effect. Butch Penny says that patient is taking her diuretic but not every day.  ? ?Butch Penny didn't think the hallucinations were much different lately and didn't feel an ED visit was needed. I explained, again, my concerns and that patient was not expressing these at her last visit with me 1 month ago. I also advised Butch Penny that I had reached out to patient's neurologist and PCP about these hallucinations.  ? ?Butch Penny was appreciative of the phone call to her as well as my reaching out to the other providers. Per telephone note from 01/14/22, neurology was decreasing comtan pill due to visual hallucinations. To take patient to the ED if she expresses homicidal / suicidal thoughts.  ?

## 2022-02-12 ENCOUNTER — Emergency Department (HOSPITAL_COMMUNITY): Payer: Medicare Other

## 2022-02-12 ENCOUNTER — Inpatient Hospital Stay (HOSPITAL_COMMUNITY)
Admission: EM | Admit: 2022-02-12 | Discharge: 2022-02-16 | DRG: 391 | Disposition: A | Discharge status: Skilled Nursing Facility | Payer: Medicare Other | Attending: Internal Medicine | Admitting: Internal Medicine

## 2022-02-12 ENCOUNTER — Encounter (HOSPITAL_COMMUNITY): Payer: Self-pay | Admitting: Internal Medicine

## 2022-02-12 ENCOUNTER — Inpatient Hospital Stay (HOSPITAL_COMMUNITY): Payer: Medicare Other

## 2022-02-12 ENCOUNTER — Other Ambulatory Visit: Payer: Self-pay

## 2022-02-12 DIAGNOSIS — R531 Weakness: Secondary | ICD-10-CM | POA: Diagnosis not present

## 2022-02-12 DIAGNOSIS — I110 Hypertensive heart disease with heart failure: Secondary | ICD-10-CM | POA: Diagnosis present

## 2022-02-12 DIAGNOSIS — I5020 Unspecified systolic (congestive) heart failure: Secondary | ICD-10-CM | POA: Diagnosis present

## 2022-02-12 DIAGNOSIS — G4733 Obstructive sleep apnea (adult) (pediatric): Secondary | ICD-10-CM | POA: Diagnosis present

## 2022-02-12 DIAGNOSIS — R471 Dysarthria and anarthria: Secondary | ICD-10-CM | POA: Diagnosis present

## 2022-02-12 DIAGNOSIS — I6381 Other cerebral infarction due to occlusion or stenosis of small artery: Secondary | ICD-10-CM | POA: Diagnosis not present

## 2022-02-12 DIAGNOSIS — R4781 Slurred speech: Principal | ICD-10-CM

## 2022-02-12 DIAGNOSIS — Z8701 Personal history of pneumonia (recurrent): Secondary | ICD-10-CM

## 2022-02-12 DIAGNOSIS — Z79899 Other long term (current) drug therapy: Secondary | ICD-10-CM

## 2022-02-12 DIAGNOSIS — Z7984 Long term (current) use of oral hypoglycemic drugs: Secondary | ICD-10-CM | POA: Diagnosis not present

## 2022-02-12 DIAGNOSIS — I361 Nonrheumatic tricuspid (valve) insufficiency: Secondary | ICD-10-CM | POA: Diagnosis not present

## 2022-02-12 DIAGNOSIS — R1313 Dysphagia, pharyngeal phase: Principal | ICD-10-CM | POA: Diagnosis present

## 2022-02-12 DIAGNOSIS — R1310 Dysphagia, unspecified: Secondary | ICD-10-CM

## 2022-02-12 DIAGNOSIS — M5030 Other cervical disc degeneration, unspecified cervical region: Secondary | ICD-10-CM | POA: Diagnosis present

## 2022-02-12 DIAGNOSIS — R29703 NIHSS score 3: Secondary | ICD-10-CM | POA: Diagnosis present

## 2022-02-12 DIAGNOSIS — Z20822 Contact with and (suspected) exposure to covid-19: Secondary | ICD-10-CM | POA: Diagnosis present

## 2022-02-12 DIAGNOSIS — E785 Hyperlipidemia, unspecified: Secondary | ICD-10-CM

## 2022-02-12 DIAGNOSIS — Z8673 Personal history of transient ischemic attack (TIA), and cerebral infarction without residual deficits: Secondary | ICD-10-CM

## 2022-02-12 DIAGNOSIS — I5022 Chronic systolic (congestive) heart failure: Secondary | ICD-10-CM | POA: Diagnosis present

## 2022-02-12 DIAGNOSIS — G20 Parkinson's disease: Secondary | ICD-10-CM | POA: Diagnosis present

## 2022-02-12 DIAGNOSIS — E119 Type 2 diabetes mellitus without complications: Secondary | ICD-10-CM

## 2022-02-12 DIAGNOSIS — E039 Hypothyroidism, unspecified: Secondary | ICD-10-CM | POA: Diagnosis present

## 2022-02-12 DIAGNOSIS — M549 Dorsalgia, unspecified: Secondary | ICD-10-CM | POA: Diagnosis present

## 2022-02-12 DIAGNOSIS — R6510 Systemic inflammatory response syndrome (SIRS) of non-infectious origin without acute organ dysfunction: Secondary | ICD-10-CM | POA: Diagnosis present

## 2022-02-12 DIAGNOSIS — G8194 Hemiplegia, unspecified affecting left nondominant side: Secondary | ICD-10-CM | POA: Diagnosis present

## 2022-02-12 DIAGNOSIS — E1169 Type 2 diabetes mellitus with other specified complication: Secondary | ICD-10-CM | POA: Diagnosis present

## 2022-02-12 DIAGNOSIS — R443 Hallucinations, unspecified: Secondary | ICD-10-CM | POA: Diagnosis present

## 2022-02-12 DIAGNOSIS — Z79891 Long term (current) use of opiate analgesic: Secondary | ICD-10-CM

## 2022-02-12 DIAGNOSIS — G2581 Restless legs syndrome: Secondary | ICD-10-CM | POA: Diagnosis present

## 2022-02-12 DIAGNOSIS — J189 Pneumonia, unspecified organism: Secondary | ICD-10-CM

## 2022-02-12 DIAGNOSIS — M545 Low back pain, unspecified: Secondary | ICD-10-CM | POA: Diagnosis not present

## 2022-02-12 DIAGNOSIS — I340 Nonrheumatic mitral (valve) insufficiency: Secondary | ICD-10-CM | POA: Diagnosis not present

## 2022-02-12 DIAGNOSIS — R051 Acute cough: Secondary | ICD-10-CM

## 2022-02-12 DIAGNOSIS — E876 Hypokalemia: Secondary | ICD-10-CM | POA: Diagnosis present

## 2022-02-12 DIAGNOSIS — E1142 Type 2 diabetes mellitus with diabetic polyneuropathy: Secondary | ICD-10-CM

## 2022-02-12 DIAGNOSIS — I351 Nonrheumatic aortic (valve) insufficiency: Secondary | ICD-10-CM

## 2022-02-12 DIAGNOSIS — R29810 Facial weakness: Secondary | ICD-10-CM | POA: Diagnosis present

## 2022-02-12 DIAGNOSIS — F419 Anxiety disorder, unspecified: Secondary | ICD-10-CM | POA: Diagnosis present

## 2022-02-12 DIAGNOSIS — Z79890 Hormone replacement therapy (postmenopausal): Secondary | ICD-10-CM | POA: Diagnosis not present

## 2022-02-12 DIAGNOSIS — G8929 Other chronic pain: Secondary | ICD-10-CM | POA: Diagnosis present

## 2022-02-12 DIAGNOSIS — Z794 Long term (current) use of insulin: Secondary | ICD-10-CM | POA: Diagnosis not present

## 2022-02-12 DIAGNOSIS — K219 Gastro-esophageal reflux disease without esophagitis: Secondary | ICD-10-CM | POA: Diagnosis present

## 2022-02-12 LAB — I-STAT CHEM 8, ED
BUN: 30 mg/dL — ABNORMAL HIGH (ref 8–23)
Calcium, Ion: 1.13 mmol/L — ABNORMAL LOW (ref 1.15–1.40)
Chloride: 102 mmol/L (ref 98–111)
Creatinine, Ser: 0.6 mg/dL (ref 0.44–1.00)
Glucose, Bld: 93 mg/dL (ref 70–99)
HCT: 41 % (ref 36.0–46.0)
Hemoglobin: 13.9 g/dL (ref 12.0–15.0)
Potassium: 4.2 mmol/L (ref 3.5–5.1)
Sodium: 139 mmol/L (ref 135–145)
TCO2: 28 mmol/L (ref 22–32)

## 2022-02-12 LAB — ECHOCARDIOGRAM COMPLETE
AR max vel: 1.96 cm2
AV Peak grad: 8.9 mmHg
Ao pk vel: 1.49 m/s
Area-P 1/2: 4.15 cm2
Calc EF: 23.1 %
Height: 60 in
MV M vel: 5.82 m/s
MV Peak grad: 135.6 mmHg
P 1/2 time: 215 msec
S' Lateral: 4 cm
Single Plane A2C EF: 24.4 %
Single Plane A4C EF: 22.6 %
Weight: 2409.19 oz

## 2022-02-12 LAB — PROTIME-INR
INR: 1.3 — ABNORMAL HIGH (ref 0.8–1.2)
Prothrombin Time: 15.9 seconds — ABNORMAL HIGH (ref 11.4–15.2)

## 2022-02-12 LAB — APTT: aPTT: 34 seconds (ref 24–36)

## 2022-02-12 LAB — RAPID URINE DRUG SCREEN, HOSP PERFORMED
Amphetamines: NOT DETECTED
Barbiturates: NOT DETECTED
Benzodiazepines: NOT DETECTED
Cocaine: NOT DETECTED
Opiates: NOT DETECTED
Tetrahydrocannabinol: NOT DETECTED

## 2022-02-12 LAB — BRAIN NATRIURETIC PEPTIDE: B Natriuretic Peptide: 523.4 pg/mL — ABNORMAL HIGH (ref 0.0–100.0)

## 2022-02-12 LAB — DIFFERENTIAL
Abs Immature Granulocytes: 0.11 10*3/uL — ABNORMAL HIGH (ref 0.00–0.07)
Basophils Absolute: 0.1 10*3/uL (ref 0.0–0.1)
Basophils Relative: 0 %
Eosinophils Absolute: 0.1 10*3/uL (ref 0.0–0.5)
Eosinophils Relative: 1 %
Immature Granulocytes: 1 %
Lymphocytes Relative: 3 %
Lymphs Abs: 0.4 10*3/uL — ABNORMAL LOW (ref 0.7–4.0)
Monocytes Absolute: 0.6 10*3/uL (ref 0.1–1.0)
Monocytes Relative: 4 %
Neutro Abs: 12.7 10*3/uL — ABNORMAL HIGH (ref 1.7–7.7)
Neutrophils Relative %: 91 %

## 2022-02-12 LAB — CBC
HCT: 40 % (ref 36.0–46.0)
Hemoglobin: 12.7 g/dL (ref 12.0–15.0)
MCH: 29.6 pg (ref 26.0–34.0)
MCHC: 31.8 g/dL (ref 30.0–36.0)
MCV: 93.2 fL (ref 80.0–100.0)
Platelets: 214 10*3/uL (ref 150–400)
RBC: 4.29 MIL/uL (ref 3.87–5.11)
RDW: 16.2 % — ABNORMAL HIGH (ref 11.5–15.5)
WBC: 13.9 10*3/uL — ABNORMAL HIGH (ref 4.0–10.5)
nRBC: 0 % (ref 0.0–0.2)

## 2022-02-12 LAB — URINALYSIS, ROUTINE W REFLEX MICROSCOPIC
Bilirubin Urine: NEGATIVE
Glucose, UA: NEGATIVE mg/dL
Hgb urine dipstick: NEGATIVE
Ketones, ur: 5 mg/dL — AB
Leukocytes,Ua: NEGATIVE
Nitrite: NEGATIVE
Protein, ur: 30 mg/dL — AB
Specific Gravity, Urine: >1.046 — ABNORMAL HIGH (ref 1.005–1.030)
pH: 5 (ref 5.0–8.0)

## 2022-02-12 LAB — COMPREHENSIVE METABOLIC PANEL
ALT: 8 U/L (ref 0–44)
AST: 18 U/L (ref 15–41)
Albumin: 3.2 g/dL — ABNORMAL LOW (ref 3.5–5.0)
Alkaline Phosphatase: 88 U/L (ref 38–126)
Anion gap: 9 (ref 5–15)
BUN: 21 mg/dL (ref 8–23)
CO2: 23 mmol/L (ref 22–32)
Calcium: 8.8 mg/dL — ABNORMAL LOW (ref 8.9–10.3)
Chloride: 103 mmol/L (ref 98–111)
Creatinine, Ser: 0.84 mg/dL (ref 0.44–1.00)
GFR, Estimated: >60 mL/min (ref >60)
Glucose, Bld: 105 mg/dL — ABNORMAL HIGH (ref 70–99)
Potassium: 4.1 mmol/L (ref 3.5–5.1)
Sodium: 135 mmol/L (ref 135–145)
Total Bilirubin: 1.5 mg/dL — ABNORMAL HIGH (ref 0.3–1.2)
Total Protein: 7 g/dL (ref 6.5–8.1)

## 2022-02-12 LAB — CBG MONITORING, ED: Glucose-Capillary: 156 mg/dL — ABNORMAL HIGH (ref 70–99)

## 2022-02-12 LAB — LACTIC ACID, PLASMA
Lactic Acid, Venous: 1.3 mmol/L (ref 0.5–1.9)
Lactic Acid, Venous: 1.7 mmol/L (ref 0.5–1.9)

## 2022-02-12 LAB — GLUCOSE, CAPILLARY: Glucose-Capillary: 96 mg/dL (ref 70–99)

## 2022-02-12 LAB — LIPID PANEL
Cholesterol: 85 mg/dL (ref 0–200)
HDL: 38 mg/dL — ABNORMAL LOW (ref >40)
LDL Cholesterol: 38 mg/dL (ref 0–99)
Total CHOL/HDL Ratio: 2.2 RATIO
Triglycerides: 43 mg/dL (ref <150)
VLDL: 9 mg/dL (ref 0–40)

## 2022-02-12 LAB — TROPONIN I (HIGH SENSITIVITY): Troponin I (High Sensitivity): 21 ng/L — ABNORMAL HIGH (ref <18)

## 2022-02-12 LAB — HEMOGLOBIN A1C
Hgb A1c MFr Bld: 6.3 % — ABNORMAL HIGH (ref 4.8–5.6)
Mean Plasma Glucose: 134.11 mg/dL

## 2022-02-12 LAB — RESP PANEL BY RT-PCR (FLU A&B, COVID) ARPGX2
Influenza A by PCR: NEGATIVE
Influenza B by PCR: NEGATIVE
SARS Coronavirus 2 by RT PCR: NEGATIVE

## 2022-02-12 LAB — ETHANOL: Alcohol, Ethyl (B): <10 mg/dL (ref <10)

## 2022-02-12 LAB — PROCALCITONIN: Procalcitonin: 3.65 ng/mL

## 2022-02-12 LAB — TSH: TSH: 6.348 u[IU]/mL — ABNORMAL HIGH (ref 0.350–4.500)

## 2022-02-12 MED ORDER — FUROSEMIDE 10 MG/ML IJ SOLN
40.0000 mg | Freq: Two times a day (BID) | INTRAMUSCULAR | Status: DC
  Administered 2022-02-12 – 2022-02-15 (×7): 40 mg via INTRAVENOUS
  Filled 2022-02-12 (×7): qty 4

## 2022-02-12 MED ORDER — LEVOTHYROXINE SODIUM 75 MCG PO TABS
75.0000 ug | ORAL_TABLET | Freq: Every day | ORAL | Status: DC
  Administered 2022-02-14 – 2022-02-16 (×3): 75 ug via ORAL
  Filled 2022-02-12 (×3): qty 1

## 2022-02-12 MED ORDER — IOHEXOL 350 MG/ML SOLN
75.0000 mL | Freq: Once | INTRAVENOUS | Status: AC | PRN
  Administered 2022-02-12: 75 mL via INTRAVENOUS

## 2022-02-12 MED ORDER — ACETAMINOPHEN 325 MG PO TABS
650.0000 mg | ORAL_TABLET | ORAL | Status: DC | PRN
  Administered 2022-02-14: 650 mg via ORAL
  Filled 2022-02-12: qty 2

## 2022-02-12 MED ORDER — CARBIDOPA-LEVODOPA 25-100 MG PO TABS
2.5000 | ORAL_TABLET | Freq: Four times a day (QID) | ORAL | Status: DC
  Administered 2022-02-13 – 2022-02-16 (×12): 2.5 via ORAL
  Filled 2022-02-12 (×2): qty 3
  Filled 2022-02-12: qty 2.5
  Filled 2022-02-12 (×2): qty 3
  Filled 2022-02-12: qty 2.5
  Filled 2022-02-12 (×9): qty 3

## 2022-02-12 MED ORDER — SODIUM CHLORIDE 0.9 % IV SOLN
100.0000 mg | Freq: Two times a day (BID) | INTRAVENOUS | Status: DC
  Administered 2022-02-12 – 2022-02-16 (×9): 100 mg via INTRAVENOUS
  Filled 2022-02-12 (×11): qty 100

## 2022-02-12 MED ORDER — ENTACAPONE 200 MG PO TABS
100.0000 mg | ORAL_TABLET | Freq: Three times a day (TID) | ORAL | Status: DC
Start: 2022-02-12 — End: 2022-02-12
  Filled 2022-02-12 (×3): qty 0.5

## 2022-02-12 MED ORDER — ENTACAPONE 200 MG PO TABS
100.0000 mg | ORAL_TABLET | Freq: Three times a day (TID) | ORAL | Status: DC
  Administered 2022-02-13 – 2022-02-16 (×10): 100 mg via ORAL
  Filled 2022-02-12 (×15): qty 0.5

## 2022-02-12 MED ORDER — ACETAMINOPHEN 160 MG/5ML PO SOLN: 650.0000 mg | ORAL | Status: DC | PRN

## 2022-02-12 MED ORDER — STROKE: EARLY STAGES OF RECOVERY BOOK: Freq: Once | Status: DC

## 2022-02-12 MED ORDER — ASPIRIN 325 MG PO TABS
325.0000 mg | ORAL_TABLET | Freq: Every day | ORAL | Status: DC
  Administered 2022-02-13 – 2022-02-16 (×4): 325 mg via ORAL
  Filled 2022-02-12 (×4): qty 1

## 2022-02-12 MED ORDER — PRAVASTATIN SODIUM 40 MG PO TABS
40.0000 mg | ORAL_TABLET | Freq: Every day | ORAL | Status: DC
  Administered 2022-02-13 – 2022-02-16 (×4): 40 mg via ORAL
  Filled 2022-02-12 (×4): qty 1

## 2022-02-12 MED ORDER — METOPROLOL TARTRATE 5 MG/5ML IV SOLN
2.5000 mg | Freq: Three times a day (TID) | INTRAVENOUS | Status: DC
  Administered 2022-02-13: 2.5 mg via INTRAVENOUS
  Filled 2022-02-12: qty 5

## 2022-02-12 MED ORDER — GABAPENTIN 300 MG PO CAPS
300.0000 mg | ORAL_CAPSULE | Freq: Every day | ORAL | Status: DC
  Administered 2022-02-13 – 2022-02-15 (×3): 300 mg via ORAL
  Filled 2022-02-12 (×4): qty 1

## 2022-02-12 MED ORDER — CARBIDOPA-LEVODOPA ER 50-200 MG PO TBCR
1.0000 | EXTENDED_RELEASE_TABLET | Freq: Every day | ORAL | Status: DC
  Administered 2022-02-13 – 2022-02-15 (×3): 1 via ORAL
  Filled 2022-02-12 (×6): qty 1

## 2022-02-12 MED ORDER — OXYCODONE-ACETAMINOPHEN 5-325 MG PO TABS
1.0000 | ORAL_TABLET | Freq: Four times a day (QID) | ORAL | Status: DC | PRN
  Filled 2022-02-12: qty 1

## 2022-02-12 MED ORDER — MORPHINE SULFATE (PF) 2 MG/ML IV SOLN
1.0000 mg | INTRAVENOUS | Status: DC | PRN
  Administered 2022-02-12 – 2022-02-13 (×3): 2 mg via INTRAVENOUS
  Administered 2022-02-14: 1 mg via INTRAVENOUS
  Filled 2022-02-12 (×4): qty 1

## 2022-02-12 MED ORDER — METOPROLOL SUCCINATE ER 25 MG PO TB24: 50.0000 mg | ORAL_TABLET | Freq: Every day | ORAL | Status: DC

## 2022-02-12 MED ORDER — ACETAMINOPHEN 650 MG RE SUPP: 650.0000 mg | RECTAL | Status: DC | PRN

## 2022-02-12 MED ORDER — INSULIN ASPART 100 UNIT/ML IJ SOLN
0.0000 [IU] | Freq: Three times a day (TID) | INTRAMUSCULAR | Status: DC
  Administered 2022-02-12: 2 [IU] via SUBCUTANEOUS
  Administered 2022-02-13 – 2022-02-14 (×2): 1 [IU] via SUBCUTANEOUS
  Administered 2022-02-14: 2 [IU] via SUBCUTANEOUS
  Administered 2022-02-15: 3 [IU] via SUBCUTANEOUS
  Administered 2022-02-16: 1 [IU] via SUBCUTANEOUS
  Administered 2022-02-16: 3 [IU] via SUBCUTANEOUS

## 2022-02-12 MED ORDER — ENOXAPARIN SODIUM 40 MG/0.4ML IJ SOSY: 40.0000 mg | PREFILLED_SYRINGE | Freq: Every day | INTRAMUSCULAR | Status: DC

## 2022-02-12 MED ORDER — LORAZEPAM 2 MG/ML IJ SOLN: 0.5000 mg | Freq: Four times a day (QID) | INTRAMUSCULAR | Status: DC | PRN

## 2022-02-12 MED ORDER — SERTRALINE HCL 25 MG PO TABS
25.0000 mg | ORAL_TABLET | Freq: Every day | ORAL | Status: DC
Start: 2022-02-12 — End: 2022-02-16
  Administered 2022-02-13 – 2022-02-16 (×4): 25 mg via ORAL
  Filled 2022-02-12 (×4): qty 1

## 2022-02-12 MED ORDER — SODIUM CHLORIDE 0.9 % IV SOLN
3.0000 g | Freq: Four times a day (QID) | INTRAVENOUS | Status: DC
  Administered 2022-02-12 – 2022-02-16 (×16): 3 g via INTRAVENOUS
  Filled 2022-02-12 (×20): qty 8

## 2022-02-12 MED ORDER — PRAMIPEXOLE DIHYDROCHLORIDE 0.125 MG PO TABS
0.1250 mg | ORAL_TABLET | Freq: Four times a day (QID) | ORAL | Status: DC
  Administered 2022-02-13 – 2022-02-16 (×13): 0.125 mg via ORAL
  Filled 2022-02-12 (×21): qty 1

## 2022-02-12 MED ORDER — LOSARTAN POTASSIUM 50 MG PO TABS
100.0000 mg | ORAL_TABLET | Freq: Every day | ORAL | Status: DC
  Administered 2022-02-13 – 2022-02-16 (×4): 100 mg via ORAL
  Filled 2022-02-12 (×4): qty 2

## 2022-02-12 MED ORDER — FLUTICASONE PROPIONATE 50 MCG/ACT NA SUSP: 1.0000 | Freq: Every day | NASAL | Status: DC | PRN

## 2022-02-12 MED ORDER — QUETIAPINE FUMARATE 50 MG PO TABS
50.0000 mg | ORAL_TABLET | Freq: Every day | ORAL | Status: DC
  Administered 2022-02-13 – 2022-02-15 (×3): 50 mg via ORAL
  Filled 2022-02-12 (×5): qty 1

## 2022-02-12 NOTE — Progress Notes (Signed)
Pharmacy Antibiotic Note ? ?Stephanie [name] is a 76 y.o. female admitted on 02/12/2022 presenting with generalized weakness.  Pharmacy has been consulted for Unasyn dosing. ? ?Plan: ?Unasyn 3g IV q 6h ?Monitor renal function, clinical progression and LOT vs ability to transition to PO ? ?Height: 5' (152.4 cm) ?Weight: 68.3 kg (150 lb 9.2 oz) ?IBW/kg (Calculated) : 45.5 ? ?Temp (24hrs), Avg:99.1 ?F (37.3 ?C), Min:99.1 ?F (37.3 ?C), Max:99.1 ?F (37.3 ?C) ? ?Recent Labs  ?Lab 02/08/22 ?1548 02/12/22 ?2951 02/12/22 ?8841  ?WBC  --  13.9*  --   ?CREATININE 0.76 0.84 0.60  ?  ?Estimated Creatinine Clearance: 52.4 mL/min (by C-G formula based on SCr of 0.6 mg/dL).   ? ?No Known Allergies ? ?Bertis Ruddy, PharmD ?Clinical Pharmacist ?ED Pharmacist Phone # [phone] ?02/12/2022 8:25 AM ? ? ?

## 2022-02-12 NOTE — Progress Notes (Signed)
The patient is NPO and she has no IV pain medication Notified Dr, Alcario Drought and received a new order for Morphine IV.  Order has been implemented. Will continue to monitor. ?

## 2022-02-12 NOTE — ED Provider Notes (Signed)
?[facility] ?[facility] Emergency Department ?Provider Note ?MRN:  [id number]  ?Arrival date & time: 02/12/22    ? ?Chief Complaint   ?Stroke symptoms ?History of Present Illness   ?ELEXUS BARMAN is a 76 y.o. year-old female with a history of CHF, diabetes presenting to the ED with chief complaint of stroke symptoms. ? ?Reportedly went to bed normal at 9:30 PM, now experiencing headache, left-sided neck pain, slurred speech.  Feeling generally weak, report of possible facial droop. ? ?Review of Systems  ?A thorough review of systems was obtained and all systems are negative except as noted in the HPI and PMH.  ? ?Patient's Health History   ? ?Past Medical History:  ?Diagnosis Date  ? Anemia   ? Arthritis   ? CHF (congestive heart failure) ([place])   ? Chronic back pain   ? Diabetes mellitus without complication ([place])   ? Dysrhythmia   ? GERD (gastroesophageal reflux disease)   ? Hyperlipidemia   ? Hypertension   ? Hypothyroid   ? Multilevel degenerative disc disease   ? Parkinson's disease ([place])   ? Sleep apnea   ? Syncope   ? Tremors of nervous system   ? Wears dentures   ? full upper and lower  ?  ?Past Surgical History:  ?Procedure Laterality Date  ? ABDOMINAL HYSTERECTOMY    ? BACK SURGERY  09/16  ? CARDIAC CATHETERIZATION    ? CATARACT EXTRACTION W/PHACO Right 11/24/2015  ? Procedure: CATARACT EXTRACTION PHACO AND INTRAOCULAR LENS PLACEMENT (IOC);  Surgeon: Estill Cotta, MD;  Location: [facility];  Service: Ophthalmology;  Laterality: Right;  [place] 01:14 ?AP% 25.5 ?CDE 30.69 ?fluid pack lot # [id number]  ? CATARACT EXTRACTION W/PHACO Left 01/06/2021  ? Procedure: CATARACT EXTRACTION PHACO AND INTRAOCULAR LENS PLACEMENT (IOC) LEFT DIABETIC 6.98 00:40.1;  Surgeon: Birder Robson, MD;  Location: [place];  Service: Ophthalmology;  Laterality: Left;  Diabetic - oral meds  ? COLONOSCOPY    ? COLONOSCOPY WITH PROPOFOL N/A 07/24/2019  ? Procedure: COLONOSCOPY WITH PROPOFOL;  Surgeon: Toledo,  Benay Pike, MD;  Location: [facility];  Service: Gastroenterology;  Laterality: N/A;  ? ESOPHAGOGASTRODUODENOSCOPY N/A 07/24/2019  ? Procedure: ESOPHAGOGASTRODUODENOSCOPY (EGD);  Surgeon: Toledo, Benay Pike, MD;  Location: [facility];  Service: Gastroenterology;  Laterality: N/A;  ? OOPHORECTOMY    ?  ?Family History  ?Problem Relation Age of Onset  ? Breast cancer Paternal Aunt   ?  ?Social History  ? ?Socioeconomic History  ? Marital status: Divorced  ?  Spouse name: Not on file  ? Number of children: Not on file  ? Years of education: Not on file  ? Highest education level: Not on file  ?Occupational History  ? Not on file  ?Tobacco Use  ? Smoking status: Never  ? Smokeless tobacco: Never  ?Vaping Use  ? Vaping Use: Never used  ?Substance and Sexual Activity  ? Alcohol use: Not Currently  ?  Comment: rare  ? Drug use: No  ? Sexual activity: Not Currently  ?Other Topics Concern  ? Not on file  ?Social History Narrative  ? Not on file  ? ?Social Determinants of Health  ? ?Financial Resource Strain: Not on file  ?Food Insecurity: Not on file  ?Transportation Needs: Not on file  ?Physical Activity: Not on file  ?Stress: Not on file  ?Social Connections: Not on file  ?Intimate Partner Violence: Not on file  ?  ? ?Physical Exam  ? ?Vitals:  ? 02/12/22 1700  02/12/22 0615  ?BP: (!) 155/73 127/85  ?Pulse: 89 80  ?Resp: 19 (!) 22  ?Temp:    ?SpO2: 94% 90%  ?  ?CONSTITUTIONAL: Chronically ill-appearing, NAD ?NEURO/PSYCH:  Alert and oriented x 3, generalized weakness but no asymmetry of motor abilities to the arms or legs, normal sensation, slurred speech, no obvious facial droop, no visual field cuts, no aphasia, no neglect ?EYES:  eyes equal and reactive ?ENT/NECK:  no LAD, no JVD ?CARDIO: Regular rate, well-perfused, normal S1 and S2 ?PULM:  CTAB no wheezing or rhonchi ?GI/GU:  non-distended, non-tender ?MSK/SPINE:  No gross deformities, no edema ?SKIN:  no rash, atraumatic ? ? ?*Additional and/or pertinent findings  included in MDM below ? ?Diagnostic and Interventional Summary  ? ? EKG Interpretation ? ?Date/Time:  Friday February 12 2022 05:58:57 EDT ?Ventricular Rate:  86 ?PR Interval:  152 ?QRS Duration: 150 ?QT Interval:  406 ?QTC Calculation: 486 ?R Axis:   -80 ?Text Interpretation: Sinus rhythm Multiple premature complexes, vent & supraven Nonspecific IVCD with LAD LVH with secondary repolarization abnormality Inferior infarct, old Confirmed by Gerlene Fee [phone]) on 02/12/2022 6:16:39 AM ?  ? ?  ? ?Labs Reviewed  ?PROTIME-INR - Abnormal; Notable for the following components:  ?    Result Value  ? Prothrombin Time 15.9 (*)   ? INR 1.3 (*)   ? All other components within normal limits  ?CBC - Abnormal; Notable for the following components:  ? WBC 13.9 (*)   ? RDW 16.2 (*)   ? All other components within normal limits  ?DIFFERENTIAL - Abnormal; Notable for the following components:  ? Neutro Abs 12.7 (*)   ? Lymphs Abs 0.4 (*)   ? Abs Immature Granulocytes 0.11 (*)   ? All other components within normal limits  ?COMPREHENSIVE METABOLIC PANEL - Abnormal; Notable for the following components:  ? Glucose, Bld 105 (*)   ? Calcium 8.8 (*)   ? Albumin 3.2 (*)   ? Total Bilirubin 1.5 (*)   ? All other components within normal limits  ?RESP PANEL BY RT-PCR (FLU A&B, COVID) ARPGX2  ?ETHANOL  ?APTT  ?RAPID URINE DRUG SCREEN, HOSP PERFORMED  ?URINALYSIS, ROUTINE W REFLEX MICROSCOPIC  ?BRAIN NATRIURETIC PEPTIDE  ?I-STAT CHEM 8, ED  ?  ?DG Chest Port 1 View  ?Final Result  ?  ?CT ANGIO HEAD NECK W WO CM    (Results Pending)  ?  ?Medications - No data to display  ? ?Procedures  /  Critical Care ?.Critical Care ?Performed by: Maudie Flakes, MD ?Authorized by: Maudie Flakes, MD  ? ?Critical care provider statement:  ?  Critical care time (minutes):  45 ?  Critical care was necessary to treat or prevent imminent or life-threatening deterioration of the following conditions: Concern for acute ischemic stroke. ?  Critical care was time  spent personally by me on the following activities:  Development of treatment plan with patient or surrogate, discussions with consultants, evaluation of patient's response to treatment, examination of patient, ordering and review of laboratory studies, ordering and review of radiographic studies, ordering and performing treatments and interventions, pulse oximetry, re-evaluation of patient's condition and review of old charts ? ?ED Course and Medical Decision Making  ?Initial Impression and Ddx ?Slurred speech, generalized weakness, question of facial droop noted earlier.  DDx includes stroke, TIA, metabolic disarray, electrolyte disturbance, intracranial bleeding or mass, with the neck pain also considering carotid dissection.  With last known normal of almost 9 hours ago  and with patient exhibiting no signs of aphasia or visual field cuts or neglect, she is not a tPA candidate and code stroke initiation is not indicated.  Will obtain rapid CTA head and neck, awaiting labs. ? ?Past medical/surgical history that increases complexity of ED encounter: Heart failure, Parkinson's, diabetes ? ?Interpretation of Diagnostics ?I personally reviewed the EKG and my interpretation is as follows: Sinus rhythm, bundle branch block, largely unchanged ?   ?Labs pending ? ?Patient Reassessment and Ultimate Disposition/Management ?Suspect patient will need MRI, possibly admission.  Signed out to oncoming provider. ? ?Patient management required discussion with the following services or consulting groups:  None ? ?Complexity of Problems Addressed ?Acute illness or injury that poses threat of life of bodily function ? ?Additional Data Reviewed and Analyzed ?Further history obtained from: ?EMS on arrival ? ?Additional Factors Impacting ED Encounter Risk ?Consideration of hospitalization ? ?Barth Kirks. Sedonia Small, MD ?[facility] ?[email or web address] ? ?Final Clinical Impressions(s) / ED  Diagnoses  ? ?  ICD-10-CM   ?1. Slurred speech  R47.81   ?  ?  ?ED Discharge Orders   ? ? None  ? ?  ?  ? ?Discharge Instructions Discussed with and Provided to Patient:  ? ?Discharge Instructions   ?None ?  ? ?

## 2022-02-12 NOTE — ED Notes (Signed)
Pt brought back from MRI with out testing. MRI stated pt would not sit still. MD notified  ?

## 2022-02-12 NOTE — ED Notes (Signed)
Pt unable to sign MSE form at this time  ?

## 2022-02-12 NOTE — Consult Note (Addendum)
Neurology Consultation ?Reason for Consult: headache, left-sided neck pain and generalized weakness ?Requesting Physician: Gerlene Fee ? ?CC: Can't get enough air ? ?History is obtained from: Patient and chart review  ? ?HPI: Stephanie [name] is a 76 y.o. female with a past medical history significant for diabetes, hypertension, hyperlipidemia, obstructive sleep apnea, Parkinson's disease, degenerative disc disease status post lower back surgery, chronic back pain, congestive heart failure. ? ?History is slightly limited by the patient feeling extremely short of breath.  However she reports that she has been having worsening left-sided weakness for months to me and bilateral lower leg tingling 4 weeks.  She has had 3 falls in the last week to where she hit her head.  She does not know if she was falling to 1 side or another but does note she has been dropping things with the left hand.  She does not know of any history of irregular heart rate or atrial fibrillation.  She reports she has not been missing any of her medications including her Sinemet, that she has had some intermittent nausea and vomiting.  She also notes some double vision on looking into the right which she thinks may be new.  She has not had any difficulty with her speech or language that she knows of. ? ?Daughter [name] confirms that she is having hallucinations worsened by Entacapone. She needs some help at home, but is generally able to get to the bathroom etc. She reports left sided weakness that is new as of yesterday. Normally not confused, but 2 days of some confusion. Confirms she takes percocet for her back pain typically 1x a day and pamiprexole 0.125 four times a day.  ? ? ?LKW: weeks to months PTA ?tPA given?: No, out of the window ?Premorbid modified rankin scale: 2 ?    2 - Slight disability. Able to look after own affairs without assistance, but unable to carry out all previous activities. ? ? ?ROS: As documented in  HPI ? ?Past Medical History:  ?Diagnosis Date  ? Anemia   ? Arthritis   ? CHF (congestive heart failure) ([place])   ? Chronic back pain   ? Diabetes mellitus without complication ([place])   ? Dysrhythmia   ? GERD (gastroesophageal reflux disease)   ? Hyperlipidemia   ? Hypertension   ? Hypothyroid   ? Multilevel degenerative disc disease   ? Parkinson's disease ([place])   ? Sleep apnea   ? Syncope   ? Tremors of nervous system   ? Wears dentures   ? full upper and lower  ? ?Past Surgical History:  ?Procedure Laterality Date  ? ABDOMINAL HYSTERECTOMY    ? BACK SURGERY  09/16  ? CARDIAC CATHETERIZATION    ? CATARACT EXTRACTION W/PHACO Right 11/24/2015  ? Procedure: CATARACT EXTRACTION PHACO AND INTRAOCULAR LENS PLACEMENT (IOC);  Surgeon: Estill Cotta, MD;  Location: [facility];  Service: Ophthalmology;  Laterality: Right;  [place] 01:14 ?AP% 25.5 ?CDE 30.69 ?fluid pack lot # [id number]  ? CATARACT EXTRACTION W/PHACO Left 01/06/2021  ? Procedure: CATARACT EXTRACTION PHACO AND INTRAOCULAR LENS PLACEMENT (IOC) LEFT DIABETIC 6.98 00:40.1;  Surgeon: Birder Robson, MD;  Location: [facility];  Service: Ophthalmology;  Laterality: Left;  Diabetic - oral meds  ? COLONOSCOPY    ? COLONOSCOPY WITH PROPOFOL N/A 07/24/2019  ? Procedure: COLONOSCOPY WITH PROPOFOL;  Surgeon: Toledo, Benay Pike, MD;  Location: [facility];  Service: Gastroenterology;  Laterality: N/A;  ? ESOPHAGOGASTRODUODENOSCOPY N/A 07/24/2019  ? Procedure: ESOPHAGOGASTRODUODENOSCOPY (  EGD);  Surgeon: Toledo, Benay Pike, MD;  Location: [facility];  Service: Gastroenterology;  Laterality: N/A;  ? OOPHORECTOMY    ? ?Current Outpatient Medications  ?Medication Instructions  ? allopurinol (ZYLOPRIM) 100 mg, Oral, Daily  ? carbidopa-levodopa (SINEMET CR) 50-200 MG tablet 1 tablet, Oral, Daily at bedtime  ? carbidopa-levodopa (SINEMET IR) 25-100 MG tablet 2.5 tablets, Oral, 4 times daily  ? Continuous Blood Gluc Sensor (FREESTYLE LIBRE 14 DAY SENSOR) MISC Does not apply   ? entacapone (COMTAN) 100 mg, Oral, 3 times daily  ? fluticasone (FLONASE) 50 MCG/ACT nasal spray 1-2 sprays, Nasal, Daily PRN  ? furosemide (LASIX) 40 mg, Oral, Daily  ? gabapentin (NEURONTIN) 300 mg, Oral, Daily at bedtime  ? glucose 4 GM chewable tablet 1 tablet, Oral, As needed  ? ibuprofen (ADVIL) 600 mg, Oral, Every 6 hours PRN  ? levothyroxine (SYNTHROID) 75 mcg, Oral, Daily  ? losartan (COZAAR) 100 mg, Oral, Daily  ? metFORMIN (GLUCOPHAGE) 1,000 mg, Oral, 2 times daily with meals  ? metoprolol succinate (TOPROL-XL) 50 mg, Oral, Daily, Take with or immediately following a meal.  ? Multiple Vitamin (MULTIVITAMIN WITH MINERALS) TABS tablet 1 tablet, Oral, Daily  ? oxyCODONE-acetaminophen (PERCOCET/ROXICET) 5-325 MG tablet 1 tablet, Oral, Every 6 hours PRN  ? pramipexole (MIRAPEX) 0.125 mg, Oral, 4 times daily  ? pravastatin (PRAVACHOL) 40 mg, Oral, Daily  ? QUEtiapine (SEROQUEL) 50 mg, Oral, Daily at bedtime  ? sertraline (ZOLOFT) 25 mg, Oral, Daily  ? torsemide (DEMADEX) 40 mg, Oral, Daily PRN  ? ? ?Family History  ?Problem Relation Age of Onset  ? Breast cancer Paternal Aunt   ? ? ?Social History:  reports that she has never smoked. She has never used smokeless tobacco. She reports that she does not currently use alcohol. She reports that she does not use drugs. ? ? ?Exam: ?Current vital signs: ?BP 127/85   Pulse 80   Temp 99.1 ?F (37.3 ?C) (Oral)   Resp (!) 22   Ht 5' (1.524 m)   Wt 68.3 kg   SpO2 90%   BMI 29.41 kg/m?  ?Vital signs in last 24 hours: ?Temp:  [99.1 ?F (37.3 ?C)] 99.1 ?F (37.3 ?C) (04/14 0556) ?Pulse Rate:  [80-89] 80 (04/14 0615) ?Resp:  [19-22] 22 (04/14 0615) ?BP: (127-155)/(73-85) 127/85 (04/14 0615) ?SpO2:  [90 %-95 %] 90 % (04/14 0615) ?Weight:  [68.3 kg] 68.3 kg (04/14 0604) ? ? ?Physical Exam  ?Constitutional: Appears restless, acutely ill,  ?Psych: Affect appropriate to situation, pleasant and cooperative  ?Eyes: No scleral injection ?HENT: No oropharyngeal obstruction. Dry  mucus membranes ?MSK: no major joint deformities.  ?Cardiovascular: Irregularly irregular and tachy to 150s intermittently on monitor with occ PVCs ?Respiratory: Extremely tachypneic, very short of breath, desats to 88 when she removes oxygen after a few minutes ?GI: Soft.  No distension. There is no tenderness to palpation though she reported a cramping pain during the course of my evaluation ?Skin: Mild bilateral shin erythema.  Bruising on the right knee and 1 lesion on the right shin ? ?Neuro: ?Mental Status: ?Patient is awake, alert, oriented to person, place, month, year, and situation. ?Patient is able to give a clear and coherent history for the most part although she gives a variable/inconsistent time course for her varied symptoms ?No signs of aphasia or neglect ?Cranial Nerves: ?II: Visual Fields are full. Pupils are equal, round, and reactive to light.   ?III,IV, VI: EOMI without ptosis, but she reports double vision on right  gaze ?V: Facial sensation is symmetric to light touch ?VII: Facial movement is notable for a mild left nasolabial fold flattening.  ?VIII: hearing is intact to voice ?X: Uvula elevates symmetrically ?XI: Shoulder shrug is symmetric. ?XII: tongue is midline without atrophy or fasciculations.  ?Motor: ?Tone is increased in the left upper extremity compared to the right. Bulk is normal. 5/5 strength was present in all four extremities except for deltoid 4/5 on the left and hip flexion 4/5 on the left ?Sensory: ?Sensation is reduced in the left leg ?Deep Tendon Reflexes: ?2+ and symmetric in the brachioradialis, 2+ in the left patella but absent in the right patella ?Plantars: ?Toes are upgoing bilaterally ?Cerebellar: ?FNF and HKS are intact bilaterally ?Gait:  ?Deferred  ?NIHSS total 3 ? ?Score breakdown: One-point for mild left nasolabial fold flattening, one-point for left lower extremity sensory loss, one-point for mild dysarthria ?Performed at 11:45 time of patient arrival to ED   ? ? ?I have reviewed labs in epic and the results pertinent to this consultation are: ? ?Basic Metabolic Panel: ?Recent Labs  ?Lab 02/08/22 ?1548 02/12/22 ?2671 02/12/22 ?2458  ?NA 139 135 139  ?K 4.2 4.1 4.2  ?CL 105 10

## 2022-02-12 NOTE — ED Notes (Signed)
In MRI

## 2022-02-12 NOTE — ED Provider Notes (Signed)
? ?  7:19 AM ?Patient signed out to me by previous ED physician.  ?Pt is a 76 yo female presenting for generalized weakness and reported left sided facial droop upon waking this morning. Last known normal 9PM last night. No previous strokes. No blood thinner use. ? ?Plan: f/u MRI ?Physical Exam  ?BP 127/85   Pulse 80   Temp 99.1 ?F (37.3 ?C) (Oral)   Resp (!) 22   Ht 5' (1.524 m)   Wt 68.3 kg   SpO2 90%   BMI 29.41 kg/m?  ? ?Physical Exam ?Vitals and nursing note reviewed.  ?Constitutional:   ?   Appearance: Normal appearance.  ?Cardiovascular:  ?   Rate and Rhythm: Normal rate and regular rhythm.  ?Pulmonary:  ?   Effort: Pulmonary effort is normal.  ?   Breath sounds: Normal breath sounds.  ?Skin: ?   Capillary Refill: Capillary refill takes less than 2 seconds.  ?Neurological:  ?   General: No focal deficit present.  ?   Mental Status: She is alert and oriented to person, place, and time.  ?   GCS: GCS eye subscore is 4. GCS verbal subscore is 5. GCS motor subscore is 6.  ?   Cranial Nerves: Dysarthria and facial asymmetry present.  ?   Sensory: No sensory deficit.  ?   Motor: Weakness present.  ?   Comments: Left upper extremity weakness, drift-hits the bed  ? ? ?Procedures  ?Procedures ? ?ED Course / MDM  ?  ?Medical Decision Making ?Amount and/or Complexity of Data Reviewed ?Labs: ordered. ?Radiology: ordered. ? ?Risk ?OTC drugs. ?Prescription drug management. ?Decision regarding hospitalization. ? ? ? ?8:07 AM ?Patient is alert and oriented x3, no acute distress, afebrile, stable vital signs.  Pt has left sided facial droop on my exam with dyarthria. Left upper extremity weakness. Daughter states she has been having weakness and has been dropping things over the last few weeks due to leg and arm weakness (nonspecific side) but family attributed to hx of parkinson's disease and recurrent weakness. Admits to hx of difficulty ambulating and weakness in left arm that started weeks ago stating "I kept  dropping things". ? ?NIH stroke scale 6. ?No blood thinner use ?Last known normal 2100 yesterday ? ?CTA head and neck demonstrates: ?1. 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 Otherwise stable CT appearance of chronic small ?vessel disease. ?2. New left upper lobe lung opacity since last year, suspicious for ?Acute Pneumonia. ?3. CTA is negative for large vessel occlusion. 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 No other Significant arterial stenosis in the head ?or neck. ?4. Advanced chronic cervical spine degeneration. ? ?Neurology on consult. Recommends MRI. I ordered MRI and will f/u on results. Aspirin given ? ? ?Also has tachypnea of 22 leukocytosis of 13.5.  Hx of a cough.  Reported vital signs appear hypoxic at 90% however patient not on home oxygen of 3 L.  Liters nasal cannula started with improvement of oxygenation.  With current CTA findings of possible pneumonia I think it is fair to start antibiotic coverage at this time.  No recent hospitalizations.  Unasyn and doxycycline ordered. ? ? ?Patient recommended for admission for follow-up with her evaluation by neurology.  I spoke with admitting physician who agrees to accept patient.  ?  ? ? ? ? ? ?  ?Lianne Cure, DO ?50/27/74 [id number] ? ?

## 2022-02-12 NOTE — Progress Notes (Signed)
Pt arrived to MRI via pt transport for exam. Upon assessing pt, pt states they cannot lay flat on back for exam. Pt also continuously shaking and moving in pain. Pt requesting meds for pain. Attempted to call RN for meds but no answer. Unable to scan pt at this time due to continuous motion and pain. Pt sent back to ED via pt transport. ?

## 2022-02-12 NOTE — H&P (Addendum)
History and Physical    Patient: Stephanie [name] ZOX:[id number] DOB: February 12, 1946 DOA: 02/12/2022 DOS: the patient was seen and examined on 02/12/2022 PCP: Marguarite Arbour, MD  Patient coming from: Home lives with daughter via EMS  Chief Complaint:  Chief Complaint  Patient presents with   Stroke Symptoms    BIBA from home. Lives with daughter. Pt c/o being unable to ambulate at home, L sided facial droop, slurred speech -per daughter.LKW @2130    HPI: Stephanie [name] is a 76 y.o. female with medical history significant of hypertension, hyperlipidemia, HFrEF EF 25-30%, diabetes mellitus type 2, Parkinson disease, history of dysphagia, anxiety, hypothyroidism, chronic back pain, sleep apnea on 2 L of nasal cannula oxygen at night who presented due to worsening weakness, slurred speech, and confusion.  History is obtained from the patient, her daughter over the phone, and review of records.  Last  reported be normal around 9:30 PM last night.  Patient reports that she has been having worsening left-sided weakness  for several weeks to months of complaints of bilateral leg tingling.  Associated symptoms included reports of falls, back pain, nausea, vomiting, and changes in vision.  Patient reports that her speech is changed due to her mouth being so dry.  Daughter states that with her Parkinson's is not unusual for her to have some weakness.  Entacapone started about 2 months ago due to the Sinemet wearing off and patient developing stiffness and being unable to move.  Daughter notes that  entacapone had seemed to be helping, but patient has developed hallucinations as a result of this.  Her daughter reports that the last time she is aware that the patient following was over a month ago.  Upon admission into the emergency department patient was seen to have a temperature of 99.1 F, respirations 19-22, and all other vital signs relatively maintained.  Labs significant for WBC 13.9 and BNP 523.4.   Fluids and COVID-19 screening were negative.  Chest x-ray noted acute interstitial edema superimposed on perihilar lung scarring and atelectasis with possible acute viral/atypical respiratory infection on the differential.  CTA of the head and neck noted age-indeterminate left basal ganglia lacunar infarct new since last year and new left upper lobe opacity suspicious for acute pneumonia.  Neurology have been formally consulted.  Patient had been given full dose aspirin 325 mg, doxycycline, and Unasyn.  Patient failed swallow evaluation.   Review of Systems: As mentioned in the history of present illness. All other systems reviewed and are negative. Past Medical History:  Diagnosis Date   Anemia    Arthritis    CHF (congestive heart failure) (HCC)    Chronic back pain    Diabetes mellitus without complication (HCC)    Dysrhythmia    GERD (gastroesophageal reflux disease)    Hyperlipidemia    Hypertension    Hypothyroid    Multilevel degenerative disc disease    Parkinson's disease (HCC)    Sleep apnea    Syncope    Tremors of nervous system    Wears dentures    full upper and lower   Past Surgical History:  Procedure Laterality Date   ABDOMINAL HYSTERECTOMY     BACK SURGERY  09/16   CARDIAC CATHETERIZATION     CATARACT EXTRACTION W/PHACO Right 11/24/2015   Procedure: CATARACT EXTRACTION PHACO AND INTRAOCULAR LENS PLACEMENT (IOC);  Surgeon: Sallee Lange, MD;  Location: [facility] ORS;  Service: Ophthalmology;  Laterality: Right;  [place] 01:14 AP% 25.5 CDE 30.69 fluid pack  lot # [id number]   CATARACT EXTRACTION W/PHACO Left 01/06/2021   Procedure: CATARACT EXTRACTION PHACO AND INTRAOCULAR LENS PLACEMENT (IOC) LEFT DIABETIC 6.98 00:40.1;  Surgeon: Galen Manila, MD;  Location: [facility];  Service: Ophthalmology;  Laterality: Left;  Diabetic - oral meds   COLONOSCOPY     COLONOSCOPY WITH PROPOFOL N/A 07/24/2019   Procedure: COLONOSCOPY WITH PROPOFOL;  Surgeon: Toledo, Boykin Nearing,  MD;  Location: [facility];  Service: Gastroenterology;  Laterality: N/A;   ESOPHAGOGASTRODUODENOSCOPY N/A 07/24/2019   Procedure: ESOPHAGOGASTRODUODENOSCOPY (EGD);  Surgeon: Toledo, Boykin Nearing, MD;  Location: [facility];  Service: Gastroenterology;  Laterality: N/A;   OOPHORECTOMY     Social History:  reports that she has never smoked. She has never used smokeless tobacco. She reports that she does not currently use alcohol. She reports that she does not use drugs.  No Known Allergies  Family History  Problem Relation Age of Onset   Breast cancer Paternal Aunt     Prior to Admission medications   Medication Sig Start Date End Date Taking? Authorizing Provider  allopurinol (ZYLOPRIM) 100 MG tablet Take 100 mg by mouth daily.   Yes [provider]  carbidopa-levodopa (SINEMET CR) 50-200 MG tablet Take 1 tablet by mouth at bedtime.   Yes [provider]  carbidopa-levodopa (SINEMET IR) 25-100 MG tablet Take 2.5 tablets by mouth 4 (four) times daily.   Yes [provider]  Continuous Blood Gluc Sensor (FREESTYLE LIBRE 14 DAY SENSOR) MISC by Does not apply route.   Yes [provider]  entacapone (COMTAN) 200 MG tablet Take 100 mg by mouth 3 (three) times daily.   Yes [provider]  fluticasone (FLONASE) 50 MCG/ACT nasal spray Place 1-2 sprays into the nose daily as needed for allergies. 09/29/15  Yes [provider]  gabapentin (NEURONTIN) 300 MG capsule Take 300 mg by mouth at bedtime.   Yes [provider]  glucose 4 GM chewable tablet Chew 1 tablet by mouth as needed for low blood sugar.   Yes [provider]  ibuprofen (ADVIL) 200 MG tablet Take 600 mg by mouth every 6 (six) hours as needed for moderate pain or headache.   Yes [provider]  levothyroxine (SYNTHROID) 75 MCG tablet Take 75 mcg by mouth daily. 07/20/21 07/20/22 Yes [provider]  losartan (COZAAR) 100 MG tablet Take 1 tablet  (100 mg total) by mouth daily. 01/07/22 04/07/22 Yes Hackney, Jarold Song, FNP  metFORMIN (GLUCOPHAGE) 1000 MG tablet Take 1,000 mg by mouth 2 (two) times daily with a meal.   Yes [provider]  metoprolol succinate (TOPROL-XL) 50 MG 24 hr tablet Take 50 mg by mouth daily. Take with or immediately following a meal.   Yes [provider]  Multiple Vitamin (MULTIVITAMIN WITH MINERALS) TABS tablet Take 1 tablet by mouth daily.   Yes [provider]  oxyCODONE-acetaminophen (PERCOCET/ROXICET) 5-325 MG tablet Take 1 tablet by mouth every 6 (six) hours as needed for severe pain. 09/07/21  Yes Jerald Kief, MD  pramipexole (MIRAPEX) 0.125 MG tablet Take 0.125 mg by mouth 4 (four) times daily.   Yes [provider]  pravastatin (PRAVACHOL) 40 MG tablet Take 40 mg by mouth daily.   Yes [provider]  QUEtiapine (SEROQUEL) 50 MG tablet Take 50 mg by mouth at bedtime. 02/03/22  Yes [provider]  sertraline (ZOLOFT) 25 MG tablet Take 1 tablet (25 mg total) by mouth daily. 09/08/21  Yes Jerald Kief,  MD  torsemide (DEMADEX) 20 MG tablet Take 40 mg by mouth daily as needed (fluid). 10/06/21  Yes [provider]  furosemide (LASIX) 40 MG tablet Take 1 tablet (40 mg total) by mouth daily. Patient not taking: Reported on 02/12/2022 09/07/21 02/12/22  Jerald Kief, MD  pioglitazone (ACTOS) 30 MG tablet Take 30 mg by mouth daily. Patient not taking: Reported on 01/07/2022 11/10/20   [provider]  QUEtiapine (SEROQUEL) 25 MG tablet Take 1 tablet (25 mg total) by mouth at bedtime. Patient not taking: Reported on 02/12/2022 09/07/21   Jerald Kief, MD  glimepiride (AMARYL) 4 MG tablet Take 4 mg by mouth 2 (two) times daily. Pt. Takes 1 tablet in the morning and .5 tablet at night.  12/05/20  [provider]    Physical Exam: Vitals:   02/12/22 0556 02/12/22 0603 02/12/22 0604 02/12/22 0615  BP:  (!) 155/73  127/85  Pulse:  89  80  Resp:   19  (!) 22  Temp: 99.1 F (37.3 C)     TempSrc: Oral     SpO2:  94%  90%  Weight:   68.3 kg   Height:   5' (1.524 m)    Exam  Constitutional: Elderly female who appears restless. Eyes: PERRL, lids and conjunctivae normal ENMT: Mucous membranes are dry. Posterior pharynx clear of any exudate or lesions.  Neck: normal, supple   Respiratory: clear to auscultation bilaterally, no wheezing, no crackles. Normal respiratory effort. No accessory muscle use.  Cardiovascular: Irregular and tachycardic.   Abdomen: no tenderness, no masses palpated. No hepatosplenomegaly. Bowel sounds positive.  Musculoskeletal: no clubbing / cyanosis.  Patient complains of having cramping in her leg Skin: Venous stasis changes of the lower extremities with bruising right leg Neurologic: CN 2-12 grossly intact.  Strength appears to be 5/5 in right upper and lower extremity.  Strength 4/5 on the left upper and lower extremity.  Slurred speech. Psychiatric: Normal judgment and insight. Alert and oriented x 3.  Anxious mood.   Data Reviewed:  EKG reveals sinus rhythm 86 bpm with multiple PVCs.  Assessment and Plan: Dysphagia and left-sided weakness  history of CVA Patient noted to be slurring speech with reports of left-sided weakness.  At baseline patient has a prior history of issues with dysphagia per review of records but daughter noted that she was on a regular diet at home.  CTA of the head and neck concerning for age-indeterminate left basal ganglia lacunar infarct which was new from prior CT imaging.  Neurology hadbeen formally consulted and recommended MRI of the brain as well as cervical spine.  Hemoglobin A1c 6.3 and LDL at goal at 38. -Admit to telemetry bed -Stroke order set initiated -Neuro checks -N.p.o. -Check  MRI brain and cervical spine per neurology recommendation -Check echocardiogram -PT/OT/Speech to evaluate and treat -Follow-up telemetry overnight -ASA   -Social work consult   -Appreciate neurology consultative services, will follow-up for any further recommendation -Anesthesiology consulted to help obtain MRI due to patient inability to sit still and claustrophobia  Community-acquired pneumonia Acute.  Patient with prior history of dysphagia and suspected aspiration pneumonia in the past.  CTA of the head and neck concerning for left upper lobe pneumonia, and chest x-ray concerning for possibility of interstitial edema versus pneumonia.  Question possibility of aspiration given patient's reports of dysphagia. -Aspiration precautions with elevation of head of bed -Add on procalcitonin -Continue Unasyn and doxycycline  SIRS Acute.  Patient was noted to  be tachypneic with WBC 13.9 meeting SIRS criteria.  No initial lactic acid or blood cultures have been obtained.  Imaging gave concern for possibility of pneumonia and/or cellulitis.  ED provider ordered blood cultures and lactic acid. -Follow-up blood cultures and lactic acid -Antibiotics as noted above  Heart failure with reduced EF Acute on chronic.  Patient reports having difficulty breathing especially when laying flat.  BNP was elevated at 523.4 chest x-ray gave concern for acute interstitial edema superimposed on hilar lung scarring and atelectasis versus possibility of acute viral/atypical infection.  Last echocardiogram revealed EF of 25-30% with normal diastolic parameters in 07/2021. -Strict I&Os and daily weights -Follow-up echocardiogram -Furosemide 40 mg IV twice daily -Reassess diuresis in a.m.  Essential hypertension Home medication regimen includes losartan 100 mg daily, metoprolol succinate 50 mg daily, and torsemide as needed for fluid.  Patient noted to be out of the window for permissive hypertension at this time. -Continue losartan and metoprolol when able -Changed metoprolol to 2.5 mg IV every 8 hours while patient unable to take p.o. meds  Parkinson's disease Home regimen includes Sinemet  2.5 25-100 mg tablets 4 times daily, Sinemet 50-200 mg controlled release tablet nightly, entacapone 100 mg 3 times daily, and Seroquel.  Daughter notes that she has developed hallucinations thought to be related to entacapone, but were not limited at what medications to switch to. -Delirium precautions -Continue current home regimen once able -Ativan IV as needed for anxiety  Diabetes mellitus type 2, controlled Hemoglobin A1c 6.3 which appears to be well controlled.  Home medication regimen includes metformin 1000 mg twice daily with meals. -Hypoglycemic protocols -Hold metformin -CBGs before every meal with sensitive SSI  Hypothyroidism Home medication regimen includes levothyroxine 75 mcg daily. -Check TSH -Continue levothyroxine  Hyperlipidemia -Continue pravastatin -Adjust medications as needed for goal LDL less than 70  Chronic back pain Patient is on chronic opioids and oxycodone,. -Continue oxycodone and gabapentin once able -Fentanyl IV as needed for pain   Advance Care Planning:   Code Status: Full Code   Consults: Neurology  Family Communication: Daughter updated over the phone  Severity of Illness: The appropriate patient status for this patient is INPATIENT. Inpatient status is judged to be reasonable and necessary in order to provide the required intensity of service to ensure the patient's safety. The patient's presenting symptoms, physical exam findings, and initial radiographic and laboratory data in the context of their chronic comorbidities is felt to place them at high risk for further clinical deterioration. Furthermore, it is not anticipated that the patient will be medically stable for discharge from the hospital within 2 midnights of admission.   * I certify that at the point of admission it is my clinical judgment that the patient will require inpatient hospital care spanning beyond 2 midnights from the point of admission due to high intensity of service,  high risk for further deterioration and high frequency of surveillance required.*  Author: Clydie Braun, MD 02/12/2022 10:03 AM  For on call review [email or web address].

## 2022-02-12 NOTE — ED Triage Notes (Signed)
Patient called PD herself d/t that she could not walk like normal. PD had to force entry into house called EMS d/t slurred speech and weakness as noticed by daughter ?

## 2022-02-12 NOTE — Progress Notes (Signed)
SLP Cancellation Note ? ?Patient Details ?Name: Stephanie [name] ?MRN: [id number] ?DOB: [birth year] ? ? ?Cancelled treatment:       Reason Eval/Treat Not Completed: Fatigue/lethargy limiting ability to participate; nursing stated pt failed Yalex3; spoke with pt briefly who was extremely lethargic and she stated "the flap in my throat doesn't work, but I eat and drink whatever I want at home."  ST will see next date for BSE when pt's alertness level improves.   ? ? ?Elvina Sidle, M.S., CCC-SLP ?02/12/2022, 4:05 PM ?

## 2022-02-12 NOTE — Plan of Care (Signed)
The patient is admitted from ER to [address]. A & O x 4. No acute distress noted. Patient is oriented to call bell/ascom and staff. Full assessment to epic completed. Will continue to monitor. ?

## 2022-02-13 ENCOUNTER — Inpatient Hospital Stay (HOSPITAL_COMMUNITY): Payer: Medicare Other

## 2022-02-13 DIAGNOSIS — R051 Acute cough: Secondary | ICD-10-CM | POA: Diagnosis not present

## 2022-02-13 DIAGNOSIS — J189 Pneumonia, unspecified organism: Secondary | ICD-10-CM | POA: Diagnosis not present

## 2022-02-13 DIAGNOSIS — R4781 Slurred speech: Secondary | ICD-10-CM | POA: Diagnosis not present

## 2022-02-13 DIAGNOSIS — I5020 Unspecified systolic (congestive) heart failure: Secondary | ICD-10-CM | POA: Diagnosis not present

## 2022-02-13 DIAGNOSIS — I6381 Other cerebral infarction due to occlusion or stenosis of small artery: Secondary | ICD-10-CM

## 2022-02-13 LAB — COMPREHENSIVE METABOLIC PANEL
ALT: 8 U/L (ref 0–44)
AST: 26 U/L (ref 15–41)
Albumin: 2.6 g/dL — ABNORMAL LOW (ref 3.5–5.0)
Alkaline Phosphatase: 75 U/L (ref 38–126)
Anion gap: 10 (ref 5–15)
BUN: 19 mg/dL (ref 8–23)
CO2: 22 mmol/L (ref 22–32)
Calcium: 8.3 mg/dL — ABNORMAL LOW (ref 8.9–10.3)
Chloride: 109 mmol/L (ref 98–111)
Creatinine, Ser: 0.8 mg/dL (ref 0.44–1.00)
GFR, Estimated: >60 mL/min (ref >60)
Glucose, Bld: 85 mg/dL (ref 70–99)
Potassium: 4.4 mmol/L (ref 3.5–5.1)
Sodium: 141 mmol/L (ref 135–145)
Total Bilirubin: 1.8 mg/dL — ABNORMAL HIGH (ref 0.3–1.2)
Total Protein: 5.9 g/dL — ABNORMAL LOW (ref 6.5–8.1)

## 2022-02-13 LAB — CBC
HCT: 35 % — ABNORMAL LOW (ref 36.0–46.0)
Hemoglobin: 11.6 g/dL — ABNORMAL LOW (ref 12.0–15.0)
MCH: 30.2 pg (ref 26.0–34.0)
MCHC: 33.1 g/dL (ref 30.0–36.0)
MCV: 91.1 fL (ref 80.0–100.0)
Platelets: 251 10*3/uL (ref 150–400)
RBC: 3.84 MIL/uL — ABNORMAL LOW (ref 3.87–5.11)
RDW: 16.2 % — ABNORMAL HIGH (ref 11.5–15.5)
WBC: 12.4 10*3/uL — ABNORMAL HIGH (ref 4.0–10.5)
nRBC: 0 % (ref 0.0–0.2)

## 2022-02-13 LAB — GLUCOSE, CAPILLARY
Glucose-Capillary: 87 mg/dL (ref 70–99)
Glucose-Capillary: 107 mg/dL — ABNORMAL HIGH (ref 70–99)
Glucose-Capillary: 156 mg/dL — ABNORMAL HIGH (ref 70–99)
Glucose-Capillary: 186 mg/dL — ABNORMAL HIGH (ref 70–99)

## 2022-02-13 LAB — BRAIN NATRIURETIC PEPTIDE: B Natriuretic Peptide: 1019 pg/mL — ABNORMAL HIGH (ref 0.0–100.0)

## 2022-02-13 LAB — MRSA NEXT GEN BY PCR, NASAL: MRSA by PCR Next Gen: NOT DETECTED

## 2022-02-13 LAB — MAGNESIUM: Magnesium: 1.4 mg/dL — ABNORMAL LOW (ref 1.7–2.4)

## 2022-02-13 MED ORDER — ALLOPURINOL 100 MG PO TABS
100.0000 mg | ORAL_TABLET | Freq: Every day | ORAL | Status: DC
  Administered 2022-02-13 – 2022-02-16 (×4): 100 mg via ORAL
  Filled 2022-02-13 (×4): qty 1

## 2022-02-13 MED ORDER — MAGNESIUM SULFATE 2 GM/50ML IV SOLN
2.0000 g | Freq: Once | INTRAVENOUS | Status: AC
  Administered 2022-02-13: 2 g via INTRAVENOUS
  Filled 2022-02-13: qty 50

## 2022-02-13 MED ORDER — IOHEXOL 300 MG/ML  SOLN
100.0000 mL | Freq: Once | INTRAMUSCULAR | Status: AC | PRN
  Administered 2022-02-13: 100 mL via INTRAVENOUS

## 2022-02-13 MED ORDER — HEPARIN SODIUM (PORCINE) 5000 UNIT/ML IJ SOLN
5000.0000 [IU] | Freq: Three times a day (TID) | INTRAMUSCULAR | Status: DC
  Administered 2022-02-13 – 2022-02-16 (×9): 5000 [IU] via SUBCUTANEOUS
  Filled 2022-02-13 (×9): qty 1

## 2022-02-13 MED ORDER — CARVEDILOL 3.125 MG PO TABS
3.1250 mg | ORAL_TABLET | Freq: Two times a day (BID) | ORAL | Status: DC
  Administered 2022-02-13 – 2022-02-16 (×4): 3.125 mg via ORAL
  Filled 2022-02-13 (×6): qty 1

## 2022-02-13 NOTE — Progress Notes (Signed)
?                                  PROGRESS NOTE                                             ?                                                                                                                     ?                                         ? ? Patient Demographics:  ? ? Stephanie [name], is a 76 y.o. female, DOB - 1946-10-03, UXL:[id number] ? ?Outpatient Primary MD for the patient is [name], MD    LOS - 1  Admit date - 02/12/2022   ? ?Chief Complaint  ?Patient presents with  ? Stroke Symptoms  ?  BIBA from home. Lives with daughter. Pt c/o being unable to ambulate at home, L sided facial droop, slurred speech -per daughter.LKW '@2130'$   ?    ? ?Brief Narrative (HPI from H&P) - 76 y.o. female with medical history significant of hypertension, hyperlipidemia, HFrEF EF 25-30%, diabetes mellitus type 2, Parkinson disease, history of dysphagia, anxiety, hypothyroidism, chronic back pain, sleep apnea on 2 L of nasal cannula oxygen at night who presented due to worsening weakness, slurred speech, and confusion.  In the ER CT scan of the head was nonacute neurology was consulted and she was admitted for stroke work-up long with possible URI versus pneumonia. ? ? Subjective:  ? ? [name] today has, No headache, No chest pain, some LLQ abdominal pain - No Nausea, No new weakness tingling or numbness, no SOB. ? ? Assessment  & Plan :  ? ?Dysphagia and left-sided weakness  history of CVA  - Patient noted to be slurring speech with reports of left-sided weakness at home, at baseline due to her Parkinson's she does have some dysphagia, CT brain and CTA brain were nonacute but did show left age indeterminant basal ganglia lacunar infarct, neurology has seen the patient.  She is currently on aspirin full dose, A1c and LDL are at goal.  Echo nonacute.  MRI brain today.  PT OT and speech to evaluate.  Continue to monitor.  Neurology to guide further care for this  issue ? ?Community-acquired pneumonia - Acute.  Patient with prior history of dysphagia and suspected aspiration pneumonia in the past.  Speech therapy following, continue aspiration precautions and continue on the combination of Unasyn and doxycycline, monitor clinically.  No sepsis at the time of my evaluation. ? ?Questionable mild left lower quadrant pain and tenderness.  At risk  for diverticulitis, check CT abdomen pelvis.  Continue on antibiotics for #2 above. ? ?Chronic systolic heart failure last known EF was 30% now seems to have improved to 35%.  Currently appears fully compensated, continue ARB, Lasix, add low-dose Coreg if tolerated by blood pressure and monitor.  ? ?Essential hypertension  - continue home dose ARB and diuretic, Coreg added for better control will monitor and adjust. ?  ?Parkinson's disease - Home regimen includes Sinemet 2.5 25-100 mg tablets 4 times daily, Sinemet 50-200 mg controlled release tablet nightly, entacapone 100 mg 3 times daily, and Seroquel.  Continue current regimen, according to the daughter notes that she has developed hallucinations thought to be related to entacapone will monitor for any side effects here. ? ?Hypothyroidism - on levothyroxine 75 mcg daily.  TSH mildly elevated, could be sick euthyroid PCP to repeat TSH in 4 to 6 weeks ?  ?Hyperlipidemia  - Continue pravastatin, LDL was under 70. ?  ?Chronic back pain - Patient is on chronic opioids and oxycodone, continue with caution avoid overdose. ?  ?Diabetes mellitus type 2, controlled  Hemoglobin A1c 6.3 which appears to be well controlled.  SSI here ? ?CBG (last 3)  ?Recent Labs  ?  02/12/22 ?1628 02/12/22 ?2038 02/13/22 ?0845  ?GLUCAP 156* 96 87  ? ? ?   ? ?Condition - Extremely Guarded ? ?Family Communication  :  [name] [phone] 02/13/22  ? ?Code Status :  Full ? ?Consults  :  Neuro ? ?PUD Prophylaxis :  ? ? Procedures  :    ? ?MRI -  ? ?CT  A&P -  ? ?TTE - 1. Left ventricular ejection fraction, by  estimation, is 30 to 35%. The left ventricle has moderately decreased function. The left ventricle has no regional wall motion abnormalities. Left ventricular diastolic parameters are indeterminate.  2. Right ventricular systolic function is normal. The right ventricular size is normal.  3. Left atrial size was mildly dilated.  4. Right atrial size was mildly dilated.  5. The mitral valve is grossly normal. Mild mitral valve regurgitation.  6. Tricuspid valve regurgitation is mild to moderate.  7. The aortic valve is grossly normal. Aortic valve regurgitation is mild. ? ?CT & CTA -  1. Age indeterminate Left Basal Ganglia Lacunar Infarct appears new since last year. Otherwise stable CT appearance of chronic small vessel disease. 2. New left upper lobe lung opacity since last year, suspicious for Acute Pneumonia. 3. CTA is negative for large vessel occlusion. Positive for heavily calcified ICA siphons with moderate Left ICA siphon stenosis. No other Significant arterial stenosis in the head or neck. 4. Advanced chronic cervical spine degeneration ? ?   ? ?Disposition Plan  :   ? ?Status is: Inpatient ? ? ?DVT Prophylaxis  :  Heparin ? ?heparin injection 5,000 Units Start: 02/13/22 1400 ?Place and maintain sequential compression device Start: 02/12/22 1445 ? ?Lab Results  ?Component Value Date  ? PLT 251 02/13/2022  ? ? ?Diet :  ?Diet Order   ? ?       ?  DIET DYS 3 Room service appropriate? Yes; Fluid consistency: Thin  Diet effective now       ?  ? ?  ?  ? ?  ?  ? ?Inpatient Medications ? ?Scheduled Meds: ?  stroke: early stages of recovery book   Does not apply Once  ? allopurinol  100 mg Oral Daily  ? aspirin  325 mg Oral Daily  ?  carbidopa-levodopa  1 tablet Oral QHS  ? carbidopa-levodopa  2.5 tablet Oral QID  ? carvedilol  3.125 mg Oral BID WC  ? entacapone  100 mg Oral TID  ? furosemide  40 mg Intravenous BID  ? gabapentin  300 mg Oral QHS  ? heparin injection (subcutaneous)  5,000 Units Subcutaneous Q8H  ?  insulin aspart  0-9 Units Subcutaneous TID WC  ? levothyroxine  75 mcg Oral Daily  ? losartan  100 mg Oral Daily  ? pramipexole  0.125 mg Oral QID  ? pravastatin  40 mg Oral Daily  ? QUEtiapine  50 mg Oral QHS  ? sertraline  25 mg Oral Daily  ? ?Continuous Infusions: ? ampicillin-sulbactam (UNASYN) IV 3 g (02/13/22 3845)  ? doxycycline (VIBRAMYCIN) IV 100 mg (02/12/22 2349)  ? ?PRN Meds:.acetaminophen **OR** acetaminophen (TYLENOL) oral liquid 160 mg/5 mL **OR** acetaminophen, fluticasone, LORazepam, morphine injection ? ?Antibiotics  :   ? ?Anti-infectives (From admission, onward)  ? ? Start     Dose/Rate Route Frequency Ordered Stop  ? 02/12/22 0845  Ampicillin-Sulbactam (UNASYN) 3 g in sodium chloride 0.9 % 100 mL IVPB       ? 3 g ?200 mL/hr over 30 Minutes Intravenous Every 6 hours 02/12/22 0834    ? 02/12/22 0815  doxycycline (VIBRAMYCIN) 100 mg in sodium chloride 0.9 % 250 mL IVPB       ? 100 mg ?125 mL/hr over 120 Minutes Intravenous Every 12 hours 02/12/22 0804    ? ?  ? ? ? Time Spent in minutes  30 ? ? ?Lala Lund M.D on 02/13/2022 at 9:28 AM ? ?To page go to [email or web address]  ? ?[facility] -  Office  [phone] ? ?See all Orders from today for further details ? ? ? Objective:  ? ?Vitals:  ? 02/13/22 0616 02/13/22 0700 02/13/22 0711 02/13/22 0842  ?BP: 114/88  (!) 135/122 (!) 166/75  ?Pulse: 74 81 73 67  ?Resp: '14 17 14 14  '$ ?Temp: 98 ?F (36.7 ?C)   98.1 ?F (36.7 ?C)  ?TempSrc:    Oral  ?SpO2: (!) 85% 98% (!) 84% 98%  ?Weight:      ?Height:      ? ? ?Wt Readings from Last 3 Encounters:  ?02/12/22 66.1 kg  ?02/08/22 66.5 kg  ?01/07/22 68.5 kg  ? ? ? ?Intake/Output Summary (Last 24 hours) at 02/13/2022 0928 ?Last data filed at 02/13/2022 0855 ?Gross per 24 hour  ?Intake 699.27 ml  ?Output 300 ml  ?Net 399.27 ml  ? ? ? ?Physical Exam ? ?Awake Alert, minimal subjective left-sided weakness, ?[place]AT,PERRAL ?Supple Neck, No JVD,   ?Symmetrical Chest wall movement, Good air movement bilaterally, CTAB ?RRR,No  Gallops,Rubs or new Murmurs,  ?+ve B.Sounds, Abd Soft, questionable mild left lower quadrant tenderness ?No Cyanosis, Clubbing or edema  ?  ? ? Data Review:  ? ? ?CBC ?Recent Labs  ?Lab 02/12/22 ?0605 02/12/22 ?07

## 2022-02-13 NOTE — Progress Notes (Signed)
Brief Neuro Update: ? ?MRI brain and C spine reviewed and no acute stroke on MRI but notable for priro L basal ganglia hemorrhagic stroke which would not explain her left sided symptoms. MRI C spine with multilevel degenerative disc disease but no cord compression. ? ?Recs: ?- no further inpatient neurological workup recommended at this time. ?- Recommend outpatient neurology evaluation and potential outpaient workup with potential EMG/NCS. ? ?Stephanie [name] ?[facility] ?Pager Number [phone] ?

## 2022-02-13 NOTE — Progress Notes (Signed)
Physical Therapy Evaluation ?Patient Details ?Name: Stephanie [name] ?MRN: [id number] ?DOB: 10/19/1946 ?Today's Date: 02/13/2022 ? ?History of Present Illness ? Stephanie [name] is a 76 y.o. female admitted 4/14 presented due to worsening left sided weakness, slurred speech, and confusion.  PMH:  hypertension, hyperlipidemia, HFrEF EF 25-30%, diabetes mellitus type 2, Parkinson disease, history of dysphagia, anxiety, hypothyroidism, chronic back pain, sleep apnea on 2 L of nasal cannula oxygen at night  ?Clinical Impression ? Pt admitted with above diagnosis. Tried to contact daughter to figure out support at home and discuss d/c plan however no answer. Per pt she is left alone at times at home. IF pt is truly left alone and refuses SNF would need  to stay in wheelchair if daughter leaves her as she is unsafe to walk in home when alone due to poor safety awareness and decr balance.   Will continue to follow acutely.  Pt currently with functional limitations due to the deficits listed below (see PT Problem List). Pt will benefit from skilled PT to increase their independence and safety with mobility to allow discharge to the venue listed below.      ?   ? ?Recommendations for follow up therapy are one component of a multi-disciplinary discharge planning process, led by the attending physician.  Recommendations may be updated based on patient status, additional functional criteria and insurance authorization. ? ?Follow Up Recommendations Home health PT, pt refusing SNF and cannot get daughter on phone to confirm information ? ?  ?Assistance Recommended at Discharge Frequent or constant Supervision/Assistance  ?Patient can return home with the following ? A little help with walking and/or transfers;A little help with bathing/dressing/bathroom;Assistance with cooking/housework;Assist for transportation;Help with stairs or ramp for entrance ? ?  ?Equipment Recommendations Wheelchair (measurements PT);Wheelchair cushion  (measurements PT)  ?Recommendations for Other Services ?    ?  ?Functional Status Assessment Patient has had a recent decline in their functional status and demonstrates the ability to make significant improvements in function in a reasonable and predictable amount of time.  ? ?  ?Precautions / Restrictions Precautions ?Precautions: Fall ?Restrictions ?Weight Bearing Restrictions: No  ? ?  ? ?Mobility ? Bed Mobility ?Overal bed mobility: Needs Assistance ?Bed Mobility: Supine to Sit ?  ?  ?Supine to sit: Min assist ?  ?  ?General bed mobility comments: assist to complete roll and for elevation of trunk ?  ? ?Transfers ?Overall transfer level: Needs assistance ?Equipment used: Rolling walker (2 wheels) ?Transfers: Sit to/from Stand, Bed to chair/wheelchair/BSC ?Sit to Stand: Min assist, +2 safety/equipment ?  ?Step pivot transfers: Min assist, +2 safety/equipment ?  ?  ?  ?General transfer comment: Pt requires min assist to power up and with posterior lean at times which she does self correct statically but dynamically needs more steadying assist during pivot.  Pt with somewhat flexed posture as well. ?  ? ?Ambulation/Gait ?  ?  ?  ?  ?  ?  ?  ?  ? ?Stairs ?  ?  ?  ?  ?  ? ?Wheelchair Mobility ?  ? ?Modified Rankin (Stroke Patients Only) ?  ? ?  ? ?Balance Overall balance assessment: Needs assistance ?Sitting-balance support: No upper extremity supported, Feet supported ?Sitting balance-Leahy Scale: Poor ?Sitting balance - Comments: Needed min assist for some time in sitting statically ?Postural control: Posterior lean ?Standing balance support: Bilateral upper extremity supported, During functional activity, Reliant on assistive device for balance ?Standing balance-Leahy Scale: Poor ?Standing balance  comment: Needed UE support and external support wtih posterior lean and flexed posture. ?  ?  ?  ?  ?  ?  ?  ?  ?  ?  ?  ?   ? ? ? ?Pertinent Vitals/Pain Pain Assessment ?Pain Assessment: Faces ?Faces Pain Scale: Hurts  little more ?Pain Location: generalized ?Pain Descriptors / Indicators: Discomfort, Grimacing, Guarding ?Pain Intervention(s): Limited activity within patient's tolerance, Monitored during session, Repositioned  ? ? ?Home Living Family/patient expects to be discharged to:: Private residence ?Living Arrangements: Children ?Available Help at Discharge: Family;Available PRN/intermittently ?Type of Home: House ?Home Access: Stairs to enter ?  ?Entrance Stairs-Number of Steps: 2 ?  ?Home Layout: One level ?Home Equipment: Rollator (4 wheels);Cane - quad;Shower seat;BSC/3in1 (2LO2 at home) ?   ?  ?Prior Function Prior Level of Function : Independent/Modified Independent ?  ?  ?  ?  ?  ?  ?Mobility Comments: used rollator in the home ?ADLs Comments: sponge bathed for safety recently per pt ?  ? ? ?Hand Dominance  ? Dominant Hand: Right ? ?  ?Extremity/Trunk Assessment  ? Upper Extremity Assessment ?Upper Extremity Assessment: Defer to OT evaluation ?  ? ?Lower Extremity Assessment ?Lower Extremity Assessment: RLE deficits/detail;LLE deficits/detail ?RLE Deficits / Details: grossly 3/5 ?LLE Deficits / Details: grossly 3/5 ?  ? ?Cervical / Trunk Assessment ?Cervical / Trunk Assessment: Kyphotic  ?Communication  ? Communication: No difficulties  ?Cognition Arousal/Alertness: Awake/alert ?Behavior During Therapy: [facility] for tasks assessed/performed ?Overall Cognitive Status: Within Functional Limits for tasks assessed ?  ?  ?  ?  ?  ?  ?  ?  ?  ?  ?  ?  ?  ?  ?  ?  ?  ?  ?  ? ?  ?General Comments General comments (skin integrity, edema, etc.): Afib 68-100 bpm; 98% on 3LO2, 116/64 ? ?  ?Exercises General Exercises - Lower Extremity ?Ankle Circles/Pumps: AROM, Both, 10 reps, Supine ?Long Arc Quad: AROM, Both, 10 reps, Seated ?Heel Slides: AROM, Both, 10 reps, Seated  ? ?Assessment/Plan  ?  ?PT Assessment Patient needs continued PT services  ?PT Problem List Decreased activity tolerance;Decreased balance;Decreased mobility;Decreased  knowledge of use of DME;Decreased safety awareness;Decreased knowledge of precautions;Cardiopulmonary status limiting activity ? ?   ?  ?PT Treatment Interventions DME instruction;Gait training;Functional mobility training;Therapeutic activities;Therapeutic exercise;Balance training;Stair training;Patient/family education   ? ?PT Goals (Current goals can be found in the Care Plan section)  ?Acute Rehab PT Goals ?Patient Stated Goal: to go home ?PT Goal Formulation: With patient ?Time For Goal Achievement: 02/27/22 ?Potential to Achieve Goals: Good ? ?  ?Frequency Min 3X/week ?  ? ? ?Co-evaluation PT/OT/SLP Co-Evaluation/Treatment: Yes ?Reason for Co-Treatment: Complexity of the patient's impairments (multi-system involvement);For patient/therapist safety ?PT goals addressed during session: Mobility/safety with mobility ?  ?  ? ? ?  ?AM-PAC PT "6 Clicks" Mobility  ?Outcome Measure Help needed turning from your back to your side while in a flat bed without using bedrails?: A Little ?Help needed moving from lying on your back to sitting on the side of a flat bed without using bedrails?: A Little ?Help needed moving to and from a bed to a chair (including a wheelchair)?: A Little ?Help needed standing up from a chair using your arms (e.g., wheelchair or bedside chair)?: A Little ?Help needed to walk in hospital room?: A Lot ?Help needed climbing 3-5 steps with a railing? : A Lot ?6 Click Score: 16 ? ?  ?End of  Session Equipment Utilized During Treatment: Gait belt;Oxygen ?Activity Tolerance: Patient limited by fatigue ?Patient left: in chair;with call bell/phone within reach;with chair alarm set ?Nurse Communication: Mobility status ?PT Visit Diagnosis: Unsteadiness on feet (R26.81);Muscle weakness (generalized) (M62.81) ?  ? ?Time: 0037-9444 ?PT Time Calculation (min) (ACUTE ONLY): 33 min ? ? ?Charges:   PT Evaluation ?$PT Eval Moderate Complexity: 1 Mod ?  ?  ?   ? ? ?Lorella Gomez M,PT ?Acute Rehab  Services ?[phone] ?[phone] (pager)  ? ?[facility] F Linda Grimmer ?02/13/2022, 3:42 PM ? ?

## 2022-02-13 NOTE — Evaluation (Signed)
Clinical/Bedside Swallow Evaluation ?Patient Details  ?Name: Stephanie [name] ?MRN: [id number] ?Date of Birth: May 15, 1946 ? ?Today's Date: 02/13/2022 ?Time: SLP Start Time (ACUTE ONLY): [id number] SLP Stop Time (ACUTE ONLY): 9169 ?SLP Time Calculation (min) (ACUTE ONLY): 15 min ? ?Past Medical History:  ?Past Medical History:  ?Diagnosis Date  ? Anemia   ? Arthritis   ? CHF (congestive heart failure) ([place])   ? Chronic back pain   ? Diabetes mellitus without complication ([place])   ? Dysrhythmia   ? GERD (gastroesophageal reflux disease)   ? Hyperlipidemia   ? Hypertension   ? Hypothyroid   ? Multilevel degenerative disc disease   ? Parkinson's disease ([place])   ? Sleep apnea   ? Syncope   ? Tremors of nervous system   ? Wears dentures   ? full upper and lower  ? ?Past Surgical History:  ?Past Surgical History:  ?Procedure Laterality Date  ? ABDOMINAL HYSTERECTOMY    ? BACK SURGERY  09/16  ? CARDIAC CATHETERIZATION    ? CATARACT EXTRACTION W/PHACO Right 11/24/2015  ? Procedure: CATARACT EXTRACTION PHACO AND INTRAOCULAR LENS PLACEMENT (IOC);  Surgeon: Estill Cotta, MD;  Location: [facility];  Service: Ophthalmology;  Laterality: Right;  [place] 01:14 ?AP% 25.5 ?CDE 30.69 ?fluid pack lot # [id number]  ? CATARACT EXTRACTION W/PHACO Left 01/06/2021  ? Procedure: CATARACT EXTRACTION PHACO AND INTRAOCULAR LENS PLACEMENT (IOC) LEFT DIABETIC 6.98 00:40.1;  Surgeon: Birder Robson, MD;  Location: [facility];  Service: Ophthalmology;  Laterality: Left;  Diabetic - oral meds  ? COLONOSCOPY    ? COLONOSCOPY WITH PROPOFOL N/A 07/24/2019  ? Procedure: COLONOSCOPY WITH PROPOFOL;  Surgeon: Toledo, Benay Pike, MD;  Location: [facility];  Service: Gastroenterology;  Laterality: N/A;  ? ESOPHAGOGASTRODUODENOSCOPY N/A 07/24/2019  ? Procedure: ESOPHAGOGASTRODUODENOSCOPY (EGD);  Surgeon: Toledo, Benay Pike, MD;  Location: [facility];  Service: Gastroenterology;  Laterality: N/A;  ? OOPHORECTOMY    ? ?HPI:  ?Stephanie [name] is a 76 y.o.  female with medical history significant of hypertension, hyperlipidemia, HFrEF EF 25-30%, diabetes mellitus type 2, Parkinson disease, history of dysphagia, anxiety, hypothyroidism, chronic back pain, sleep apnea on 2 L of nasal cannula oxygen at night who presented due to worsening weakness, slurred speech, and confusion.  History is obtained from the patient, her daughter over the phone, and review of records.  Last  reported be normal around 9:30 PM last night.  Patient reports that she has been having worsening left-sided weakness  for several weeks to months of complaints of bilateral leg tingling.  Associated symptoms included reports of falls, back pain, nausea, vomiting, and changes in vision.  Patient reports that her speech is changed due to her mouth being so dry.  She was diagnosed with questionable stroke, CAP, and SIRS.  Most recent head CT was showing age indeterminate left basal ganglia infarct.  Chest xray was suspicious for acute on chronic lung disease, consider acute infection or less likely assymetric edema superimposed on a chronic ILD.  ?  ?Assessment / Plan / Recommendation  ?Clinical Impression ? Clinical swallowing evaluation was completed in setting for admission due to concern for stroke.  She was given thin liquids via spoon, cup and straw, pureed material and dual textured solids.  She normally wears full upper and lower dentures that were not available for intake.  Patient is known to [place] service from previous admission.  She had MBS completed on 07/23/2021 with recommendation for a dysphagia 3 diet with thin liquids.  Patient reported history of swallowing issues but that she's never been on thickened liquids.  Cranial nerve exam was completed and remarkable for slight droop on the left side of her lower face and decreased sensation on the entire left side of her face.  Lingual and jaw range of motion and strength were adequate.  There is concern for a possible pharyngeal dysphagia.   Throat clear was seen following serial sips of thin liquids via straw.  It was not seen given single sips of thin liquids.  Mastication and oral clearance of dual textured solids was adequate.  Suggest beginning a dysphagia 3 diet with thin liquids taking one single sip at a time. MBS will be completed to assess for any possible changes to her oral and pharungeal swallow given history of parkinson's disease and concern for PNA. ?SLP Visit Diagnosis: Dysphagia, unspecified (R13.10) ?   ?Aspiration Risk ? Mild aspiration risk  ?  ?Diet Recommendation   Dysphagia 3 with thin liquids pending MBS ? ?Medication Administration: Whole meds with puree  ?  ?Other  Recommendations Oral Care Recommendations: Oral care BID   ? ?Recommendations for follow up therapy are one component of a multi-disciplinary discharge planning process, led by the attending physician.  Recommendations may be updated based on patient status, additional functional criteria and insurance authorization. ? ?Follow up Recommendations Other (comment) (TBD)  ? ? ?  ?Assistance Recommended at Discharge  TBD  ?Functional Status Assessment Patient has had a recent decline in their functional status and demonstrates the ability to make significant improvements in function in a reasonable and predictable amount of time.  ?Frequency and Duration min 2x/week  ?2 weeks ?  ?   ? ?Swallow Study   ?General Date of Onset: 02/12/22 ?HPI: [name] is a 76 y.o. female with medical history significant of hypertension, hyperlipidemia, HFrEF EF 25-30%, diabetes mellitus type 2, Parkinson disease, history of dysphagia, anxiety, hypothyroidism, chronic back pain, sleep apnea on 2 L of nasal cannula oxygen at night who presented due to worsening weakness, slurred speech, and confusion.  History is obtained from the patient, her daughter over the phone, and review of records.  Last  reported be normal around 9:30 PM last night.  Patient reports that she has been  having worsening left-sided weakness  for several weeks to months of complaints of bilateral leg tingling.  Associated symptoms included reports of falls, back pain, nausea, vomiting, and changes in vision.  Patient reports that her speech is changed due to her mouth being so dry.  She was diagnosed with questionable stroke, CAP, and SIRS.  Most recent head CT was showing age indeterminate left basal ganglia infarct.  Chest xray was suspicious for acute on chronic lung disease, consider acute infection or less likely assymetric edema superimposed on a chronic ILD. ?Type of Study: Bedside Swallow Evaluation ?Previous Swallow Assessment: 07/23/2021 Rx dyspahgia 3/thin ?Diet Prior to this Study: NPO ?Temperature Spikes Noted: No ?Respiratory Status: Room air ?History of Recent Intubation: No ?Behavior/Cognition: Alert;Cooperative;Pleasant mood ?Oral Cavity Assessment: Dried secretions;Dry ?Oral Care Completed by SLP: No ?Oral Cavity - Dentition: Dentures, not available;Dentures, bottom;Dentures, top ?Vision: Functional for self-feeding ?Self-Feeding Abilities: Able to feed self ?Patient Positioning: Upright in bed ?Baseline Vocal Quality: Normal;Low vocal intensity ?Volitional Swallow: Able to elicit  ?  ?Oral/Motor/Sensory Function Overall Oral Motor/Sensory Function: Mild impairment ?Facial ROM: Reduced left ?Facial Symmetry: Abnormal symmetry left ?Facial Strength: Reduced left ?Facial Sensation: Reduced left ?Lingual ROM: Within Functional Limits ?Lingual Symmetry: Within  Functional Limits ?Lingual Strength: Within Functional Limits ?Mandible: Within Functional Limits   ?Ice Chips Ice chips: Not tested   ?Thin Liquid Thin Liquid: Within functional limits ?Presentation: Spoon;Cup;Straw  ?  ?Nectar Thick Nectar Thick Liquid: Not tested   ?Honey Thick Honey Thick Liquid: Not tested   ?Puree Puree: Within functional limits ?Presentation: Spoon   ?Solid ? ? ?  Solid: Within functional limits ?Presentation: Spoon  ? ?   ?Shelly Flatten, MA, CCC-SLP ?Acute Rehab SLP ?[phone]' ? ?Lamar Sprinkles ?02/13/2022,9:31 AM ? ? ? ?

## 2022-02-13 NOTE — Evaluation (Signed)
Occupational Therapy Evaluation ?Patient Details ?Name: Stephanie [name] ?MRN: [id number] ?DOB: 11/29/1945 ?Today's Date: 02/13/2022 ? ? ?History of Present Illness Stephanie [name] is a 76 y.o. female admitted 4/14 presented due to worsening left sided weakness, slurred speech, and confusion.  PMH:  hypertension, hyperlipidemia, HFrEF EF 25-30%, diabetes mellitus type 2, Parkinson disease, history of dysphagia, anxiety, hypothyroidism, chronic back pain, sleep apnea on 2 L of nasal cannula oxygen at night  ? ?Clinical Impression ?  ?Pt admitted for concerns listed above. PTA pt reported that she was relatively independent with all ADL's and IADL's, using a RW for ambulation. At this time, pt presents with increased weakness, balance deficits, cognitive concerns, and decreased activity tolerance. She is requiring min A at this time for all OOB mobility and ADL's. In standing, pt with posterior lean requiring support to correct. Recommending [facility] OT as pt is refusing SNF. PT attempted to contact daughter, with no answer. Pt informed that she will need to use WC in home when family is unable to provide supervision. OT will continue to follow acutely.   ?   ? ?Recommendations for follow up therapy are one component of a multi-disciplinary discharge planning process, led by the attending physician.  Recommendations may be updated based on patient status, additional functional criteria and insurance authorization.  ? ?Follow Up Recommendations ? Home health OT (SNF if family can't provide increased assist)  ?  ?Assistance Recommended at Discharge Frequent or constant Supervision/Assistance  ?Patient can return home with the following A little help with walking and/or transfers;A little help with bathing/dressing/bathroom;Assistance with cooking/housework;Direct supervision/assist for medications management;Direct supervision/assist for financial management ? ?  ?Functional Status Assessment ? Patient has had a recent  decline in their functional status and demonstrates the ability to make significant improvements in function in a reasonable and predictable amount of time.  ?Equipment Recommendations ? Wheelchair (measurements OT);Wheelchair cushion (measurements OT)  ?  ?Recommendations for Other Services   ? ? ?  ?Precautions / Restrictions Precautions ?Precautions: Fall ?Restrictions ?Weight Bearing Restrictions: No  ? ?  ? ?Mobility Bed Mobility ?Overal bed mobility: Needs Assistance ?Bed Mobility: Supine to Sit ?  ?  ?Supine to sit: Min assist ?  ?  ?General bed mobility comments: assist to complete roll and for elevation of trunk ?  ? ?Transfers ?Overall transfer level: Needs assistance ?Equipment used: Rolling walker (2 wheels) ?Transfers: Sit to/from Stand, Bed to chair/wheelchair/BSC ?Sit to Stand: Min assist, +2 safety/equipment ?  ?  ?Step pivot transfers: Min assist, +2 safety/equipment ?  ?  ?General transfer comment: Pt requires min assist to power up and with posterior lean at times which she does self correct statically but dynamically needs more steadying assist during pivot.  Pt with somewhat flexed posture as well. ?  ? ?  ?Balance Overall balance assessment: Needs assistance ?Sitting-balance support: No upper extremity supported, Feet supported ?Sitting balance-Leahy Scale: Poor ?Sitting balance - Comments: Needed min assist for some time in sitting statically ?Postural control: Posterior lean ?Standing balance support: Bilateral upper extremity supported, During functional activity, Reliant on assistive device for balance ?Standing balance-Leahy Scale: Poor ?Standing balance comment: Needed UE support and external support wtih posterior lean and flexed posture. ?  ?  ?  ?  ?  ?  ?  ?  ?  ?  ?  ?   ? ?ADL either performed or assessed with clinical judgement  ? ?ADL Overall ADL's : Needs assistance/impaired ?  ?  ?  ?  ?  ?  ?  ?  ?  ?  ?  ?  ?  ?  ?  ?  ?  ?  ?  ?  General ADL Comments: Pt requiring min-mod A  overalldue to balance and weakness.  ? ? ? ?Vision Baseline Vision/History: 1 Wears glasses ?Ability to See in Adequate Light: 0 Adequate ?Patient Visual Report: No change from baseline ?Vision Assessment?: No apparent visual deficits  ?   ?Perception   ?  ?Praxis   ?  ? ?Pertinent Vitals/Pain Pain Assessment ?Pain Assessment: Faces ?Faces Pain Scale: Hurts little more ?Pain Location: generalized ?Pain Descriptors / Indicators: Discomfort, Grimacing, Guarding ?Pain Intervention(s): Limited activity within patient's tolerance, Monitored during session, Repositioned  ? ? ? ?Hand Dominance Right ?  ?Extremity/Trunk Assessment Upper Extremity Assessment ?Upper Extremity Assessment: Generalized weakness ?  ?Lower Extremity Assessment ?Lower Extremity Assessment: Defer to PT evaluation ?RLE Deficits / Details: grossly 3/5 ?LLE Deficits / Details: grossly 3/5 ?  ?Cervical / Trunk Assessment ?Cervical / Trunk Assessment: Kyphotic ?  ?Communication Communication ?Communication: No difficulties ?  ?Cognition Arousal/Alertness: Awake/alert ?Behavior During Therapy: [facility] for tasks assessed/performed ?Overall Cognitive Status: Within Functional Limits for tasks assessed ?  ?  ?  ?  ?  ?  ?  ?  ?  ?  ?  ?  ?  ?  ?  ?  ?  ?  ?  ?General Comments  Afib 68-100 bpm; 98% on 3LO2, 116/64 ? ?  ?Exercises   ?  ?Shoulder Instructions    ? ? ?Home Living Family/patient expects to be discharged to:: Private residence ?Living Arrangements: Children ?Available Help at Discharge: Family;Available PRN/intermittently ?Type of Home: House ?Home Access: Stairs to enter ?Entrance Stairs-Number of Steps: 2 ?  ?Home Layout: One level ?  ?  ?Bathroom Shower/Tub: Tub/shower unit;Sponge bathes at baseline ?  ?Bathroom Toilet: Standard ?  ?  ?Home Equipment: Rollator (4 wheels);Cane - quad;Shower seat;BSC/3in1 (2LO2 at home) ?  ?  ?  ? ?  ?Prior Functioning/Environment Prior Level of Function : Independent/Modified Independent ?  ?  ?  ?  ?  ?  ?Mobility  Comments: used rollator in the home ?ADLs Comments: sponge bathed for safety recently per pt ?  ? ?  ?  ?OT Problem List: Decreased activity tolerance;Decreased strength;Impaired balance (sitting and/or standing);Decreased cognition;Decreased safety awareness;Decreased knowledge of use of DME or AE ?  ?   ?OT Treatment/Interventions: Self-care/ADL training;Therapeutic exercise;Energy conservation;DME and/or AE instruction;Therapeutic activities;Patient/family education;Balance training  ?  ?OT Goals(Current goals can be found in the care plan section) Acute Rehab OT Goals ?Patient Stated Goal: To go home ?OT Goal Formulation: With patient ?Time For Goal Achievement: 02/27/22 ?Potential to Achieve Goals: Good ?ADL Goals ?Pt Will Perform Lower Body Bathing: with modified independence;sitting/lateral leans ?Pt Will Perform Lower Body Dressing: with modified independence;sitting/lateral leans ?Pt Will Transfer to Toilet: with modified independence;ambulating ?Pt Will Perform Toileting - Clothing Manipulation and hygiene: with modified independence;sitting/lateral leans  ?OT Frequency: Min 2X/week ?  ? ?Co-evaluation PT/OT/SLP Co-Evaluation/Treatment: Yes ?Reason for Co-Treatment: Complexity of the patient's impairments (multi-system involvement);For patient/therapist safety ?PT goals addressed during session: Mobility/safety with mobility ?OT goals addressed during session: Strengthening/ROM;ADL's and self-care ?  ? ?  ?AM-PAC OT "6 Clicks" Daily Activity     ?Outcome Measure Help from another person eating meals?: A Little ?Help from another person taking care of personal grooming?: A Little ?Help from another person toileting, which includes using toliet, bedpan, or urinal?: A Little ?Help from another person bathing (including washing, rinsing, drying)?: A Lot ?Help from another person to put on and taking off regular upper body clothing?: A Little ?Help  from another person to put on and taking off regular lower body  clothing?: A Lot ?6 Click Score: 16 ?  ?End of Session Equipment Utilized During Treatment: Gait belt;Rolling walker (2 wheels) ?Nurse Communication: Mobility status ? ?Activity Tolerance: Patient tolerate

## 2022-02-14 DIAGNOSIS — E1169 Type 2 diabetes mellitus with other specified complication: Secondary | ICD-10-CM | POA: Diagnosis not present

## 2022-02-14 DIAGNOSIS — J189 Pneumonia, unspecified organism: Secondary | ICD-10-CM | POA: Diagnosis not present

## 2022-02-14 DIAGNOSIS — R051 Acute cough: Secondary | ICD-10-CM | POA: Diagnosis not present

## 2022-02-14 DIAGNOSIS — M545 Low back pain, unspecified: Secondary | ICD-10-CM | POA: Diagnosis not present

## 2022-02-14 LAB — COMPREHENSIVE METABOLIC PANEL
ALT: <5 U/L (ref 0–44)
AST: 16 U/L (ref 15–41)
Albumin: 2.3 g/dL — ABNORMAL LOW (ref 3.5–5.0)
Alkaline Phosphatase: 63 U/L (ref 38–126)
Anion gap: 6 (ref 5–15)
BUN: 18 mg/dL (ref 8–23)
CO2: 30 mmol/L (ref 22–32)
Calcium: 7.9 mg/dL — ABNORMAL LOW (ref 8.9–10.3)
Chloride: 104 mmol/L (ref 98–111)
Creatinine, Ser: 0.79 mg/dL (ref 0.44–1.00)
GFR, Estimated: >60 mL/min (ref >60)
Glucose, Bld: 110 mg/dL — ABNORMAL HIGH (ref 70–99)
Potassium: 2.9 mmol/L — ABNORMAL LOW (ref 3.5–5.1)
Sodium: 140 mmol/L (ref 135–145)
Total Bilirubin: 1 mg/dL (ref 0.3–1.2)
Total Protein: 5.5 g/dL — ABNORMAL LOW (ref 6.5–8.1)

## 2022-02-14 LAB — BRAIN NATRIURETIC PEPTIDE: B Natriuretic Peptide: 802.9 pg/mL — ABNORMAL HIGH (ref 0.0–100.0)

## 2022-02-14 LAB — CBC WITH DIFFERENTIAL/PLATELET
Abs Immature Granulocytes: 0.03 10*3/uL (ref 0.00–0.07)
Basophils Absolute: 0 10*3/uL (ref 0.0–0.1)
Basophils Relative: 1 %
Eosinophils Absolute: 0.2 10*3/uL (ref 0.0–0.5)
Eosinophils Relative: 3 %
HCT: 31 % — ABNORMAL LOW (ref 36.0–46.0)
Hemoglobin: 9.9 g/dL — ABNORMAL LOW (ref 12.0–15.0)
Immature Granulocytes: 0 %
Lymphocytes Relative: 12 %
Lymphs Abs: 1 10*3/uL (ref 0.7–4.0)
MCH: 29.3 pg (ref 26.0–34.0)
MCHC: 31.9 g/dL (ref 30.0–36.0)
MCV: 91.7 fL (ref 80.0–100.0)
Monocytes Absolute: 0.6 10*3/uL (ref 0.1–1.0)
Monocytes Relative: 7 %
Neutro Abs: 6.5 10*3/uL (ref 1.7–7.7)
Neutrophils Relative %: 77 %
Platelets: 214 10*3/uL (ref 150–400)
RBC: 3.38 MIL/uL — ABNORMAL LOW (ref 3.87–5.11)
RDW: 15.9 % — ABNORMAL HIGH (ref 11.5–15.5)
WBC: 8.4 10*3/uL (ref 4.0–10.5)
nRBC: 0 % (ref 0.0–0.2)

## 2022-02-14 LAB — GLUCOSE, CAPILLARY
Glucose-Capillary: 97 mg/dL (ref 70–99)
Glucose-Capillary: 104 mg/dL — ABNORMAL HIGH (ref 70–99)
Glucose-Capillary: 142 mg/dL — ABNORMAL HIGH (ref 70–99)
Glucose-Capillary: 163 mg/dL — ABNORMAL HIGH (ref 70–99)
Glucose-Capillary: 97 mg/dL (ref 70–99)

## 2022-02-14 LAB — MAGNESIUM: Magnesium: 1.7 mg/dL (ref 1.7–2.4)

## 2022-02-14 LAB — C-REACTIVE PROTEIN: CRP: 13.7 mg/dL — ABNORMAL HIGH (ref <1.0)

## 2022-02-14 MED ORDER — POTASSIUM CHLORIDE 10 MEQ/100ML IV SOLN
10.0000 meq | INTRAVENOUS | Status: AC
  Administered 2022-02-14 (×3): 10 meq via INTRAVENOUS
  Filled 2022-02-14 (×3): qty 100

## 2022-02-14 MED ORDER — MAGNESIUM SULFATE 2 GM/50ML IV SOLN
2.0000 g | Freq: Once | INTRAVENOUS | Status: AC
  Administered 2022-02-14: 2 g via INTRAVENOUS
  Filled 2022-02-14: qty 50

## 2022-02-14 MED ORDER — POTASSIUM CHLORIDE CRYS ER 20 MEQ PO TBCR
40.0000 meq | EXTENDED_RELEASE_TABLET | Freq: Three times a day (TID) | ORAL | Status: AC
  Administered 2022-02-14 (×2): 40 meq via ORAL
  Filled 2022-02-14 (×2): qty 2

## 2022-02-14 NOTE — Progress Notes (Signed)
?                                  PROGRESS NOTE                                             ?                                                                                                                     ?                                         ? ? Patient Demographics:  ? ? Stephanie [name], is a 76 y.o. female, DOB - 31-Jul-1946, PJA:[id number] ? ?Outpatient Primary MD for the patient is [name], MD    LOS - 2  Admit date - 02/12/2022   ? ?Chief Complaint  ?Patient presents with  ? Stroke Symptoms  ?  BIBA from home. Lives with daughter. Pt c/o being unable to ambulate at home, L sided facial droop, slurred speech -per daughter.LKW '@2130'$   ?    ? ?Brief Narrative (HPI from H&P) - 76 y.o. female with medical history significant of hypertension, hyperlipidemia, HFrEF EF 25-30%, diabetes mellitus type 2, Parkinson disease, history of dysphagia, anxiety, hypothyroidism, chronic back pain, sleep apnea on 2 L of nasal cannula oxygen at night who presented due to worsening weakness, slurred speech, and confusion.  In the ER CT scan of the head was nonacute neurology was consulted and she was admitted for stroke work-up long with possible URI versus pneumonia. ? ? Subjective:  ? ?Patient in bed, appears comfortable, denies any headache, no fever, no chest pain or pressure, no shortness of breath , no abdominal pain. No new focal weakness.  Chronic lower extremity pain. ? ? ? Assessment  & Plan :  ? ?Dysphagia and left-sided weakness  history of CVA  - Patient noted to be slurring speech with reports of left-sided weakness at home, at baseline due to her Parkinson's she does have some dysphagia, CT brain and CTA brain were nonacute but did show left age indeterminant basal ganglia lacunar infarct, neurology has seen the patient.  She is currently on aspirin full dose, A1c and LDL are at goal.  Echo nonacute.  MRI brain non acute.  PT OT and speech to  evaluate.  Continue to monitor.  Neurology to guide further care for this issue ? ?Community-acquired pneumonia - Acute.  Patient with prior history of dysphagia and suspected aspiration pneumonia in the past.  Speech therapy following, continue aspiration precautions and continue on the combination of Unasyn and doxycycline, monitor clinically.  No sepsis at the time of my evaluation. ? ?Questionable mild  left lower quadrant pain and tenderness.  At risk for diverticulitis, stable CT abdomen pelvis.  Continue on antibiotics for #2 above. ? ?Chronic systolic heart failure last known EF was 30% now seems to have improved to 35%.  Currently appears fully compensated, continue ARB, Lasix, add low-dose Coreg if tolerated by blood pressure and monitor.  ? ?Essential hypertension  - continue home dose ARB and diuretic, Coreg added for better control will monitor and adjust. ?  ?Parkinson's disease - Home regimen includes Sinemet 2.5 25-100 mg tablets 4 times daily, Sinemet 50-200 mg controlled release tablet nightly, entacapone 100 mg 3 times daily, and Seroquel.  Continue current regimen, according to the daughter notes that she has developed hallucinations thought to be related to entacapone will monitor for any side effects here. ? ?Hypothyroidism - on levothyroxine 75 mcg daily.  TSH mildly elevated, could be sick euthyroid PCP to repeat TSH in 4 to 6 weeks ?  ?Hyperlipidemia  - Continue pravastatin, LDL was under 70. ?  ?Chronic back pain - Patient is on chronic opioids and oxycodone, continue with caution avoid overdose. ?  ?Hypokalemia - replace IV + PO. ? ?Diabetes mellitus type 2, controlled  Hemoglobin A1c 6.3 which appears to be well controlled.  SSI here ? ?CBG (last 3)  ?Recent Labs  ?  02/13/22 ?2111 02/14/22 ?0825 02/14/22 ?2878  ?GLUCAP 186* 97 104*  ? ? ?   ? ?Condition - Extremely Guarded ? ?Family Communication  :  [name] [phone] 02/13/22, 02/14/22 ? ?Code Status :  Full ? ?Consults  :   Neuro ? ?PUD Prophylaxis :  ? ? Procedures  :    ? ?MRI -  ? ?CT  A&P -  ? ?TTE - 1. Left ventricular ejection fraction, by estimation, is 30 to 35%. The left ventricle has moderately decreased function. The left ventricle has no regional wall motion abnormalities. Left ventricular diastolic parameters are indeterminate.  2. Right ventricular systolic function is normal. The right ventricular size is normal.  3. Left atrial size was mildly dilated.  4. Right atrial size was mildly dilated.  5. The mitral valve is grossly normal. Mild mitral valve regurgitation.  6. Tricuspid valve regurgitation is mild to moderate.  7. The aortic valve is grossly normal. Aortic valve regurgitation is mild. ? ?CT & CTA -  1. Age indeterminate Left Basal Ganglia Lacunar Infarct appears new since last year. Otherwise stable CT appearance of chronic small vessel disease. 2. New left upper lobe lung opacity since last year, suspicious for Acute Pneumonia. 3. CTA is negative for large vessel occlusion. Positive for heavily calcified ICA siphons with moderate Left ICA siphon stenosis. No other Significant arterial stenosis in the head or neck. 4. Advanced chronic cervical spine degeneration ? ?   ? ?Disposition Plan  :   ? ?Status is: Inpatient ? ? ?DVT Prophylaxis  :  Heparin ? ?heparin injection 5,000 Units Start: 02/13/22 1400 ?Place and maintain sequential compression device Start: 02/12/22 1445 ? ?Lab Results  ?Component Value Date  ? PLT 214 02/14/2022  ? ? ?Diet :  ?Diet Order   ? ?       ?  DIET DYS 3 Room service appropriate? Yes; Fluid consistency: Thin  Diet effective now       ?  ? ?  ?  ? ?  ?  ? ?Inpatient Medications ? ?Scheduled Meds: ?  stroke: early stages of recovery book   Does not apply Once  ?  allopurinol  100 mg Oral Daily  ? aspirin  325 mg Oral Daily  ? carbidopa-levodopa  1 tablet Oral QHS  ? carbidopa-levodopa  2.5 tablet Oral QID  ? carvedilol  3.125 mg Oral BID WC  ? entacapone  100 mg Oral TID  ? furosemide   40 mg Intravenous BID  ? gabapentin  300 mg Oral QHS  ? heparin injection (subcutaneous)  5,000 Units Subcutaneous Q8H  ? insulin aspart  0-9 Units Subcutaneous TID WC  ? levothyroxine  75 mcg Oral Daily  ? losartan  100 mg Oral Daily  ? potassium chloride  40 mEq Oral Q8H  ? pramipexole  0.125 mg Oral QID  ? pravastatin  40 mg Oral Daily  ? QUEtiapine  50 mg Oral QHS  ? sertraline  25 mg Oral Daily  ? ?Continuous Infusions: ? ampicillin-sulbactam (UNASYN) IV 3 g (02/14/22 6803)  ? doxycycline (VIBRAMYCIN) IV 100 mg (02/14/22 1033)  ? ?PRN Meds:.acetaminophen **OR** acetaminophen (TYLENOL) oral liquid 160 mg/5 mL **OR** acetaminophen, fluticasone, LORazepam, morphine injection ? ?Antibiotics  :   ? ?Anti-infectives (From admission, onward)  ? ? Start     Dose/Rate Route Frequency Ordered Stop  ? 02/12/22 0845  Ampicillin-Sulbactam (UNASYN) 3 g in sodium chloride 0.9 % 100 mL IVPB       ? 3 g ?200 mL/hr over 30 Minutes Intravenous Every 6 hours 02/12/22 0834    ? 02/12/22 0815  doxycycline (VIBRAMYCIN) 100 mg in sodium chloride 0.9 % 250 mL IVPB       ? 100 mg ?125 mL/hr over 120 Minutes Intravenous Every 12 hours 02/12/22 0804    ? ?  ? ? ? Time Spent in minutes  30 ? ? ?Lala Lund M.D on 02/14/2022 at 11:10 AM ? ?To page go to [email or web address]  ? ?[facility] -  Office  [phone] ? ?See all Orders from today for further details ? ? ? Objective:  ? ?Vitals:  ? 02/14/22 0311 02/14/22 0500 02/14/22 0853 02/14/22 0911  ?BP: (!) 110/57  (!) 153/75   ?Pulse: 60  74 63  ?Resp: 11  18   ?Temp: 97.9 ?F (36.6 ?C)  98 ?F (36.7 ?C)   ?TempSrc: Oral  Oral   ?SpO2: 93%  96%   ?Weight:  62 kg    ?Height:      ? ? ?Wt Readings from Last 3 Encounters:  ?02/14/22 62 kg  ?02/08/22 66.5 kg  ?01/07/22 68.5 kg  ? ? ? ?Intake/Output Summary (Last 24 hours) at 02/14/2022 1110 ?Last data filed at 02/14/2022 1027 ?Gross per 24 hour  ?Intake 382.22 ml  ?Output 3400 ml  ?Net -3017.78 ml  ? ? ? ?Physical Exam ? ?Awake Alert, No new F.N  deficits, Normal affect ?[place].AT,PERRAL ?Supple Neck, No JVD,   ?Symmetrical Chest wall movement, Good air movement bilaterally, CTAB ?RRR,No Gallops, Rubs or new Murmurs,  ?+ve B.Sounds, Abd Soft, No tender

## 2022-02-15 ENCOUNTER — Inpatient Hospital Stay (HOSPITAL_COMMUNITY): Payer: Medicare Other

## 2022-02-15 DIAGNOSIS — I5020 Unspecified systolic (congestive) heart failure: Secondary | ICD-10-CM | POA: Diagnosis not present

## 2022-02-15 DIAGNOSIS — M545 Low back pain, unspecified: Secondary | ICD-10-CM | POA: Diagnosis not present

## 2022-02-15 DIAGNOSIS — J189 Pneumonia, unspecified organism: Secondary | ICD-10-CM | POA: Diagnosis not present

## 2022-02-15 DIAGNOSIS — R1310 Dysphagia, unspecified: Secondary | ICD-10-CM | POA: Diagnosis not present

## 2022-02-15 LAB — CBC WITH DIFFERENTIAL/PLATELET
Abs Immature Granulocytes: 0.02 10*3/uL (ref 0.00–0.07)
Basophils Absolute: 0.1 10*3/uL (ref 0.0–0.1)
Basophils Relative: 1 %
Eosinophils Absolute: 0.4 10*3/uL (ref 0.0–0.5)
Eosinophils Relative: 6 %
HCT: 34.4 % — ABNORMAL LOW (ref 36.0–46.0)
Hemoglobin: 10.9 g/dL — ABNORMAL LOW (ref 12.0–15.0)
Immature Granulocytes: 0 %
Lymphocytes Relative: 19 %
Lymphs Abs: 1.3 10*3/uL (ref 0.7–4.0)
MCH: 29.2 pg (ref 26.0–34.0)
MCHC: 31.7 g/dL (ref 30.0–36.0)
MCV: 92.2 fL (ref 80.0–100.0)
Monocytes Absolute: 0.7 10*3/uL (ref 0.1–1.0)
Monocytes Relative: 10 %
Neutro Abs: 4.4 10*3/uL (ref 1.7–7.7)
Neutrophils Relative %: 64 %
Platelets: 210 10*3/uL (ref 150–400)
RBC: 3.73 MIL/uL — ABNORMAL LOW (ref 3.87–5.11)
RDW: 15.9 % — ABNORMAL HIGH (ref 11.5–15.5)
WBC: 6.9 10*3/uL (ref 4.0–10.5)
nRBC: 0 % (ref 0.0–0.2)

## 2022-02-15 LAB — COMPREHENSIVE METABOLIC PANEL
ALT: 5 U/L (ref 0–44)
AST: 16 U/L (ref 15–41)
Albumin: 2.5 g/dL — ABNORMAL LOW (ref 3.5–5.0)
Alkaline Phosphatase: 60 U/L (ref 38–126)
Anion gap: 7 (ref 5–15)
BUN: 13 mg/dL (ref 8–23)
CO2: 28 mmol/L (ref 22–32)
Calcium: 8.2 mg/dL — ABNORMAL LOW (ref 8.9–10.3)
Chloride: 106 mmol/L (ref 98–111)
Creatinine, Ser: 0.74 mg/dL (ref 0.44–1.00)
GFR, Estimated: >60 mL/min (ref >60)
Glucose, Bld: 79 mg/dL (ref 70–99)
Potassium: 3.5 mmol/L (ref 3.5–5.1)
Sodium: 141 mmol/L (ref 135–145)
Total Bilirubin: 0.9 mg/dL (ref 0.3–1.2)
Total Protein: 5.8 g/dL — ABNORMAL LOW (ref 6.5–8.1)

## 2022-02-15 LAB — MAGNESIUM: Magnesium: 1.5 mg/dL — ABNORMAL LOW (ref 1.7–2.4)

## 2022-02-15 LAB — GLUCOSE, CAPILLARY
Glucose-Capillary: 111 mg/dL — ABNORMAL HIGH (ref 70–99)
Glucose-Capillary: 216 mg/dL — ABNORMAL HIGH (ref 70–99)
Glucose-Capillary: 110 mg/dL — ABNORMAL HIGH (ref 70–99)
Glucose-Capillary: 166 mg/dL — ABNORMAL HIGH (ref 70–99)

## 2022-02-15 LAB — C-REACTIVE PROTEIN: CRP: 9 mg/dL — ABNORMAL HIGH (ref <1.0)

## 2022-02-15 LAB — BRAIN NATRIURETIC PEPTIDE: B Natriuretic Peptide: 673.6 pg/mL — ABNORMAL HIGH (ref 0.0–100.0)

## 2022-02-15 MED ORDER — MAGNESIUM SULFATE 2 GM/50ML IV SOLN
2.0000 g | Freq: Once | INTRAVENOUS | Status: AC
  Administered 2022-02-15: 2 g via INTRAVENOUS
  Filled 2022-02-15: qty 50

## 2022-02-15 MED ORDER — OXYCODONE-ACETAMINOPHEN 5-325 MG PO TABS
1.0000 | ORAL_TABLET | Freq: Four times a day (QID) | ORAL | Status: DC | PRN
  Administered 2022-02-15 (×2): 1 via ORAL
  Filled 2022-02-15 (×2): qty 1

## 2022-02-15 MED ORDER — POTASSIUM CHLORIDE CRYS ER 20 MEQ PO TBCR
40.0000 meq | EXTENDED_RELEASE_TABLET | Freq: Once | ORAL | Status: AC
Start: 2022-02-15 — End: 2022-02-15
  Administered 2022-02-15: 40 meq via ORAL
  Filled 2022-02-15: qty 2

## 2022-02-15 NOTE — Progress Notes (Signed)
Physical Therapy Treatment ?Patient Details ?Name: Stephanie [name] ?MRN: [id number] ?DOB: 1946-04-15 ?Today's Date: 02/15/2022 ? ? ?History of Present Illness Stephanie [name] is a 76 y.o. female admitted 4/14 presented due to worsening left sided weakness, slurred speech, and confusion. Head CT, MRI-negative. Admitted with URI versus pneumonia.  PMH:  hypertension, hyperlipidemia, HFrEF EF 25-30%, diabetes mellitus type 2, Parkinson disease, history of dysphagia, anxiety, hypothyroidism, chronic back pain, sleep apnea on 2 L of nasal cannula oxygen at night ? ?  ?PT Comments  ? ? Patient progressing well towards PT goals. Requires Min A for transfers and gait training with use of RW for support. Noted to have slurred speech and some mild left sided weakness most notable during ambulation. Motivated to mobilize and walk with therapist. Sp02 dropped to 84% on RA when sitting on BSC but donned 4L/min 02 [place] for activity and able to maintain Sp02>90%.  Will continue to follow and progress as tolerated. ?  ?Recommendations for follow up therapy are one component of a multi-disciplinary discharge planning process, led by the attending physician.  Recommendations may be updated based on patient status, additional functional criteria and insurance authorization. ? ?Follow Up Recommendations ? Home health PT ?  ?  ?Assistance Recommended at Discharge Frequent or constant Supervision/Assistance  ?Patient can return home with the following A little help with walking and/or transfers;A little help with bathing/dressing/bathroom;Assistance with cooking/housework;Assist for transportation;Help with stairs or ramp for entrance ?  ?Equipment Recommendations ? Wheelchair (measurements PT);Wheelchair cushion (measurements PT)  ?  ?Recommendations for Other Services   ? ? ?  ?Precautions / Restrictions Precautions ?Precautions: Fall;Other (comment) ?Precaution Comments: watch 02 ?Restrictions ?Weight Bearing Restrictions: No  ?   ? ?Mobility ? Bed Mobility ?Overal bed mobility: Needs Assistance ?Bed Mobility: Supine to Sit ?  ?  ?Supine to sit: Min guard, HOB elevated ?  ?  ?General bed mobility comments: Use of rail but no physical assist needed. Increased time. ?  ? ?Transfers ?Overall transfer level: Needs assistance ?Equipment used: Rolling walker (2 wheels) ?Transfers: Sit to/from Stand, Bed to chair/wheelchair/BSC ?Sit to Stand: Min assist ?  ?Step pivot transfers: Min assist ?  ?  ?  ?General transfer comment: Min A to power to standing from EOB x1, from [facility] x1, transferred to chair post ambulation. Step pivot transfer to [facility] with Min A for balance/lines. Flexed posture. ?  ? ?Ambulation/Gait ?Ambulation/Gait assistance: Min assist ?Gait Distance (Feet): 25 Feet ?Assistive device: Rolling walker (2 wheels) ?Gait Pattern/deviations: Step-to pattern, Step-through pattern, Decreased stride length, Narrow base of support ?Gait velocity: decreased ?  ?  ?General Gait Details: Slow, mildly unsteady gait with short step lengths bilaterally and increased knee flexion LLE, difficulty with turns needing Min A for RW management and balance. Left knee instability noted. Sp02 dropped to 84% on RA, donned 4L/min 02 [place] and able to maintain Sp02 >90%. ? ? ?Stairs ?  ?  ?  ?  ?  ? ? ?Wheelchair Mobility ?  ? ?Modified Rankin (Stroke Patients Only) ?  ? ? ?  ?Balance Overall balance assessment: Needs assistance ?Sitting-balance support: Feet supported, No upper extremity supported ?Sitting balance-Leahy Scale: Fair ?  ?  ?Standing balance support: During functional activity ?Standing balance-Leahy Scale: Poor ?Standing balance comment: Needed UE support and external support, total A for pericare. ?  ?  ?  ?  ?  ?  ?  ?  ?  ?  ?  ?  ? ?  ?  Cognition Arousal/Alertness: Awake/alert ?Behavior During Therapy: [facility] for tasks assessed/performed ?Overall Cognitive Status: Within Functional Limits for tasks assessed ?  ?  ?  ?  ?  ?  ?  ?  ?  ?  ?  ?  ?  ?  ?  ?   ?  ?  ?  ? ?  ?Exercises   ? ?  ?General Comments General comments (skin integrity, edema, etc.): Sp02 dropped to 84% on RA sitting on BSC< donned 4L and able to maintain Sp02 >90% with activity. BP stable. ?  ?  ? ?Pertinent Vitals/Pain Pain Assessment ?Pain Assessment: Faces ?Faces Pain Scale: Hurts little more ?Pain Location: head ?Pain Descriptors / Indicators: Headache ?Pain Intervention(s): Monitored during session  ? ? ?Home Living   ?  ?  ?  ?  ?  ?  ?  ?  ?  ?   ?  ?Prior Function    ?  ?  ?   ? ?PT Goals (current goals can now be found in the care plan section) Progress towards PT goals: Progressing toward goals ? ?  ?Frequency ? ? ? Min 3X/week ? ? ? ?  ?PT Plan Current plan remains appropriate  ? ? ?Co-evaluation   ?  ?  ?  ?  ? ?  ?AM-PAC PT "6 Clicks" Mobility   ?Outcome Measure ? Help needed turning from your back to your side while in a flat bed without using bedrails?: A Little ?Help needed moving from lying on your back to sitting on the side of a flat bed without using bedrails?: A Little ?Help needed moving to and from a bed to a chair (including a wheelchair)?: A Little ?Help needed standing up from a chair using your arms (e.g., wheelchair or bedside chair)?: A Little ?Help needed to walk in hospital room?: A Little ?Help needed climbing 3-5 steps with a railing? : A Lot ?6 Click Score: 17 ? ?  ?End of Session Equipment Utilized During Treatment: Gait belt;Oxygen ?Activity Tolerance: Patient tolerated treatment well ?Patient left: in chair;with call bell/phone within reach;with chair alarm set ?Nurse Communication: Mobility status ?PT Visit Diagnosis: Unsteadiness on feet (R26.81);Muscle weakness (generalized) (M62.81) ?  ? ? ?Time: 1884-1660 ?PT Time Calculation (min) (ACUTE ONLY): 28 min ? ?Charges:  $Gait Training: 8-22 mins ?$Therapeutic Activity: 8-22 mins          ?          ? ?Marisa Severin, PT, DPT ?Acute Rehabilitation Services ?Secure chat preferred ?Office [phone] ? ? ? ? ? ?[place] ?02/15/2022, 1:14 PM ? ?

## 2022-02-15 NOTE — TOC Initial Note (Addendum)
Transition of Care (TOC) - Initial/Assessment Note  ? ? ?Patient Details  ?Name: Stephanie [name] ?MRN: [id number] ?Date of Birth: 1946/06/12 ? ?Transition of Care (TOC) CM/SW Contact:    ?Benard Halsted, LCSW ?Phone Number: ?02/15/2022, 2:19 PM ? ?Clinical Narrative:                 ?2pm-CSW received consult for possible SNF placement at time of discharge. CSW spoke with patient's daughter. Patient's daughter reported that she is currently unable to care for patient at their home given patient?s current physical needs and fall risk. Patient expressed understanding of PT recommendation but is agreeable to SNF placement at time of discharge. Patient reports preference for [facility]. CSW discussed insurance authorization process and will provide Medicare SNF ratings list. Patient has received COVID vaccines. CSW will send out referrals for review and emailed list to daughter at [email or web address]. No further questions reported at this time.  ? ?2:27pm-[facility] able to accept patient. CSW spoke with Butch Penny and she is accepting the bed offer for tomorrow. Patient will require non-emergency ambulance for transport.  ? ?Skilled Nursing Rehab Facilities-   [email or web address]   Ratings out of 5 possible   ?Name Address  Phone # Quality Care Staffing Health Inspection Overall  ?[facility] [address] [place] '4 5 2 3  '$ ?[facility]  [address], [place] [phone] '3 2 5 5  '$ ?[facility] '3 1 1 1  '$ ?[facility], [place] '3 2 4 4  '$ ?[facility] [address] [place] '1 1 2 1  '$ ?[facility] '2 1 4 3  '$ ?[facility] [address] [place] '5 2 3 4  '$ ?[facility] [address], [place] '5 2 2 3  '$ ?[facility] [place]  [phone] '5 1 2 2  '$ ?[facility] [address], [place] [phone] '4 1 2 1  '$ ?[facility] [address] [place] [phone] '4 1 2 1  '$ ?[facility] [place] [phone] '4 1 1 1  '$ ?[facility] [address] [place] [phone] '3 2 4 4  '$ ?        ?[facility] [address] [place]      ?[facility] '4 2 3 3  '$ ?[facility] [place] '4 1 5 4  '$ ?[place], [place] [address], [place] [phone] '2 1 1 1  '$ ?[facility] [address] [place] [phone] '2 1 3 2  '$ ?        ?[address] (no [facility]) [address], [place] [phone] '4 5 5 5  '$ ?[facility] (No Hum[employer]) [address], [place] '3 1 4 3  '$ ?[facility] (No UHC) [address], [place] [phone] '5 5 5 5  '$ ?[facility] [address], [place] [phone] '3 2 4 4  '$ ?[facility] [address] [place] '1 1 2 1  '$ ?[facility] [address], [place] [phone] '2 1 1 1  '$ ?[facility] [address] [place] [phone] '5 2 4 5  '$ ?[facility] [address] [place] '3 1 1 1  '$ ?[facility], [place] '2 1 2 1  '$ ?        ?  [place], [place] [phone] '1 1 1 1  '$ ?[facility] [address] [place]  [phone] '2 4 2 2  '$ ?[facility] [place] [address], [place] [phone] '5 2 3 4  '$ ?[facility] [address] [place] '2 1 1 1  '$ ?[place] (No Humana) [address], [place] '2 1 3 2  '$ ?[facility] [address] [place] [phone] '3 1 1 1  '$ ?        ?[facility] [place], [place] '5 4 5 5  '$ ?[facility])  [address], [place] '2 2 3 3  '$ ?[facility]  [facility]) [place], [place] '3 2 4 4  '$ ?[facility] [address], [place] '4 3 4 4  '$ ?[address] [place], [place] '3 3 1 1  '$ ?[facility]) [address] [place] [phone] '2 2 4 4  '$ ? ? ? ?Expected Discharge Plan: [place] ?Barriers to Discharge: Continued Medical Work up ? ? ?Patient Goals and CMS Choice ?Patient states their goals for this hospitalization and ongoing recovery are:: Rehab ?CMS Medicare.gov Compare Post Acute Care list provided to:: Patient ?Choice offered to / list presented to : Patient, Adult Children ? ?Expected Discharge Plan and Services ?Expected Discharge Plan: [place] ?In-house Referral: Clinical Social Work ?Discharge Planning Services: CM Consult ?Post Acute Care Choice: [place] ?Living arrangements for the past 2 months: [place] ?                ?  ?  ?  ?  ?  ?  ?  ?  ?  ?  ? ?Prior Living Arrangements/Services ?Living arrangements for the past 2 months: [place] ?Lives with:: Adult Children ?Patient language and need for interpreter reviewed:: Yes ?Do you feel safe going back to the place where you live?: Yes      ?Need for Family Participation in Patient Care: Yes (Comment) ?Care giver support system in place?: Yes (comment) ?Current home services: DME (Rollator (4 wheels);Cane - quad;Shower seat;BSC/3in1, 2LO2 at home at night) ?Criminal Activity/Legal Involvement Pertinent to Current Situation/Hospitalization: No - Comment as needed ? ?Activities of Daily Living ?Home Assistive Devices/Equipment: Blood pressure cuff, CBG Meter, Eyeglasses, Oxygen, Walker (specify type) ?ADL Screening (condition at time of admission) ?Patient's cognitive ability adequate to safely complete daily activities?: Yes ?Is the patient deaf or have difficulty hearing?: No ?Does the patient have difficulty seeing, even when wearing  glasses/contacts?: Yes ?Does the patient have difficulty concentrating, remembering, or making decisions?: No ?Patient able to express need for assistance with ADLs?: Yes ?Does the patient have difficulty dressing or bathing?: No ?Independently performs ADLs?: Yes (appropriate for developmental age) ?Does the patient have difficulty walking or climbing stairs?: Yes ?Weakness of Legs: Both ?Weakness of Arms/Hands: None ? ?Permission Sought/Granted ?Permission sought to share information with : Facility Sport and exercise psychologist, Family Supports ?Permission granted to share information with : Yes, Verbal Permission Granted ? Share Information with NAME: Butch Penny ? Permission granted to share info w AGENCY: SNFs ? Permission granted to share info w Relationship: Daughter ? Permission granted to share info w Contact Information: [phone] ? ?Emotional Assessment ?Appearance:: Appears stated age ?Attitude/Demeanor/Rapport: Engaged ?Affect (typically observed): Accepting, Appropriate ?Orientation: : Oriented to Self, Oriented to Place, Oriented to  Time, Oriented to Situation ?Alcohol / Substance Use: Not Applicable ?  Psych Involvement: No (comment) ? ?Admission diagnosis:  Slurred speech [R47.81] ?CVA (cerebral vascular accident) [facility]) [I63.9] ?Lacunar infarct, acute ([place]) [I63.81] ?Community acquired pneumonia, unspecified laterality [J18.9] ?Acute cough [R05.1] ?Patient Active Problem List  ? Diagnosis Date Noted  ? Dysphagia 02/12/2022  ? Chronic back pain 02/12/2022  ? HFrEF (heart failure with reduced ejection fraction) ([place]) 02/12/2022  ? SIRS (systemic inflammatory response syndrome) ([place]) 02/12/2022  ? CAP (community acquired pneumonia) 09/01/2021  ? Acute on chronic HFrEF (heart failure with reduced ejection fraction) ([place]) 08/31/2021  ? Hypertension associated with diabetes ([place]) 08/31/2021  ? Acute on chronic systolic (congestive) heart failure ([place]) 07/20/2021  ? SOB (shortness of breath) 07/20/2021  ? Acute  respiratory failure with hypoxia ([place]) 07/20/2021  ? Type 2 diabetes mellitus ([place])   ? Hypothyroid   ? Parkinson's disease ([place])   ? Hyperlipidemia associated with type 2 diabetes mellitus ([place])   ? Gout,

## 2022-02-15 NOTE — Progress Notes (Signed)
?                                  PROGRESS NOTE                                             ?                                                                                                                     ?                                         ? ? Patient Demographics:  ? ? Stephanie [name], is a 76 y.o. female, DOB - October 16, 1946, DTO:[id number] ? ?Outpatient Primary MD for the patient is Stephanie Crouch, MD    LOS - 3  Admit date - 02/12/2022   ? ?Chief Complaint  ?Patient presents with  ? Stroke Symptoms  ?  BIBA from home. Lives with daughter. Pt c/o being unable to ambulate at home, L sided facial droop, slurred speech -per daughter.LKW '@2130'$   ?    ? ?Brief Narrative (HPI from H&P) - 76 y.o. female with medical history significant of hypertension, hyperlipidemia, HFrEF EF 25-30%, diabetes mellitus type 2, Parkinson disease, history of dysphagia, anxiety, hypothyroidism, chronic back pain, sleep apnea on 2 L of nasal cannula oxygen at night who presented due to worsening weakness, slurred speech, and confusion.  In the ER CT scan of the head was nonacute neurology was consulted and she was admitted for stroke work-up long with possible URI versus pneumonia. ? ? Subjective:  ? ?Patient in bed, appears comfortable, denies any headache, no fever, no chest pain or pressure, no shortness of breath , no abdominal pain. No focal weakness.  Chronic lower back and lower extremity pain. ? ? ? Assessment  & Plan :  ? ?Dysphagia and left-sided weakness  history of CVA  - Patient noted to be slurring speech with reports of left-sided weakness at home, at baseline due to her Parkinson's she does have some dysphagia, CT brain and CTA brain were nonacute but did show left age indeterminant basal ganglia lacunar infarct, neurology has seen the patient.  She is currently on aspirin full dose, A1c and LDL are at goal.  Echo nonacute.  MRI brain non acute.  Seen by  neurology, PT, OT and speech.  Will require SNF. ? ? ?Community-acquired pneumonia - Acute.  Patient with prior history of dysphagia and suspected aspiration pneumonia in the past.  Speech therapy following, continue aspiration precautions and continue on the combination of Unasyn and doxycycline, monitor clinically.  No sepsis at the time of my evaluation. ? ?Questionable mild left lower quadrant pain and  tenderness.  At risk for diverticulitis, stable CT abdomen pelvis.  Continue on antibiotics for #2 above.  Will monitor bladder scans for possible bladder outlet obstruction on CT. ? ?Chronic systolic heart failure last known EF was 30% now seems to have improved to 35%.  Currently appears fully compensated, continue ARB, Lasix, add low-dose Coreg if tolerated by blood pressure and monitor.  ? ?Essential hypertension  - continue home dose ARB and diuretic, Coreg added for better control will monitor and adjust. ?  ?Parkinson's disease - Home regimen includes Sinemet 2.5 25-100 mg tablets 4 times daily, Sinemet 50-200 mg controlled release tablet nightly, entacapone 100 mg 3 times daily, and Seroquel.  Continue current regimen, according to the daughter notes that she has developed hallucinations thought to be related to entacapone will monitor for any side effects here. ? ?Hypothyroidism - on levothyroxine 75 mcg daily.  TSH mildly elevated, could be sick euthyroid PCP to repeat TSH in 4 to 6 weeks ?  ?Hyperlipidemia  - Continue pravastatin, LDL was under 70. ?  ?Chronic back pain - Patient is on chronic opioids and oxycodone, continue with caution avoid overdose. ?  ?Hypokalemia - replace IV + PO. ? ?Diabetes mellitus type 2, controlled  Hemoglobin A1c 6.3 which appears to be well controlled.  SSI here ? ?CBG (last 3)  ?Recent Labs  ?  02/14/22 ?1206 02/14/22 ?1635 02/14/22 ?2137  ?GLUCAP 142* 163* 97  ? ? ?   ? ?Condition - Extremely Guarded ? ?Family Communication  :  daughter Stephanie [name] [id number] 02/13/22,  02/14/22 ? ?Code Status :  Full ? ?Consults  :  Neuro ? ?PUD Prophylaxis :  ? ? Procedures  :    ? ?MRI Brain - 1. No acute intracranial abnormality or significant interval change. 2. Remote nonhemorrhagic lacunar infarct of the left basal ganglia. 3. Stable atrophy and white matter disease. This likely reflects the sequela of chronic microvascular ischemia. ? ?MRI C Spine  - 1. Progressive multilevel spondylosis of the cervical spine as described. 2. Moderate central canal stenosis is most evident at C4-5 and C5-6 without significant cord distortion or evidence for cord edema. 3. Progressive moderate foraminal narrowing bilaterally at C3-4, C4-5, and C5-6. 4. Progressive moderate left greater than right foraminal narrowing at C6-7. 5. Moderate left and mild right foraminal narrowing at C7-T1 is new. ? ?CT  A&P - Extensive patchy infiltrates are seen in the visualized both lower lung fields suggesting atelectasis/pneumonia. Small bilateral pleural effusions. Cardiomegaly. Small pericardial effusion. There is no evidence intestinal obstruction or pneumoperitoneum. There is no hydronephrosis. Small pelvic ascites. There is diffuse edema in the subcutaneous plane suggesting anasarca. Small hiatal hernia. Urinary bladder is distended with few diverticula. Possibility of bladder outlet obstruction is not excluded. ? ?TTE - 1. Left ventricular ejection fraction, by estimation, is 30 to 35%. The left ventricle has moderately decreased function. The left ventricle has no regional wall motion abnormalities. Left ventricular diastolic parameters are indeterminate.  2. Right ventricular systolic function is normal. The right ventricular size is normal.  3. Left atrial size was mildly dilated.  4. Right atrial size was mildly dilated.  5. The mitral valve is grossly normal. Mild mitral valve regurgitation.  6. Tricuspid valve regurgitation is mild to moderate.  7. The aortic valve is grossly normal. Aortic valve regurgitation is  mild. ? ?CT & CTA -  1. Age indeterminate Left Basal Ganglia Lacunar Infarct appears new since last year. Otherwise stable CT appearance of chronic small  vessel disease. 2. New left upper lobe lung opacity since last year, suspicious for Acute Pneumonia. 3. CTA is negative for large vessel occlusion. Positive for heavily calcified ICA siphons with moderate Left ICA siphon stenosis. No other Significant arterial stenosis in the head or neck. 4. Advanced chronic cervical spine degeneration ? ?   ? ?Disposition Plan  :   ? ?Status is: Inpatient ? ? ?DVT Prophylaxis  :  Heparin ? ?heparin injection 5,000 Units Start: 02/13/22 1400 ?Place and maintain sequential compression device Start: 02/12/22 1445 ? ?Lab Results  ?Component Value Date  ? PLT 210 02/15/2022  ? ? ?Diet :  ?Diet Order   ? ?       ?  DIET DYS 3 Room service appropriate? Yes; Fluid consistency: Thin  Diet effective now       ?  ? ?  ?  ? ?  ?  ? ?Inpatient Medications ? ?Scheduled Meds: ?  stroke: early stages of recovery book   Does not apply Once  ? allopurinol  100 mg Oral Daily  ? aspirin  325 mg Oral Daily  ? carbidopa-levodopa  1 tablet Oral QHS  ? carbidopa-levodopa  2.5 tablet Oral QID  ? carvedilol  3.125 mg Oral BID WC  ? entacapone  100 mg Oral TID  ? furosemide  40 mg Intravenous BID  ? gabapentin  300 mg Oral QHS  ? heparin injection (subcutaneous)  5,000 Units Subcutaneous Q8H  ? insulin aspart  0-9 Units Subcutaneous TID WC  ? levothyroxine  75 mcg Oral Daily  ? losartan  100 mg Oral Daily  ? potassium chloride  40 mEq Oral Once  ? pramipexole  0.125 mg Oral QID  ? pravastatin  40 mg Oral Daily  ? QUEtiapine  50 mg Oral QHS  ? sertraline  25 mg Oral Daily  ? ?Continuous Infusions: ? ampicillin-sulbactam (UNASYN) IV 3 g (02/15/22 0551)  ? doxycycline (VIBRAMYCIN) IV Stopped (02/14/22 2310)  ? magnesium sulfate bolus IVPB    ? ?PRN Meds:.acetaminophen **OR** acetaminophen (TYLENOL) oral liquid 160 mg/5 mL **OR** acetaminophen, fluticasone,  LORazepam, morphine injection ? ?Antibiotics  :   ? ?Anti-infectives (From admission, onward)  ? ? Start     Dose/Rate Route Frequency Ordered Stop  ? 02/12/22 0845  Ampicillin-Sulbactam (UNASYN) 3 g in sodiu

## 2022-02-15 NOTE — NC FL2 (Signed)
?[place] MEDICAID FL2 LEVEL OF CARE SCREENING TOOL  ?  ? ?IDENTIFICATION  ?Patient Name: ?Stephanie [name] Birthdate: Sep 25, 1946 Sex: female Admission Date (Current Location): ?02/12/2022  ?[place] and [place] Number: ? [place] ?  Facility and Address:  ?[facility], [place] [address], [place], [place] [address] ?     Provider Number: ?[id number]  ?Attending Physician Name and Address:  ?Thurnell Lose, MD ? Relative Name and Phone Number:  ?  ?   ?Current Level of Care: ?Hospital Recommended Level of Care: ?[place] Prior Approval Number: ?  ? ?Date Approved/Denied: ?  PASRR Number: ?[id number] A ? ?Discharge Plan: ?SNF ?  ? ?Current Diagnoses: ?Patient Active Problem List  ? Diagnosis Date Noted  ? Dysphagia 02/12/2022  ? Chronic back pain 02/12/2022  ? HFrEF (heart failure with reduced ejection fraction) ([place]) 02/12/2022  ? SIRS (systemic inflammatory response syndrome) ([place]) 02/12/2022  ? CAP (community acquired pneumonia) 09/01/2021  ? Acute on chronic HFrEF (heart failure with reduced ejection fraction) ([place]) 08/31/2021  ? Hypertension associated with diabetes ([place]) 08/31/2021  ? Acute on chronic systolic (congestive) heart failure ([place]) 07/20/2021  ? SOB (shortness of breath) 07/20/2021  ? Acute respiratory failure with hypoxia ([place]) 07/20/2021  ? Type 2 diabetes mellitus ([place])   ? Hypothyroid   ? Parkinson's disease ([place])   ? Hyperlipidemia associated with type 2 diabetes mellitus ([place])   ? Gout, unspecified 11/01/2020  ? Left bundle branch block 05/09/2019  ? Hyponatremia 12/31/2015  ? ? ?Orientation RESPIRATION BLADDER Height & Weight   ?  ?Self, Time, Situation, Place ? O2 (4L nasal cannula) Incontinent Weight: 137 lb 2 oz (62.2 kg) ?Height:  5' (152.4 cm)  ?BEHAVIORAL SYMPTOMS/MOOD NEUROLOGICAL BOWEL NUTRITION STATUS  ?    Incontinent Diet (See dc summary)  ?AMBULATORY STATUS COMMUNICATION OF NEEDS Skin   ?Limited Assist Verbally Normal ?  ?  ?  ?    ?     ?      ? ? ?Personal Care Assistance Level of Assistance  ?Bathing, Feeding, Dressing Bathing Assistance: Limited assistance ?Feeding assistance: Independent ?Dressing Assistance: Limited assistance ?   ? ?Functional Limitations Info  ?Sight Sight Info: Impaired ?  ?   ? ? ?SPECIAL CARE FACTORS FREQUENCY  ?PT (By licensed PT), OT (By licensed OT)   ?  ?PT Frequency: 5x/week ?OT Frequency: 5x/week ?  ?  ?  ?   ? ? ?Contractures Contractures Info: Not present  ? ? ?Additional Factors Info  ?Code Status, Allergies, Psychotropic, Insulin Sliding Scale Code Status Info: Full ?Allergies Info: NKA ?Psychotropic Info: Zoloft; Seroquel ?Insulin Sliding Scale Info: see dc summary ?  ?   ? ?Current Medications (02/15/2022):  This is the current hospital active medication list ?Current Facility-Administered Medications  ?Medication Dose Route Frequency Provider Last Rate Last Admin  ?  stroke: early stages of recovery book   Does not apply Once Norval Morton, MD      ? acetaminophen (TYLENOL) tablet 650 mg  650 mg Oral Q4H PRN Norval Morton, MD   650 mg at 02/14/22 2118  ? Or  ? acetaminophen (TYLENOL) 160 MG/5ML solution 650 mg  650 mg Per Tube Q4H PRN Fuller Plan A, MD      ? Or  ? acetaminophen (TYLENOL) suppository 650 mg  650 mg Rectal Q4H PRN Fuller Plan A, MD      ? allopurinol (ZYLOPRIM) tablet 100 mg  100 mg Oral Daily  Thurnell Lose, MD   100 mg at 02/15/22 9675  ? Ampicillin-Sulbactam (UNASYN) 3 g in sodium chloride 0.9 % 100 mL IVPB  3 g Intravenous Q6H Smith, Rondell A, MD 200 mL/hr at 02/15/22 1116 3 g at 02/15/22 1116  ? aspirin tablet 325 mg  325 mg Oral Daily Fuller Plan A, MD   325 mg at 02/15/22 0906  ? carbidopa-levodopa (SINEMET CR) 50-200 MG per tablet controlled release 1 tablet  1 tablet Oral QHS Smith, Rondell A, MD   1 tablet at 02/14/22 2111  ? carbidopa-levodopa (SINEMET IR) 25-100 MG per tablet immediate release 2.5 tablet  2.5 tablet Oral QID Fuller Plan A, MD   2.5 tablet at 02/15/22  1117  ? carvedilol (COREG) tablet 3.125 mg  3.125 mg Oral BID WC Thurnell Lose, MD   3.125 mg at 02/15/22 9163  ? doxycycline (VIBRAMYCIN) 100 mg in sodium chloride 0.9 % 250 mL IVPB  100 mg Intravenous Q12H Norval Morton, MD   Paused at 02/15/22 1057  ? entacapone (COMTAN) tablet 100 mg  100 mg Oral TID Fuller Plan A, MD   100 mg at 02/15/22 0906  ? fluticasone (FLONASE) 50 MCG/ACT nasal spray 1-2 spray  1-2 spray Each Nare Daily PRN Fuller Plan A, MD      ? furosemide (LASIX) injection 40 mg  40 mg Intravenous BID Fuller Plan A, MD   40 mg at 02/15/22 0906  ? gabapentin (NEURONTIN) capsule 300 mg  300 mg Oral QHS Smith, Rondell A, MD   300 mg at 02/14/22 2111  ? heparin injection 5,000 Units  5,000 Units Subcutaneous Q8H Thurnell Lose, MD   5,000 Units at 02/15/22 0545  ? insulin aspart (novoLOG) injection 0-9 Units  0-9 Units Subcutaneous TID WC Norval Morton, MD   2 Units at 02/14/22 1646  ? levothyroxine (SYNTHROID) tablet 75 mcg  75 mcg Oral Daily Fuller Plan A, MD   75 mcg at 02/15/22 0545  ? LORazepam (ATIVAN) injection 0.5 mg  0.5 mg Intravenous Q6H PRN Fuller Plan A, MD      ? losartan (COZAAR) tablet 100 mg  100 mg Oral Daily Tamala Julian, Rondell A, MD   100 mg at 02/15/22 8466  ? oxyCODONE-acetaminophen (PERCOCET/ROXICET) 5-325 MG per tablet 1 tablet  1 tablet Oral Q6H PRN Thurnell Lose, MD   1 tablet at 02/15/22 1117  ? pramipexole (MIRAPEX) tablet 0.125 mg  0.125 mg Oral QID Fuller Plan A, MD   0.125 mg at 02/15/22 0906  ? pravastatin (PRAVACHOL) tablet 40 mg  40 mg Oral Daily Fuller Plan A, MD   40 mg at 02/15/22 0906  ? QUEtiapine (SEROQUEL) tablet 50 mg  50 mg Oral QHS Smith, Rondell A, MD   50 mg at 02/14/22 2111  ? sertraline (ZOLOFT) tablet 25 mg  25 mg Oral Daily Fuller Plan A, MD   25 mg at 02/15/22 5993  ? ? ? ?Discharge Medications: ?Please see discharge summary for a list of discharge medications. ? ?Relevant Imaging Results: ? ?Relevant Lab  Results: ? ? ?Additional Information ?ss#[phone];[employer] x2 ? ?Lissa Morales Shubham Thackston, LCSW ? ? ? ? ?

## 2022-02-15 NOTE — Progress Notes (Signed)
Modified Barium Swallow Progress Note ? ?Patient Details  ?Name: Stephanie [name] ?MRN: [id number] ?Date of Birth: 1946/05/07 ? ?Today's Date: 02/15/2022 ? ?Modified Barium Swallow completed.  Full report located under Chart Review in the Imaging Section. ? ?Brief recommendations include the following: ? ?Clinical Impression ? Pt presents with a mild pharyngeal dysphagia, suspect similar in function to previous MBS. She does have swallow initiation at times at the pyriform sinuses with thin liquids, which did result in a single instance of trace, silent aspiration before the swallow with thin liquids via straw. Minimal residue was noted in the valleculae post-swallow, but was often reduced spontaneously. Of note, during esophageal sweep, pt was observed to have barium remaining in the esophagus including the barium pill. Pt reports feeling like things often get stuck or come back up. Question if her risk for aspiration may also be increased post-prandially from suspected esophageal issues. Would consider a mroe dedicated assessment of the esophagus if there is ongoing concern for aspiration. For now will leave on Dys 3 diet and thin liquids with upright positioning and small, single sips. ?  ?Swallow Evaluation Recommendations ? ? Recommended Consults: Consider esophageal assessment ? ? SLP Diet Recommendations: Dysphagia 3 (Mech soft) solids;Thin liquid ? ? Liquid Administration via: Cup;Straw ? ? Medication Administration: Crushed with puree ? ? Supervision: Patient able to self feed;Intermittent supervision to cue for compensatory strategies ? ? Compensations: Slow rate;Small sips/bites;Minimize environmental distractions;Follow solids with liquid ? ? Postural Changes: Remain semi-upright after after feeds/meals (Comment);Seated upright at 90 degrees ? ? Oral Care Recommendations: Oral care BID ? ?   ? ? ? ?Osie Bond., M.A. CCC-SLP ?Acute Rehabilitation Services ?Office [phone] ? ?Secure chat  preferred ? ?02/15/2022,4:34 PM ?

## 2022-02-15 NOTE — Care Management Important Message (Signed)
Important Message ? ?Patient Details  ?Name: Stephanie [name] ?MRN: [id number] ?Date of Birth: 1945-11-09 ? ? ?Medicare Important Message Given:  Yes ? ? ? ? ?Michaella Imai ?02/15/2022, 3:36 PM ?

## 2022-02-15 NOTE — Care Management Important Message (Signed)
Important Message ? ?Patient Details  ?Name: Stephanie [name] ?MRN: [id number] ?Date of Birth: 05/16/1946 ? ? ?Medicare Important Message Given:  Yes ? ? ? ? ?Aveleen Nevers ?02/15/2022, 3:43 PM ?

## 2022-02-15 NOTE — Progress Notes (Signed)
?   02/15/22 1045  ?Clinical Encounter Type  ?Visited With Patient  ?Visit Type Initial;Spiritual support  ?Referral From Nurse  ?Consult/Referral To Chaplain  ? ? ?Chaplain visited with patient and listened as patient shared life review. Chaplain encouraged patient to continue to connect to her religious beliefs and provided spiritual support through conversation and prayer. Patient requested a bible, large print (if available) and Chaplain will follow-up.  ? ? ?Melody Haver, Resident Chaplain ?[phone] ?

## 2022-02-16 DIAGNOSIS — R051 Acute cough: Secondary | ICD-10-CM | POA: Diagnosis not present

## 2022-02-16 DIAGNOSIS — J189 Pneumonia, unspecified organism: Secondary | ICD-10-CM | POA: Diagnosis not present

## 2022-02-16 LAB — CBC WITH DIFFERENTIAL/PLATELET
Abs Immature Granulocytes: 0.05 10*3/uL (ref 0.00–0.07)
Basophils Absolute: 0.1 10*3/uL (ref 0.0–0.1)
Basophils Relative: 1 %
Eosinophils Absolute: 0.2 10*3/uL (ref 0.0–0.5)
Eosinophils Relative: 3 %
HCT: 33.6 % — ABNORMAL LOW (ref 36.0–46.0)
Hemoglobin: 10.7 g/dL — ABNORMAL LOW (ref 12.0–15.0)
Immature Granulocytes: 1 %
Lymphocytes Relative: 12 %
Lymphs Abs: 1 10*3/uL (ref 0.7–4.0)
MCH: 29.2 pg (ref 26.0–34.0)
MCHC: 31.8 g/dL (ref 30.0–36.0)
MCV: 91.8 fL (ref 80.0–100.0)
Monocytes Absolute: 0.8 10*3/uL (ref 0.1–1.0)
Monocytes Relative: 10 %
Neutro Abs: 5.9 10*3/uL (ref 1.7–7.7)
Neutrophils Relative %: 73 %
Platelets: 226 10*3/uL (ref 150–400)
RBC: 3.66 MIL/uL — ABNORMAL LOW (ref 3.87–5.11)
RDW: 15.6 % — ABNORMAL HIGH (ref 11.5–15.5)
WBC: 8.1 10*3/uL (ref 4.0–10.5)
nRBC: 0 % (ref 0.0–0.2)

## 2022-02-16 LAB — COMPREHENSIVE METABOLIC PANEL
ALT: <5 U/L (ref 0–44)
AST: 17 U/L (ref 15–41)
Albumin: 2.7 g/dL — ABNORMAL LOW (ref 3.5–5.0)
Alkaline Phosphatase: 70 U/L (ref 38–126)
Anion gap: 9 (ref 5–15)
BUN: 13 mg/dL (ref 8–23)
CO2: 31 mmol/L (ref 22–32)
Calcium: 8.2 mg/dL — ABNORMAL LOW (ref 8.9–10.3)
Chloride: 99 mmol/L (ref 98–111)
Creatinine, Ser: 0.74 mg/dL (ref 0.44–1.00)
GFR, Estimated: >60 mL/min (ref >60)
Glucose, Bld: 111 mg/dL — ABNORMAL HIGH (ref 70–99)
Potassium: 3.6 mmol/L (ref 3.5–5.1)
Sodium: 139 mmol/L (ref 135–145)
Total Bilirubin: 0.9 mg/dL (ref 0.3–1.2)
Total Protein: 6.1 g/dL — ABNORMAL LOW (ref 6.5–8.1)

## 2022-02-16 LAB — BRAIN NATRIURETIC PEPTIDE: B Natriuretic Peptide: 1062.1 pg/mL — ABNORMAL HIGH (ref 0.0–100.0)

## 2022-02-16 LAB — MAGNESIUM: Magnesium: 1.7 mg/dL (ref 1.7–2.4)

## 2022-02-16 LAB — GLUCOSE, CAPILLARY
Glucose-Capillary: 132 mg/dL — ABNORMAL HIGH (ref 70–99)
Glucose-Capillary: 211 mg/dL — ABNORMAL HIGH (ref 70–99)

## 2022-02-16 LAB — C-REACTIVE PROTEIN: CRP: 8.1 mg/dL — ABNORMAL HIGH (ref <1.0)

## 2022-02-16 MED ORDER — SPIRONOLACTONE 25 MG PO TABS
50.0000 mg | ORAL_TABLET | Freq: Once | ORAL | Status: AC
  Administered 2022-02-16: 50 mg via ORAL
  Filled 2022-02-16: qty 2

## 2022-02-16 MED ORDER — MAGNESIUM SULFATE 2 GM/50ML IV SOLN
2.0000 g | Freq: Once | INTRAVENOUS | Status: AC
  Administered 2022-02-16: 2 g via INTRAVENOUS
  Filled 2022-02-16: qty 50

## 2022-02-16 MED ORDER — TORSEMIDE 20 MG PO TABS: 40.0000 mg | ORAL_TABLET | Freq: Every day | ORAL | Status: DC

## 2022-02-16 MED ORDER — ASPIRIN EC 81 MG PO TBEC: 81.0000 mg | DELAYED_RELEASE_TABLET | Freq: Every day | ORAL | 0 refills | Status: DC

## 2022-02-16 MED ORDER — AMOXICILLIN-POT CLAVULANATE 875-125 MG PO TABS: 1.0000 | ORAL_TABLET | Freq: Two times a day (BID) | ORAL | 0 refills | Status: AC

## 2022-02-16 MED ORDER — FUROSEMIDE 10 MG/ML IJ SOLN
60.0000 mg | Freq: Once | INTRAMUSCULAR | Status: AC
  Administered 2022-02-16: 60 mg via INTRAVENOUS
  Filled 2022-02-16: qty 6

## 2022-02-16 MED ORDER — OXYCODONE-ACETAMINOPHEN 5-325 MG PO TABS: 1.0000 | ORAL_TABLET | Freq: Four times a day (QID) | ORAL | 0 refills | Status: DC | PRN

## 2022-02-16 MED ORDER — POTASSIUM CHLORIDE CRYS ER 20 MEQ PO TBCR
40.0000 meq | EXTENDED_RELEASE_TABLET | Freq: Once | ORAL | Status: AC
  Administered 2022-02-16: 40 meq via ORAL
  Filled 2022-02-16: qty 2

## 2022-02-16 NOTE — Evaluation (Signed)
Speech Language Pathology Evaluation ?Patient Details ?Name: Stephanie [name] ?MRN: [id number] ?DOB: 10-14-1946 ?Today's Date: 02/16/2022 ?Time: 1110-1130 ?SLP Time Calculation (min) (ACUTE ONLY): 20 min ? ?Problem List:  ?Patient Active Problem List  ? Diagnosis Date Noted  ? Dysphagia 02/12/2022  ? Chronic back pain 02/12/2022  ? HFrEF (heart failure with reduced ejection fraction) ([place]) 02/12/2022  ? SIRS (systemic inflammatory response syndrome) ([place]) 02/12/2022  ? CAP (community acquired pneumonia) 09/01/2021  ? Acute on chronic HFrEF (heart failure with reduced ejection fraction) ([place]) 08/31/2021  ? Hypertension associated with diabetes ([place]) 08/31/2021  ? Acute on chronic systolic (congestive) heart failure ([place]) 07/20/2021  ? SOB (shortness of breath) 07/20/2021  ? Acute respiratory failure with hypoxia ([place]) 07/20/2021  ? Type 2 diabetes mellitus ([place])   ? Hypothyroid   ? Parkinson's disease ([place])   ? Hyperlipidemia associated with type 2 diabetes mellitus ([place])   ? Gout, unspecified 11/01/2020  ? Left bundle branch block 05/09/2019  ? Hyponatremia 12/31/2015  ? ?Past Medical History:  ?Past Medical History:  ?Diagnosis Date  ? Anemia   ? Arthritis   ? CHF (congestive heart failure) ([place])   ? Chronic back pain   ? Diabetes mellitus without complication ([place])   ? Dysrhythmia   ? GERD (gastroesophageal reflux disease)   ? Hyperlipidemia   ? Hypertension   ? Hypothyroid   ? Multilevel degenerative disc disease   ? Parkinson's disease ([place])   ? Sleep apnea   ? Syncope   ? Tremors of nervous system   ? Wears dentures   ? full upper and lower  ? ?Past Surgical History:  ?Past Surgical History:  ?Procedure Laterality Date  ? ABDOMINAL HYSTERECTOMY    ? BACK SURGERY  09/16  ? CARDIAC CATHETERIZATION    ? CATARACT EXTRACTION W/PHACO Right 11/24/2015  ? Procedure: CATARACT EXTRACTION PHACO AND INTRAOCULAR LENS PLACEMENT (IOC);  Surgeon: Estill Cotta, MD;  Location: [facility] ORS;  Service: Ophthalmology;  Laterality:  Right;  [place] 01:14 ?AP% 25.5 ?CDE 30.69 ?fluid pack lot # [id number]  ? CATARACT EXTRACTION W/PHACO Left 01/06/2021  ? Procedure: CATARACT EXTRACTION PHACO AND INTRAOCULAR LENS PLACEMENT (IOC) LEFT DIABETIC 6.98 00:40.1;  Surgeon: Birder Robson, MD;  Location: [facility];  Service: Ophthalmology;  Laterality: Left;  Diabetic - oral meds  ? COLONOSCOPY    ? COLONOSCOPY WITH PROPOFOL N/A 07/24/2019  ? Procedure: COLONOSCOPY WITH PROPOFOL;  Surgeon: Toledo, Benay Pike, MD;  Location: [facility] ENDOSCOPY;  Service: Gastroenterology;  Laterality: N/A;  ? ESOPHAGOGASTRODUODENOSCOPY N/A 07/24/2019  ? Procedure: ESOPHAGOGASTRODUODENOSCOPY (EGD);  Surgeon: Toledo, Benay Pike, MD;  Location: [facility] ENDOSCOPY;  Service: Gastroenterology;  Laterality: N/A;  ? OOPHORECTOMY    ? ?HPI:  ?Stephanie [name] is a 76 y.o. female with medical history significant of hypertension, hyperlipidemia, HFrEF EF 25-30%, diabetes mellitus type 2, Parkinson disease, history of dysphagia, anxiety, hypothyroidism, chronic back pain, sleep apnea on 2 L of nasal cannula oxygen at night who presented due to worsening weakness, slurred speech, and confusion.  History is obtained from the patient, her daughter over the phone, and review of records.  Last  reported be normal around 9:30 PM last night.  Patient reports that she has been having worsening left-sided weakness  for several weeks to months of complaints of bilateral leg tingling.  Associated symptoms included reports of falls, back pain, nausea, vomiting, and changes in vision.  Patient reports that her speech is changed due to her mouth being so dry.  She  was diagnosed with questionable stroke, CAP, and SIRS.  Most recent head CT was showing age indeterminate left basal ganglia infarct.  Chest xray was suspicious for acute on chronic lung disease, consider acute infection or less likely assymetric edema superimposed on a chronic ILD.  ? ?Assessment / Plan / Recommendation ?Clinical Impression ?  Patient presents with cognitive-linguistic abilities which are WFL-WNL. Speech is dysarthric however appears to be impacted significantly by her ill-fitting top dentures which are noticeably loose and slipping down as patient talks. SLP administered the SLUMS ([place] Mental Status exam) and patient received a score of 27 out of 30 which places her in the range of normal (HS education 27-30; patient reported completing 9th grade and getting her GED). SLP is recommending that patient consider refitting/replacement of her dentures as this does appear to be impacting her speech. Recommendation already made for f/u SLP at next venue of care for her dysphagia. This SLP is not recommending f/u for cognition or language and as speech production does appear to be impacted by ill fitting dentures, recommend f/u SLP if patient's speech does not improve after patient is able to have dentures repaired/replaced. ?   ?SLP Assessment ? SLP Recommendation/Assessment: Patient does not need any further [place] [facility]  ?  ?Recommendations for follow up therapy are one component of a multi-disciplinary discharge planning process, led by the attending physician.  Recommendations may be updated based on patient status, additional functional criteria and insurance authorization. ?   ?Follow Up Recommendations ? No SLP follow up  ?  ?Assistance Recommended at Discharge ? Intermittent Supervision/Assistance  ?Functional Status Assessment Patient has had a recent decline in their functional status and demonstrates the ability to make significant improvements in function in a reasonable and predictable amount of time.  ?Frequency and Duration    ?  ?  ?   ?SLP Evaluation ?Cognition ? Overall Cognitive Status: Within Functional Limits for tasks assessed ?Orientation Level: Oriented X4 ?Year: 2023 ?Month: April ?Day of Week: Correct ?Attention: Selective ?Selective Attention: Appears intact ?Memory: Appears  intact ?Awareness: Appears intact ?Problem Solving: Appears intact ?Safety/Judgment: Appears intact  ?  ?   ?Comprehension ? Auditory Comprehension ?Overall Auditory Comprehension: Appears within functional limits for tasks assessed  ?  ?Expression Expression ?Primary Mode of Expression: Verbal ?Verbal Expression ?Overall Verbal Expression: Appears within functional limits for tasks assessed   ?Oral / Motor ? Oral Motor/Sensory Function ?Overall Oral Motor/Sensory Function: Mild impairment ?Facial ROM: Reduced left ?Facial Symmetry: Abnormal symmetry left ?Lingual ROM: Within Functional Limits ?Lingual Symmetry: Within Functional Limits ?Lingual Strength: Within Functional Limits   ?        ?Sonia Baller, MA, CCC-SLP ?Speech Therapy ? ? ? ?

## 2022-02-16 NOTE — Progress Notes (Signed)
?   02/16/22 1400  ?Clinical Encounter Type  ?Visited With Patient  ?Visit Type Follow-up  ?Referral From Chaplain;Nurse ?(Reference passed on by Harrie Foreman)  ?Consult/Referral To Chaplain  ? ? ?Chaplain followed-up with patient and delivered Bible as indicated. Patient indicated that she would be discharged to rehab unit soon and was concerned. Chaplain also provided emotional support and spiritual support.  ? ?Melody Haver, Resident Chaplain ?([phone] ?

## 2022-02-16 NOTE — Discharge Instructions (Signed)
Follow with Primary MD [name], MD in 7 days  ? ?Get CBC, CMP, 2 view Chest X ray -  checked next visit within 1 week by SNF MD  ? ?Activity: As tolerated with Full fall precautions use walker/cane & assistance as needed ? ?Disposition SNF ? ?Diet: Dysphagia 3 diet with feeding assistance and aspiration precautions. ? ?Special Instructions: If you have smoked or chewed Tobacco  in the last 2 yrs please stop smoking, stop any regular Alcohol  and or any Recreational drug use. ? ?On your next visit with your primary care physician please Get Medicines reviewed and adjusted. ? ?Please request your Prim.MD to go over all Hospital Tests and Procedure/Radiological results at the follow up, please get all Hospital records sent to your Prim MD by signing hospital release before you go home. ? ?If you experience worsening of your admission symptoms, develop shortness of breath, life threatening emergency, suicidal or homicidal thoughts you must seek medical attention immediately by calling 911 or calling your MD immediately  if symptoms less severe. ? ?You Must read complete instructions/literature along with all the possible adverse reactions/side effects for all the Medicines you take and that have been prescribed to you. Take any new Medicines after you have completely understood and accpet all the possible adverse reactions/side effects.  ? ?  ? ?

## 2022-02-16 NOTE — Progress Notes (Signed)
Patient's discharge report was given to Webb Silversmith, Therapist, sports.  ?

## 2022-02-16 NOTE — Discharge Summary (Signed)
?                                                                                ? ?Stephanie [name] XFG:[id number] DOB: Mar 04, 1946 DOA: 02/12/2022 ? ?PCP: [name], MD ? ?Admit date: 02/12/2022  Discharge date: 02/16/2022 ? ?Admitted From: Home   Disposition:  SNF ? ? ?Recommendations for Outpatient Follow-up:  ? ?Follow up with PCP in 1-2 weeks ? ?PCP Please obtain BMP/CBC, 2 view CXR in 1week,  (see Discharge instructions)  ? ?PCP Please follow up on the following pending results:  ? ? ?Home Health: None   ?Equipment/Devices: None  ?Consultations: Neuro ?Discharge Condition: Stable    ?CODE STATUS: Full    ?Diet Recommendation: Dysphagia 3 diet with feeding assistance and aspiration precautions ?  ? ?Chief Complaint  ?Patient presents with  ? Stroke Symptoms  ?  BIBA from home. Lives with daughter. Pt c/o being unable to ambulate at home, L sided facial droop, slurred speech -per daughter.LKW '@2130'$   ?  ? ?Brief history of present illness from the day of admission and additional interim summary   ? ?76 y.o. female with medical history significant of hypertension, hyperlipidemia, HFrEF EF 25-30%, diabetes mellitus type 2, Parkinson disease, history of dysphagia, anxiety, hypothyroidism, chronic back pain, sleep apnea on 2 L of nasal cannula oxygen at night who presented due to worsening weakness, slurred speech, and confusion.  In the ER CT scan of the head was nonacute neurology was consulted and she was admitted for stroke work-up long with possible URI versus pneumonia. ? ?                                                               Hospital Course  ? ? Dysphagia and left-sided weakness  history of CVA  - Patient noted to be slurring speech with reports of left-sided weakness at home,  CT brain and CTA brain were nonacute but did show left age indeterminant basal ganglia lacunar infarct, neurology has seen the  patient.  She is currently on aspirin and statin for secondary prevention and this will be continued, A1c and LDL are at goal.  Echo nonacute.  MRI brain non acute and rule out any acute infarct.  PT, OT and speech.  Will require SNF. ?  ? Community-acquired pneumonia - Acute.  Patient with prior history of dysphagia and suspected aspiration pneumonia in the past.  Speech therapy f for the patient she has been placed on dysphagia 3 diet with feeding assistance and aspiration precautions, responded well to IV antibiotics and will be transition to oral Augmentin for 3 more [facility] at [facility] . ?  ?Questionable mild left lower quadrant pain and tenderness.  Completely resolved with supportive care CT abdomen pelvis unremarkable. ?  ?Chronic systolic heart failure last known EF was 30% now seems to have improved to 35%.  Currently appears fully compensated, continue ARB, Lasix, continue home dose beta-blocker.  ?  ?Essential hypertension  -  continue home dose ARB and diuretic, resume home dose beta-blocker. ?  ?Parkinson's disease - Home regimen includes Sinemet 2.5 25-100 mg tablets 4 times daily, Sinemet 50-200 mg controlled release tablet nightly, entacapone 100 mg 3 times daily, and Seroquel.  Continue current regimen, according to the daughter notes that she has developed hallucinations thought to be related to entacapone will monitor for any side effects here. ?  ?Hypothyroidism - on levothyroxine 75 mcg daily.  TSH mildly elevated, could be sick euthyroid PCP to repeat TSH, free T4 and T3 in 4 to 6 weeks ?  ?Hyperlipidemia  - Continue pravastatin, LDL was under 70. ?  ?Chronic back pain - Patient is on chronic opioids and oxycodone, continue with caution avoid overdose. ?  ?Diabetes mellitus type 2, controlled  Hemoglobin A1c 6.3 which appears to be well controlled.  Continue home regimen ? ? ?Discharge diagnosis   ? ? ?Principal Problem: ?  Dysphagia ?Active Problems: ?  Type 2 diabetes mellitus ([place]) ?  Hypothyroid ?   Parkinson's disease ([place]) ?  Hyperlipidemia associated with type 2 diabetes mellitus ([place]) ?  CAP (community acquired pneumonia) ?  Chronic back pain ?  HFrEF (heart failure with reduced ejection fraction) ([place]) ?  SIRS (systemic inflammatory response syndrome) (HCC) ? ? ? ?Discharge instructions   ? ?Discharge Instructions   ? ? Discharge instructions   Complete by: As directed ?  ? Follow with Primary MD [name], MD in 7 days  ? ?Get CBC, CMP, 2 view Chest X ray -  checked next visit within 1 week by SNF MD  ? ?Activity: As tolerated with Full fall precautions use walker/cane & assistance as needed ? ?Disposition SNF ? ?Diet: Dysphagia 3 diet with feeding assistance and aspiration precautions. ? ?Special Instructions: If you have smoked or chewed Tobacco  in the last 2 yrs please stop smoking, stop any regular Alcohol  and or any Recreational drug use. ? ?On your next visit with your primary care physician please Get Medicines reviewed and adjusted. ? ?Please request your Prim.MD to go over all Hospital Tests and Procedure/Radiological results at the follow up, please get all Hospital records sent to your Prim MD by signing hospital release before you go home. ? ?If you experience worsening of your admission symptoms, develop shortness of breath, life threatening emergency, suicidal or homicidal thoughts you must seek medical attention immediately by calling 911 or calling your MD immediately  if symptoms less severe. ? ?You Must read complete instructions/literature along with all the possible adverse reactions/side effects for all the Medicines you take and that have been prescribed to you. Take any new Medicines after you have completely understood and accpet all the possible adverse reactions/side effects.  ? Increase activity slowly   Complete by: As directed ?  ? ?  ? ? ?Discharge Medications  ? ?Allergies as of 02/16/2022   ?No Known Allergies ?  ? ?  ?Medication List  ?  ? ?STOP taking these  medications   ? ?ibuprofen 200 MG tablet ?Commonly known as: ADVIL ?  ? ?  ? ?TAKE these medications   ? ?allopurinol 100 MG tablet ?Commonly known as: ZYLOPRIM ?Take 100 mg by mouth daily. ?  ?amoxicillin-clavulanate 875-125 MG tablet ?Commonly known as: Augmentin ?Take 1 tablet by mouth 2 (two) times daily for 3 days. ?  ?aspirin EC 81 MG tablet ?Take 1 tablet (81 mg total) by mouth daily. ?  ?carbidopa-levodopa 25-100 MG tablet ?Commonly known as:  SINEMET IR ?Take 2.5 tablets by mouth 4 (four) times daily. ?  ?carbidopa-levodopa 50-200 MG tablet ?Commonly known as: SINEMET CR ?Take 1 tablet by mouth at bedtime. ?  ?entacapone 200 MG tablet ?Commonly known as: COMTAN ?Take 100 mg by mouth 3 (three) times daily. ?  ?fluticasone 50 MCG/ACT nasal spray ?Commonly known as: FLONASE ?Place 1-2 sprays into the nose daily as needed for allergies. ?  ?FreeStyle Libre 14 Day Sensor Misc ?by Does not apply route. ?  ?gabapentin 300 MG capsule ?Commonly known as: NEURONTIN ?Take 300 mg by mouth at bedtime. ?  ?glucose 4 GM chewable tablet ?Chew 1 tablet by mouth as needed for low blood sugar. ?  ?levothyroxine 75 MCG tablet ?Commonly known as: SYNTHROID ?Take 75 mcg by mouth daily. ?  ?losartan 100 MG tablet ?Commonly known as: COZAAR ?Take 1 tablet (100 mg total) by mouth daily. ?  ?metFORMIN 1000 MG tablet ?Commonly known as: GLUCOPHAGE ?Take 1,000 mg by mouth 2 (two) times daily with a meal. ?  ?metoprolol succinate 50 MG 24 hr tablet ?Commonly known as: TOPROL-XL ?Take 50 mg by mouth daily. Take with or immediately following a meal. ?  ?multivitamin with minerals Tabs tablet ?Take 1 tablet by mouth daily. ?  ?oxyCODONE-acetaminophen 5-325 MG tablet ?Commonly known as: PERCOCET/ROXICET ?Take 1 tablet by mouth every 6 (six) hours as needed for severe pain. ?  ?pramipexole 0.125 MG tablet ?Commonly known as: MIRAPEX ?Take 0.125 mg by mouth 4 (four) times daily. ?  ?pravastatin 40 MG tablet ?Commonly known as: PRAVACHOL ?Take  40 mg by mouth daily. ?  ?QUEtiapine 50 MG tablet ?Commonly known as: SEROQUEL ?Take 50 mg by mouth at bedtime. ?  ?sertraline 25 MG tablet ?Commonly known as: ZOLOFT ?Take 1 tablet (25 mg total) by mouth

## 2022-02-16 NOTE — TOC Transition Note (Signed)
Transition of Care (TOC) - CM/SW Discharge Note ? ? ?Patient Details  ?Name: Stephanie [name] ?MRN: [id number] ?Date of Birth: 17-Nov-1945 ? ?Transition of Care (TOC) CM/SW Contact:  ?Benard Halsted, LCSW ?Phone Number: ?02/16/2022, 11:09 AM ? ? ?Clinical Narrative:    ?Patient will DC to: [facility] ?Anticipated DC date: 02/16/22 ?Family notified: Daughter, Butch Penny ?Transport by: Corey Harold ? ? ?Per MD patient ready for DC to [facility]. RN to call report prior to discharge [phone] Room 506). RN, patient, patient's family, and facility notified of DC. Discharge Summary and FL2 sent to facility. DC packet on chart. Ambulance transport requested for patient.  ? ?CSW will sign off for now as social work intervention is no longer needed. Please consult [place] again if new needs arise. ? ? ? ? ?Final next level of care: [facility] ?Barriers to Discharge: Barriers Resolved ? ? ?Patient Goals and CMS Choice ?Patient states their goals for this hospitalization and ongoing recovery are:: Rehab ?CMS Medicare.gov Compare Post Acute Care list provided to:: Patient ?Choice offered to / list presented to : Patient, Adult Children ? ?Discharge Placement ?  ?Existing PASRR number confirmed : 02/16/22          ?Patient chooses bed at: [facility] ?Patient to be transferred to facility by: [facility] ?Name of family member notified: Daughter ?Patient and family notified of of transfer: 02/16/22 ? ?Discharge Plan and Services ?In-house Referral: Clinical Social Work ?Discharge Planning Services: CM Consult ?Post Acute Care Choice: [place]          ?  ?  ?  ?  ?  ?  ?  ?  ?  ?  ? ?Social Determinants of Health (SDOH) Interventions ?  ? ? ?Readmission Risk Interventions ? ?  09/03/2021  ?  4:31 PM  ?Readmission Risk Prevention Plan  ?Transportation Screening Complete  ?PCP or Specialist Appt within 3-5 Days Complete  ?[facility] or Home Care Consult Complete  ?Social Work Consult for [place] Planning/Counseling  Complete  ?Palliative Care Screening Not Applicable  ?Medication Review Press photographer) Complete  ? ? ? ? ? ?

## 2022-02-17 LAB — CULTURE, BLOOD (ROUTINE X 2)
Culture: NO GROWTH
Culture: NO GROWTH
Special Requests: ADEQUATE
Special Requests: ADEQUATE

## 2022-03-14 NOTE — Progress Notes (Deleted)
Patient ID: Stephanie [name], female    DOB: [birth year], 76 y.o.   MRN: [id number]  Ms Krasner is a 76 y/o female with a history of DM, hyperlipidemia, HTN, thyroid disease, anemia, GERD, Parkinson's disease, sleep apnea and chronic heart failure.   Echo report from 07/21/21 reviewed and showed an EF of 25-30% along with severe MR/TR and mild AR.   Admitted 02/12/22 due to                                 Neurology consult obtained.                    Discharged after 4 days.   She presents today for a follow-up visit with chief complaint of    Past Medical History:  Diagnosis Date   Anemia    Arthritis    CHF (congestive heart failure) (HCC)    Chronic back pain    Diabetes mellitus without complication ([place])    Dysrhythmia    GERD (gastroesophageal reflux disease)    Hyperlipidemia    Hypertension    Hypothyroid    Multilevel degenerative disc disease    Parkinson's disease ([place])    Sleep apnea    Syncope    Tremors of nervous system    Wears dentures    full upper and lower   Past Surgical History:  Procedure Laterality Date   ABDOMINAL HYSTERECTOMY     BACK SURGERY  09/16   CARDIAC CATHETERIZATION     CATARACT EXTRACTION W/PHACO Right 11/24/2015   Procedure: CATARACT EXTRACTION PHACO AND INTRAOCULAR LENS PLACEMENT ([place]);  Surgeon: Estill Cotta, MD;  Location: [facility] ORS;  Service: Ophthalmology;  Laterality: Right;  [place] 01:14 AP% 25.5 CDE 30.69 fluid pack lot # [id number]   CATARACT EXTRACTION W/PHACO Left 01/06/2021   Procedure: CATARACT EXTRACTION PHACO AND INTRAOCULAR LENS PLACEMENT ([facility]) LEFT DIABETIC 6.98 00:40.1;  Surgeon: Birder Robson, MD;  Location: [facility];  Service: Ophthalmology;  Laterality: Left;  Diabetic - oral meds   COLONOSCOPY     COLONOSCOPY WITH PROPOFOL N/A 07/24/2019   Procedure: COLONOSCOPY WITH PROPOFOL;  Surgeon: Toledo, Benay Pike, MD;  Location: [facility];  Service: Gastroenterology;  Laterality: N/A;    ESOPHAGOGASTRODUODENOSCOPY N/A 07/24/2019   Procedure: ESOPHAGOGASTRODUODENOSCOPY (EGD);  Surgeon: Toledo, Benay Pike, MD;  Location: [facility] END[facility];  Service: Gastroenterology;  Laterality: N/A;   OOPHORECTOMY     Family History  Problem Relation Age of Onset   Breast cancer Paternal Aunt    Social History   Tobacco Use   Smoking status: Never   Smokeless tobacco: Never  Substance Use Topics   Alcohol use: Not Currently    Comment: rare   No Known Allergies   Review of Systems  Constitutional:  Positive for appetite change (decreased appetite) and fatigue.  HENT:  Negative for congestion, postnasal drip and sore throat.   Respiratory:  Positive for shortness of breath. Negative for cough and chest tightness.   Cardiovascular:  Positive for leg swelling. Negative for chest pain and palpitations.  Gastrointestinal:  Negative for abdominal distention and abdominal pain.  Endocrine: Negative.   Genitourinary: Negative.   Musculoskeletal:  Positive for back pain and neck pain.  Skin: Negative.   Allergic/Immunologic: Negative.   Neurological:  Positive for tremors and weakness. Negative for dizziness and light-headedness.  Hematological:  Negative for adenopathy. Bruises/bleeds easily.  Psychiatric/Behavioral:  Positive for agitation, confusion,  hallucinations and sleep disturbance (not sleeping much due to hallucinations). Negative for dysphoric mood, self-injury and suicidal ideas. The patient is not nervous/anxious.      Physical Exam Vitals and nursing note reviewed. Exam conducted with a chaperone present (daughter).  Constitutional:      General: She is not in acute distress.    Appearance: Normal appearance.  HENT:     Head: Normocephalic and atraumatic.  Cardiovascular:     Rate and Rhythm: Normal rate and regular rhythm.  Pulmonary:     Effort: Pulmonary effort is normal. No respiratory distress.     Breath sounds: No wheezing or rales.  Abdominal:     General:  There is no distension.     Palpations: Abdomen is soft.  Musculoskeletal:        General: No tenderness.     Cervical back: Normal range of motion and neck supple.     Right lower leg: Edema (+2) present.     Left lower leg: Edema (+2) present.  Skin:    General: Skin is warm and dry.     Findings: No bruising.  Neurological:     General: No focal deficit present.     Mental Status: She is alert and oriented to person, place, and time.  Psychiatric:        Attention and Perception: She perceives auditory hallucinations.        Mood and Affect: Mood is anxious. Affect is tearful (at times).        Speech: Speech is rapid and pressured.        Behavior: Behavior is cooperative.        Thought Content: Thought content does not include homicidal or suicidal plan.    Assessment & Plan:  1: Chronic heart failure with reduced ejection fraction- - NYHA class III - euvolemic today - not weighing daily; advised to call for an overnight weight gain of > 2 pounds or a weekly weight gain of > 5 pounds - weight 146.8 pounds from last visit here 1 month ago - not adding salt and her daughter is doing most of the cooking and hasn't been using salt either - advised to keep daily fluid intake to between 60-64 ounces/ day - saw cardiology Petra Kuba) 12/10/21 - on GDMT of metoprolol succinate and losartan - was on Jardiance, cannot afford it and she is not eligible for patient assistance - not taking diuretic because she's afraid to use the bathroom because she says she's being watched - BNP 02/16/22 was 1062.1  2: HTN- - BP  - saw PCP ([facility]) 01/07/22 - BMP 02/16/22 reviewed and showed sodium 139, potassium 3.6, creatinine 0.74 and GFR >60  3: DM- - A1c 01/07/22 was 6.3% - glucose at home today was   4: Parkinson's- - saw neurology Manuella Ghazi) 11/20/21 - currently on sinemet CR and entacapone  5: Auditory hallucinations- - talked extensively to patient and daughter in the room about her  hallucinations and how this is affecting her physical health since she's not sleeping hardly any because she's afraid, not eating and not taking her diuretic (and probably other medications) consisently - discussed that I felt she needed to go to the ED to have this addressed where she could be safely monitored but patient and daughter present refuse - patient says she does not want to go because "they'll lock her up for 20 years in a padded room" - daughter that is present lives 2 hours away but will discuss with  her sister that the patient lives with - I have called her PCP ([facility]) and neurology Manuella Ghazi) to advise of this - currently no suicidal/ homicidal thoughts   Patient did not bring her medications nor a list. Each medication was verbally reviewed with the patient and she was encouraged to bring the bottles to every visit to confirm accuracy of list.

## 2022-03-15 ENCOUNTER — Telehealth: Payer: Self-pay | Admitting: Family

## 2022-03-15 ENCOUNTER — Ambulatory Visit: Payer: Medicare Other | Admitting: Family

## 2022-03-15 NOTE — Telephone Encounter (Signed)
Patient did not show for her Heart Failure Clinic appointment on 03/15/22. Will attempt to reschedule.   ?

## 2022-03-24 ENCOUNTER — Ambulatory Visit: Payer: Medicare Other | Admitting: Family

## 2022-03-24 ENCOUNTER — Telehealth: Payer: Self-pay | Admitting: Family

## 2022-03-24 NOTE — Telephone Encounter (Signed)
Patient did not show for her Heart Failure Clinic appointment on 03/24/22. Will attempt to reschedule.

## 2022-04-22 ENCOUNTER — Other Ambulatory Visit: Payer: Self-pay | Admitting: Internal Medicine

## 2022-04-22 DIAGNOSIS — R1012 Left upper quadrant pain: Secondary | ICD-10-CM

## 2022-05-03 ENCOUNTER — Ambulatory Visit
Admission: RE | Admit: 2022-05-03 | Discharge: 2022-05-03 | Disposition: A | Discharge status: Home / Self Care | Payer: Medicare Other | Source: Ambulatory Visit | Attending: Internal Medicine | Admitting: Internal Medicine

## 2022-05-03 DIAGNOSIS — R1012 Left upper quadrant pain: Secondary | ICD-10-CM | POA: Insufficient documentation

## 2022-10-08 ENCOUNTER — Inpatient Hospital Stay
Admission: EM | Admit: 2022-10-08 | Discharge: 2022-10-11 | DRG: 558 | Disposition: A | Discharge status: Home Health | Payer: Medicare Other | Attending: Hospitalist | Admitting: Hospitalist

## 2022-10-08 ENCOUNTER — Emergency Department: Payer: Medicare Other

## 2022-10-08 ENCOUNTER — Observation Stay: Payer: Medicare Other

## 2022-10-08 DIAGNOSIS — M549 Dorsalgia, unspecified: Secondary | ICD-10-CM | POA: Diagnosis present

## 2022-10-08 DIAGNOSIS — I5022 Chronic systolic (congestive) heart failure: Secondary | ICD-10-CM | POA: Diagnosis present

## 2022-10-08 DIAGNOSIS — I5020 Unspecified systolic (congestive) heart failure: Secondary | ICD-10-CM | POA: Diagnosis present

## 2022-10-08 DIAGNOSIS — E1169 Type 2 diabetes mellitus with other specified complication: Secondary | ICD-10-CM

## 2022-10-08 DIAGNOSIS — M25461 Effusion, right knee: Secondary | ICD-10-CM

## 2022-10-08 DIAGNOSIS — L539 Erythematous condition, unspecified: Secondary | ICD-10-CM | POA: Diagnosis present

## 2022-10-08 DIAGNOSIS — E785 Hyperlipidemia, unspecified: Secondary | ICD-10-CM | POA: Diagnosis present

## 2022-10-08 DIAGNOSIS — Z9841 Cataract extraction status, right eye: Secondary | ICD-10-CM

## 2022-10-08 DIAGNOSIS — M109 Gout, unspecified: Secondary | ICD-10-CM | POA: Diagnosis present

## 2022-10-08 DIAGNOSIS — Z90710 Acquired absence of both cervix and uterus: Secondary | ICD-10-CM

## 2022-10-08 DIAGNOSIS — R634 Abnormal weight loss: Secondary | ICD-10-CM

## 2022-10-08 DIAGNOSIS — K219 Gastro-esophageal reflux disease without esophagitis: Secondary | ICD-10-CM | POA: Diagnosis present

## 2022-10-08 DIAGNOSIS — G4730 Sleep apnea, unspecified: Secondary | ICD-10-CM | POA: Diagnosis present

## 2022-10-08 DIAGNOSIS — I110 Hypertensive heart disease with heart failure: Secondary | ICD-10-CM | POA: Diagnosis present

## 2022-10-08 DIAGNOSIS — R262 Difficulty in walking, not elsewhere classified: Secondary | ICD-10-CM | POA: Diagnosis present

## 2022-10-08 DIAGNOSIS — M1711 Unilateral primary osteoarthritis, right knee: Secondary | ICD-10-CM | POA: Diagnosis present

## 2022-10-08 DIAGNOSIS — J449 Chronic obstructive pulmonary disease, unspecified: Secondary | ICD-10-CM | POA: Diagnosis present

## 2022-10-08 DIAGNOSIS — Z803 Family history of malignant neoplasm of breast: Secondary | ICD-10-CM

## 2022-10-08 DIAGNOSIS — Z6826 Body mass index (BMI) 26.0-26.9, adult: Secondary | ICD-10-CM

## 2022-10-08 DIAGNOSIS — I2510 Atherosclerotic heart disease of native coronary artery without angina pectoris: Secondary | ICD-10-CM | POA: Diagnosis present

## 2022-10-08 DIAGNOSIS — J9611 Chronic respiratory failure with hypoxia: Secondary | ICD-10-CM

## 2022-10-08 DIAGNOSIS — E119 Type 2 diabetes mellitus without complications: Secondary | ICD-10-CM | POA: Diagnosis present

## 2022-10-08 DIAGNOSIS — G20A2 Parkinson's disease without dyskinesia, with fluctuations: Secondary | ICD-10-CM

## 2022-10-08 DIAGNOSIS — Z9981 Dependence on supplemental oxygen: Secondary | ICD-10-CM

## 2022-10-08 DIAGNOSIS — Z961 Presence of intraocular lens: Secondary | ICD-10-CM | POA: Diagnosis present

## 2022-10-08 DIAGNOSIS — E039 Hypothyroidism, unspecified: Secondary | ICD-10-CM | POA: Diagnosis present

## 2022-10-08 DIAGNOSIS — M7121 Synovial cyst of popliteal space [Baker], right knee: Secondary | ICD-10-CM | POA: Diagnosis not present

## 2022-10-08 DIAGNOSIS — M25561 Pain in right knee: Secondary | ICD-10-CM | POA: Diagnosis present

## 2022-10-08 DIAGNOSIS — G8929 Other chronic pain: Secondary | ICD-10-CM | POA: Diagnosis present

## 2022-10-08 DIAGNOSIS — Z9842 Cataract extraction status, left eye: Secondary | ICD-10-CM

## 2022-10-08 DIAGNOSIS — Z7982 Long term (current) use of aspirin: Secondary | ICD-10-CM

## 2022-10-08 DIAGNOSIS — Z7984 Long term (current) use of oral hypoglycemic drugs: Secondary | ICD-10-CM

## 2022-10-08 DIAGNOSIS — E1142 Type 2 diabetes mellitus with diabetic polyneuropathy: Secondary | ICD-10-CM

## 2022-10-08 DIAGNOSIS — G20A1 Parkinson's disease without dyskinesia, without mention of fluctuations: Secondary | ICD-10-CM | POA: Diagnosis present

## 2022-10-08 DIAGNOSIS — Z79899 Other long term (current) drug therapy: Secondary | ICD-10-CM

## 2022-10-08 LAB — BASIC METABOLIC PANEL
Anion gap: 8 (ref 5–15)
BUN: 20 mg/dL (ref 8–23)
CO2: 26 mmol/L (ref 22–32)
Calcium: 9 mg/dL (ref 8.9–10.3)
Chloride: 105 mmol/L (ref 98–111)
Creatinine, Ser: 0.66 mg/dL (ref 0.44–1.00)
GFR, Estimated: >60 mL/min (ref >60)
Glucose, Bld: 105 mg/dL — ABNORMAL HIGH (ref 70–99)
Potassium: 3.8 mmol/L (ref 3.5–5.1)
Sodium: 139 mmol/L (ref 135–145)

## 2022-10-08 LAB — CBC WITH DIFFERENTIAL/PLATELET
Abs Immature Granulocytes: 0.01 10*3/uL (ref 0.00–0.07)
Basophils Absolute: 0.1 10*3/uL (ref 0.0–0.1)
Basophils Relative: 1 %
Eosinophils Absolute: 0.2 10*3/uL (ref 0.0–0.5)
Eosinophils Relative: 3 %
HCT: 43.3 % (ref 36.0–46.0)
Hemoglobin: 13.6 g/dL (ref 12.0–15.0)
Immature Granulocytes: 0 %
Lymphocytes Relative: 26 %
Lymphs Abs: 1.7 10*3/uL (ref 0.7–4.0)
MCH: 30 pg (ref 26.0–34.0)
MCHC: 31.4 g/dL (ref 30.0–36.0)
MCV: 95.6 fL (ref 80.0–100.0)
Monocytes Absolute: 0.8 10*3/uL (ref 0.1–1.0)
Monocytes Relative: 12 %
Neutro Abs: 3.9 10*3/uL (ref 1.7–7.7)
Neutrophils Relative %: 58 %
Platelets: 252 10*3/uL (ref 150–400)
RBC: 4.53 MIL/uL (ref 3.87–5.11)
RDW: 15.9 % — ABNORMAL HIGH (ref 11.5–15.5)
WBC: 6.7 10*3/uL (ref 4.0–10.5)
nRBC: 0 % (ref 0.0–0.2)

## 2022-10-08 MED ORDER — OXYCODONE-ACETAMINOPHEN 5-325 MG PO TABS
1.0000 | ORAL_TABLET | Freq: Four times a day (QID) | ORAL | Status: DC | PRN
  Administered 2022-10-08 – 2022-10-11 (×7): 1 via ORAL
  Filled 2022-10-08 (×7): qty 1

## 2022-10-08 MED ORDER — THIAMINE MONONITRATE 100 MG PO TABS
100.0000 mg | ORAL_TABLET | Freq: Every day | ORAL | Status: DC
  Administered 2022-10-08 – 2022-10-11 (×4): 100 mg via ORAL
  Filled 2022-10-08 (×4): qty 1

## 2022-10-08 MED ORDER — PRAMIPEXOLE DIHYDROCHLORIDE 0.25 MG PO TABS
0.1250 mg | ORAL_TABLET | Freq: Four times a day (QID) | ORAL | Status: DC
  Administered 2022-10-08 – 2022-10-11 (×11): 0.125 mg via ORAL
  Filled 2022-10-08 (×14): qty 0.5

## 2022-10-08 MED ORDER — ACETAMINOPHEN 325 MG PO TABS: 650.0000 mg | ORAL_TABLET | Freq: Four times a day (QID) | ORAL | Status: DC

## 2022-10-08 MED ORDER — FOLIC ACID 1 MG PO TABS
1.0000 mg | ORAL_TABLET | Freq: Every day | ORAL | Status: DC
  Administered 2022-10-08 – 2022-10-11 (×4): 1 mg via ORAL
  Filled 2022-10-08 (×4): qty 1

## 2022-10-08 MED ORDER — CARBIDOPA-LEVODOPA ER 50-200 MG PO TBCR
1.0000 | EXTENDED_RELEASE_TABLET | Freq: Every day | ORAL | Status: DC
  Administered 2022-10-08 – 2022-10-10 (×3): 1 via ORAL
  Filled 2022-10-08 (×4): qty 1

## 2022-10-08 MED ORDER — ACETAMINOPHEN 500 MG PO TABS
1000.0000 mg | ORAL_TABLET | Freq: Once | ORAL | Status: AC
  Administered 2022-10-08: 1000 mg via ORAL
  Filled 2022-10-08: qty 2

## 2022-10-08 MED ORDER — SERTRALINE HCL 50 MG PO TABS
25.0000 mg | ORAL_TABLET | Freq: Every day | ORAL | Status: DC
  Administered 2022-10-09 – 2022-10-11 (×3): 25 mg via ORAL
  Filled 2022-10-08 (×3): qty 1

## 2022-10-08 MED ORDER — INSULIN ASPART 100 UNIT/ML IJ SOLN: 0.0000 [IU] | Freq: Three times a day (TID) | INTRAMUSCULAR | Status: DC

## 2022-10-08 MED ORDER — ENTACAPONE 200 MG PO TABS
100.0000 mg | ORAL_TABLET | Freq: Three times a day (TID) | ORAL | Status: DC
  Administered 2022-10-08 – 2022-10-11 (×9): 100 mg via ORAL
  Filled 2022-10-08 (×10): qty 0.5

## 2022-10-08 MED ORDER — FUROSEMIDE 10 MG/ML IJ SOLN
60.0000 mg | Freq: Once | INTRAMUSCULAR | Status: AC
  Administered 2022-10-08: 60 mg via INTRAVENOUS
  Filled 2022-10-08: qty 8

## 2022-10-08 MED ORDER — ONDANSETRON HCL 4 MG PO TABS: 4.0000 mg | ORAL_TABLET | Freq: Four times a day (QID) | ORAL | Status: DC | PRN

## 2022-10-08 MED ORDER — GLUCOSE 40 % PO GEL: 1.0000 | ORAL | Status: DC | PRN

## 2022-10-08 MED ORDER — LEVOTHYROXINE SODIUM 50 MCG PO TABS
75.0000 ug | ORAL_TABLET | Freq: Every day | ORAL | Status: DC
  Administered 2022-10-09 – 2022-10-11 (×3): 75 ug via ORAL
  Filled 2022-10-08 (×3): qty 1

## 2022-10-08 MED ORDER — TORSEMIDE 20 MG PO TABS: 40.0000 mg | ORAL_TABLET | Freq: Every day | ORAL | Status: DC | PRN

## 2022-10-08 MED ORDER — BUSPIRONE HCL 10 MG PO TABS
10.0000 mg | ORAL_TABLET | Freq: Two times a day (BID) | ORAL | Status: DC
  Administered 2022-10-08 – 2022-10-11 (×6): 10 mg via ORAL
  Filled 2022-10-08: qty 2
  Filled 2022-10-08 (×2): qty 1
  Filled 2022-10-08: qty 2
  Filled 2022-10-08 (×2): qty 1

## 2022-10-08 MED ORDER — ACETAMINOPHEN 650 MG RE SUPP: 650.0000 mg | Freq: Four times a day (QID) | RECTAL | Status: DC | PRN

## 2022-10-08 MED ORDER — ASPIRIN 81 MG PO TBEC
81.0000 mg | DELAYED_RELEASE_TABLET | Freq: Every day | ORAL | Status: DC
  Administered 2022-10-09 – 2022-10-11 (×3): 81 mg via ORAL
  Filled 2022-10-08 (×3): qty 1

## 2022-10-08 MED ORDER — ADULT MULTIVITAMIN W/MINERALS CH
1.0000 | ORAL_TABLET | Freq: Every day | ORAL | Status: DC
  Administered 2022-10-09 – 2022-10-11 (×3): 1 via ORAL
  Filled 2022-10-08 (×3): qty 1

## 2022-10-08 MED ORDER — QUETIAPINE FUMARATE 25 MG PO TABS
50.0000 mg | ORAL_TABLET | Freq: Every day | ORAL | Status: DC
  Administered 2022-10-08 – 2022-10-10 (×3): 50 mg via ORAL
  Filled 2022-10-08 (×4): qty 2

## 2022-10-08 MED ORDER — PRAVASTATIN SODIUM 20 MG PO TABS
40.0000 mg | ORAL_TABLET | Freq: Every day | ORAL | Status: DC
  Administered 2022-10-09 – 2022-10-11 (×3): 40 mg via ORAL
  Filled 2022-10-08 (×3): qty 2

## 2022-10-08 MED ORDER — METOPROLOL SUCCINATE ER 50 MG PO TB24
50.0000 mg | ORAL_TABLET | Freq: Every day | ORAL | Status: DC
  Administered 2022-10-09 – 2022-10-11 (×3): 50 mg via ORAL
  Filled 2022-10-08 (×3): qty 1

## 2022-10-08 MED ORDER — ENOXAPARIN SODIUM 40 MG/0.4ML IJ SOSY
40.0000 mg | PREFILLED_SYRINGE | INTRAMUSCULAR | Status: DC
  Administered 2022-10-08 – 2022-10-10 (×3): 40 mg via SUBCUTANEOUS
  Filled 2022-10-08 (×3): qty 0.4

## 2022-10-08 MED ORDER — GABAPENTIN 300 MG PO CAPS
300.0000 mg | ORAL_CAPSULE | Freq: Every day | ORAL | Status: DC
  Administered 2022-10-08 – 2022-10-10 (×3): 300 mg via ORAL
  Filled 2022-10-08 (×3): qty 1

## 2022-10-08 MED ORDER — CARBIDOPA-LEVODOPA 25-100 MG PO TABS
2.5000 | ORAL_TABLET | Freq: Four times a day (QID) | ORAL | Status: DC
  Administered 2022-10-08 – 2022-10-11 (×12): 2.5 via ORAL
  Filled 2022-10-08 (×4): qty 3
  Filled 2022-10-08: qty 2.5
  Filled 2022-10-08: qty 3
  Filled 2022-10-08 (×2): qty 2.5
  Filled 2022-10-08 (×2): qty 3
  Filled 2022-10-08 (×2): qty 2.5
  Filled 2022-10-08: qty 3
  Filled 2022-10-08 (×2): qty 2.5

## 2022-10-08 MED ORDER — HYDROMORPHONE HCL 1 MG/ML IJ SOLN: 0.5000 mg | INTRAMUSCULAR | Status: DC | PRN

## 2022-10-08 MED ORDER — DICLOFENAC SODIUM 1 % EX GEL
4.0000 g | Freq: Four times a day (QID) | CUTANEOUS | Status: DC
  Administered 2022-10-09 – 2022-10-11 (×10): 4 g via TOPICAL
  Filled 2022-10-08 (×2): qty 100

## 2022-10-08 MED ORDER — IBUPROFEN 600 MG PO TABS
600.0000 mg | ORAL_TABLET | Freq: Once | ORAL | Status: AC
  Administered 2022-10-08: 600 mg via ORAL
  Filled 2022-10-08: qty 1

## 2022-10-08 MED ORDER — ALLOPURINOL 100 MG PO TABS
100.0000 mg | ORAL_TABLET | Freq: Every day | ORAL | Status: DC
  Administered 2022-10-09 – 2022-10-11 (×3): 100 mg via ORAL
  Filled 2022-10-08 (×3): qty 1

## 2022-10-08 MED ORDER — POLYETHYLENE GLYCOL 3350 17 G PO PACK: 17.0000 g | PACK | Freq: Every day | ORAL | Status: DC | PRN

## 2022-10-08 MED ORDER — ONDANSETRON HCL 4 MG/2ML IJ SOLN: 4.0000 mg | Freq: Four times a day (QID) | INTRAMUSCULAR | Status: DC | PRN

## 2022-10-08 MED ORDER — ACETAMINOPHEN 325 MG PO TABS: 650.0000 mg | ORAL_TABLET | Freq: Four times a day (QID) | ORAL | Status: DC | PRN

## 2022-10-08 NOTE — Assessment & Plan Note (Addendum)
Secondary to HFrEF. Patient oxygenating well on room air, however endorsing mild SOB. Examination and CXR reassuring.   - Restart home supplemental oxygen

## 2022-10-08 NOTE — ED Triage Notes (Signed)
Pt here with reports that she began having rt leg pain/swelling x 2 days. Sent for possible DVT by PCP.

## 2022-10-08 NOTE — ED Provider Notes (Addendum)
[facility] Provider Note    Event Date/Time   First MD Initiated Contact with Patient 10/08/22 1619     (approximate)   History   Leg Pain   HPI  Stephanie [name] is a 76 y.o. female   Past medical history of anemia, CHF, chronic back pain, diabetes, GERD, hypertension hyperlipidemia, hypothyroid, Parkinson's who presents with right leg pain atraumatic and some swelling noted.  Pain started approximately 3 days ago.  She has no other associated symptoms.  She is walking with a walker but has pain in the back of the right knee when doing so.  History was obtained via patient.      Physical Exam   Triage Vital Signs: ED Triage Vitals  Enc Vitals Group     BP 10/08/22 1233 (!) 148/61     Pulse Rate 10/08/22 1233 72     Resp 10/08/22 1233 16     Temp 10/08/22 1233 98.2 F (36.8 C)     Temp Source 10/08/22 1233 Oral     SpO2 10/08/22 1233 100 %     Weight 10/08/22 1241 136 lb 11 oz (62 kg)     Height 10/08/22 1241 5' (1.524 m)     Head Circumference --      Peak Flow --      Pain Score 10/08/22 1241 10     Pain Loc --      Pain Edu? --      Excl. in [place]? --     Most recent vital signs: Vitals:   10/08/22 1625 10/08/22 1635  BP:  (!) 172/92  Pulse:  67  Resp:  19  Temp: 97.6 F (36.4 C)   SpO2:  96%    General: Awake, no distress.  CV:  Good peripheral perfusion.  Resp:  Normal effort.  Abd:  No distention.  Other:  No infectious changes noted to the affected right lower extremity, sensation intact and brisk cap refill neurovascular intact distally.  She has tenderness to palpation to the popliteal area.   ED Results / Procedures / Treatments   Labs (all labs ordered are listed, but only abnormal results are displayed) Labs Reviewed - No data to display    PROCEDURES:  Critical Care performed: No  Procedures   MEDICATIONS ORDERED IN ED: Medications  acetaminophen (TYLENOL) tablet 1,000 mg (has no administration in  time range)  ibuprofen (ADVIL) tablet 600 mg (has no administration in time range)   IMPRESSION / MDM / ASSESSMENT AND PLAN / ED COURSE  I reviewed the triage vital signs and the nursing notes.                              Differential diagnosis includes, but is not limited to, popliteal cyst, DVT, ischemic leg, hematoma, trauma infection    MDM: Patient with atraumatic popliteal pain with a Baker's cyst on ultrasound and no other emergent findings or suspicions on my exam.  She continues to be ambulatory with her walker.  Will trial oral pain medications over the next several days and a referral out to orthopedics if pain is not improving to address cyst with aspiration or potential injection.   I spoke with the patient's [name] who expresses concern about her mother's inability to ambulate and bear weight due to the pain behind her knee over the last several days.  She feels that his home is not a  safe place for her, was prior able to ambulate with walker and perform ADLs.  No longer able to do so due to pain.  She will be admitted for further treatment, PT evaluation and potential placement if pain is not well-controlled and ambulatory status remains poor.  Spoke with Dr. Roland Rack of orthopedics determine if there is any procedure I can perform in the emergency department to help alleviate her pain to facilitate discharge, however the cyst is best aspirated under ultrasound guidance and is high risk due to the popliteal vessels to be done blind in the emergency department at this time.  Could arrange for outpatient in his clinic with a assistant who specializes in the area but unfortunately cannot perform this evening or while in the hospital.  Alternatively can coordinate with IR while inpatient.  Popliteal cyst and pain more likely from underlying arthritis and inflammation in the knee.   Needs admission for pain control, PT evaluation and potential placement.  Patient's presentation  is most consistent with acute presentation with potential threat to life or bodily function.       FINAL CLINICAL IMPRESSION(S) / ED DIAGNOSES   Final diagnoses:  Synovial cyst of right popliteal space     Rx / DC Orders   ED Discharge Orders     None        Note:  This document was prepared using Dragon voice recognition software and may include unintentional dictation errors.    Lucillie Garfinkel, MD 10/08/22 1653    Lucillie Garfinkel, MD 10/08/22 1715    Lucillie Garfinkel, MD 10/08/22 [phone]

## 2022-10-08 NOTE — Assessment & Plan Note (Signed)
-   Hold home medications - A1c pending - SSI, moderate

## 2022-10-08 NOTE — Assessment & Plan Note (Signed)
Patient presenting with approximately 3-day history of right knee pain with gradual worsening leading to inability to bear weight.  Dopplers negative for DVT, however large Baker's cyst noted.  On examination, the knee is edematous with a palpable effusion with increased warmth.  Primary differential is osteoarthritis with exacerbation. Lower on the differential is gout. Low suspicion for septic joint as knee is non-erythematous and patient is afebrile with no leukocytosis.   Discussed with orthopedic surgery and requested arthrocentesis of the right knee effusion with possible steroid injection. I suspect Baker's cyst will improve with treatment of knee effusion and underlying arthritis.   - Orthopedic surgery consulted; appreciate their recommendations - Scheduled Tylenol - Continue home PRN Percocet - Dilaudid for breakthrough pain - Avoid oral NSAIDS given cardiovascular disease.  - Topical Voltaren gel QID - PT/OT

## 2022-10-08 NOTE — Assessment & Plan Note (Signed)
Per chart review, significant weight loss over the past 18 months, approximately 60 lbs. I did not address this with Stephanie [name] today.   - Recommend PCP follow with age-appropriate cancer screening

## 2022-10-08 NOTE — Assessment & Plan Note (Signed)
Examination notable for S3 gallop and +1 pitting edema bilaterally. Patient has missed several days of her home diuretic. CXR appears improved compared to prior and lung examination is clear.   - Lasix 60 mg IV one time - Restart home Torsemide tomorrow - Continue home Metoprolol - Daily weights - Will check BNP

## 2022-10-08 NOTE — H&P (Signed)
History and Physical    Patient: Stephanie [name] XBJ:[id number] DOB: [birth year] DOA: 10/08/2022 DOS: the patient was seen and examined on 10/08/2022 PCP: [name], MD  Patient coming from: Home  Chief Complaint:  Chief Complaint  Patient presents with   Leg Pain   HPI: Stephanie [name] is a 76 y.o. female with medical history significant of HFrEF, chronic hypoxic respiratory failure on 2 L, Parkinson's, type 2 diabetes, hypertension, hypothyroidism, who presents to the ED with complaints of right knee pain.  Stephanie [name] states that for the past 2 to 3 days, she has been experiencing right knee pain that has gradually worsened.  For the last 24 hours, the pain has been so severe that has made ambulating difficult.  She has never experienced pain similar to this in the past.  She is unsure if she has a history of arthritis.  She denies any fever, chills, nausea, vomiting, diarrhea.  She endorses shortness of breath that she states has been gradually worsening for weeks.  She believes it is slightly worse today as she has not taken her fluid pills since her knee began to hurt as she was worried she would not make it to the restroom in time.  In addition, she has not been wearing her home oxygen.  In addition, she has missed her dose of carbidopa-levodopa for the last 24 hours as she had difficulty walking.  She denies any orthopnea, chest pain, palpitations.  She endorses lower extremity edema.  ED course: On arrival to the ED, patient was afebrile at 97.6 and hypertensive at 172/92.  Heart rate was 64.  She was saturating at 96% on room air.  Initial workup with normal WBC, electrolytes and renal function. Right lower extremity Doppler negative for DVT but notable for Baker's cyst measuring 4.3 x 3.1 x 1.1 centimeters.  Right knee x-ray was obtained that demonstrated tricompartmental spurring and mild loss of the articular space in the patellofemoral joint, small knee effusion and  subcutaneous edema.  Due to inability to ambulate, [facility] contacted for admission.  Review of Systems: As mentioned in the history of present illness. All other systems reviewed and are negative.  Past Medical History:  Diagnosis Date   Anemia    Arthritis    CHF (congestive heart failure) (HCC)    Chronic back pain    Diabetes mellitus without complication (HCC)    Dysrhythmia    GERD (gastroesophageal reflux disease)    Hyperlipidemia    Hypertension    Hypothyroid    Multilevel degenerative disc disease    Parkinson's disease ([place])    Sleep apnea    Syncope    Tremors of nervous system    Wears dentures    full upper and lower   Past Surgical History:  Procedure Laterality Date   ABDOMINAL HYSTERECTOMY     BACK SURGERY  09/16   CARDIAC CATHETERIZATION     CATARACT EXTRACTION W/PHACO Right 11/24/2015   Procedure: CATARACT EXTRACTION PHACO AND INTRAOCULAR LENS PLACEMENT ([place]);  Surgeon: Estill Cotta, MD;  Location: [facility];  Service: Ophthalmology;  Laterality: Right;  [place] 01:14 AP% 25.5 CDE 30.69 fluid pack lot # [id number]   CATARACT EXTRACTION W/PHACO Left 01/06/2021   Procedure: CATARACT EXTRACTION PHACO AND INTRAOCULAR LENS PLACEMENT ([facility]) LEFT DIABETIC 6.98 00:40.1;  Surgeon: Birder Robson, MD;  Location: [place];  Service: Ophthalmology;  Laterality: Left;  Diabetic - oral meds   COLONOSCOPY     COLONOSCOPY WITH PROPOFOL N/A  07/24/2019   Procedure: COLONOSCOPY WITH PROPOFOL;  Surgeon: Toledo, Benay Pike, MD;  Location: [facility];  Service: Gastroenterology;  Laterality: N/A;   ESOPHAGOGASTRODUODENOSCOPY N/A 07/24/2019   Procedure: ESOPHAGOGASTRODUODENOSCOPY ([facility]);  Surgeon: Toledo, Benay Pike, MD;  Location: [facility];  Service: Gastroenterology;  Laterality: N/A;   OOPHORECTOMY     Social History:  reports that she has never smoked. She has never used smokeless tobacco. She reports that she does not currently use alcohol. She reports that she  does not use drugs.  No Known Allergies  Family History  Problem Relation Age of Onset   Breast cancer Paternal Aunt     Prior to Admission medications   Medication Sig Start Date End Date Taking? Authorizing Provider  allopurinol (ZYLOPRIM) 100 MG tablet Take 100 mg by mouth daily.    [provider]  aspirin EC 81 MG tablet Take 1 tablet (81 mg total) by mouth daily. 02/16/22   Thurnell Lose, MD  carbidopa-levodopa (SINEMET CR) 50-200 MG tablet Take 1 tablet by mouth at bedtime.    [provider]  carbidopa-levodopa (SINEMET IR) 25-100 MG tablet Take 2.5 tablets by mouth 4 (four) times daily.    [provider]  Continuous Blood Gluc Sensor (FREESTYLE LIBRE 14 DAY SENSOR) MISC by Does not apply route.    [provider]  entacapone (COMTAN) 200 MG tablet Take 100 mg by mouth 3 (three) times daily.    [provider]  fluticasone (FLONASE) 50 MCG/ACT nasal spray Place 1-2 sprays into the nose daily as needed for allergies. 09/29/15   [provider]  gabapentin (NEURONTIN) 300 MG capsule Take 300 mg by mouth at bedtime.    [provider]  glucose 4 GM chewable tablet Chew 1 tablet by mouth as needed for low blood sugar.    [provider]  levothyroxine (SYNTHROID) 75 MCG tablet Take 75 mcg by mouth daily. 07/20/21 07/20/22  [provider]  losartan (COZAAR) 100 MG tablet Take 1 tablet (100 mg total) by mouth daily. 01/07/22 04/07/22  Alisa Graff, FNP  metFORMIN (GLUCOPHAGE) 1000 MG tablet Take 1,000 mg by mouth 2 (two) times daily with a meal.    [provider]  metoprolol succinate (TOPROL-XL) 50 MG 24 hr tablet Take 50 mg by mouth daily. Take with or immediately following a meal.    [provider]  Multiple Vitamin (MULTIVITAMIN WITH MINERALS) TABS tablet Take 1 tablet by mouth daily.    [provider]  oxyCODONE-acetaminophen (PERCOCET/ROXICET) 5-325 MG tablet Take 1 tablet  by mouth every 6 (six) hours as needed for severe pain. 02/16/22   Thurnell Lose, MD  pramipexole (MIRAPEX) 0.125 MG tablet Take 0.125 mg by mouth 4 (four) times daily.    [provider]  pravastatin (PRAVACHOL) 40 MG tablet Take 40 mg by mouth daily.    [provider]  QUEtiapine (SEROQUEL) 50 MG tablet Take 50 mg by mouth at bedtime. 02/03/22   [provider]  sertraline (ZOLOFT) 25 MG tablet Take 1 tablet (25 mg total) by mouth daily. 09/08/21   Donne Hazel, MD  torsemide (DEMADEX) 20 MG tablet Take 40 mg by mouth daily as needed (fluid). 10/06/21   [provider]  glimepiride (AMARYL) 4 MG tablet Take 4 mg by mouth 2 (two) times daily. Pt. Takes 1 tablet in the morning and .5 tablet at night.  12/05/20  [provider]    Physical Exam: Vitals:  10/08/22 1622 10/08/22 1625 10/08/22 1635 10/08/22 2042  BP: (!) 162/102  (!) 172/92   Pulse: 73  67 70  Resp: '19  19 18  '$ Temp:  97.6 F (36.4 C)  97.7 F (36.5 C)  TempSrc:  Oral    SpO2: 93%  96% 97%  Weight:      Height:       Physical Exam Vitals and nursing note reviewed.  Constitutional:      General: She is not in acute distress.    Appearance: She is normal weight. She is not toxic-appearing.  HENT:     Head: Normocephalic and atraumatic.     Mouth/Throat:     Mouth: Mucous membranes are moist.     Pharynx: Oropharynx is clear.  Eyes:     Conjunctiva/sclera: Conjunctivae normal.     Pupils: Pupils are equal, round, and reactive to light.  Cardiovascular:     Rate and Rhythm: Normal rate and regular rhythm.     Heart sounds: No murmur heard.    Gallop (S3) present.  Pulmonary:     Effort: Pulmonary effort is normal. No respiratory distress.     Breath sounds: Normal breath sounds. No wheezing, rhonchi or rales.  Abdominal:     General: Bowel sounds are normal. There is no distension.     Palpations: Abdomen is soft.     Tenderness: There is no abdominal tenderness.  There is no guarding.  Musculoskeletal:     Cervical back: Neck supple.     Right knee: Swelling (Right knee with notable swelling compared to the left.), effusion and crepitus present. No erythema or bony tenderness. Decreased range of motion (Secondary to pain). Tenderness (Mild tenderness to palpation along the medial and lateral joint lines, however majority of pain is low gaited in the posterior inferior aspect following Baker's cyst.) present.     Left knee: Normal.     Right lower leg: 1+ Pitting Edema present.     Left lower leg: 1+ Pitting Edema present.  Skin:    General: Skin is warm and dry.  Neurological:     Mental Status: She is alert and oriented to person, place, and time. Mental status is at baseline.     Comments: Cranial nerves II through XII intact.  No dysarthria.  Bilateral upper and lower extremity strength 5/5, although right lower extremity strength limited by pain.  Sensation intact throughout.  Cogwheel rigidity noted.  Psychiatric:        Mood and Affect: Mood normal.        Behavior: Behavior normal.        Thought Content: Thought content normal.        Judgment: Judgment normal.    Data Reviewed: CBC with WBC of 6.7, hemoglobin of 13.6, and platelets of 252.  BMP with normal electrolytes, glucose of 105, creatinine of 0.66 and anion gap of 8.  DG Chest 1 View  Result Date: 10/08/2022 CLINICAL DATA:  Shortness of breath EXAM: CHEST  1 VIEW COMPARISON:  02/13/2022, CT 07/22/2021, chest x-ray 08/31/2021, 07/06/2021 FINDINGS: Cardiomegaly. Underlying chronic interstitial opacities. Mild diffuse increased interstitial densities. Atelectasis or scarring at the mid to lower lungs. No pleural effusion or pneumothorax. IMPRESSION: Cardiomegaly with mild diffuse increased interstitial densities which may be due to mild edema or inflammatory process superimposed on underlying chronic disease. Electronically Signed   By: Donavan Foil M.D.   On: 10/08/2022 21:28   DG  Knee 2 Views Right  Result Date:  10/08/2022 CLINICAL DATA:  Right knee pain and swelling for 2 days EXAM: RIGHT KNEE - 1-2 VIEW COMPARISON:  None Available. FINDINGS: Tricompartmental spurring loss of articular space in the patellofemoral joint. Suspected small knee effusion although the suprapatellar bursa is ill-defined. Faint linear calcifications along the menisci may reflect subtle chondrocalcinosis. Subcutaneous edema noted in the distal thigh, knee region, calf. Chronically fragmented distal quadriceps osteophyte along the patella. IMPRESSION: 1. Tricompartmental spurring and mild loss of articular space in the patellofemoral joint. 2. Suspected small knee effusion. 3. Subcutaneous edema in the distal thigh, knee region, and calf. 4. Chronically fragmented distal quadriceps osteophyte along the patella. Electronically Signed   By: Van Clines M.D.   On: 10/08/2022 17:57   US Venous Img Lower Unilateral Right  Result Date: 10/08/2022 CLINICAL DATA:  Right lower extremity pain and swelling EXAM: RIGHT LOWER EXTREMITY VENOUS DOPPLER ULTRASOUND TECHNIQUE: Gray-scale sonography with compression, as well as color and duplex ultrasound, were performed to evaluate the deep venous system(s) from the level of the common femoral vein through the popliteal and proximal calf veins. COMPARISON:  07/21/2021 FINDINGS: VENOUS Normal compressibility of the common femoral, superficial femoral, and popliteal veins, as well as the visualized calf veins, although the peroneal vein could not be visualized. Visualized portions of profunda femoral vein and great saphenous vein unremarkable. No filling defects to suggest DVT on grayscale or color Doppler imaging. Doppler waveforms show normal direction of venous flow, normal respiratory plasticity and response to augmentation. Limited views of the contralateral common femoral vein are unremarkable. OTHER Baker cyst in the right popliteal fossa, measuring 4.3 x 3.1 x 1.1  cm Limitations: Peroneal vein could not be visualized. IMPRESSION: 1. No evidence of deep venous thrombosis in the right lower extremity, although the right peroneal vein could not be visualized RO but. 2. 4.3 x 3.1 x 1.1 cm Baker cyst in the right popliteal fossa. Negative. Electronically Signed   By: Merilyn Baba M.D.   On: 10/08/2022 14:12    Results are pending, will review when available.  Assessment and Plan: * Effusion of right knee Patient presenting with approximately 3-day history of right knee pain with gradual worsening leading to inability to bear weight.  Dopplers negative for DVT, however large Baker's cyst noted.  On examination, the knee is edematous with a palpable effusion with increased warmth.  Primary differential is osteoarthritis with exacerbation. Lower on the differential is gout. Low suspicion for septic joint as knee is non-erythematous and patient is afebrile with no leukocytosis.   Discussed with orthopedic surgery and requested arthrocentesis of the right knee effusion with possible steroid injection. I suspect Baker's cyst will improve with treatment of knee effusion and underlying arthritis.   - Orthopedic surgery consulted; appreciate their recommendations - Scheduled Tylenol - Continue home PRN Percocet - Dilaudid for breakthrough pain - Avoid oral NSAIDS given cardiovascular disease.  - Topical Voltaren gel QID - PT/OT  HFrEF (heart failure with reduced ejection fraction) (HCC) Examination notable for S3 gallop and +1 pitting edema bilaterally. Patient has missed several days of her home diuretic. CXR appears improved compared to prior and lung examination is clear.   - Lasix 60 mg IV one time - Restart home Torsemide tomorrow - Continue home Metoprolol - Daily weights - Will check BNP  Parkinson's disease - Restart home medications, including Carbidopa-Levodopa, Entacapone, and Pramipexole   Type 2 diabetes mellitus ([place]) - Hold home medications -  A1c pending - SSI, moderate  Chronic hypoxic respiratory  failure ([place]) Secondary to HFrEF. Patient oxygenating well on room air, however endorsing mild SOB. Examination and CXR reassuring.   - Restart home supplemental oxygen  Weight loss Per chart review, significant weight loss over the past 18 months, approximately 60 lbs. I did not address this with [place] today.   - Recommend PCP follow with age-appropriate cancer screening  Advance Care Planning:   Code Status: Full Code   Consults: Orthopedic Surgery  Family Communication: No family at bedside  Severity of Illness: The appropriate patient status for this patient is OBSERVATION. Observation status is judged to be reasonable and necessary in order to provide the required intensity of service to ensure the patient's safety. The patient's presenting symptoms, physical exam findings, and initial radiographic and laboratory data in the context of their medical condition is felt to place them at decreased risk for further clinical deterioration. Furthermore, it is anticipated that the patient will be medically stable for discharge from the hospital within 2 midnights of admission.   Author: Jose Persia, MD 10/08/2022 11:11 PM  For on call review [email or web address].

## 2022-10-08 NOTE — Discharge Instructions (Addendum)
Call Dr Roland Rack of orthopedic clinic if the pain continues next week despite treatment with medications to discuss an injection of steroids into the cyst to help with pain.  Take acetaminophen 650 mg and ibuprofen 400 mg every 6 hours for pain.  Take with food for the next 3-4 days.   Thank you for choosing [place] for your health care today!  Please see your primary doctor this week for a follow up appointment.   Sometimes, in the early stages of certain disease courses it is difficult to detect in the emergency department evaluation -- so, it is important that you continue to monitor your symptoms and call your doctor right away or return to the emergency department if you develop any new or worsening symptoms.  Please go to the following website to schedule new (and existing) patient appointments:   [email or web address]  If you do not have a primary doctor try calling the following clinics to establish care:  If you have insurance:  [facility] [phone] [place] [address]   [facility]  [phone] [address] [place] [address]   If you do not have insurance:  [facility]  [phone] [address], [place] [address]   The following is another list of primary care offices in the area who are accepting new patients at this time.  Please reach out to one of them directly and let them know you would like to schedule an appointment to follow up on an Emergency Department visit, and/or to establish a new primary care provider (PCP).  There are likely other primary care clinics in the are who are accepting new patients, but this is an excellent place to start:  [place] physician: Dr Lavon Paganini [address] [place], [place] [address] [phone]  [facility] Lead Physician: Dr Steele Sizer [address],  [place], [place] [address] [phone]  [place] Physician: Dr Park Liter [address] [place], [place] [address] [phone]  [facility] Lead Physician: Dr Dewaine Oats [place], [place], [place] [address] [phone]  [facility] Physician: Dr Halina Maidens [address] [place], [place] [address] [phone]   It was my pleasure to care for you today.   [place] Brunette Jacelyn Grip, MD

## 2022-10-08 NOTE — Assessment & Plan Note (Signed)
-   Restart home medications, including Carbidopa-Levodopa, Entacapone, and Pramipexole

## 2022-10-08 NOTE — ED Notes (Signed)
Pt given fan and repositioned per her request.

## 2022-10-09 ENCOUNTER — Other Ambulatory Visit: Payer: Self-pay

## 2022-10-09 DIAGNOSIS — M25461 Effusion, right knee: Secondary | ICD-10-CM | POA: Diagnosis not present

## 2022-10-09 LAB — CBC
HCT: 35.6 % — ABNORMAL LOW (ref 36.0–46.0)
Hemoglobin: 11.4 g/dL — ABNORMAL LOW (ref 12.0–15.0)
MCH: 30.5 pg (ref 26.0–34.0)
MCHC: 32 g/dL (ref 30.0–36.0)
MCV: 95.2 fL (ref 80.0–100.0)
Platelets: 243 10*3/uL (ref 150–400)
RBC: 3.74 MIL/uL — ABNORMAL LOW (ref 3.87–5.11)
RDW: 15.1 % (ref 11.5–15.5)
WBC: 6.4 10*3/uL (ref 4.0–10.5)
nRBC: 0 % (ref 0.0–0.2)

## 2022-10-09 LAB — BASIC METABOLIC PANEL
Anion gap: 6 (ref 5–15)
BUN: 19 mg/dL (ref 8–23)
CO2: 30 mmol/L (ref 22–32)
Calcium: 9.1 mg/dL (ref 8.9–10.3)
Chloride: 104 mmol/L (ref 98–111)
Creatinine, Ser: 0.8 mg/dL (ref 0.44–1.00)
GFR, Estimated: >60 mL/min (ref >60)
Glucose, Bld: 89 mg/dL (ref 70–99)
Potassium: 3.9 mmol/L (ref 3.5–5.1)
Sodium: 140 mmol/L (ref 135–145)

## 2022-10-09 LAB — TSH: TSH: 5.291 u[IU]/mL — ABNORMAL HIGH (ref 0.350–4.500)

## 2022-10-09 LAB — GLUCOSE, CAPILLARY: Glucose-Capillary: 238 mg/dL — ABNORMAL HIGH (ref 70–99)

## 2022-10-09 LAB — BRAIN NATRIURETIC PEPTIDE: B Natriuretic Peptide: 734.9 pg/mL — ABNORMAL HIGH (ref 0.0–100.0)

## 2022-10-09 MED ORDER — HYDRALAZINE HCL 20 MG/ML IJ SOLN: 10.0000 mg | Freq: Four times a day (QID) | INTRAMUSCULAR | Status: DC | PRN

## 2022-10-09 MED ORDER — BUPIVACAINE HCL (PF) 0.5 % IJ SOLN
10.0000 mL | Freq: Once | INTRAMUSCULAR | Status: AC
  Administered 2022-10-09: 10 mL via INTRA_ARTICULAR
  Filled 2022-10-09: qty 30

## 2022-10-09 MED ORDER — ACETAMINOPHEN 500 MG PO TABS: 1000.0000 mg | ORAL_TABLET | Freq: Three times a day (TID) | ORAL | Status: DC | PRN

## 2022-10-09 MED ORDER — TRIAMCINOLONE ACETONIDE 40 MG/ML IJ SUSP
80.0000 mg | Freq: Once | INTRAMUSCULAR | Status: AC
  Administered 2022-10-09: 80 mg via INTRA_ARTICULAR
  Filled 2022-10-09: qty 2

## 2022-10-09 MED ORDER — METFORMIN HCL 500 MG PO TABS
1000.0000 mg | ORAL_TABLET | Freq: Two times a day (BID) | ORAL | Status: DC
  Administered 2022-10-09 – 2022-10-11 (×5): 1000 mg via ORAL
  Filled 2022-10-09 (×5): qty 2

## 2022-10-09 NOTE — Progress Notes (Addendum)
  PROGRESS NOTE    Stephanie [name]  IDP:[id number] DOB: December 04, 1945 DOA: 10/08/2022 PCP: [name], MD  134A/134A-AA  LOS: 0 days   Brief hospital course:   Assessment & Plan: Stephanie [name] is a 76 y.o. female with medical history significant of HFrEF, chronic hypoxic respiratory failure on 2 L, Parkinson's, type 2 diabetes, hypertension, hypothyroidism, who presents to the ED with complaints of right knee pain.    * Effusion of right knee with underlying degenerative joint disease  Patient presenting with approximately 3-day history of right knee pain with gradual worsening leading to inability to bear weight.  Dopplers negative for DVT, however large Baker's cyst noted.   --Ortho Dr. Roland Rack attempted right knee aspiration today, but no fluid return.  Kenalog injection into the knee was done. --PT/OT rec home with [facility]   Chronic HFrEF (heart failure with reduced ejection fraction) (HCC) --on home torsemide 40 mg daily PRN. CXR appears improved compared to prior and lung examination is clear.  - s/p Lasix 60 mg IV one time --cont Toprol   Parkinson's disease - cont home medications, including Carbidopa-Levodopa, Entacapone, and Pramipexole    Type 2 diabetes mellitus ([place]) --recent A1c 6.8, well controlled --d/c BG checks and SSI --resume home metformin   Chronic hypoxic respiratory failure ([place]) on 2L PRN Secondary to HFrEF. Patient oxygenating well on room air, however endorsing mild SOB. Examination and CXR reassuring.  --Continue supplemental O2 to keep sats >=92%, wean as tolerated  Weight loss Per chart review, significant weight loss over the past 18 months, approximately 60 lbs.   - Recommend PCP follow with age-appropriate cancer screening   DVT prophylaxis: Lovenox SQ Code Status: Full code  Family Communication:  Level of care: Med-Surg Dispo:   The patient is from: home Anticipated d/c is to: home Anticipated d/c date is:  tomorrow   Subjective and Interval History:  Ortho attempted right knee aspiration today, but no fluid return.  Kenalog injection into the knee was done.   Objective: Vitals:   10/09/22 1227 10/09/22 1330 10/09/22 1459 10/09/22 1655  BP: (!) 100/57 (!) 158/71 (!) 160/84 (!) 143/91  Pulse: 63 71 73 70  Resp: '18 13 17 16  '$ Temp: (!) 97.1 F (36.2 C)  97.7 F (36.5 C) 97.9 F (36.6 C)  TempSrc: Temporal     SpO2: 99% 98% 100% 97%  Weight:      Height:       No intake or output data in the 24 hours ending 10/09/22 1818 Filed Weights   10/08/22 1241  Weight: 62 kg    Examination:   Constitutional: NAD, AAOx3, chorea present HEENT: conjunctivae and lids normal, EOMI CV: No cyanosis.   RESP: normal respiratory effort, on RA Extremities: erythema in BLE.  Right lower leg with gauze over skin abrasions SKIN: warm, dry  Psych: Normal mood and affect.  Appropriate judgement and reason   Data Reviewed: I have personally reviewed labs and imaging studies  Time spent: 50 minutes  Enzo Bi, MD [facility] If 7PM-7AM, please contact night-coverage 10/09/2022, 6:18 PM

## 2022-10-09 NOTE — Evaluation (Signed)
Occupational Therapy Evaluation Patient Details Name: Stephanie [name] MRN: [id number] DOB: 08/27/1946 Today's Date: 10/09/2022   History of Present Illness Stephanie [name] is a 76 y.o. female with medical history significant of HFrEF, chronic hypoxic respiratory failure on 2 L, Parkinson's, type 2 diabetes, hypertension, hypothyroidism, who presents to the ED with complaints of right knee pain. Right lower extremity Doppler negative for DVT but notable for Baker's cyst measuring 4.3 x 3.1 x 1.1 centimeters.  Right knee x-ray was obtained that demonstrated tricompartmental spurring and mild loss of the articular space in the patellofemoral joint, small knee effusion and subcutaneous edema.  Due to inability to ambulate,   Clinical Impression   Patient presenting with decreased independence in self care, balance, functional mobility/transfers, and endurance. Prior to admission, pt lived with daughter, was Mod I for ADLs, IND for light IADLs, and Mod I for functional mobility using a rollator. Pt currently functioning at Mod I for bed mobility, Min guard for [facility] transfer, supervision-CGA for functional mobility in the hallway using rollator, and Min A for LB dressing. Pt reporting mild R knee pain during activity, RN aware. Pt will benefit from acute OT to increase overall independence in the areas of ADLs and functional mobility in order to safely discharge home. Pt could benefit from [facility] following D/C to decrease falls risk, improve balance, and maximize independence in self-care within own home environment.    Recommendations for follow up therapy are one component of a multi-disciplinary discharge planning process, led by the attending physician.  Recommendations may be updated based on patient status, additional functional criteria and insurance authorization.   Follow Up Recommendations  Home health OT     Assistance Recommended at Discharge Intermittent Supervision/Assistance  Patient  can return home with the following A little help with walking and/or transfers;A little help with bathing/dressing/bathroom;Assistance with cooking/housework;Assist for transportation;Help with stairs or ramp for entrance    Functional Status Assessment  Patient has had a recent decline in their functional status and demonstrates the ability to make significant improvements in function in a reasonable and predictable amount of time.  Equipment Recommendations  None recommended by OT    Recommendations for Other Services       Precautions / Restrictions Precautions Precautions: Fall Restrictions Weight Bearing Restrictions: No      Mobility Bed Mobility Overal bed mobility: Modified Independent                  Transfers Overall transfer level: Needs assistance Equipment used: Rollator (4 wheels) Transfers: Sit to/from Stand Sit to Stand: Supervision, Min guard           General transfer comment: STS from EOB and BSC      Balance Overall balance assessment: Modified Independent, History of Falls                                         ADL either performed or assessed with clinical judgement   ADL Overall ADL's : Needs assistance/impaired Eating/Feeding: Set up;Sitting   Grooming: Set up;Sitting;Wash/dry face               Lower Body Dressing: Minimal assistance;Sitting/lateral leans Lower Body Dressing Details (indicate cue type and reason): to doff soiled brief over LEs and don new one Toilet Transfer: BSC/3in1;Ambulation;Min guard;Rollator (4 wheels)   Toileting- Clothing Manipulation and Hygiene: Minimal assistance;Sit to/from stand;Min guard Toileting -  Clothing Manipulation Details (indicate cue type and reason): Min guard for peri care in standing, Min A for clothing management of brief in standing     Functional mobility during ADLs: Supervision/safety;Min guard;Rollator (4 wheels) (supervision-CGA, in the hallway)        Vision Baseline Vision/History: 1 Wears glasses (reading) Patient Visual Report: No change from baseline       Perception     Praxis      Pertinent Vitals/Pain Pain Assessment Pain Assessment: Faces Faces Pain Scale: Hurts a little bit Pain Location: R knee Pain Descriptors / Indicators: Discomfort, Sore Pain Intervention(s): Monitored during session, Repositioned     Hand Dominance Right   Extremity/Trunk Assessment Upper Extremity Assessment Upper Extremity Assessment: Generalized weakness (baseline tremors)   Lower Extremity Assessment Lower Extremity Assessment: RLE deficits/detail RLE Deficits / Details: mild pain right knee RLE Sensation: WNL RLE Coordination: WNL       Communication Communication Communication: No difficulties   Cognition Arousal/Alertness: Awake/alert Behavior During Therapy: WFL for tasks assessed/performed Overall Cognitive Status: Within Functional Limits for tasks assessed                                       General Comments       Exercises Other Exercises Other Exercises: OT provided education re: role of OT, OT POC, post acute recs, sitting up for all meals, EOB/OOB mobility with assistance, home/fall safety, pursed lip breathing   Shoulder Instructions      Home Living Family/patient expects to be discharged to:: Private residence Living Arrangements: Children Available Help at Discharge: Family;Available PRN/intermittently Type of Home: House Home Access: Stairs to enter [employer] of Steps: 2   Home Layout: One level     Bathroom Shower/Tub: Tub/shower unit;Sponge bathes at baseline   [employer]: Standard     Home Equipment: Rollator (4 wheels);Cane - quad;Shower seat;BSC/3in1   Additional Comments: Lives with oldest daughter who works during the day, 2L O2 at baseline      Prior Functioning/Environment Prior Level of Function : Independent/Modified Independent;History of  Falls (last six months)             Mobility Comments: used rollator in the home ADLs Comments: Mod I ADLs (sponge bathe occasionally, reports she can still get into shower for bathing), IND for light IADLs (cleaning, meal prep, laundry), places BSC in room at night, 3 falls past 6 months        OT Problem List: Decreased strength;Decreased activity tolerance;Impaired balance (sitting and/or standing);Cardiopulmonary status limiting activity;Pain      OT Treatment/Interventions: Self-care/ADL training;Therapeutic exercise;Therapeutic activities;Energy conservation;DME and/or AE instruction;Patient/family education;Balance training    OT Goals(Current goals can be found in the care plan section) Acute Rehab OT Goals Patient Stated Goal: go home OT Goal Formulation: With patient Time For Goal Achievement: 10/23/22 Potential to Achieve Goals: Good   OT Frequency: Min 2X/week    Co-evaluation              AM-PAC OT "6 Clicks" Daily Activity     Outcome Measure Help from another person eating meals?: None Help from another person taking care of personal grooming?: A Little Help from another person toileting, which includes using toliet, bedpan, or urinal?: A Little Help from another person bathing (including washing, rinsing, drying)?: A Little Help from another person to put on and taking off regular upper body clothing?: None Help  from another person to put on and taking off regular lower body clothing?: A Little 6 Click Score: 20   End of Session Equipment Utilized During Treatment: Gait belt;Rollator (4 wheels) Nurse Communication: Mobility status  Activity Tolerance: Patient tolerated treatment well Patient left: in bed;with call bell/phone within reach;with bed alarm set  OT Visit Diagnosis: Unsteadiness on feet (R26.81);Pain;Muscle weakness (generalized) (M62.81) Pain - Right/Left: Right Pain - part of body: Knee                Time: 6147-0929 OT Time  Calculation (min): 33 min Charges:  OT General Charges $OT Visit: 1 Visit OT Evaluation $OT Eval Low Complexity: 1 Low  [facility] MS, OTR/L ascom [phone]  10/09/22, 1:45 PM

## 2022-10-09 NOTE — Consult Note (Signed)
ORTHOPAEDIC CONSULTATION  REQUESTING PHYSICIAN: Enzo Bi, MD  Chief Complaint:   Right knee pain and swelling  History of Present Illness: Stephanie [name] is a 76 y.o. female with multiple medical problems including Parkinson's disease, hypertension, hyperlipidemia, congestive heart failure, O2 dependent COPD, diabetes, and hypothyroidism who normally ambulates with a walker.  The patient presented the emergency room yesterday evening complaining of progressively worsening right knee pain and swelling, making it difficult for her to ambulate.  X-rays of her knee showed evidence of degenerative joint disease well a Doppler ultrasound demonstrated a large Baker's cyst.  The patient was admitted for pain control and possible placement.  The patient denies any specific injury to the knee and denies any fevers or chills.  She also denies any numbness or paresthesias down her leg to her foot.  The patient has been afebrile throughout her hospital stay, and exhibits a normal white count with no left shift on her CBC.  The patient states that she does have a history of gout.  Past Medical History:  Diagnosis Date   Anemia    Arthritis    CHF (congestive heart failure) (HCC)    Chronic back pain    Diabetes mellitus without complication (HCC)    Dysrhythmia    GERD (gastroesophageal reflux disease)    Hyperlipidemia    Hypertension    Hypothyroid    Multilevel degenerative disc disease    Parkinson's disease ([place])    Sleep apnea    Syncope    Tremors of nervous system    Wears dentures    full upper and lower   Past Surgical History:  Procedure Laterality Date   ABDOMINAL HYSTERECTOMY     BACK SURGERY  09/16   CARDIAC CATHETERIZATION     CATARACT EXTRACTION W/PHACO Right 11/24/2015   Procedure: CATARACT EXTRACTION PHACO AND INTRAOCULAR LENS PLACEMENT ([place]);  Surgeon: Estill Cotta, MD;  Location: [facility] OR[facility];  Service:  Ophthalmology;  Laterality: Right;  [place] 01:14 AP% 25.5 CDE 30.69 fluid pack lot # [id number]   CATARACT EXTRACTION W/PHACO Left 01/06/2021   Procedure: CATARACT EXTRACTION PHACO AND INTRAOCULAR LENS PLACEMENT ([facility]) LEFT DIABETIC 6.98 00:40.1;  Surgeon: Birder Robson, MD;  Location: [place];  Service: Ophthalmology;  Laterality: Left;  Diabetic - oral meds   COLONOSCOPY     COLONOSCOPY WITH PROPOFOL N/A 07/24/2019   Procedure: COLONOSCOPY WITH PROPOFOL;  Surgeon: Toledo, Benay Pike, MD;  Location: [facility];  Service: Gastroenterology;  Laterality: N/A;   ESOPHAGOGASTRODUODENOSCOPY N/A 07/24/2019   Procedure: ESOPHAGOGASTRODUODENOSCOPY ([facility]);  Surgeon: Toledo, Benay Pike, MD;  Location: [facility] END[facility];  Service: Gastroenterology;  Laterality: N/A;   OOPHORECTOMY     Social History   Socioeconomic History   Marital status: Divorced    Spouse name: Not on file   Number of children: Not on file   Years of education: Not on file   Highest education level: Not on file  Occupational History   Not on file  Tobacco Use   Smoking status: Never   Smokeless tobacco: Never  Vaping Use   Vaping Use: Never used  Substance and Sexual Activity   Alcohol use: Not Currently    Comment: rare   Drug use: No   Sexual activity: Not Currently  Other Topics Concern   Not on file  Social History Narrative   Not on file   Social Determinants of Health   Financial Resource Strain: Not on file  Food Insecurity: Not on file  Transportation Needs:  Not on file  Physical Activity: Not on file  Stress: Not on file  Social Connections: Not on file   Family History  Problem Relation Age of Onset   Breast cancer Paternal Aunt    No Known Allergies Prior to Admission medications   Medication Sig Start Date End Date Taking? Authorizing Provider  busPIRone (BUSPAR) 10 MG tablet Take 10 mg by mouth 2 (two) times daily. 07/22/22 07/22/23 Yes [provider]  potassium chloride  (KLOR-CON) 10 MEQ tablet Take 10 mEq by mouth once daily as needed with torsemide. 09/03/22  Yes [provider]  tiZANidine (ZANAFLEX) 4 MG tablet Take 4 mg by mouth 3 (three) times daily. Take for 10 days. 10/06/22 10/16/22 Yes [provider]  allopurinol (ZYLOPRIM) 100 MG tablet Take 100 mg by mouth daily.    [provider]  aspirin EC 81 MG tablet Take 1 tablet (81 mg total) by mouth daily. 02/16/22   Thurnell Lose, MD  carbidopa-levodopa (SINEMET CR) 50-200 MG tablet Take 1 tablet by mouth at bedtime.    [provider]  carbidopa-levodopa (SINEMET IR) 25-100 MG tablet Take 2.5 tablets by mouth 4 (four) times daily.    [provider]  Continuous Blood Gluc Sensor (FREESTYLE LIBRE 14 DAY SENSOR) MISC by Does not apply route.    [provider]  entacapone (COMTAN) 200 MG tablet Take 100 mg by mouth 3 (three) times daily.    [provider]  fluticasone (FLONASE) 50 MCG/ACT nasal spray Place 1-2 sprays into the nose daily as needed for allergies. 09/29/15   [provider]  gabapentin (NEURONTIN) 300 MG capsule Take 300 mg by mouth at bedtime.    [provider]  glucose 4 GM chewable tablet Chew 1 tablet by mouth as needed for low blood sugar.    [provider]  levothyroxine (SYNTHROID) 75 MCG tablet Take 75 mcg by mouth daily. 07/20/21 07/20/22  [provider]  losartan (COZAAR) 100 MG tablet Take 1 tablet (100 mg total) by mouth daily. 01/07/22 04/07/22  Alisa Graff, FNP  metFORMIN (GLUCOPHAGE) 1000 MG tablet Take 1,000 mg by mouth 2 (two) times daily with a meal.    [provider]  metoprolol succinate (TOPROL-XL) 50 MG 24 hr tablet Take 50 mg by mouth daily. Take with or immediately following a meal.    [provider]  Multiple Vitamin (MULTIVITAMIN WITH MINERALS) TABS tablet Take 1 tablet by mouth daily.    [provider]  oxyCODONE-acetaminophen  (PERCOCET/ROXICET) 5-325 MG tablet Take 1 tablet by mouth every 6 (six) hours as needed for severe pain. 02/16/22   Thurnell Lose, MD  pantoprazole (PROTONIX) 40 MG tablet Take 40 mg by mouth daily.    [provider]  pramipexole (MIRAPEX) 0.125 MG tablet Take 0.125 mg by mouth 4 (four) times daily.    [provider]  pravastatin (PRAVACHOL) 40 MG tablet Take 40 mg by mouth daily.    [provider]  QUEtiapine (SEROQUEL) 100 MG tablet Take 100 mg by mouth at bedtime. 02/03/22   [provider]  sertraline (ZOLOFT) 25 MG tablet Take 1 tablet (25 mg total) by mouth daily. 09/08/21   Donne Hazel, MD  torsemide (DEMADEX) 20 MG tablet Take 20 mg by mouth daily as needed (fluid). 10/06/21   [provider]  glimepiride (AMARYL) 4 MG tablet Take 4 mg by mouth 2 (two) times daily. Pt. Takes 1 tablet in the morning  and .5 tablet at night.  12/05/20  [provider]   DG Chest 1 View  Result Date: 10/08/2022 CLINICAL DATA:  Shortness of breath EXAM: CHEST  1 VIEW COMPARISON:  02/13/2022, CT 07/22/2021, chest x-ray 08/31/2021, 07/06/2021 FINDINGS: Cardiomegaly. Underlying chronic interstitial opacities. Mild diffuse increased interstitial densities. Atelectasis or scarring at the mid to lower lungs. No pleural effusion or pneumothorax. IMPRESSION: Cardiomegaly with mild diffuse increased interstitial densities which may be due to mild edema or inflammatory process superimposed on underlying chronic disease. Electronically Signed   By: Donavan Foil M.D.   On: 10/08/2022 21:28   DG Knee 2 Views Right  Result Date: 10/08/2022 CLINICAL DATA:  Right knee pain and swelling for 2 days EXAM: RIGHT KNEE - 1-2 VIEW COMPARISON:  None Available. FINDINGS: Tricompartmental spurring loss of articular space in the patellofemoral joint. Suspected small knee effusion although the suprapatellar bursa is ill-defined. Faint linear calcifications along the menisci may  reflect subtle chondrocalcinosis. Subcutaneous edema noted in the distal thigh, knee region, calf. Chronically fragmented distal quadriceps osteophyte along the patella. IMPRESSION: 1. Tricompartmental spurring and mild loss of articular space in the patellofemoral joint. 2. Suspected small knee effusion. 3. Subcutaneous edema in the distal thigh, knee region, and calf. 4. Chronically fragmented distal quadriceps osteophyte along the patella. Electronically Signed   By: Van Clines M.D.   On: 10/08/2022 17:57   US Venous Img Lower Unilateral Right  Result Date: 10/08/2022 CLINICAL DATA:  Right lower extremity pain and swelling EXAM: RIGHT LOWER EXTREMITY VENOUS DOPPLER ULTRASOUND TECHNIQUE: Gray-scale sonography with compression, as well as color and duplex ultrasound, were performed to evaluate the deep venous system(s) from the level of the common femoral vein through the popliteal and proximal calf veins. COMPARISON:  07/21/2021 FINDINGS: VENOUS Normal compressibility of the common femoral, superficial femoral, and popliteal veins, as well as the visualized calf veins, although the peroneal vein could not be visualized. Visualized portions of profunda femoral vein and great saphenous vein unremarkable. No filling defects to suggest DVT on grayscale or color Doppler imaging. Doppler waveforms show normal direction of venous flow, normal respiratory plasticity and response to augmentation. Limited views of the contralateral common femoral vein are unremarkable. OTHER Baker cyst in the right popliteal fossa, measuring 4.3 x 3.1 x 1.1 cm Limitations: Peroneal vein could not be visualized. IMPRESSION: 1. No evidence of deep venous thrombosis in the right lower extremity, although the right peroneal vein could not be visualized RO but. 2. 4.3 x 3.1 x 1.1 cm Baker cyst in the right popliteal fossa. Negative. Electronically Signed   By: Merilyn Baba M.D.   On: 10/08/2022 14:12    Positive ROS: All other  systems have been reviewed and were otherwise negative with the exception of those mentioned in the HPI and as above.  Physical Exam: General:  Alert, no acute distress Psychiatric:  Patient is competent for consent with normal mood and affect   Cardiovascular:  No pedal edema Respiratory:  No wheezing, non-labored breathing GI:  Abdomen is soft and non-tender Skin:  No lesions in the area of chief complaint Neurologic:  Sensation intact distally Lymphatic:  No axillary or cervical lymphadenopathy  Orthopedic Exam:  Orthopedic examination is limited to the right knee and lower extremity.  Skin inspection of the right knee is notable for mild swelling as well as a small effusion.  No erythema, ecchymosis, abrasions, or other skin abnormalities are noted around the knee.  However, there is significant erythema and  edema involving the lower half of the lower leg extending to the ankle, consistent with possible cellulitis.  The patient notes mild tenderness to palpation over the medial and lateral aspects of the knee.  However, she is able to actively extend her knee fully and flex her knee beyond 70 degrees.  Passively, she can tolerate knee flexion to 90 degrees.  Her patella tracks well with mild crepitance.  The knee appears grossly stable to varus valgus stressing as well as with a negative anterior drawer maneuver.  Grossly, the patient is neurovascularly intact to the right foot and lower leg.  X-rays:  Recent x-rays of the right knee are available for review and have been reviewed by myself.  These films demonstrate evidence of mild tricompartmental degenerative changes, as well as a small effusion.  There is no evidence for fractures, lytic lesions, or other significant bony abnormalities.  Assessment: Right knee effusion with underlying degenerative joint disease.  Plan: The treatment options have been discussed with the patient.  The patient is offered excepted an aspiration/steroid  injection of the right knee.  After obtaining verbal consent, this procedure is performed sterilely.  An attempt was made to aspirate some fluid from the joint, but no fluid could be obtained.  Therefore, the knee was injected using a solution of 2 cc of Kenalog 40 (80 mg) and 8 cc of 0.5% Sensorcaine.  The patient tolerated the procedure well.  The patient may be mobilized with physical therapy, weightbearing as tolerated on the right leg.  She may receive pain medication as deemed appropriate medically for her knee if necessary.  Thank you for asking me to participate in the care of this most likely unfortunate woman.  I will be happy to follow her with you.   Pascal Lux, MD  Beeper #:  [phone]  10/09/2022 12:32 PM

## 2022-10-09 NOTE — Plan of Care (Signed)
  Problem: Nutrition: Goal: Adequate nutrition will be maintained Outcome: Progressing   Problem: Coping: Goal: Level of anxiety will decrease Outcome: Progressing   Problem: Elimination: Goal: Will not experience complications related to urinary retention Outcome: Progressing   Problem: Pain Managment: Goal: General experience of comfort will improve Outcome: Progressing   

## 2022-10-09 NOTE — ED Notes (Signed)
Round check complete at this time. Pt is sleeping at this time

## 2022-10-09 NOTE — Progress Notes (Signed)
Mobility Specialist - Progress Note    10/09/22 1652  Mobility  Activity Ambulated with assistance in hallway  Level of Assistance Contact guard assist, steadying assist  Assistive Device Front wheel walker  Distance Ambulated (ft) 65 ft  Activity Response Tolerated well  Mobility Referral Yes  $Mobility charge 1 Mobility   Pt resting on 2L upon entry. Pt STS and ambulates in the hallway CGA. Pt has shaky gait but motivated to walking. Pt returned to bed and left with needs in reach and bed alarm activated.   Loma Sender Mobility Specialist 10/09/22, 4:56 PM

## 2022-10-09 NOTE — Evaluation (Signed)
Physical Therapy Evaluation Patient Details Name: Stephanie [name] MRN: [id number] DOB: 05/10/1946 Today's Date: 10/09/2022  History of Present Illness  Stephanie [name] is a 76 y.o. female with medical history significant of HFrEF, chronic hypoxic respiratory failure on 2 L, Parkinson's, type 2 diabetes, hypertension, hypothyroidism, who presents to the ED with complaints of right knee pain. Right lower extremity Doppler negative for DVT but notable for Baker's cyst measuring 4.3 x 3.1 x 1.1 centimeters.  Right knee x-ray was obtained that demonstrated tricompartmental spurring and mild loss of the articular space in the patellofemoral joint, small knee effusion and subcutaneous edema.  Due to inability to ambulate,   Clinical Impression  Patient received in bed with OT at bedside. Patient is agreeable to assessment. She has baseline tremors. Patient is mod I for bed mobility, transfers with min guard. She is able to ambulate with rollator 50 feet with supervision/min guard. She is at increased fall risk mainly due to tremors, but this is not new. Patient will continue to benefit from skilled PT to improve balance and safety with mobility for fall prevention.         Recommendations for follow up therapy are one component of a multi-disciplinary discharge planning process, led by the attending physician.  Recommendations may be updated based on patient status, additional functional criteria and insurance authorization.  Follow Up Recommendations Home health PT      Assistance Recommended at Discharge Intermittent Supervision/Assistance  Patient can return home with the following  A little help with walking and/or transfers;A little help with bathing/dressing/bathroom;Assist for transportation;Help with stairs or ramp for entrance    Equipment Recommendations None recommended by PT  Recommendations for Other Services       Functional Status Assessment Patient has had a recent decline  in their functional status and demonstrates the ability to make significant improvements in function in a reasonable and predictable amount of time.     Precautions / Restrictions Precautions Precautions: Fall Restrictions Weight Bearing Restrictions: No      Mobility  Bed Mobility Overal bed mobility: Modified Independent                  Transfers Overall transfer level: Needs assistance Equipment used: Rollator (4 wheels) Transfers: Sit to/from Stand Sit to Stand: Supervision, Min guard                Ambulation/Gait Ambulation/Gait assistance: Supervision Gait Distance (Feet): 50 Feet Assistive device: Rollator (4 wheels) Gait Pattern/deviations: Step-through pattern Gait velocity: decr     General Gait Details: patient ambulated out in hall with rollator. Min guard to supervision. Appears to be close to baseline.  Stairs            Wheelchair Mobility    Modified Rankin (Stroke Patients Only)       Balance Overall balance assessment: Modified Independent, History of Falls                                           Pertinent Vitals/Pain Pain Assessment Pain Assessment: Faces Faces Pain Scale: Hurts a little bit Pain Location: R knee Pain Descriptors / Indicators: Discomfort, Sore Pain Intervention(s): Monitored during session, Repositioned    Home Living Family/patient expects to be discharged to:: Private residence Living Arrangements: Children Available Help at Discharge: Family;Available PRN/intermittently Type of Home: House Home Access: Stairs to enter  Entrance Stairs-Number of Steps: 2   Home Layout: One level Home Equipment: Rollator (4 wheels);Cane - quad;Shower seat;BSC/3in1      Prior Function Prior Level of Function : Independent/Modified Independent             Mobility Comments: used rollator in the home ADLs Comments: sponge bathed for safety recently per pt     Hand Dominance    Dominant Hand: Right    Extremity/Trunk Assessment   Upper Extremity Assessment Upper Extremity Assessment: Defer to OT evaluation    Lower Extremity Assessment Lower Extremity Assessment: RLE deficits/detail RLE Deficits / Details: mild pain right knee RLE Sensation: WNL RLE Coordination: WNL       Communication   Communication: No difficulties  Cognition Arousal/Alertness: Awake/alert Behavior During Therapy: WFL for tasks assessed/performed Overall Cognitive Status: Within Functional Limits for tasks assessed                                          General Comments      Exercises     Assessment/Plan    PT Assessment Patient needs continued PT services  PT Problem List Decreased strength;Decreased balance;Decreased mobility       PT Treatment Interventions Gait training;Therapeutic exercise;Functional mobility training;Therapeutic activities;Stair training;Patient/family education;Balance training    PT Goals (Current goals can be found in the Care Plan section)  Acute Rehab PT Goals Patient Stated Goal: she would like to go home with [facility] therapy PT Goal Formulation: With patient Time For Goal Achievement: 10/23/22 Potential to Achieve Goals: Good    Frequency Min 2X/week     Co-evaluation               AM-PAC PT "6 Clicks" Mobility  Outcome Measure Help needed turning from your back to your side while in a flat bed without using bedrails?: None Help needed moving from lying on your back to sitting on the side of a flat bed without using bedrails?: None Help needed moving to and from a bed to a chair (including a wheelchair)?: A Little Help needed standing up from a chair using your arms (e.g., wheelchair or bedside chair)?: A Little Help needed to walk in hospital room?: A Little Help needed climbing 3-5 steps with a railing? : A Lot 6 Click Score: 19    End of Session Equipment Utilized During Treatment: Gait  belt;Oxygen Activity Tolerance: Patient tolerated treatment well Patient left: in bed;with call bell/phone within reach;with bed alarm set Nurse Communication: Mobility status PT Visit Diagnosis: Unsteadiness on feet (R26.81);Muscle weakness (generalized) (M62.81);Difficulty in walking, not elsewhere classified (R26.2);History of falling (Z91.81);Other abnormalities of gait and mobility (R26.89)    Time: 2993-7169 PT Time Calculation (min) (ACUTE ONLY): 22 min   Charges:   PT Evaluation $PT Eval Low Complexity: 1 Low          Mani Celestin, PT, GCS 10/09/22,12:15 PM

## 2022-10-10 DIAGNOSIS — M25461 Effusion, right knee: Secondary | ICD-10-CM | POA: Diagnosis not present

## 2022-10-10 LAB — BASIC METABOLIC PANEL
Anion gap: 5 (ref 5–15)
BUN: 20 mg/dL (ref 8–23)
CO2: 28 mmol/L (ref 22–32)
Calcium: 8.5 mg/dL — ABNORMAL LOW (ref 8.9–10.3)
Chloride: 104 mmol/L (ref 98–111)
Creatinine, Ser: 0.61 mg/dL (ref 0.44–1.00)
GFR, Estimated: >60 mL/min (ref >60)
Glucose, Bld: 97 mg/dL (ref 70–99)
Potassium: 4.4 mmol/L (ref 3.5–5.1)
Sodium: 137 mmol/L (ref 135–145)

## 2022-10-10 LAB — CBC
HCT: 36.9 % (ref 36.0–46.0)
Hemoglobin: 11.7 g/dL — ABNORMAL LOW (ref 12.0–15.0)
MCH: 29.9 pg (ref 26.0–34.0)
MCHC: 31.7 g/dL (ref 30.0–36.0)
MCV: 94.4 fL (ref 80.0–100.0)
Platelets: 271 10*3/uL (ref 150–400)
RBC: 3.91 MIL/uL (ref 3.87–5.11)
RDW: 15 % (ref 11.5–15.5)
WBC: 5.8 10*3/uL (ref 4.0–10.5)
nRBC: 0 % (ref 0.0–0.2)

## 2022-10-10 LAB — MAGNESIUM: Magnesium: 1.8 mg/dL (ref 1.7–2.4)

## 2022-10-10 NOTE — Progress Notes (Signed)
  PROGRESS NOTE    Stephanie [name]  XMI:[id number] DOB: 24-May-1946 DOA: 10/08/2022 PCP: [name], MD  [id number]/[id number]-AA  LOS: 0 days   Brief hospital course:   Assessment & Plan: Stephanie [name] is a 76 y.o. female with medical history significant of HFrEF, chronic hypoxic respiratory failure on 2 L, Parkinson's, type 2 diabetes, hypertension, hypothyroidism, who presents to the ED with complaints of right knee pain.    * Effusion of right knee with underlying degenerative joint disease  Patient presenting with approximately 3-day history of right knee pain with gradual worsening leading to inability to bear weight.  Dopplers negative for DVT, however large Baker's cyst noted.  xray showed Tricompartmental spurring and mild loss of articular space in the patellofemoral joint and Suspected small knee effusion. --Ortho Dr. Roland Rack attempted right knee aspiration on 12/9, but no fluid return.  Kenalog injection into the knee was done. --pain still not improved, interferes with mobility Plan: --IR to aspirate Baker's cyst, to see that would help with the pain --PT/OT to reval tomorrow   Chronic HFrEF (heart failure with reduced ejection fraction) (HCC) --on home torsemide 40 mg daily PRN. CXR appears improved compared to prior and lung examination is clear.  - s/p Lasix 60 mg IV one time --cont Toprol   Parkinson's disease - cont home medications, including Carbidopa-Levodopa, Entacapone, and Pramipexole    Type 2 diabetes mellitus ([place]) --recent A1c 6.8, well controlled --d/c'ed BG checks and SSI --cont home metformin   Chronic hypoxic respiratory failure ([place]) on 2L PRN Secondary to HFrEF. Patient oxygenating well on room air, however endorsing mild SOB. Examination and CXR reassuring.  --cont home 2L O2  Weight loss Per chart review, significant weight loss over the past 18 months, approximately 60 lbs.   - Recommend PCP follow with age-appropriate cancer  screening   DVT prophylaxis: Lovenox SQ Code Status: Full code  Family Communication:  Level of care: Med-Surg Dispo:   The patient is from: home Anticipated d/c is to: to be determined Anticipated d/c date is: to be determined   Subjective and Interval History:  Pt reported right knee pain improved after steroid injection yesterday, but didn't last, and this morning, pt reported more pain in her right knee.   Objective: Vitals:   10/09/22 1459 10/09/22 1655 10/10/22 0039 10/10/22 0820  BP: (!) 160/84 (!) 143/91 (!) 146/62 (!) 164/93  Pulse: 73 70 64 78  Resp: '17 16 18 16  '$ Temp: 97.7 F (36.5 C) 97.9 F (36.6 C) 97.6 F (36.4 C) 98.6 F (37 C)  TempSrc:      SpO2: 100% 97% 98% 96%  Weight:      Height:       No intake or output data in the 24 hours ending 10/10/22 1546 Filed Weights   10/08/22 1241  Weight: 62 kg    Examination:   Constitutional: NAD, AAOx3 HEENT: conjunctivae and lids normal, EOMI CV: No cyanosis.   RESP: normal respiratory effort, on RA Extremities: no swelling, erythema or warmth over right knee SKIN: warm, dry Neuro: II - XII grossly intact.   Psych: Normal mood and affect.  Appropriate judgement and reason   Data Reviewed: I have personally reviewed labs and imaging studies  Time spent: 35 minutes  Enzo Bi, MD [facility] If 7PM-7AM, please contact night-coverage 10/10/2022, 3:46 PM

## 2022-10-10 NOTE — Progress Notes (Signed)
Mobility Specialist - Progress Note     10/10/22 1423  Mobility  Activity Ambulated with assistance in room;Transferred to/from [facility]  Level of Assistance Contact guard assist, steadying assist  Assistive Device Front wheel walker  Distance Ambulated (ft) 5 ft  Activity Response Tolerated well  Mobility Referral Yes  $Mobility charge 1 Mobility   Pt resting in bed upon entry on 2L. Pt ambulates to RW and performs 4 step turn to [facility]. Pt returned to bed and left with needs in reach with bed alarm activated. Pt left with family present.   Loma Sender Mobility Specialist 10/10/22, 2:26 PM

## 2022-10-10 NOTE — Care Management Obs Status (Addendum)
[facility] NOTIFICATION   Patient Details  Name: KAYLEANNA LORMAN MRN: [id number] Date of Birth: 10-16-1946   Medicare Observation Status Notification Given:       Izola Price, RN 10/10/2022, 9:08 AM  Patient new to this unit late on 10/09/22. Caught OBS time this am and contacted patient. MOON explained and e-signed. Simmie Davies RN CM

## 2022-10-10 NOTE — Progress Notes (Signed)
Mobility Specialist - Progress Note    10/10/22 1409  Mobility  Activity Ambulated with assistance in hallway;Stood at bedside  Level of Assistance Contact guard assist, steadying assist (Two person assist to stand (for patient comfort) and 1+ assist to ambulate)  Assistive Device Front wheel walker  Distance Ambulated (ft) 240 ft  Activity Response Tolerated well  Mobility Referral Yes  $Mobility charge 1 Mobility   Pt resting in bed on 2L upon entry. Pt STS MinA with 2+ people (patient request) and ambulates to hallway CGA. Pt initially could not hold herself up sitting or standing but maintained balance after a few seconds. Pt had decreased spasms during ambulation today and gait speed increased. Pt still has short choppy steps. Pt returned to bed and left with needs in reach. Pt bed alarm activated. Pt family member present in room.   Loma Sender Mobility Specialist 10/10/22, 2:23 PM

## 2022-10-10 NOTE — Progress Notes (Signed)
Patient ID: Stephanie [name], female   DOB: 1946-10-23, 76 y.o.   MRN: [id number]  Subjective: The patient notes some improvement in her knee pain and ability to flex and extend her knee, but continues to experience pain in the posterior aspect of her posterior thigh and knee.  She has been able to tolerate physical therapy, ambulating in the hall weightbearing as tolerated on the right leg.  She has no new complaints regarding her right knee.   Objective: Vital signs in last 24 hours: Temp:  [97.6 F (36.4 C)-98.6 F (37 C)] 98.6 F (37 C) (12/10 0820) Pulse Rate:  [64-78] 78 (12/10 0820) Resp:  [16-18] 16 (12/10 0820) BP: (143-164)/(62-93) 164/93 (12/10 0820) SpO2:  [96 %-100 %] 96 % (12/10 0820)  Intake/Output from previous day: No intake/output data recorded. Intake/Output this shift: No intake/output data recorded.  Recent Labs    10/08/22 1909 10/09/22 0436 10/10/22 0407  HGB 13.6 11.4* 11.7*   Recent Labs    10/09/22 0436 10/10/22 0407  WBC 6.4 5.8  RBC 3.74* 3.91  HCT 35.6* 36.9  PLT 243 271   Recent Labs    10/09/22 0718 10/10/22 0407  NA 140 137  K 3.9 4.4  CL 104 104  CO2 30 28  BUN 19 20  CREATININE 0.80 0.61  GLUCOSE 89 97  CALCIUM 9.1 8.5*   No results for input(s): "LABPT", "INR" in the last 72 hours.  Physical Exam: Orthopedic examination again is limited to the right knee and lower extremity.  Skin inspection around the right knee is notable for minimal swelling as well as at most trace effusion.  The swelling around the knee and lower extremity does appear to be improved as compared to yesterday, although she still exhibits moderate erythema over both lower legs, consistent with cellulitis.  She notes minimal tenderness to palpation over the medial aspect of the knee, and more mild tenderness over the posterior aspect of the knee.  She is able to flex and extend her knee actively with mild pain posteriorly.  Grossly, she is neurovascularly  intact to the right lower leg and foot.  Assessment: Degenerative joint disease with effusion, right knee, somewhat improved symptomatically.  Plan: The treatment options have been reviewed with the patient.  Given that the patient still has posterior knee pain, she might benefit from an aspiration of the Baker's cyst.  This can be done by the interventional radiologist under ultrasound or CT guidance while she is an inpatient.  The patient may be mobilized with physical therapy, weightbearing as tolerated to the right leg, as symptoms permit.  She may receive pain medication as deemed appropriate from a medical standpoint.   Marshall Cork Carmino Ocain 10/10/2022, 2:32 PM

## 2022-10-10 NOTE — TOC Initial Note (Addendum)
Transition of Care [facility]) - Initial/Assessment Note    Patient Details  Name: Stephanie [name] MRN: [id number] Date of Birth: [birth year]  Transition of Care [facility] CM/SW Contact:    Izola Price, RN Phone Number: 10/10/2022, 12:34 PM  Clinical Narrative:  Clinical Narrative:  12/10: Spoke with patient briefly again regarding [place] on discharge and patient referred RN CM to her daughter as she was "not doing well and am in a lot of pain". Contacted Daughter, with verbal permission from patient to discuss [place] options/choices when medically ready for discharge.   Daughter expressed at  length, concerns she is having over discharge readiness and the state her mother is in now compared to her baseline prior to this issue and admission.   Prior to the symptoms that brought patient to ED by daughter, patient was able to home alone at daughter's home with daughter and others are at work. She used a Radiation protection practitioner and was able to perform 75-85% of her ADL's without assistance from family. She was able to do light housework such as sweeping, able to walk up and down some steps and get herself in and out of shower for hygiene.   She is on 2L/[place] chronic oxygen mostly at night or if exerts self per daughter via [facility] oxygen service with newer condenser just delivered but does not yet have the transport tanks.   Patient has had [place] before, but cannot remember agency and does not have a preference at this time/daughter as well. [facility] accepted for service at discharge pending orders.   The daughter is very alarmed at the change in her ability to get around in such a short time, and feels she would be unsafe at home in her current condition. She wants to speak to providers when she arrives at hospital today. She indicated she would consider appealing discharge in patient's current state.   RN CM will update provider on daughter's concerns.   PCP Dr Doy Hutching at [facility] as why patient wanted to  come to [facility] instead of [facility] per daughter.   Simmie Davies RN CM    Adams,Donna (Daughter)  [phone] (Home Phone)                        Patient Goals and CMS Choice        Expected Discharge Plan and Services                                                Prior Living Arrangements/Services                       Activities of Daily Living Home Assistive Devices/Equipment: Dentures (specify type), Walker (specify type) (rolator) ADL Screening (condition at time of admission) Patient's cognitive ability adequate to safely complete daily activities?: Yes Is the patient deaf or have difficulty hearing?: No Does the patient have difficulty seeing, even when wearing glasses/contacts?: No Does the patient have difficulty concentrating, remembering, or making decisions?: No Patient able to express need for assistance with ADLs?: Yes Does the patient have difficulty dressing or bathing?: No  Permission Sought/Granted                  Emotional Assessment  Admission diagnosis:  Effusion of right knee [M25.461] Synovial cyst of right popliteal space [M71.21] Patient Active Problem List   Diagnosis Date Noted   Effusion of right knee 10/08/2022   Chronic hypoxic respiratory failure ([place]) 10/08/2022   Weight loss 10/08/2022   Dysphagia 02/12/2022   Chronic back pain 02/12/2022   HFrEF (heart failure with reduced ejection fraction) ([place]) 02/12/2022   SIRS (systemic inflammatory response syndrome) ([place]) 02/12/2022   CAP (community acquired pneumonia) 09/01/2021   Acute on chronic HFrEF (heart failure with reduced ejection fraction) ([place]) 08/31/2021   Hypertension associated with diabetes ([place]) 08/31/2021   Acute on chronic systolic (congestive) heart failure ([place]) 07/20/2021   SOB (shortness of breath) 07/20/2021   Acute respiratory failure with hypoxia ([place]) 07/20/2021   Type 2 diabetes mellitus ([place])    Hypothyroid     Parkinson's disease    Hyperlipidemia associated with type 2 diabetes mellitus ([place])    Gout, unspecified 11/01/2020   Left bundle branch block 05/09/2019   Hyponatremia 12/31/2015   PCP:  [name], MD Pharmacy:   [facility], [place] [address] Phone: [phone] Fax: [phone]     Social Determinants of Health (SDOH) Interventions    Readmission Risk Interventions    09/03/2021    4:31 PM  Readmission Risk Prevention Plan  Transportation Screening Complete  PCP or Specialist Appt within 3-5 Days Complete  HRI or [place] Complete  Social Work Consult for [place] Planning/Counseling Complete  Palliative Care Screening Not Applicable  Medication Review Press photographer) Complete

## 2022-10-11 ENCOUNTER — Inpatient Hospital Stay: Payer: Medicare Other

## 2022-10-11 DIAGNOSIS — Z7984 Long term (current) use of oral hypoglycemic drugs: Secondary | ICD-10-CM | POA: Diagnosis not present

## 2022-10-11 DIAGNOSIS — M109 Gout, unspecified: Secondary | ICD-10-CM | POA: Diagnosis present

## 2022-10-11 DIAGNOSIS — Z803 Family history of malignant neoplasm of breast: Secondary | ICD-10-CM | POA: Diagnosis not present

## 2022-10-11 DIAGNOSIS — K219 Gastro-esophageal reflux disease without esophagitis: Secondary | ICD-10-CM | POA: Diagnosis present

## 2022-10-11 DIAGNOSIS — I5022 Chronic systolic (congestive) heart failure: Secondary | ICD-10-CM | POA: Diagnosis present

## 2022-10-11 DIAGNOSIS — Z90710 Acquired absence of both cervix and uterus: Secondary | ICD-10-CM | POA: Diagnosis not present

## 2022-10-11 DIAGNOSIS — M1711 Unilateral primary osteoarthritis, right knee: Secondary | ICD-10-CM | POA: Diagnosis present

## 2022-10-11 DIAGNOSIS — I110 Hypertensive heart disease with heart failure: Secondary | ICD-10-CM | POA: Diagnosis present

## 2022-10-11 DIAGNOSIS — G4730 Sleep apnea, unspecified: Secondary | ICD-10-CM | POA: Diagnosis present

## 2022-10-11 DIAGNOSIS — G8929 Other chronic pain: Secondary | ICD-10-CM | POA: Diagnosis present

## 2022-10-11 DIAGNOSIS — G20A1 Parkinson's disease without dyskinesia, without mention of fluctuations: Secondary | ICD-10-CM | POA: Diagnosis present

## 2022-10-11 DIAGNOSIS — Z9981 Dependence on supplemental oxygen: Secondary | ICD-10-CM | POA: Diagnosis not present

## 2022-10-11 DIAGNOSIS — M25461 Effusion, right knee: Secondary | ICD-10-CM | POA: Diagnosis present

## 2022-10-11 DIAGNOSIS — M25561 Pain in right knee: Secondary | ICD-10-CM | POA: Diagnosis present

## 2022-10-11 DIAGNOSIS — E119 Type 2 diabetes mellitus without complications: Secondary | ICD-10-CM | POA: Diagnosis present

## 2022-10-11 DIAGNOSIS — M549 Dorsalgia, unspecified: Secondary | ICD-10-CM | POA: Diagnosis present

## 2022-10-11 DIAGNOSIS — Z79899 Other long term (current) drug therapy: Secondary | ICD-10-CM | POA: Diagnosis not present

## 2022-10-11 DIAGNOSIS — E039 Hypothyroidism, unspecified: Secondary | ICD-10-CM | POA: Diagnosis present

## 2022-10-11 DIAGNOSIS — E785 Hyperlipidemia, unspecified: Secondary | ICD-10-CM | POA: Diagnosis present

## 2022-10-11 DIAGNOSIS — R634 Abnormal weight loss: Secondary | ICD-10-CM | POA: Diagnosis present

## 2022-10-11 DIAGNOSIS — J449 Chronic obstructive pulmonary disease, unspecified: Secondary | ICD-10-CM | POA: Diagnosis present

## 2022-10-11 DIAGNOSIS — L539 Erythematous condition, unspecified: Secondary | ICD-10-CM | POA: Diagnosis present

## 2022-10-11 DIAGNOSIS — J9611 Chronic respiratory failure with hypoxia: Secondary | ICD-10-CM | POA: Diagnosis present

## 2022-10-11 DIAGNOSIS — I2510 Atherosclerotic heart disease of native coronary artery without angina pectoris: Secondary | ICD-10-CM | POA: Diagnosis present

## 2022-10-11 DIAGNOSIS — Z6826 Body mass index (BMI) 26.0-26.9, adult: Secondary | ICD-10-CM | POA: Diagnosis not present

## 2022-10-11 DIAGNOSIS — M7121 Synovial cyst of popliteal space [Baker], right knee: Secondary | ICD-10-CM | POA: Diagnosis present

## 2022-10-11 LAB — HEMOGLOBIN A1C
Hgb A1c MFr Bld: 6.3 % — ABNORMAL HIGH (ref 4.8–5.6)
Mean Plasma Glucose: 134 mg/dL

## 2022-10-11 MED ORDER — LIDOCAINE HCL (PF) 1 % IJ SOLN
10.0000 mL | Freq: Once | INTRAMUSCULAR | Status: AC
  Administered 2022-10-11: 10 mL via INTRADERMAL

## 2022-10-11 MED ORDER — LOSARTAN POTASSIUM 50 MG PO TABS
100.0000 mg | ORAL_TABLET | Freq: Every day | ORAL | Status: DC
  Administered 2022-10-11: 100 mg via ORAL
  Filled 2022-10-11: qty 2

## 2022-10-11 MED ORDER — OXYCODONE-ACETAMINOPHEN 5-325 MG PO TABS: 1.0000 | ORAL_TABLET | Freq: Four times a day (QID) | ORAL | 0 refills | Status: AC | PRN

## 2022-10-11 MED ORDER — DICLOFENAC SODIUM 1 % EX GEL: 4.0000 g | Freq: Four times a day (QID) | CUTANEOUS | 0 refills | Status: DC

## 2022-10-11 MED ORDER — ACETAMINOPHEN 500 MG PO TABS: 1000.0000 mg | ORAL_TABLET | Freq: Three times a day (TID) | ORAL | 0 refills | Status: DC | PRN

## 2022-10-11 NOTE — Progress Notes (Signed)
Occupational Therapy Treatment Patient Details Name: Stephanie [name] MRN: [id number] DOB: [birth year] Today's Date: 10/11/2022   History of present illness Stephanie [name] is a 76 y.o. female with medical history significant of HFrEF, chronic hypoxic respiratory failure on 2 L, Parkinson's, type 2 diabetes, hypertension, hypothyroidism, who presents to the ED with complaints of right knee pain. Right lower extremity Doppler negative for DVT but notable for Baker's cyst measuring 4.3 x 3.1 x 1.1 centimeters.  Right knee x-ray was obtained that demonstrated tricompartmental spurring and mild loss of the articular space in the patellofemoral joint, small knee effusion and subcutaneous edema.  Due to inability to ambulate,   OT comments  Ms Silverthorne was seen for OT treatment on this date. Upon arrival to room pt reclined in bed, agreeable to tx, requests to use bathroom. Pt requires increased time to exit bed, no assist. SBA + Rollator for toilet t/f, SUPERVISION for pericare sitting and grooming standing sink side. Pt locks breaks on rollator, steps forward, then unlocks breaks to advance rollator - discussed use of 2WW for safety with mobility. Initial MOD A standing from bed height - c/o inability to use R hand 2/2 IV at wrist. Greatly improves at toilet and chair height to CGA, endorses some light headedness. Mobility specialist in at end of session. Pt making good progress toward goals, will continue to follow POC. Discharge recommendation remains appropriate.     Recommendations for follow up therapy are one component of a multi-disciplinary discharge planning process, led by the attending physician.  Recommendations may be updated based on patient status, additional functional criteria and insurance authorization.    Follow Up Recommendations  Home health OT     Assistance Recommended at Discharge Intermittent Supervision/Assistance  Patient can return home with the following  A little  help with walking and/or transfers;A little help with bathing/dressing/bathroom;Assistance with cooking/housework;Assist for transportation;Help with stairs or ramp for entrance   Equipment Recommendations  BSC/3in1    Recommendations for Other Services      Precautions / Restrictions Precautions Precautions: Fall Restrictions Weight Bearing Restrictions: No       Mobility Bed Mobility Overal bed mobility: Needs Assistance Bed Mobility: Sit to Supine       Sit to supine: Supervision, HOB elevated        Transfers Overall transfer level: Needs assistance Equipment used: Rollator (4 wheels) Transfers: Sit to/from Stand Sit to Stand: Min guard           General transfer comment: MOD A from bed height - c/o inability to use R hand 2/2 IV at wrist. greatly improves at toilet and chair height to CGA     Balance Overall balance assessment: Needs assistance Sitting-balance support: No upper extremity supported, Feet supported Sitting balance-Leahy Scale: Good     Standing balance support: No upper extremity supported, During functional activity Standing balance-Leahy Scale: Fair                             ADL either performed or assessed with clinical judgement   ADL Overall ADL's : Needs assistance/impaired                                       General ADL Comments: SBA + Rollator for toilet t/f, SUPERVISION for pericare sitting and grooming standing sink side.  Cognition Arousal/Alertness: Awake/alert Behavior During Therapy: WFL for tasks assessed/performed Overall Cognitive Status: Within Functional Limits for tasks assessed                                                     Pertinent Vitals/ Pain       Pain Assessment Pain Assessment: 0-10 Pain Score: 2  Pain Location: R wrist at IV site Pain Descriptors / Indicators: Discomfort, Sore Pain Intervention(s): Limited activity within patient's  tolerance, Repositioned   Frequency  Min 3X/week        Progress Toward Goals  OT Goals(current goals can now be found in the care plan section)  Progress towards OT goals: Progressing toward goals  Acute Rehab OT Goals Patient Stated Goal: to go home OT Goal Formulation: With patient Time For Goal Achievement: 10/23/22 Potential to Achieve Goals: Good ADL Goals Pt Will Perform Grooming: with modified independence;standing Pt Will Perform Lower Body Dressing: with modified independence;sit to/from stand Pt Will Transfer to Toilet: with modified independence;ambulating;bedside commode Pt Will Perform Toileting - Clothing Manipulation and hygiene: with modified independence;sit to/from stand Additional ADL Goal #1: Pt will verbalize 2-3 Energy conservation techniques with Min VC for improved activity tolerance in ADL tasks  Plan Discharge plan remains appropriate;Frequency needs to be updated    Co-evaluation                 AM-PAC OT "6 Clicks" Daily Activity     Outcome Measure   Help from another person eating meals?: None Help from another person taking care of personal grooming?: A Little Help from another person toileting, which includes using toliet, bedpan, or urinal?: A Little Help from another person bathing (including washing, rinsing, drying)?: A Little Help from another person to put on and taking off regular upper body clothing?: None Help from another person to put on and taking off regular lower body clothing?: A Little 6 Click Score: 20    End of Session Equipment Utilized During Treatment: Rollator (4 wheels)  OT Visit Diagnosis: Unsteadiness on feet (R26.81);Pain;Muscle weakness (generalized) (M62.81) Pain - Right/Left: Right Pain - part of body: Knee   Activity Tolerance Patient tolerated treatment well   Patient Left with call bell/phone within reach;in chair;Other (comment) (mobility specialist in room)   Nurse Communication           Time: 9826-4158 OT Time Calculation (min): 29 min  Charges: OT General Charges $OT Visit: 1 Visit OT Treatments $Self Care/Home Management : 23-37 mins  Dessie Coma, M.S. OTR/L  10/11/22, 12:56 PM  ascom [phone]

## 2022-10-11 NOTE — Progress Notes (Signed)
Patient ID: Stephanie [name], female   DOB: [birth year], 76 y.o.   MRN: [id number]  Subjective: The patient notes that her right knee is feeling much better today.  She still notes mild soreness behind her knee, but feels that she is able to move her knee more freely.  She also notes that her lower leg is feeling better as well.  She denies any reinjury to the knee, and denies any numbness or paresthesias down her leg to her foot.   Objective: Vital signs in last 24 hours: Temp:  [97.4 F (36.3 C)-98.6 F (37 C)] 97.5 F (36.4 C) (12/11 0730) Pulse Rate:  [49-78] 60 (12/11 0730) Resp:  [16-17] 17 (12/11 0730) BP: (124-173)/(55-98) 173/85 (12/11 0730) SpO2:  [96 %-100 %] 100 % (12/11 0730)  Intake/Output from previous day: No intake/output data recorded. Intake/Output this shift: No intake/output data recorded.  Recent Labs    10/08/22 1909 10/09/22 0436 10/10/22 0407  HGB 13.6 11.4* 11.7*   Recent Labs    10/09/22 0436 10/10/22 0407  WBC 6.4 5.8  RBC 3.74* 3.91  HCT 35.6* 36.9  PLT 243 271   Recent Labs    10/09/22 0718 10/10/22 0407  NA 140 137  K 3.9 4.4  CL 104 104  CO2 30 28  BUN 19 20  CREATININE 0.80 0.61  GLUCOSE 89 97  CALCIUM 9.1 8.5*   No results for input(s): "LABPT", "INR" in the last 72 hours.  Physical Exam: Orthopedic examination again is limited to the right lower extremity and foot.  Skin inspection around the right knee is unremarkable.  No swelling, erythema, ecchymosis, abrasions, or other skin abnormalities are identified.  She does not appear to have any knee effusion today.  There is no tenderness to palpation over the medial or lateral aspects of the knee, nor does she have any peripatellar tenderness.  Actively, she is able to extend her knee to within 10 degrees of full extension and flex knee beyond 90 degrees, all without pain.  She is neurovascularly intact to the right lower extremity and foot.  Assessment: Right knee effusion with  underlying degenerative joint disease, improved symptomatically.  Plan: The treatment options are reviewed with the patient.  The patient notes that her knee is feeling much better today.  She was able to ambulate in the halls yesterday with physical therapy.  She is scheduled to undergo an ultrasound-guided aspiration of the Baker's cyst behind her right knee this morning.  The patient is feeling better already, so I will leave it up to her as to whether she would like to proceed with this procedure.  Thank you for asking me to participate in the care of this most delightful woman.  I will sign off at this time.  The patient may follow-up in my office on an as necessary basis.  If you have further need of orthopedic input, please reconsult me.   Marshall Cork Equan Cogbill 10/11/2022, 8:03 AM

## 2022-10-11 NOTE — Discharge Summary (Signed)
Physician Discharge Summary   Stephanie [name]  female DOB: [birth year]  DUK:[id number]  PCP: [name], MD  Admit date: 10/08/2022 Discharge date: 10/11/2022  Admitted From: home Disposition:  home Daughter updated on the phone prior to discharge. Home Health: Yes CODE STATUS: Full code   Hospital Course:  For full details, please see H&P, progress notes, consult notes and ancillary notes.  Briefly,  [name] is a 76 y.o. female with medical history significant of HFrEF, chronic hypoxic respiratory failure on 2 L, Parkinson's, type 2 diabetes, hypertension, hypothyroidism, who presented to the ED with complaints of right knee pain.    * Effusion of right knee with underlying degenerative joint disease  Patient presenting with approximately 3-day history of right knee pain with gradual worsening leading to inability to bear weight.  Dopplers negative for DVT, however large Baker's cyst noted.  xray showed Tricompartmental spurring and mild loss of articular space in the patellofemoral joint and Suspected small knee effusion. --Ortho Dr. Roland Rack attempted right knee aspiration on 12/9, but no fluid return.  Kenalog injection into the knee was done. --IR aspirated Baker's cyst. --PT eval found pt at her baseline walking with walker, and rec home with [facility].  Daughter agreed.   Chronic HFrEF (heart failure with reduced ejection fraction) (HCC) --on home torsemide 40 mg daily PRN. CXR appears improved compared to prior and lung examination is clear.  - s/p Lasix 60 mg IV one time --cont Toprol   Parkinson's disease - cont home medications, including Carbidopa-Levodopa, Entacapone, and Pramipexole    Type 2 diabetes mellitus ([place]) --recent A1c 6.8, well controlled --cont home metformin   Chronic hypoxic respiratory failure ([place]) on 2L PRN Secondary to HFrEF. Patient oxygenating well on room air, however endorsing mild SOB. Examination and CXR reassuring.   --cont home 2L O2   Weight loss Per chart review, significant weight loss over the past 18 months, approximately 60 lbs.   - Recommend PCP follow with age-appropriate cancer screening  BLE erythema, chronic --not concerning for cellulitis   Discharge Diagnoses:  Principal Problem:   Effusion of right knee Active Problems:   HFrEF (heart failure with reduced ejection fraction) (HCC)   Parkinson's disease   Type 2 diabetes mellitus (HCC)   Chronic hypoxic respiratory failure (HCC)   Weight loss   Right knee pain   30 Day Unplanned Readmission Risk Score    Flowsheet Row ED to Hosp-Admission (Current) from 10/08/2022 in [facility] (1A)  30 Day Unplanned Readmission Risk Score (%) 22.3 Filed at 10/11/2022 1600       This score is the patient's risk of an unplanned readmission within 30 days of being discharged (0 -100%). The score is based on dignosis, age, lab data, medications, orders, and past utilization.   Low:  0-14.9   Medium: 15-21.9   High: 22-29.9   Extreme: 30 and above         Discharge Instructions:  Allergies as of 10/11/2022   No Known Allergies      Medication List     TAKE these medications    acetaminophen 500 MG tablet Commonly known as: TYLENOL Take 2 tablets (1,000 mg total) by mouth 3 (three) times daily as needed for mild pain, moderate pain or headache.   allopurinol 100 MG tablet Commonly known as: ZYLOPRIM Take 100 mg by mouth daily.   aspirin EC 81 MG tablet Take 1 tablet (81 mg total) by mouth daily.  busPIRone 10 MG tablet Commonly known as: BUSPAR Take 10 mg by mouth 2 (two) times daily.   carbidopa-levodopa 25-100 MG tablet Commonly known as: SINEMET IR Take 2.5 tablets by mouth 4 (four) times daily.   carbidopa-levodopa 50-200 MG tablet Commonly known as: SINEMET CR Take 1 tablet by mouth at bedtime.   diclofenac Sodium 1 % Gel Commonly known as: VOLTAREN Apply 4 g topically 4  (four) times daily.   entacapone 200 MG tablet Commonly known as: COMTAN Take 100 mg by mouth 3 (three) times daily.   fluticasone 50 MCG/ACT nasal spray Commonly known as: FLONASE Place 1-2 sprays into the nose daily as needed for allergies.   FreeStyle Libre 14 Day Sensor Misc by Does not apply route.   gabapentin 300 MG capsule Commonly known as: NEURONTIN Take 300 mg by mouth at bedtime.   glucose 4 GM chewable tablet Chew 1 tablet by mouth as needed for low blood sugar.   levothyroxine 75 MCG tablet Commonly known as: SYNTHROID Take 75 mcg by mouth daily.   losartan 100 MG tablet Commonly known as: COZAAR Take 1 tablet (100 mg total) by mouth daily.   metFORMIN 1000 MG tablet Commonly known as: GLUCOPHAGE Take 1,000 mg by mouth 2 (two) times daily with a meal.   metoprolol succinate 50 MG 24 hr tablet Commonly known as: TOPROL-XL Take 50 mg by mouth daily. Take with or immediately following a meal.   multivitamin with minerals Tabs tablet Take 1 tablet by mouth daily.   oxyCODONE-acetaminophen 5-325 MG tablet Commonly known as: PERCOCET/ROXICET Take 1 tablet by mouth every 6 (six) hours as needed for up to 5 days for severe pain.   pantoprazole 40 MG tablet Commonly known as: PROTONIX Take 40 mg by mouth daily.   potassium chloride 10 MEQ tablet Commonly known as: KLOR-CON Take 10 mEq by mouth once daily as needed with torsemide.   pramipexole 0.125 MG tablet Commonly known as: MIRAPEX Take 0.125 mg by mouth 4 (four) times daily.   pravastatin 40 MG tablet Commonly known as: PRAVACHOL Take 40 mg by mouth daily.   QUEtiapine 100 MG tablet Commonly known as: SEROQUEL Take 100 mg by mouth at bedtime.   sertraline 25 MG tablet Commonly known as: ZOLOFT Take 1 tablet (25 mg total) by mouth daily.   tiZANidine 4 MG tablet Commonly known as: ZANAFLEX Take 4 mg by mouth 3 (three) times daily. Take for 10 days.   torsemide 20 MG tablet Commonly  known as: DEMADEX Take 20 mg by mouth daily as needed (fluid).         Follow-up Information     Poggi, Marshall Cork, MD .   Specialty: Orthopedic Surgery Contact information: [place] [id number] [phone]         [name], MD Follow up in 1 week(s).   Specialty: Internal Medicine Contact information: [place] [address] [phone]                 No Known Allergies   The results of significant diagnostics from this hospitalization (including imaging, microbiology, ancillary and laboratory) are listed below for reference.   Consultations:   Procedures/Studies: [place] DRAIN/INJ INTER JOINT/BURSA  Result Date: 10/11/2022 INDICATION: Right knee pain, right knee Baker's cyst EXAM: Ultrasound-guided aspiration of right knee Baker's cyst MEDICATIONS: Refer to EMR ANESTHESIA/SEDATION: Local analgesia COMPLICATIONS: None immediate. PROCEDURE: Informed written consent was obtained from the patient after  a thorough discussion of the procedural risks, benefits and alternatives. All questions were addressed. Maximal Sterile Barrier Technique was utilized including caps, mask, sterile gowns, sterile gloves, sterile drape, hand hygiene and skin antiseptic. A timeout was performed prior to the initiation of the procedure. The patient was placed supine on the exam table. Ultrasound of the right popliteal fossa demonstrated small anechoic Baker's cyst. Skin entry site was marked, and the overlying skin was prepped draped in the standard sterile fashion. Local analgesia was obtained with 1% lidocaine. Using ultrasound guidance, a 19 gauge Yueh catheter was advanced into the right knee Baker's cyst. Approximately 7 mL of pink serous fluid was aspirated with near complete decompression of the cavity. Sample was sent for microbiology analysis. A clean dressing was placed after hemostasis. The patient tolerated the  procedure well without immediate complication. IMPRESSION: Successful ultrasound-guided aspiration of right knee Baker's cyst with removal of 7 mL pink serous fluid. Sample was sent for microbiology analysis. Electronically Signed   By: Albin Felling M.D.   On: 10/11/2022 14:42   DG Chest 1 View  Result Date: 10/08/2022 CLINICAL DATA:  Shortness of breath EXAM: CHEST  1 VIEW COMPARISON:  02/13/2022, CT 07/22/2021, chest x-ray 08/31/2021, 07/06/2021 FINDINGS: Cardiomegaly. Underlying chronic interstitial opacities. Mild diffuse increased interstitial densities. Atelectasis or scarring at the mid to lower lungs. No pleural effusion or pneumothorax. IMPRESSION: Cardiomegaly with mild diffuse increased interstitial densities which may be due to mild edema or inflammatory process superimposed on underlying chronic disease. Electronically Signed   By: Donavan Foil M.D.   On: 10/08/2022 21:28   DG Knee 2 Views Right  Result Date: 10/08/2022 CLINICAL DATA:  Right knee pain and swelling for 2 days EXAM: RIGHT KNEE - 1-2 VIEW COMPARISON:  None Available. FINDINGS: Tricompartmental spurring loss of articular space in the patellofemoral joint. Suspected small knee effusion although the suprapatellar bursa is ill-defined. Faint linear calcifications along the menisci may reflect subtle chondrocalcinosis. Subcutaneous edema noted in the distal thigh, knee region, calf. Chronically fragmented distal quadriceps osteophyte along the patella. IMPRESSION: 1. Tricompartmental spurring and mild loss of articular space in the patellofemoral joint. 2. Suspected small knee effusion. 3. Subcutaneous edema in the distal thigh, knee region, and calf. 4. Chronically fragmented distal quadriceps osteophyte along the patella. Electronically Signed   By: Van Clines M.D.   On: 10/08/2022 17:57   US Venous Img Lower Unilateral Right  Result Date: 10/08/2022 CLINICAL DATA:  Right lower extremity pain and swelling EXAM: RIGHT  LOWER EXTREMITY VENOUS DOPPLER ULTRASOUND TECHNIQUE: Gray-scale sonography with compression, as well as color and duplex ultrasound, were performed to evaluate the deep venous system(s) from the level of the common femoral vein through the popliteal and proximal calf veins. COMPARISON:  07/21/2021 FINDINGS: VENOUS Normal compressibility of the common femoral, superficial femoral, and popliteal veins, as well as the visualized calf veins, although the peroneal vein could not be visualized. Visualized portions of profunda femoral vein and great saphenous vein unremarkable. No filling defects to suggest DVT on grayscale or color Doppler imaging. Doppler waveforms show normal direction of venous flow, normal respiratory plasticity and response to augmentation. Limited views of the contralateral common femoral vein are unremarkable. OTHER Baker cyst in the right popliteal fossa, measuring 4.3 x 3.1 x 1.1 cm Limitations: Peroneal vein could not be visualized. IMPRESSION: 1. No evidence of deep venous thrombosis in the right lower extremity, although the right peroneal vein could not be visualized RO but. 2. 4.3 x 3.1  x 1.1 cm Baker cyst in the right popliteal fossa. Negative. Electronically Signed   By: Merilyn Baba M.D.   On: 10/08/2022 14:12      Labs: BNP (last 3 results) Recent Labs    02/15/22 0341 02/16/22 0059 10/09/22 0436  BNP 673.6* 1,062.1* 588.3*   Basic Metabolic Panel: Recent Labs  Lab 10/08/22 1954 10/09/22 0718 10/10/22 0407  NA 139 140 137  K 3.8 3.9 4.4  CL 105 104 104  CO2 '26 30 28  '$ GLUCOSE 105* 89 97  BUN '20 19 20  '$ CREATININE 0.66 0.80 0.61  CALCIUM 9.0 9.1 8.5*  MG  --   --  1.8   Liver Function Tests: No results for input(s): "AST", "ALT", "ALKPHOS", "BILITOT", "PROT", "ALBUMIN" in the last 168 hours. No results for input(s): "LIPASE", "AMYLASE" in the last 168 hours. No results for input(s): "AMMONIA" in the last 168 hours. CBC: Recent Labs  Lab 10/08/22 1909  10/09/22 0436 10/10/22 0407  WBC 6.7 6.4 5.8  NEUTROABS 3.9  --   --   HGB 13.6 11.4* 11.7*  HCT 43.3 35.6* 36.9  MCV 95.6 95.2 94.4  PLT 252 243 271   Cardiac Enzymes: No results for input(s): "CKTOTAL", "CKMB", "CKMBINDEX", "TROPONINI" in the last 168 hours. BNP: Invalid input(s): "POCBNP" CBG: Recent Labs  Lab 10/09/22 1856  GLUCAP 238*   D-Dimer No results for input(s): "DDIMER" in the last 72 hours. Hgb A1c Recent Labs    10/09/22 0436  HGBA1C 6.3*   Lipid Profile No results for input(s): "CHOL", "HDL", "LDLCALC", "TRIG", "CHOLHDL", "LDLDIRECT" in the last 72 hours. Thyroid function studies Recent Labs    10/09/22 0436  TSH 5.291*   Anemia work up No results for input(s): "VITAMINB12", "FOLATE", "FERRITIN", "TIBC", "IRON", "RETICCTPCT" in the last 72 hours. Urinalysis    Component Value Date/Time   COLORURINE AMBER (A) 02/12/2022 1940   APPEARANCEUR HAZY (A) 02/12/2022 1940   LABSPEC >1.046 (H) 02/12/2022 1940   PHURINE 5.0 02/12/2022 1940   GLUCOSEU NEGATIVE 02/12/2022 1940   HGBUR NEGATIVE 02/12/2022 1940   BILIRUBINUR NEGATIVE 02/12/2022 1940   KETONESUR 5 (A) 02/12/2022 1940   PROTEINUR 30 (A) 02/12/2022 1940   NITRITE NEGATIVE 02/12/2022 1940   LEUKOCYTESUR NEGATIVE 02/12/2022 1940   Sepsis Labs Recent Labs  Lab 10/08/22 1909 10/09/22 0436 10/10/22 0407  WBC 6.7 6.4 5.8   Microbiology No results found for this or any previous visit (from the past 240 hour(s)).   Total time spend on discharging this patient, including the last patient exam, discussing the hospital stay, instructions for ongoing care as it relates to all pertinent caregivers, as well as preparing the medical discharge records, prescriptions, and/or referrals as applicable, is 45 minutes.    Enzo Bi, MD  [facility] 10/11/2022, 4:27 PM

## 2022-10-11 NOTE — Progress Notes (Signed)
Mobility Specialist - Progress Note     10/11/22 1203  Mobility  Activity Ambulated with assistance in hallway  Level of Assistance Contact guard assist, steadying assist  Assistive Device Front wheel walker  Distance Ambulated (ft) 60 ft  Activity Response Tolerated well  Mobility Referral Yes  $Mobility charge 1 Mobility   Pt resting in chair on 2L upon entry. Pt STS MinA and ambulates CGA in the hallway. Pt complained of lightheadedness initially when standing but, pt still motivated to participate in ambulation. Pt took a 1x rest break until lightheadedness subsided and said she felt much better. Pt began walking in wall and at 60 ft felt light headed again. Author initiated immediate seated rest break in roll-aid walker and wheeled pt back to chair in room. RN Notified. Pt left in recliner with needs in reach and chair alarm activated. Pt family member present in room.   Loma Sender Mobility Specialist 10/11/22, 12:08 PM

## 2022-10-11 NOTE — Progress Notes (Signed)
DISCHARGE NOTE:  Pt given discharge instructions, pt verbalized understanding. Belongs sent with pt. Pt wheeled to car by staff, Daughter providing transportation.

## 2022-10-11 NOTE — Plan of Care (Signed)
  Problem: Skin Integrity: Goal: Risk for impaired skin integrity will decrease Outcome: Progressing   Problem: Activity: Goal: Risk for activity intolerance will decrease Outcome: Progressing   Problem: Nutrition: Goal: Adequate nutrition will be maintained Outcome: Progressing   Problem: Pain Managment: Goal: General experience of comfort will improve Outcome: Progressing   Problem: Safety: Goal: Ability to remain free from injury will improve Outcome: Progressing

## 2022-10-11 NOTE — Progress Notes (Signed)
Pts BP 167/70, MD notified per MD, pt ok to discharge

## 2022-10-11 NOTE — Progress Notes (Signed)
Physical Therapy Treatment Patient Details Name: Stephanie [name] MRN: [id number] DOB: 1946/10/23 Today's Date: 10/11/2022   History of Present Illness Stephanie [name] is a 76 y.o. female with medical history significant of HFrEF, chronic hypoxic respiratory failure on 2 L, Parkinson's, type 2 diabetes, hypertension, hypothyroidism, who presents to the ED with complaints of right knee pain. Right lower extremity Doppler negative for DVT but notable for Baker's cyst measuring 4.3 x 3.1 x 1.1 centimeters.  Right knee x-ray was obtained that demonstrated tricompartmental spurring and mild loss of the articular space in the patellofemoral joint, small knee effusion and subcutaneous edema.  Due to inability to ambulate,    PT Comments    Pt resting in bed upon PT arrival; pt requesting to get up to ambulate.  8/10 R posterior knee pain beginning/during/end of session--nurse notified regarding pt's request for pain meds.  During session pt modified independent semi-supine to sitting edge of bed; CGA to SBA with transfers; CGA to SBA ambulating 120 feet with RW; and CGA navigating 2 steps with B railings.  Pt demonstrating festinating gait utilizing rollator so switched to RW use which significantly improved pt's walking (pt reports having RW at home already); pt also noted with festinating gait with sharper/tighter turns/transfers (pt requiring increased time to take steps to complete turns/transfers on own).  Pt reporting feeling ready to discharge home today (MD Billie Ruddy was able to see pt ambulate during session).  Recommend HHPT and support at home.    Recommendations for follow up therapy are one component of a multi-disciplinary discharge planning process, led by the attending physician.  Recommendations may be updated based on patient status, additional functional criteria and insurance authorization.  Follow Up Recommendations  Home health PT     Assistance Recommended at Discharge Intermittent  Supervision/Assistance  Patient can return home with the following A little help with walking and/or transfers;A little help with bathing/dressing/bathroom;Assist for transportation;Help with stairs or ramp for entrance;Assistance with cooking/housework   Equipment Recommendations   (pt has RW at home already)    Recommendations for Other Services       Precautions / Restrictions Precautions Precautions: Fall Restrictions Weight Bearing Restrictions: No     Mobility  Bed Mobility Overal bed mobility: Modified Independent Bed Mobility: Supine to Sit     Supine to sit: Modified independent (Device/Increase time), HOB elevated     General bed mobility comments: increased effort to perform on own; use of bed rail    Transfers Overall transfer level: Needs assistance Equipment used: Rolling walker (2 wheels), Rollator (4 wheels), None Transfers: Sit to/from Stand, Bed to chair/wheelchair/BSC Sit to Stand: Min guard, Supervision   Step pivot transfers: Min guard, Supervision (stand step turn recliner to/from rollator)       General transfer comment: increased effort to stand from bed up to rollator but steady; steady standing from recliner x2 trials; increased time to perform transfers/turns d/t festinating gait    Ambulation/Gait Ambulation/Gait assistance: Min guard, Supervision Gait Distance (Feet): 120 Feet Assistive device: Rolling walker (2 wheels)   Gait velocity: decreased     General Gait Details: initially festinating gait using rollator (x5 feet)--pt locking rollator, taking a couple steps, and repeating sequence-- so switched to RW and pt with improved step through gait pattern   Stairs Stairs: Yes Stairs assistance: Min guard Stair Management: Two rails, Step to pattern, Forwards Number of Stairs: 2 General stair comments: steady safe stairs navigation   Wheelchair Mobility    Modified  [place] (Stroke Patients Only)       Balance Overall  balance assessment: Needs assistance Sitting-balance support: No upper extremity supported, Feet supported Sitting balance-Leahy Scale: Good Sitting balance - Comments: steady sitting reaching within BOS   Standing balance support: Single extremity supported Standing balance-Leahy Scale: Fair Standing balance comment: steady static standing with at least single UE support                            Cognition Arousal/Alertness: Awake/alert Behavior During Therapy: WFL for tasks assessed/performed Overall Cognitive Status: Within Functional Limits for tasks assessed                                          Exercises      General Comments  Nursing cleared pt for participation in physical therapy.  Pt agreeable to PT session.      Pertinent Vitals/Pain Pain Assessment Pain Assessment: 0-10 Pain Score: 8  Pain Location: R knee (posterior) Pain Descriptors / Indicators: Discomfort, Sore Pain Intervention(s): Limited activity within patient's tolerance, Monitored during session, Repositioned, Patient requesting pain meds-RN notified Vitals (HR and O2 on 2 L via nasal cannula) stable and WFL throughout treatment session.    Home Living                          Prior Function            PT Goals (current goals can now be found in the care plan section) Acute Rehab PT Goals Patient Stated Goal: go home with HHPT PT Goal Formulation: With patient Time For Goal Achievement: 10/23/22 Potential to Achieve Goals: Good Progress towards PT goals: Progressing toward goals    Frequency    Min 2X/week      PT Plan Current plan remains appropriate    Co-evaluation              AM-PAC PT "6 Clicks" Mobility   Outcome Measure  Help needed turning from your back to your side while in a flat bed without using bedrails?: None Help needed moving from lying on your back to sitting on the side of a flat bed without using bedrails?:  None Help needed moving to and from a bed to a chair (including a wheelchair)?: A Little Help needed standing up from a chair using your arms (e.g., wheelchair or bedside chair)?: A Little Help needed to walk in hospital room?: A Little Help needed climbing 3-5 steps with a railing? : A Little 6 Click Score: 20    End of Session Equipment Utilized During Treatment: Gait belt;Oxygen (2 L via nasal cannula) Activity Tolerance: Patient tolerated treatment well Patient left: in chair;with call bell/phone within reach;with chair alarm set Nurse Communication: Mobility status;Patient requests pain meds;Precautions PT Visit Diagnosis: Unsteadiness on feet (R26.81);Muscle weakness (generalized) (M62.81);Difficulty in walking, not elsewhere classified (R26.2);History of falling (Z91.81);Other abnormalities of gait and mobility (R26.89)     Time: 1093-2355 PT Time Calculation (min) (ACUTE ONLY): 35 min  Charges:  $Gait Training: 8-22 mins $Therapeutic Activity: 8-22 mins                     Leitha Bleak, PT 10/11/22, 5:08 PM

## 2022-10-11 NOTE — Plan of Care (Signed)

## 2022-10-16 LAB — AEROBIC/ANAEROBIC CULTURE W GRAM STAIN (SURGICAL/DEEP WOUND)
Culture: NO GROWTH
Gram Stain: NONE SEEN

## 2022-12-07 DIAGNOSIS — M79669 Pain in unspecified lower leg: Secondary | ICD-10-CM | POA: Insufficient documentation

## 2022-12-07 NOTE — Progress Notes (Signed)
MRN : [id number]  Stephanie [name] is a 77 y.o. (1946-01-30) female who presents with chief complaint of check circulation.  History of Present Illness:   Patient is seen for evaluation of leg pain and swelling associated with new onset ulceration. The patient first noticed the swelling remotely. The swelling is associated with pain and discoloration. The pain and swelling worsens with prolonged dependency and improves with elevation. The pain is unrelated to activity.  The patient notes that in the morning the legs are better but the leg symptoms worsened throughout the course of the day. The patient has also noted a progressive worsening of the discoloration in the ankle and shin area.   The patient notes that an ulcer has developed acutely without specific trauma and since it occurred it has been very slow to heal.  There is a moderate amount of drainage associated with the open area.  The wound is also very painful.  The patient denies claudication symptoms or rest pain symptoms.   The patient has not had any past angiography, interventions or vascular surgery.  Elevation makes the leg symptoms better, dependency makes them much worse. The patient denies any recent changes in medications.  The patient has been getting Unna boots done by her daughter.  Spite this the patient has multiple ulcerations with foul-smelling drainage present.  The patient denies a history of DVT or PE. There is no prior history of phlebitis. There is no history of primary lymphedema.  No history of malignancies. No history of trauma or groin or pelvic surgery. There is no history of radiation treatment to the groin or pelvis    Today the patient has an ABI of 1.09 on the right and 1.04 on the left.  Normal TBI's bilaterally with triphasic tibial artery waveforms and normal toe waveforms bilaterally.     No outpatient medications have been marked as taking for the 12/09/22 encounter  (Appointment) with Delana Meyer, Dolores Lory, MD.    Past Medical History:  Diagnosis Date   Anemia    Arthritis    CHF (congestive heart failure) ([place])    Chronic back pain    Diabetes mellitus without complication ([place])    Dysrhythmia    GERD (gastroesophageal reflux disease)    Hyperlipidemia    Hypertension    Hypothyroid    Multilevel degenerative disc disease    Parkinson's disease ([place])    Sleep apnea    Syncope    Tremors of nervous system    Wears dentures    full upper and lower    Past Surgical History:  Procedure Laterality Date   ABDOMINAL HYSTERECTOMY     BACK SURGERY  09/16   CARDIAC CATHETERIZATION     CATARACT EXTRACTION W/PHACO Right 11/24/2015   Procedure: CATARACT EXTRACTION PHACO AND INTRAOCULAR LENS PLACEMENT ([place]);  Surgeon: Estill Cotta, MD;  Location: [facility];  Service: Ophthalmology;  Laterality: Right;  [place] 01:14 AP% 25.5 CDE 30.69 fluid pack lot # [id number]   CATARACT EXTRACTION W/PHACO Left 01/06/2021   Procedure: CATARACT EXTRACTION PHACO AND INTRAOCULAR LENS PLACEMENT ([facility]) LEFT DIABETIC 6.98 00:40.1;  Surgeon: Birder Robson, MD;  Location: [facility];  Service: Ophthalmology;  Laterality: Left;  Diabetic - oral meds   COLONOSCOPY     COLONOSCOPY WITH PROPOFOL N/A 07/24/2019   Procedure: COLONOSCOPY WITH PROPOFOL;  Surgeon: Toledo, Benay Pike, MD;  Location: [facility];  Service: Gastroenterology;  Laterality: N/A;  ESOPHAGOGASTRODUODENOSCOPY N/A 07/24/2019   Procedure: ESOPHAGOGASTRODUODENOSCOPY (EGD);  Surgeon: Toledo, Benay Pike, MD;  Location: [facility];  Service: Gastroenterology;  Laterality: N/A;   OOPHORECTOMY      Social History Social History   Tobacco Use   Smoking status: Never   Smokeless tobacco: Never  Vaping Use   Vaping Use: Never used  Substance Use Topics   Alcohol use: Not Currently    Comment: rare   Drug use: No    Family History Family History  Problem Relation Age of Onset   Breast cancer  Paternal Aunt     No Known Allergies   REVIEW OF SYSTEMS (Negative unless checked)  Constitutional: '[]'$ Weight loss  '[]'$ Fever  '[]'$ Chills Cardiac: '[]'$ Chest pain   '[]'$ Chest pressure   '[]'$ Palpitations   '[]'$ Shortness of breath when laying flat   '[]'$ Shortness of breath with exertion. Vascular:  '[]'$ Pain in legs with walking   '[]'$ Pain in legs at rest  '[]'$ History of DVT   '[]'$ Phlebitis   '[x]'$ Swelling in legs   '[]'$ Varicose veins   '[x]'$ Non-healing ulcers Pulmonary:   '[]'$ Uses home oxygen   '[]'$ Productive cough   '[]'$ Hemoptysis   '[]'$ Wheeze  '[]'$ COPD   '[]'$ Asthma Neurologic:  '[]'$ Dizziness   '[]'$ Seizures   '[]'$ History of stroke   '[]'$ History of TIA  '[]'$ Aphasia   '[]'$ Vissual changes   '[]'$ Weakness or numbness in arm   '[]'$ Weakness or numbness in leg Musculoskeletal:   '[]'$ Joint swelling   '[]'$ Joint pain   '[]'$ Low back pain Hematologic:  '[]'$ Easy bruising  '[]'$ Easy bleeding   '[]'$ Hypercoagulable state   '[]'$ Anemic Gastrointestinal:  '[]'$ Diarrhea   '[]'$ Vomiting  '[]'$ Gastroesophageal reflux/heartburn   '[]'$ Difficulty swallowing. Genitourinary:  '[]'$ Chronic kidney disease   '[]'$ Difficult urination  '[]'$ Frequent urination   '[]'$ Blood in urine Skin:  '[]'$ Rashes   '[x]'$ Ulcers  Psychological:  '[]'$ History of anxiety   '[]'$  History of major depression.  Physical Examination  There were no vitals filed for this visit. There is no height or weight on file to calculate BMI. Gen: WD/WN, NAD Head: /AT, No temporalis wasting.  Ear/Nose/Throat: Hearing grossly intact, nares w/o erythema or drainage Eyes: PER, EOMI, sclera nonicteric.  Neck: Supple, no masses.  No bruit or JVD.  Pulmonary:  Good air movement, no audible wheezing, no use of accessory muscles.  Cardiac: RRR, normal S1, S2, no Murmurs. Vascular:  mild trophic changes, no open wounds Vessel Right Left  Radial Palpable Palpable  PT Not Palpable Not Palpable  DP Not Palpable Not Palpable  Gastrointestinal: soft, non-distended. No guarding/no peritoneal signs.  Musculoskeletal: M/S 5/5 throughout.  No visible deformity.   Neurologic: CN 2-12 intact. Pain and light touch intact in extremities.  Symmetrical.  Speech is fluent. Motor exam as listed above. Psychiatric: Judgment intact, Mood & affect appropriate for pt's clinical situation. Dermatologic: No rashes or ulcers noted.  No changes consistent with cellulitis.   CBC Lab Results  Component Value Date   WBC 5.8 10/10/2022   HGB 11.7 (L) 10/10/2022   HCT 36.9 10/10/2022   MCV 94.4 10/10/2022   PLT 271 10/10/2022    BMET    Component Value Date/Time   NA 137 10/10/2022 0407   K 4.4 10/10/2022 0407   CL 104 10/10/2022 0407   CO2 28 10/10/2022 0407   GLUCOSE 97 10/10/2022 0407   BUN 20 10/10/2022 0407   CREATININE 0.61 10/10/2022 0407   CREATININE 0.96 02/27/2012 0836   CALCIUM 8.5 (L) 10/10/2022 0407   GFRNONAA >60 10/10/2022 0407   GFRNONAA >60 02/27/2012 0836   GFRAA >60  01/02/2016 0756   GFRAA >60 02/27/2012 0836   CrCl cannot be calculated (Patient's most recent lab result is older than the maximum 21 days allowed.).  COAG Lab Results  Component Value Date   INR 1.3 (H) 02/12/2022   INR 1.2 07/21/2021   INR 1.2 07/06/2021    Radiology No results found.   Assessment/Plan 1. Hypertension associated with diabetes ([place]) Continue antihypertensive medications as already ordered, these medications have been reviewed and there are no changes at this time.  2. Type 2 diabetes mellitus with other specified complication, without long-term current use of insulin ([facility]) Continue hypoglycemic medications as already ordered, these medications have been reviewed and there are no changes at this time.  Hgb A1C to be monitored as already arranged by primary service  3. Hyperlipidemia associated with type 2 diabetes mellitus ([place]) Continue statin as ordered and reviewed, no changes at this time  4. Venous ulcer ([place]) We will culture the wound today.  We will also prescribe ciprofloxacin for the infection.  The patient is also advised to  continue wrapping her legs with Unna boots.  Will plan on reevaluating in 2 weeks to evaluate progression with wound healing.    Hortencia Pilar, MD  12/07/2022 8:49 PM

## 2022-12-08 ENCOUNTER — Other Ambulatory Visit (INDEPENDENT_AMBULATORY_CARE_PROVIDER_SITE_OTHER): Payer: Self-pay | Admitting: Nurse Practitioner

## 2022-12-08 DIAGNOSIS — I739 Peripheral vascular disease, unspecified: Secondary | ICD-10-CM

## 2022-12-09 ENCOUNTER — Encounter (INDEPENDENT_AMBULATORY_CARE_PROVIDER_SITE_OTHER): Payer: Self-pay

## 2022-12-09 ENCOUNTER — Encounter (INDEPENDENT_AMBULATORY_CARE_PROVIDER_SITE_OTHER): Payer: Self-pay | Admitting: Vascular Surgery

## 2022-12-09 ENCOUNTER — Ambulatory Visit (INDEPENDENT_AMBULATORY_CARE_PROVIDER_SITE_OTHER): Payer: Self-pay | Admitting: Nurse Practitioner

## 2022-12-09 ENCOUNTER — Other Ambulatory Visit (INDEPENDENT_AMBULATORY_CARE_PROVIDER_SITE_OTHER): Payer: Self-pay | Admitting: Nurse Practitioner

## 2022-12-09 ENCOUNTER — Ambulatory Visit (INDEPENDENT_AMBULATORY_CARE_PROVIDER_SITE_OTHER): Payer: Self-pay

## 2022-12-09 VITALS — BP 149/72 | HR 73 | Resp 15 | Wt 119.0 lb

## 2022-12-09 DIAGNOSIS — I152 Hypertension secondary to endocrine disorders: Secondary | ICD-10-CM

## 2022-12-09 DIAGNOSIS — E1159 Type 2 diabetes mellitus with other circulatory complications: Secondary | ICD-10-CM

## 2022-12-09 DIAGNOSIS — L97909 Non-pressure chronic ulcer of unspecified part of unspecified lower leg with unspecified severity: Secondary | ICD-10-CM

## 2022-12-09 DIAGNOSIS — I739 Peripheral vascular disease, unspecified: Secondary | ICD-10-CM

## 2022-12-09 DIAGNOSIS — E1169 Type 2 diabetes mellitus with other specified complication: Secondary | ICD-10-CM

## 2022-12-09 DIAGNOSIS — M79669 Pain in unspecified lower leg: Secondary | ICD-10-CM

## 2022-12-09 DIAGNOSIS — M545 Low back pain, unspecified: Secondary | ICD-10-CM

## 2022-12-09 DIAGNOSIS — I83009 Varicose veins of unspecified lower extremity with ulcer of unspecified site: Secondary | ICD-10-CM

## 2022-12-09 DIAGNOSIS — E785 Hyperlipidemia, unspecified: Secondary | ICD-10-CM

## 2022-12-09 MED ORDER — CIPROFLOXACIN HCL 250 MG PO TABS: 750.0000 mg | ORAL_TABLET | Freq: Two times a day (BID) | ORAL | 0 refills | Status: AC

## 2022-12-10 LAB — VAS US ABI WITH/WO TBI
Left ABI: 1.04
Right ABI: 1.09

## 2022-12-13 ENCOUNTER — Other Ambulatory Visit: Payer: Self-pay | Admitting: Internal Medicine

## 2022-12-13 DIAGNOSIS — R1012 Left upper quadrant pain: Secondary | ICD-10-CM

## 2022-12-15 LAB — AEROBIC CULTURE

## 2022-12-16 ENCOUNTER — Emergency Department: Payer: Medicare Other

## 2022-12-16 ENCOUNTER — Emergency Department
Admission: EM | Admit: 2022-12-16 | Discharge: 2022-12-16 | Disposition: A | Discharge status: Home / Self Care | Payer: Medicare Other | Attending: Emergency Medicine | Admitting: Emergency Medicine

## 2022-12-16 ENCOUNTER — Other Ambulatory Visit: Payer: Self-pay

## 2022-12-16 DIAGNOSIS — K5900 Constipation, unspecified: Secondary | ICD-10-CM | POA: Insufficient documentation

## 2022-12-16 DIAGNOSIS — R109 Unspecified abdominal pain: Secondary | ICD-10-CM | POA: Diagnosis present

## 2022-12-16 LAB — COMPREHENSIVE METABOLIC PANEL
ALT: 6 U/L (ref 0–44)
AST: 26 U/L (ref 15–41)
Albumin: 3.7 g/dL (ref 3.5–5.0)
Alkaline Phosphatase: 55 U/L (ref 38–126)
Anion gap: 11 (ref 5–15)
BUN: 36 mg/dL — ABNORMAL HIGH (ref 8–23)
CO2: 22 mmol/L (ref 22–32)
Calcium: 9 mg/dL (ref 8.9–10.3)
Chloride: 101 mmol/L (ref 98–111)
Creatinine, Ser: 1.06 mg/dL — ABNORMAL HIGH (ref 0.44–1.00)
GFR, Estimated: 54 mL/min — ABNORMAL LOW (ref >60)
Glucose, Bld: 92 mg/dL (ref 70–99)
Potassium: 3.9 mmol/L (ref 3.5–5.1)
Sodium: 134 mmol/L — ABNORMAL LOW (ref 135–145)
Total Bilirubin: 1 mg/dL (ref 0.3–1.2)
Total Protein: 7 g/dL (ref 6.5–8.1)

## 2022-12-16 LAB — CBC
HCT: 40.8 % (ref 36.0–46.0)
Hemoglobin: 13.2 g/dL (ref 12.0–15.0)
MCH: 30.3 pg (ref 26.0–34.0)
MCHC: 32.4 g/dL (ref 30.0–36.0)
MCV: 93.8 fL (ref 80.0–100.0)
Platelets: 252 10*3/uL (ref 150–400)
RBC: 4.35 MIL/uL (ref 3.87–5.11)
RDW: 13.9 % (ref 11.5–15.5)
WBC: 5.9 10*3/uL (ref 4.0–10.5)
nRBC: 0 % (ref 0.0–0.2)

## 2022-12-16 LAB — URINALYSIS, ROUTINE W REFLEX MICROSCOPIC
Bacteria, UA: NONE SEEN
Bilirubin Urine: NEGATIVE
Glucose, UA: NEGATIVE mg/dL
Hgb urine dipstick: NEGATIVE
Ketones, ur: 5 mg/dL — AB
Leukocytes,Ua: NEGATIVE
Nitrite: NEGATIVE
Protein, ur: 30 mg/dL — AB
Specific Gravity, Urine: 1.021 (ref 1.005–1.030)
pH: 5 (ref 5.0–8.0)

## 2022-12-16 LAB — LIPASE, BLOOD: Lipase: 44 U/L (ref 11–51)

## 2022-12-16 MED ORDER — POLYETHYLENE GLYCOL 3350 17 G PO PACK: 17.0000 g | PACK | Freq: Every day | ORAL | 0 refills | Status: DC

## 2022-12-16 MED ORDER — FENTANYL CITRATE PF 50 MCG/ML IJ SOSY
50.0000 ug | PREFILLED_SYRINGE | Freq: Once | INTRAMUSCULAR | Status: AC
  Administered 2022-12-16: 50 ug via INTRAVENOUS
  Filled 2022-12-16: qty 1

## 2022-12-16 MED ORDER — IOHEXOL 300 MG/ML  SOLN
100.0000 mL | Freq: Once | INTRAMUSCULAR | Status: AC | PRN
  Administered 2022-12-16: 100 mL via INTRAVENOUS

## 2022-12-16 MED ORDER — SODIUM CHLORIDE 0.9 % IV SOLN: Freq: Once | INTRAVENOUS | Status: AC

## 2022-12-16 MED ORDER — MAGNESIUM CITRATE PO SOLN: 1.0000 | Freq: Once | ORAL | 1 refills | Status: AC

## 2022-12-16 NOTE — ED Triage Notes (Addendum)
Pt here with a lower abd hernia with pain that radiates to her back. Pt states the pain is getting more severe and affecting her ambulation. Pt's husband just passed 30 mins ago. Pt denies N/V.  Pt was scheduled for an [place] abd later this month.

## 2022-12-16 NOTE — Discharge Instructions (Signed)
Please seek medical attention for any high fevers, chest pain, shortness of breath, change in behavior, persistent vomiting, bloody stool or any other new or concerning symptoms.  

## 2022-12-16 NOTE — ED Provider Notes (Signed)
[facility] Provider Note    Event Date/Time   First MD Initiated Contact with Patient 12/16/22 1809     (approximate)   History   Abdominal Pain   HPI  Stephanie [name] is a 77 y.o. female  who presents to the emergency department today because of concern for abdominal pain.  The patient states that the pain has been present for a couple of weeks.  It has progressively gotten worse.  Located on the left side of her abdomen.  She has not had any diarrhea.  Denies any nausea or vomiting.  Denies any fevers.  Has spoken to her primary care who is arranged an ultrasound however she felt like the pain was getting more severe.  Denies any fevers.      Physical Exam   Triage Vital Signs: ED Triage Vitals  Enc Vitals Group     BP 12/16/22 1518 (!) 184/73     Pulse Rate 12/16/22 1518 74     Resp 12/16/22 1518 18     Temp 12/16/22 1518 98.3 F (36.8 C)     Temp Source 12/16/22 1518 Oral     SpO2 12/16/22 1518 100 %     Weight 12/16/22 1519 119 lb 0.8 oz (54 kg)     Height 12/16/22 1519 5' (1.524 m)     Head Circumference --      Peak Flow --      Pain Score 12/16/22 1518 10     Pain Loc --      Pain Edu? --      Excl. in [place]? --     Most recent vital signs: Vitals:   12/16/22 1518  BP: (!) 184/73  Pulse: 74  Resp: 18  Temp: 98.3 F (36.8 C)  SpO2: 100%   General: Awake, alert, oriented. CV:  Good peripheral perfusion. Regular rate and rhythm. Resp:  Normal effort. Lungs clear. Abd:  No distention. Tender to palpation to the left abdomen.    ED Results / Procedures / Treatments   Labs (all labs ordered are listed, but only abnormal results are displayed) Labs Reviewed  COMPREHENSIVE METABOLIC PANEL - Abnormal; Notable for the following components:      Result Value   Sodium 134 (*)    BUN 36 (*)    Creatinine, Ser 1.06 (*)    GFR, Estimated 54 (*)    All other components within normal limits  LIPASE, BLOOD  CBC  URINALYSIS,  ROUTINE W REFLEX MICROSCOPIC     EKG  None   RADIOLOGY I independently interpreted and visualized the CT abd/pel. My interpretation: No free air Radiology interpretation:  IMPRESSION:  1. No evidence of bowel obstruction or inflammation. Diffusely  stool-filled colon most consistent with constipation. Clinical  correlation is suggested.  2. No specific abdominal wall hernias are demonstrated.  3. Cardiac enlargement with evidence of pulmonary edema in the lung  bases.  4. Bladder diverticula. No bladder wall thickening or filling  defects.  5. Severe degenerative changes in the spine. Diffuse bone sclerosis  may indicate metabolic disease or metastasis. Clinical correlation  is suggested. Resorption of superior endplates at 624THL progressing  since prior study, possibly degenerative or infectious.      PROCEDURES:  Critical Care performed: No  Procedures   MEDICATIONS ORDERED IN ED: Medications - No data to display   IMPRESSION / MDM / [place] / ED COURSE  I reviewed the triage vital signs and the  nursing notes.                              Differential diagnosis includes, but is not limited to, volvulus, perforation, infection.  Patient's presentation is most consistent with acute presentation with potential threat to life or bodily function.  Patient presented to the emergency department today because of concerns for abdominal pain.  On exam she is tender in the left abdomen.  Blood work without concerning leukocytosis.  Did obtain a CT scan which did not show any concerning obstruction, bowel gas pattern or infection.  Was concerning for possible constipation I do think this could explain the patient's pain.  Discussed this with the patient.  Will plan on discharging with laxatives.  Additionally discussed with patient findings of the spine with multiple lesions.  Did encourage patient follow-up with the primary care. At this time doubt infectious  process.   FINAL CLINICAL IMPRESSION(S) / ED DIAGNOSES   Final diagnoses:  Abdominal pain, unspecified abdominal location  Constipation, unspecified constipation type     Note:  This document was prepared using Dragon voice recognition software and may include unintentional dictation errors.    Nance Pear, MD 12/16/22 2236

## 2022-12-16 NOTE — ED Notes (Signed)
  Patient ambulated to commode with assistance to obtain urine specimen.  Patient tolerated well.

## 2022-12-21 ENCOUNTER — Ambulatory Visit
Admission: RE | Admit: 2022-12-21 | Discharge: 2022-12-21 | Disposition: A | Discharge status: Home / Self Care | Payer: Medicare Other | Source: Ambulatory Visit | Attending: Internal Medicine | Admitting: Internal Medicine

## 2022-12-21 DIAGNOSIS — R1012 Left upper quadrant pain: Secondary | ICD-10-CM | POA: Diagnosis present

## 2022-12-23 ENCOUNTER — Ambulatory Visit (INDEPENDENT_AMBULATORY_CARE_PROVIDER_SITE_OTHER): Payer: Medicare Other | Admitting: Nurse Practitioner

## 2022-12-23 ENCOUNTER — Encounter (INDEPENDENT_AMBULATORY_CARE_PROVIDER_SITE_OTHER): Payer: Self-pay | Admitting: Nurse Practitioner

## 2022-12-23 VITALS — BP 119/55 | HR 71 | Resp 18 | Ht 60.0 in | Wt 119.0 lb

## 2022-12-23 DIAGNOSIS — E1159 Type 2 diabetes mellitus with other circulatory complications: Secondary | ICD-10-CM

## 2022-12-23 DIAGNOSIS — I152 Hypertension secondary to endocrine disorders: Secondary | ICD-10-CM | POA: Diagnosis not present

## 2022-12-23 DIAGNOSIS — I83029 Varicose veins of left lower extremity with ulcer of unspecified site: Secondary | ICD-10-CM | POA: Diagnosis not present

## 2022-12-23 DIAGNOSIS — L97929 Non-pressure chronic ulcer of unspecified part of left lower leg with unspecified severity: Secondary | ICD-10-CM

## 2022-12-23 MED ORDER — PENICILLIN V POTASSIUM 500 MG PO TABS: 500.0000 mg | ORAL_TABLET | Freq: Three times a day (TID) | ORAL | 0 refills | Status: DC

## 2022-12-23 NOTE — Progress Notes (Signed)
Subjective:    Patient ID: Stephanie [name], female    DOB: 1946-05-18, 77 y.o.   MRN: [id number] Chief Complaint  Patient presents with   Follow-up    Follow up 2 week no studies     Stephanie [name] is a 77 year old female who returns today for follow-up evaluation of multiple wounds on her bilateral lower extremities.  Today those wounds are primarily healed except for a small 1 near the medial ankle on her left lower extremity.  She does have some significant stasis dermatitis noted bilaterally.  There is been no weeping associated from her wounds.  She currently is having Unna boots placed in on by her daughter at home.  She is tolerating this well.  Previous noninvasive study showed normal ABIs.    Review of Systems  Cardiovascular:  Positive for leg swelling.  Skin:  Positive for wound.  All other systems reviewed and are negative.      Objective:   Physical Exam Vitals reviewed.  HENT:     Head: Normocephalic.  Cardiovascular:     Rate and Rhythm: Normal rate.  Pulmonary:     Effort: Pulmonary effort is normal.  Musculoskeletal:     Left lower leg: Edema present.  Skin:    General: Skin is warm and dry.  Neurological:     Mental Status: She is alert and oriented to person, place, and time.  Psychiatric:        Mood and Affect: Mood normal.        Behavior: Behavior normal.        Thought Content: Thought content normal.        Judgment: Judgment normal.     BP (!) 119/55 (BP Location: Left Arm)   Pulse 71   Resp 18   Ht 5' (1.524 m)   Wt 119 lb (54 kg)   BMI 23.24 kg/m   Past Medical History:  Diagnosis Date   Anemia    Arthritis    CHF (congestive heart failure) (HCC)    Chronic back pain    Diabetes mellitus without complication (HCC)    Dysrhythmia    GERD (gastroesophageal reflux disease)    Hyperlipidemia    Hypertension    Hypothyroid    Multilevel degenerative disc disease    Parkinson's disease    Sleep apnea    Syncope     Tremors of nervous system    Wears dentures    full upper and lower    Social History   Socioeconomic History   Marital status: Divorced    Spouse name: Not on file   Number of children: Not on file   Years of education: Not on file   Highest education level: Not on file  Occupational History   Not on file  Tobacco Use   Smoking status: Never   Smokeless tobacco: Never  Vaping Use   Vaping Use: Never used  Substance and Sexual Activity   Alcohol use: Not Currently    Comment: rare   Drug use: No   Sexual activity: Not Currently  Other Topics Concern   Not on file  Social History Narrative   Not on file   Social Determinants of Health   Financial Resource Strain: Not on file  Food Insecurity: No Food Insecurity (10/09/2022)   Hunger Vital Sign    Worried About Running Out of Food in the Last Year: Never true    Ran Out of Food in the Last Year: Never  true  Transportation Needs: No Transportation Needs (10/09/2022)   PRAPARE - Hydrologist (Medical): No    Lack of Transportation (Non-Medical): No  Physical Activity: Not on file  Stress: Not on file  Social Connections: Not on file  Intimate Partner Violence: Not At Risk (10/09/2022)   Humiliation, Afraid, Rape, and Kick questionnaire    Fear of Current or Ex-Partner: No    Emotionally Abused: No    Physically Abused: No    Sexually Abused: No    Past Surgical History:  Procedure Laterality Date   ABDOMINAL HYSTERECTOMY     BACK SURGERY  09/16   CARDIAC CATHETERIZATION     CATARACT EXTRACTION W/PHACO Right 11/24/2015   Procedure: CATARACT EXTRACTION PHACO AND INTRAOCULAR LENS PLACEMENT ([place]);  Surgeon: Estill Cotta, MD;  Location: [facility];  Service: Ophthalmology;  Laterality: Right;  [place] 01:14 AP% 25.5 CDE 30.69 fluid pack lot # [id number]   CATARACT EXTRACTION W/PHACO Left 01/06/2021   Procedure: CATARACT EXTRACTION PHACO AND INTRAOCULAR LENS PLACEMENT ([facility]) LEFT DIABETIC 6.98  00:40.1;  Surgeon: Birder Robson, MD;  Location: [facility];  Service: Ophthalmology;  Laterality: Left;  Diabetic - oral meds   COLONOSCOPY     COLONOSCOPY WITH PROPOFOL N/A 07/24/2019   Procedure: COLONOSCOPY WITH PROPOFOL;  Surgeon: Toledo, Benay Pike, MD;  Location: [facility];  Service: Gastroenterology;  Laterality: N/A;   ESOPHAGOGASTRODUODENOSCOPY N/A 07/24/2019   Procedure: ESOPHAGOGASTRODUODENOSCOPY (EGD);  Surgeon: Toledo, Benay Pike, MD;  Location: [facility];  Service: Gastroenterology;  Laterality: N/A;   OOPHORECTOMY      Family History  Problem Relation Age of Onset   Breast cancer Paternal Aunt     No Known Allergies     Latest Ref Rng & Units 12/16/2022    3:22 PM 10/10/2022    4:07 AM 10/09/2022    4:36 AM  CBC  WBC 4.0 - 10.5 K/uL 5.9  5.8  6.4   Hemoglobin 12.0 - 15.0 g/dL 13.2  11.7  11.4   Hematocrit 36.0 - 46.0 % 40.8  36.9  35.6   Platelets 150 - 400 K/uL 252  271  243       CMP     Component Value Date/Time   NA 134 (L) 12/16/2022 1522   K 3.9 12/16/2022 1522   CL 101 12/16/2022 1522   CO2 22 12/16/2022 1522   GLUCOSE 92 12/16/2022 1522   BUN 36 (H) 12/16/2022 1522   CREATININE 1.06 (H) 12/16/2022 1522   CREATININE 0.96 02/27/2012 0836   CALCIUM 9.0 12/16/2022 1522   PROT 7.0 12/16/2022 1522   ALBUMIN 3.7 12/16/2022 1522   AST 26 12/16/2022 1522   ALT 6 12/16/2022 1522   ALKPHOS 55 12/16/2022 1522   BILITOT 1.0 12/16/2022 1522   GFRNONAA 54 (L) 12/16/2022 1522   GFRNONAA >60 02/27/2012 0836   GFRAA >60 01/02/2016 0756   GFRAA >60 02/27/2012 0836     VAS [place] ABI WITH/WO TBI  Result Date: 12/10/2022  LOWER EXTREMITY DOPPLER STUDY Patient Name:  Stephanie [name]  Date of Exam:   12/09/2022 Medical Rec #: [id number]           Accession #:    [id number] Date of Birth: 1945/11/11          Patient Gender: F Patient Age:   60 years Exam Location:  [facility] Procedure:      VAS [place] ABI WITH/WO TBI Referring Phys:  Eulogio Ditch --------------------------------------------------------------------------------  Indications: Ulceration. High Risk Factors: Hypertension, Diabetes, no history of smoking.  Performing Technologist: Delorise Shiner RVT  Examination Guidelines: A complete evaluation includes at minimum, Doppler waveform signals and systolic blood pressure reading at the level of bilateral brachial, anterior tibial, and posterior tibial arteries, when vessel segments are accessible. Bilateral testing is considered an integral part of a complete examination. Photoelectric Plethysmograph (PPG) waveforms and toe systolic pressure readings are included as required and additional duplex testing as needed. Limited examinations for reoccurring indications may be performed as noted.  ABI Findings: +---------+------------------+-----+---------+--------+ Right    Rt Pressure (mmHg)IndexWaveform Comment  +---------+------------------+-----+---------+--------+ Brachial 185                                      +---------+------------------+-----+---------+--------+ PTA      201               1.09 triphasic         +---------+------------------+-----+---------+--------+ DP       197               1.06 triphasic         +---------+------------------+-----+---------+--------+ Great Toe130               0.70                   +---------+------------------+-----+---------+--------+ +---------+------------------+-----+---------+-------+ Left     Lt Pressure (mmHg)IndexWaveform Comment +---------+------------------+-----+---------+-------+ Brachial 183                                     +---------+------------------+-----+---------+-------+ PTA      166               0.90 triphasic        +---------+------------------+-----+---------+-------+ DP       192               1.04 triphasic        +---------+------------------+-----+---------+-------+ [place]               0.74                   +---------+------------------+-----+---------+-------+ +-------+-----------+-----------+------------+------------+ ABI/TBIToday's ABIToday's TBIPrevious ABIPrevious TBI +-------+-----------+-----------+------------+------------+ Right  1.09       0.70                                +-------+-----------+-----------+------------+------------+ Left   1.04       0.74                                +-------+-----------+-----------+------------+------------+  Summary: Right: Resting right ankle-brachial index is within normal range. The right toe-brachial index is normal. Left: Resting left ankle-brachial index is within normal range. The left toe-brachial index is normal. *See table(s) above for measurements and observations.  Electronically signed by Hortencia Pilar MD on 12/10/2022 at 11:07:18 AM.    Final        Assessment & Plan:   1. Venous ulcer of left leg ([place]) The patient has done well with Unna boots done by her daughter.  I recommended that they continue them on the left leg as there is still a small wound.  We will send in penicillin.  As the  patient's culture showed 2 different bacteria.  Will plan to have her return in 4 weeks for noninvasive studies.  The patient is also advised to continue with elevation and activity to help with her swelling.  I have discussed that when she returns for follow-up a lymphedema pump may also be helpful pending how her swelling is doing.  She is also reminded to wear some sort of compression on her right lower extremity.  2. Hypertension associated with diabetes ([place]) Continue antihypertensive medications as already ordered, these medications have been reviewed and there are no changes at this time.   Current Outpatient Medications on File Prior to Visit  Medication Sig Dispense Refill   acetaminophen (TYLENOL) 500 MG tablet Take 2 tablets (1,000 mg total) by mouth 3 (three) times daily as needed for mild pain, moderate pain or  headache.  0   allopurinol (ZYLOPRIM) 100 MG tablet Take 100 mg by mouth daily.     aspirin EC 81 MG tablet Take 1 tablet (81 mg total) by mouth daily. 30 tablet 0   busPIRone (BUSPAR) 10 MG tablet Take 10 mg by mouth 2 (two) times daily.     carbidopa-levodopa (SINEMET CR) 50-200 MG tablet Take 1 tablet by mouth at bedtime.     carbidopa-levodopa (SINEMET IR) 25-100 MG tablet Take 2.5 tablets by mouth 4 (four) times daily.     Continuous Blood Gluc Sensor (FREESTYLE LIBRE 14 DAY SENSOR) MISC by Does not apply route.     diclofenac Sodium (VOLTAREN) 1 % GEL Apply 4 g topically 4 (four) times daily. 4 g 0   entacapone (COMTAN) 200 MG tablet Take 100 mg by mouth 3 (three) times daily.     fluticasone (FLONASE) 50 MCG/ACT nasal spray Place 1-2 sprays into the nose daily as needed for allergies.     gabapentin (NEURONTIN) 300 MG capsule Take 300 mg by mouth at bedtime.     glucose 4 GM chewable tablet Chew 1 tablet by mouth as needed for low blood sugar.     metFORMIN (GLUCOPHAGE) 1000 MG tablet Take 1,000 mg by mouth 2 (two) times daily with a meal.     metoprolol succinate (TOPROL-XL) 50 MG 24 hr tablet Take 50 mg by mouth daily. Take with or immediately following a meal.     Multiple Vitamin (MULTIVITAMIN WITH MINERALS) TABS tablet Take 1 tablet by mouth daily.     pantoprazole (PROTONIX) 40 MG tablet Take 40 mg by mouth daily.     polyethylene glycol (MIRALAX) 17 g packet Take 17 g by mouth daily. 14 each 0   potassium chloride (KLOR-CON) 10 MEQ tablet Take 10 mEq by mouth once daily as needed with torsemide.     pramipexole (MIRAPEX) 0.125 MG tablet Take 0.125 mg by mouth 4 (four) times daily.     pravastatin (PRAVACHOL) 40 MG tablet Take 40 mg by mouth daily.     QUEtiapine (SEROQUEL) 100 MG tablet Take 100 mg by mouth at bedtime.     sertraline (ZOLOFT) 25 MG tablet Take 1 tablet (25 mg total) by mouth daily. 30 tablet 0   torsemide (DEMADEX) 20 MG tablet Take 20 mg by mouth daily as needed  (fluid).     levothyroxine (SYNTHROID) 75 MCG tablet Take 75 mcg by mouth daily.     losartan (COZAAR) 100 MG tablet Take 1 tablet (100 mg total) by mouth daily. 90 tablet 3   [DISCONTINUED] glimepiride (AMARYL) 4 MG tablet Take 4 mg by mouth 2 (two)  times daily. Pt. Takes 1 tablet in the morning and .5 tablet at night.     No current facility-administered medications on file prior to visit.    There are no Patient Instructions on file for this visit. No follow-ups on file.   Kris Hartmann, NP

## 2022-12-26 ENCOUNTER — Encounter (INDEPENDENT_AMBULATORY_CARE_PROVIDER_SITE_OTHER): Payer: Self-pay | Admitting: Nurse Practitioner

## 2022-12-26 NOTE — Progress Notes (Incomplete)
MRN : [id number]  Stephanie [name] is a 77 y.o. ([birth year]) female who presents with chief complaint of check circulation.  History of Present Illness:   Patient is seen for evaluation of leg pain and leg swelling. The patient first noticed the swelling remotely. The swelling is associated with pain and redness. The pain and swelling worsens with prolonged dependency and improves with elevation. The pain is unrelated to activity.  The patient notes that in the morning the legs are significantly improved but they steadily worsened throughout the course of the day. The patient also notes a steady worsening of the discoloration in the ankle and shin area.   The patient denies claudication symptoms.  The patient denies symptoms consistent with rest pain.  The patient denies and extensive history of DJD and LS spine disease.  The patient has no had any past angiography, interventions or vascular surgery.  Elevation makes the leg symptoms better, dependency makes them much worse. There is no history of ulcerations. The patient denies any recent changes in medications.  The patient has not been wearing graduated compression.  The patient denies a history of DVT or PE. There is no prior history of phlebitis. There is no history of primary lymphedema.  No history of malignancies. No history of trauma or groin or pelvic surgery. There is no history of radiation treatment to the groin or pelvis  The patient denies amaurosis fugax or recent TIA symptoms. There are no recent neurological changes noted. The patient denies recent episodes of angina or shortness of breath  No outpatient medications have been marked as taking for the 12/09/22 encounter (Appointment) with Delana Meyer, Dolores Lory, MD.    Past Medical History:  Diagnosis Date  . Anemia   . Arthritis   . CHF (congestive heart failure) ([place])   . Chronic back pain   . Diabetes mellitus without complication ([place])   .  Dysrhythmia   . GERD (gastroesophageal reflux disease)   . Hyperlipidemia   . Hypertension   . Hypothyroid   . Multilevel degenerative disc disease   . Parkinson's disease ([place])   . Sleep apnea   . Syncope   . Tremors of nervous system   . Wears dentures    full upper and lower    Past Surgical History:  Procedure Laterality Date  . ABDOMINAL HYSTERECTOMY    . BACK SURGERY  09/16  . CARDIAC CATHETERIZATION    . CATARACT EXTRACTION W/PHACO Right 11/24/2015   Procedure: CATARACT EXTRACTION PHACO AND INTRAOCULAR LENS PLACEMENT (I[facility]);  Surgeon: Estill Cotta, MD;  Location: [facility];  Service: Ophthalmology;  Laterality: Right;  [place] 01:14 AP% 25.5 CDE 30.69 fluid pack lot # [id number]  . CATARACT EXTRACTION W/PHACO Left 01/06/2021   Procedure: CATARACT EXTRACTION PHACO AND INTRAOCULAR LENS PLACEMENT ([facility]) LEFT DIABETIC 6.98 00:40.1;  Surgeon: Birder Robson, MD;  Location: [facility];  Service: Ophthalmology;  Laterality: Left;  Diabetic - oral meds  . COLONOSCOPY    . COLONOSCOPY WITH PROPOFOL N/A 07/24/2019   Procedure: COLONOSCOPY WITH PROPOFOL;  Surgeon: Toledo, Benay Pike, MD;  Location: [facility];  Service: Gastroenterology;  Laterality: N/A;  . ESOPHAGOGASTRODUODENOSCOPY N/A 07/24/2019   Procedure: ESOPHAGOGASTRODUODENOSCOPY (EGD);  Surgeon: Toledo, Benay Pike, MD;  Location: [facility];  Service: Gastroenterology;  Laterality: N/A;  . OOPHORECTOMY      Social History Social History   Tobacco Use  . Smoking status: Never  .  Smokeless tobacco: Never  Vaping Use  . Vaping Use: Never used  Substance Use Topics  . Alcohol use: Not Currently    Comment: rare  . Drug use: No    Family History Family History  Problem Relation Age of Onset  . Breast cancer Paternal Aunt     No Known Allergies   REVIEW OF SYSTEMS (Negative unless checked)  Constitutional: '[]'$ Weight loss  '[]'$ Fever  '[]'$ Chills Cardiac: '[]'$ Chest pain   '[]'$ Chest pressure   '[]'$ Palpitations    '[]'$ Shortness of breath when laying flat   '[]'$ Shortness of breath with exertion. Vascular:  '[x]'$ Pain in legs with walking   '[]'$ Pain in legs at rest  '[]'$ History of DVT   '[]'$ Phlebitis   '[x]'$ Swelling in legs   '[]'$ Varicose veins   '[x]'$ Non-healing ulcers Pulmonary:   '[]'$ Uses home oxygen   '[]'$ Productive cough   '[]'$ Hemoptysis   '[]'$ Wheeze  '[]'$ COPD   '[]'$ Asthma Neurologic:  '[]'$ Dizziness   '[]'$ Seizures   '[]'$ History of stroke   '[]'$ History of TIA  '[]'$ Aphasia   '[]'$ Vissual changes   '[]'$ Weakness or numbness in arm   '[]'$ Weakness or numbness in leg Musculoskeletal:   '[]'$ Joint swelling   '[]'$ Joint pain   '[]'$ Low back pain Hematologic:  '[]'$ Easy bruising  '[]'$ Easy bleeding   '[]'$ Hypercoagulable state   '[]'$ Anemic Gastrointestinal:  '[]'$ Diarrhea   '[]'$ Vomiting  '[]'$ Gastroesophageal reflux/heartburn   '[]'$ Difficulty swallowing. Genitourinary:  '[]'$ Chronic kidney disease   '[]'$ Difficult urination  '[]'$ Frequent urination   '[]'$ Blood in urine Skin:  '[]'$ Rashes   '[]'$ Ulcers  Psychological:  '[]'$ History of anxiety   '[]'$  History of major depression.  Physical Examination  There were no vitals filed for this visit. There is no height or weight on file to calculate BMI. Gen: WD/WN, NAD Head: [place]/AT, No temporalis wasting.  Ear/Nose/Throat: Hearing grossly intact, nares w/o erythema or drainage Eyes: PER, EOMI, sclera nonicteric.  Neck: Supple, no masses.  No bruit or JVD.  Pulmonary:  Good air movement, no audible wheezing, no use of accessory muscles.  Cardiac: RRR, normal S1, S2, no Murmurs. Vascular:  mild trophic changes, no open wounds Vessel Right Left  Radial Palpable Palpable  PT Not Palpable Not Palpable  DP Not Palpable Not Palpable  Gastrointestinal: soft, non-distended. No guarding/no peritoneal signs.  Musculoskeletal: M/S 5/5 throughout.  No visible deformity.  Neurologic: CN 2-12 intact. Pain and light touch intact in extremities.  Symmetrical.  Speech is fluent. Motor exam as listed above. Psychiatric: Judgment intact, Mood & affect appropriate for pt's  clinical situation. Dermatologic: No rashes or ulcers noted.  No changes consistent with cellulitis.   CBC Lab Results  Component Value Date   WBC 5.8 10/10/2022   HGB 11.7 (L) 10/10/2022   HCT 36.9 10/10/2022   MCV 94.4 10/10/2022   PLT 271 10/10/2022    BMET    Component Value Date/Time   NA 137 10/10/2022 0407   K 4.4 10/10/2022 0407   CL 104 10/10/2022 0407   CO2 28 10/10/2022 0407   GLUCOSE 97 10/10/2022 0407   BUN 20 10/10/2022 0407   CREATININE 0.61 10/10/2022 0407   CREATININE 0.96 02/27/2012 0836   CALCIUM 8.5 (L) 10/10/2022 0407   GFRNONAA >60 10/10/2022 0407   GFRNONAA >60 02/27/2012 0836   GFRAA >60 01/02/2016 0756   GFRAA >60 02/27/2012 0836   CrCl cannot be calculated (Patient's most recent lab result is older than the maximum 21 days allowed.).  COAG Lab Results  Component Value Date   INR 1.3 (H) 02/12/2022   INR 1.2 07/21/2021  INR 1.2 07/06/2021    Radiology No results found.   Assessment/Plan    Hortencia Pilar, MD  12/07/2022 8:49 PM

## 2023-01-20 ENCOUNTER — Ambulatory Visit (INDEPENDENT_AMBULATORY_CARE_PROVIDER_SITE_OTHER): Payer: Medicare Other | Admitting: Nurse Practitioner

## 2023-01-27 ENCOUNTER — Ambulatory Visit (INDEPENDENT_AMBULATORY_CARE_PROVIDER_SITE_OTHER): Payer: Medicare Other | Admitting: Nurse Practitioner

## 2023-02-16 ENCOUNTER — Inpatient Hospital Stay
Admission: EM | Admit: 2023-02-16 | Discharge: 2023-02-24 | DRG: 551 | Disposition: A | Discharge status: Skilled Nursing Facility | Payer: Medicare Other | Attending: Hospitalist | Admitting: Hospitalist

## 2023-02-16 ENCOUNTER — Emergency Department: Payer: Medicare Other

## 2023-02-16 ENCOUNTER — Other Ambulatory Visit: Payer: Self-pay

## 2023-02-16 ENCOUNTER — Encounter: Payer: Self-pay | Admitting: Emergency Medicine

## 2023-02-16 DIAGNOSIS — M4714 Other spondylosis with myelopathy, thoracic region: Secondary | ICD-10-CM

## 2023-02-16 DIAGNOSIS — Z803 Family history of malignant neoplasm of breast: Secondary | ICD-10-CM

## 2023-02-16 DIAGNOSIS — Z9981 Dependence on supplemental oxygen: Secondary | ICD-10-CM

## 2023-02-16 DIAGNOSIS — Z7984 Long term (current) use of oral hypoglycemic drugs: Secondary | ICD-10-CM

## 2023-02-16 DIAGNOSIS — Z7982 Long term (current) use of aspirin: Secondary | ICD-10-CM

## 2023-02-16 DIAGNOSIS — S22080A Wedge compression fracture of T11-T12 vertebra, initial encounter for closed fracture: Secondary | ICD-10-CM | POA: Diagnosis present

## 2023-02-16 DIAGNOSIS — K219 Gastro-esophageal reflux disease without esophagitis: Secondary | ICD-10-CM | POA: Insufficient documentation

## 2023-02-16 DIAGNOSIS — R262 Difficulty in walking, not elsewhere classified: Secondary | ICD-10-CM | POA: Diagnosis present

## 2023-02-16 DIAGNOSIS — Z79890 Hormone replacement therapy (postmenopausal): Secondary | ICD-10-CM

## 2023-02-16 DIAGNOSIS — I160 Hypertensive urgency: Secondary | ICD-10-CM

## 2023-02-16 DIAGNOSIS — I700 Atherosclerosis of aorta: Secondary | ICD-10-CM | POA: Diagnosis present

## 2023-02-16 DIAGNOSIS — M5136 Other intervertebral disc degeneration, lumbar region: Secondary | ICD-10-CM | POA: Diagnosis present

## 2023-02-16 DIAGNOSIS — R339 Retention of urine, unspecified: Secondary | ICD-10-CM | POA: Diagnosis present

## 2023-02-16 DIAGNOSIS — G8929 Other chronic pain: Secondary | ICD-10-CM | POA: Diagnosis present

## 2023-02-16 DIAGNOSIS — M1A9XX1 Chronic gout, unspecified, with tophus (tophi): Secondary | ICD-10-CM | POA: Diagnosis present

## 2023-02-16 DIAGNOSIS — M1990 Unspecified osteoarthritis, unspecified site: Secondary | ICD-10-CM | POA: Diagnosis present

## 2023-02-16 DIAGNOSIS — E1142 Type 2 diabetes mellitus with diabetic polyneuropathy: Secondary | ICD-10-CM | POA: Diagnosis present

## 2023-02-16 DIAGNOSIS — G20A1 Parkinson's disease without dyskinesia, without mention of fluctuations: Secondary | ICD-10-CM | POA: Diagnosis present

## 2023-02-16 DIAGNOSIS — E785 Hyperlipidemia, unspecified: Secondary | ICD-10-CM | POA: Diagnosis present

## 2023-02-16 DIAGNOSIS — R52 Pain, unspecified: Secondary | ICD-10-CM

## 2023-02-16 DIAGNOSIS — L03116 Cellulitis of left lower limb: Secondary | ICD-10-CM | POA: Diagnosis present

## 2023-02-16 DIAGNOSIS — I447 Left bundle-branch block, unspecified: Secondary | ICD-10-CM | POA: Diagnosis present

## 2023-02-16 DIAGNOSIS — L89150 Pressure ulcer of sacral region, unstageable: Secondary | ICD-10-CM | POA: Diagnosis present

## 2023-02-16 DIAGNOSIS — E039 Hypothyroidism, unspecified: Secondary | ICD-10-CM | POA: Diagnosis present

## 2023-02-16 DIAGNOSIS — M5137 Other intervertebral disc degeneration, lumbosacral region: Secondary | ICD-10-CM

## 2023-02-16 DIAGNOSIS — G4733 Obstructive sleep apnea (adult) (pediatric): Secondary | ICD-10-CM | POA: Diagnosis present

## 2023-02-16 DIAGNOSIS — D649 Anemia, unspecified: Secondary | ICD-10-CM | POA: Diagnosis present

## 2023-02-16 DIAGNOSIS — E876 Hypokalemia: Secondary | ICD-10-CM

## 2023-02-16 DIAGNOSIS — M25551 Pain in right hip: Principal | ICD-10-CM

## 2023-02-16 DIAGNOSIS — L03115 Cellulitis of right lower limb: Secondary | ICD-10-CM | POA: Diagnosis present

## 2023-02-16 DIAGNOSIS — R54 Age-related physical debility: Secondary | ICD-10-CM | POA: Diagnosis present

## 2023-02-16 DIAGNOSIS — K5641 Fecal impaction: Secondary | ICD-10-CM | POA: Diagnosis present

## 2023-02-16 DIAGNOSIS — Z79899 Other long term (current) drug therapy: Secondary | ICD-10-CM

## 2023-02-16 DIAGNOSIS — J9611 Chronic respiratory failure with hypoxia: Secondary | ICD-10-CM | POA: Diagnosis present

## 2023-02-16 DIAGNOSIS — I5023 Acute on chronic systolic (congestive) heart failure: Secondary | ICD-10-CM | POA: Diagnosis present

## 2023-02-16 DIAGNOSIS — J189 Pneumonia, unspecified organism: Secondary | ICD-10-CM | POA: Diagnosis not present

## 2023-02-16 DIAGNOSIS — M4804 Spinal stenosis, thoracic region: Secondary | ICD-10-CM

## 2023-02-16 DIAGNOSIS — I878 Other specified disorders of veins: Secondary | ICD-10-CM | POA: Diagnosis present

## 2023-02-16 DIAGNOSIS — I110 Hypertensive heart disease with heart failure: Secondary | ICD-10-CM | POA: Diagnosis present

## 2023-02-16 DIAGNOSIS — M545 Low back pain, unspecified: Secondary | ICD-10-CM

## 2023-02-16 DIAGNOSIS — M109 Gout, unspecified: Secondary | ICD-10-CM | POA: Diagnosis present

## 2023-02-16 DIAGNOSIS — R296 Repeated falls: Secondary | ICD-10-CM | POA: Diagnosis present

## 2023-02-16 LAB — URINALYSIS, ROUTINE W REFLEX MICROSCOPIC
Bacteria, UA: NONE SEEN
Bilirubin Urine: NEGATIVE
Glucose, UA: NEGATIVE mg/dL
Hgb urine dipstick: NEGATIVE
Ketones, ur: 5 mg/dL — AB
Leukocytes,Ua: NEGATIVE
Nitrite: NEGATIVE
Protein, ur: 30 mg/dL — AB
Specific Gravity, Urine: 1.02 (ref 1.005–1.030)
Squamous Epithelial / HPF: NONE SEEN /HPF (ref 0–5)
pH: 5 (ref 5.0–8.0)

## 2023-02-16 LAB — COMPREHENSIVE METABOLIC PANEL
ALT: <5 U/L (ref 0–44)
AST: 23 U/L (ref 15–41)
Albumin: 3.3 g/dL — ABNORMAL LOW (ref 3.5–5.0)
Alkaline Phosphatase: 91 U/L (ref 38–126)
Anion gap: 11 (ref 5–15)
BUN: 45 mg/dL — ABNORMAL HIGH (ref 8–23)
CO2: 27 mmol/L (ref 22–32)
Calcium: 8.4 mg/dL — ABNORMAL LOW (ref 8.9–10.3)
Chloride: 101 mmol/L (ref 98–111)
Creatinine, Ser: 1.03 mg/dL — ABNORMAL HIGH (ref 0.44–1.00)
GFR, Estimated: 56 mL/min — ABNORMAL LOW (ref >60)
Glucose, Bld: 104 mg/dL — ABNORMAL HIGH (ref 70–99)
Potassium: 2.8 mmol/L — ABNORMAL LOW (ref 3.5–5.1)
Sodium: 139 mmol/L (ref 135–145)
Total Bilirubin: 1.1 mg/dL (ref 0.3–1.2)
Total Protein: 6.4 g/dL — ABNORMAL LOW (ref 6.5–8.1)

## 2023-02-16 LAB — TROPONIN I (HIGH SENSITIVITY)
Troponin I (High Sensitivity): 19 ng/L — ABNORMAL HIGH (ref <18)
Troponin I (High Sensitivity): 21 ng/L — ABNORMAL HIGH (ref <18)

## 2023-02-16 LAB — CBC WITH DIFFERENTIAL/PLATELET
Abs Immature Granulocytes: 0.02 10*3/uL (ref 0.00–0.07)
Basophils Absolute: 0.1 10*3/uL (ref 0.0–0.1)
Basophils Relative: 1 %
Eosinophils Absolute: 0 10*3/uL (ref 0.0–0.5)
Eosinophils Relative: 0 %
HCT: 37.1 % (ref 36.0–46.0)
Hemoglobin: 12.2 g/dL (ref 12.0–15.0)
Immature Granulocytes: 0 %
Lymphocytes Relative: 6 %
Lymphs Abs: 0.4 10*3/uL — ABNORMAL LOW (ref 0.7–4.0)
MCH: 31.3 pg (ref 26.0–34.0)
MCHC: 32.9 g/dL (ref 30.0–36.0)
MCV: 95.1 fL (ref 80.0–100.0)
Monocytes Absolute: 0.3 10*3/uL (ref 0.1–1.0)
Monocytes Relative: 4 %
Neutro Abs: 6.7 10*3/uL (ref 1.7–7.7)
Neutrophils Relative %: 89 %
Platelets: 294 10*3/uL (ref 150–400)
RBC: 3.9 MIL/uL (ref 3.87–5.11)
RDW: 15.5 % (ref 11.5–15.5)
WBC: 7.5 10*3/uL (ref 4.0–10.5)
nRBC: 0 % (ref 0.0–0.2)

## 2023-02-16 MED ORDER — ACETAMINOPHEN 500 MG PO TABS
1000.0000 mg | ORAL_TABLET | Freq: Once | ORAL | Status: DC
  Filled 2023-02-16: qty 2

## 2023-02-16 MED ORDER — POTASSIUM CHLORIDE 10 MEQ/100ML IV SOLN
10.0000 meq | Freq: Once | INTRAVENOUS | Status: AC
  Administered 2023-02-16: 10 meq via INTRAVENOUS
  Filled 2023-02-16: qty 100

## 2023-02-16 MED ORDER — SODIUM CHLORIDE 0.9 % IV BOLUS
500.0000 mL | Freq: Once | INTRAVENOUS | Status: AC
  Administered 2023-02-16: 500 mL via INTRAVENOUS

## 2023-02-16 MED ORDER — FENTANYL CITRATE PF 50 MCG/ML IJ SOSY
50.0000 ug | PREFILLED_SYRINGE | Freq: Once | INTRAMUSCULAR | Status: AC
  Administered 2023-02-16: 50 ug via INTRAVENOUS
  Filled 2023-02-16: qty 1

## 2023-02-16 MED ORDER — LORAZEPAM 2 MG/ML IJ SOLN: 0.5000 mg | Freq: Once | INTRAMUSCULAR | Status: DC

## 2023-02-16 NOTE — ED Provider Notes (Signed)
[facility] Provider Note    Event Date/Time   First MD Initiated Contact with Patient 02/16/23 1748     (approximate)   History   Hip Pain   HPI  SHAWNTE WINTON is a 77 y.o. female  with history of CHF, diabetes, HTN, and as listed in EMR presents to the emergency department for treatment and evaluation of right hip pain x 2 months per EMS report.  Patient unable to provide history. Information obtained from daughter, [name]. Until about 3 days ago, she was able to use her walker to get around in the house and go to the bathroom by herself. No known injury, but something changed causing and she now does not want to move her legs. She had been having back/hip pain and saw PCP who ordered imaging, ibuprofen, and percocet. At first it seemed to help, but isn't now.       Physical Exam   Triage Vital Signs: ED Triage Vitals  Enc Vitals Group     BP 02/16/23 1648 (!) 162/78     Pulse Rate 02/16/23 1648 64     Resp 02/16/23 1648 16     Temp 02/16/23 1648 97.8 F (36.6 C)     Temp Source 02/16/23 1648 Oral     SpO2 02/16/23 1648 95 %     Weight --      Height 02/16/23 1646 5' (1.524 m)     Head Circumference --      Peak Flow --      Pain Score 02/16/23 1646 Asleep     Pain Loc --      Pain Edu? --      Excl. in GC? --     Most recent vital signs: Vitals:   02/16/23 1648 02/16/23 2238  BP: (!) 162/78 (!) 175/78  Pulse: 64 78  Resp: 16 16  Temp: 97.8 F (36.6 C)   SpO2: 95% 100%    General: Awake, no distress. Lips and tongue are dry. CV:  Good peripheral perfusion.  Resp:  Normal effort.  Abd:  Appears distended. No tenderness on exam. Other:  Cries out with attempt to bend at either knee.   Una boots very tight on legs bilaterally--removed--legs erythematous bilaterally without pitting edema. Scattered superficial sores noted.    ED Results / Procedures / Treatments   Labs (all labs ordered are listed, but only abnormal results  are displayed) Labs Reviewed  COMPREHENSIVE METABOLIC PANEL - Abnormal; Notable for the following components:      Result Value   Potassium 2.8 (*)    Glucose, Bld 104 (*)    BUN 45 (*)    Creatinine, Ser 1.03 (*)    Calcium 8.4 (*)    Total Protein 6.4 (*)    Albumin 3.3 (*)    GFR, Estimated 56 (*)    All other components within normal limits  CBC WITH DIFFERENTIAL/PLATELET - Abnormal; Notable for the following components:   Lymphs Abs 0.4 (*)    All other components within normal limits  URINALYSIS, ROUTINE W REFLEX MICROSCOPIC - Abnormal; Notable for the following components:   Color, Urine AMBER (*)    APPearance CLEAR (*)    Ketones, ur 5 (*)    Protein, ur 30 (*)    All other components within normal limits  TROPONIN I (HIGH SENSITIVITY) - Abnormal; Notable for the following components:   Troponin I (High Sensitivity) 19 (*)    All other components within  normal limits  TROPONIN I (HIGH SENSITIVITY) - Abnormal; Notable for the following components:   Troponin I (High Sensitivity) 21 (*)    All other components within normal limits     EKG   Sinus rhythm with PAC. LBB.  RADIOLOGY  Image and radiology report reviewed and interpreted by me. Radiology report consistent with the same.  Chest x-ray without acute concerns.  CT of the right hip shows no acute bony abnormality however bladder distention is noted.  PROCEDURES:  Critical Care performed: No  Procedures   MEDICATIONS ORDERED IN ED:  Medications  acetaminophen (TYLENOL) tablet 1,000 mg (1,000 mg Oral Not Given 02/16/23 1944)  LORazepam (ATIVAN) injection 0.5 mg (has no administration in time range)  sodium chloride 0.9 % bolus 500 mL (0 mLs Intravenous Stopped 02/16/23 2233)  fentaNYL (SUBLIMAZE) injection 50 mcg (50 mcg Intravenous Given 02/16/23 2056)  potassium chloride 10 mEq in 100 mL IVPB (0 mEq Intravenous Stopped 02/16/23 2233)     IMPRESSION / MDM / ASSESSMENT AND PLAN / ED COURSE   I  have reviewed the triage note.  Differential diagnosis includes, but is not limited to, occult hip fracture, osteoarthritis, lumbar vertebral compression fracture, acute on chronic pain, cauda equina, progressive each change  Patient's presentation is most consistent with acute presentation with potential threat to life or bodily function.  77 year old female presents to the emergency department for evaluation of right hip pain x 2 months.  See HPI for further details.  Plan will be to get imaging and labs.  Image of the right hip without acute concerns.  Labs noted potassium of 2.8.  IV potassium ordered as patient is n.p.o. at this time.  GFR is at her baseline.  CBC is unremarkable.  Urinalysis is pending.  CT of the right hip is without acute bony abnormality however bladder is distended enough to be partially visualized.  Bladder scanned and with greater than mL.  #16 French Foley catheter inserted with immediate return of straw-colored urine.  Rectal tone is present.  She does have soft stool in the rectum.  Plan will be to get MRI of the lumbar spine as cauda equina cannot be completely ruled out with retention of both urine and stool however rectal tone is present.  ED attending, Dr. Sidney Ace, has also examined the patient and is agreeable with the above plan.  Care relinquished to oncoming shift.  Dr. Dolores Frame will follow to disposition.      FINAL CLINICAL IMPRESSION(S) / ED DIAGNOSES   Final diagnoses:  None  Inability to walk. Right hip pain. Urinary retention. Constipation.   Rx / DC Orders   ED Discharge Orders     None        Note:  This document was prepared using Dragon voice recognition software and may include unintentional dictation errors.   Chinita Pester, FNP 02/18/23 1610    Georga Hacking, MD 02/23/23 [phone]

## 2023-02-16 NOTE — ED Triage Notes (Addendum)
First Nurse Note;  Pt via EMS from home. Pt has a hx of parkinson's disease, pt c/o R hip pain for the past 2 months. Pt has had it XR and has a hx of osteoarthritis.  96% on 2L chronic 150 CBG  158/68 Pt is A&Ox4 and NAD

## 2023-02-16 NOTE — ED Notes (Signed)
Pts bladder scan showed >999

## 2023-02-16 NOTE — ED Notes (Signed)
Pt had bilateral lower legs wrapped by family member at home, dressings in place were extremely tight and caused swelling both above, at knee and below at foot. Dressings removed, Cari beth NP at bedside.

## 2023-02-16 NOTE — ED Notes (Signed)
Pt to MRI now. EDT confirmed with MRI Tech that pt will be brought back to ED room 04 after pts MRI

## 2023-02-16 NOTE — ED Provider Triage Note (Signed)
Emergency Medicine Provider Triage Evaluation Note  Stephanie [name] , a 77 y.o. female  was evaluated in triage.  Pt complains of left hand pain after car hood fell on it while at work. Pain in pinky and ring fingers..  Physical Exam  There were no vitals taken for this visit. Gen:   Awake, no distress   Resp:  Normal effort  MSK:   Moves extremities without difficulty  Other:  Wounds noted to left hand 4th and 5th fingers  Medical Decision Making  Medically screening exam initiated at 4:41 PM.  Appropriate orders placed.  Stephanie [name] was informed that the remainder of the evaluation will be completed by another provider, this initial triage assessment does not replace that evaluation, and the importance of remaining in the ED until their evaluation is complete.     Chinita Pester, FNP 02/16/23 1642

## 2023-02-17 ENCOUNTER — Observation Stay: Payer: Medicare Other

## 2023-02-17 ENCOUNTER — Encounter: Payer: Self-pay | Admitting: Family Medicine

## 2023-02-17 ENCOUNTER — Emergency Department: Payer: Medicare Other

## 2023-02-17 DIAGNOSIS — Z7984 Long term (current) use of oral hypoglycemic drugs: Secondary | ICD-10-CM | POA: Diagnosis not present

## 2023-02-17 DIAGNOSIS — I700 Atherosclerosis of aorta: Secondary | ICD-10-CM | POA: Diagnosis present

## 2023-02-17 DIAGNOSIS — K219 Gastro-esophageal reflux disease without esophagitis: Secondary | ICD-10-CM | POA: Insufficient documentation

## 2023-02-17 DIAGNOSIS — J9611 Chronic respiratory failure with hypoxia: Secondary | ICD-10-CM | POA: Diagnosis present

## 2023-02-17 DIAGNOSIS — L89150 Pressure ulcer of sacral region, unstageable: Secondary | ICD-10-CM | POA: Diagnosis present

## 2023-02-17 DIAGNOSIS — M4804 Spinal stenosis, thoracic region: Secondary | ICD-10-CM

## 2023-02-17 DIAGNOSIS — M4714 Other spondylosis with myelopathy, thoracic region: Secondary | ICD-10-CM | POA: Diagnosis present

## 2023-02-17 DIAGNOSIS — K5641 Fecal impaction: Secondary | ICD-10-CM | POA: Diagnosis present

## 2023-02-17 DIAGNOSIS — L03115 Cellulitis of right lower limb: Secondary | ICD-10-CM | POA: Diagnosis present

## 2023-02-17 DIAGNOSIS — L03119 Cellulitis of unspecified part of limb: Secondary | ICD-10-CM | POA: Diagnosis not present

## 2023-02-17 DIAGNOSIS — Z79890 Hormone replacement therapy (postmenopausal): Secondary | ICD-10-CM | POA: Diagnosis not present

## 2023-02-17 DIAGNOSIS — I5023 Acute on chronic systolic (congestive) heart failure: Secondary | ICD-10-CM

## 2023-02-17 DIAGNOSIS — M5137 Other intervertebral disc degeneration, lumbosacral region: Secondary | ICD-10-CM | POA: Diagnosis not present

## 2023-02-17 DIAGNOSIS — E785 Hyperlipidemia, unspecified: Secondary | ICD-10-CM | POA: Diagnosis present

## 2023-02-17 DIAGNOSIS — Z79899 Other long term (current) drug therapy: Secondary | ICD-10-CM | POA: Diagnosis not present

## 2023-02-17 DIAGNOSIS — R262 Difficulty in walking, not elsewhere classified: Secondary | ICD-10-CM

## 2023-02-17 DIAGNOSIS — M25551 Pain in right hip: Secondary | ICD-10-CM

## 2023-02-17 DIAGNOSIS — G20A1 Parkinson's disease without dyskinesia, without mention of fluctuations: Secondary | ICD-10-CM | POA: Diagnosis present

## 2023-02-17 DIAGNOSIS — I160 Hypertensive urgency: Secondary | ICD-10-CM

## 2023-02-17 DIAGNOSIS — D649 Anemia, unspecified: Secondary | ICD-10-CM | POA: Diagnosis present

## 2023-02-17 DIAGNOSIS — I110 Hypertensive heart disease with heart failure: Secondary | ICD-10-CM | POA: Diagnosis present

## 2023-02-17 DIAGNOSIS — M545 Low back pain, unspecified: Secondary | ICD-10-CM | POA: Diagnosis not present

## 2023-02-17 DIAGNOSIS — L03116 Cellulitis of left lower limb: Secondary | ICD-10-CM | POA: Diagnosis present

## 2023-02-17 DIAGNOSIS — G20B2 Parkinson's disease with dyskinesia, with fluctuations: Secondary | ICD-10-CM | POA: Diagnosis not present

## 2023-02-17 DIAGNOSIS — E876 Hypokalemia: Secondary | ICD-10-CM | POA: Diagnosis present

## 2023-02-17 DIAGNOSIS — E1142 Type 2 diabetes mellitus with diabetic polyneuropathy: Secondary | ICD-10-CM | POA: Diagnosis present

## 2023-02-17 DIAGNOSIS — E039 Hypothyroidism, unspecified: Secondary | ICD-10-CM | POA: Diagnosis present

## 2023-02-17 DIAGNOSIS — S22080A Wedge compression fracture of T11-T12 vertebra, initial encounter for closed fracture: Secondary | ICD-10-CM | POA: Diagnosis present

## 2023-02-17 DIAGNOSIS — J189 Pneumonia, unspecified organism: Secondary | ICD-10-CM | POA: Diagnosis not present

## 2023-02-17 DIAGNOSIS — R339 Retention of urine, unspecified: Secondary | ICD-10-CM | POA: Diagnosis not present

## 2023-02-17 LAB — BASIC METABOLIC PANEL
Anion gap: 9 (ref 5–15)
Anion gap: 9 (ref 5–15)
BUN: 34 mg/dL — ABNORMAL HIGH (ref 8–23)
BUN: 28 mg/dL — ABNORMAL HIGH (ref 8–23)
CO2: 28 mmol/L (ref 22–32)
CO2: 31 mmol/L (ref 22–32)
Calcium: 8.5 mg/dL — ABNORMAL LOW (ref 8.9–10.3)
Calcium: 8.4 mg/dL — ABNORMAL LOW (ref 8.9–10.3)
Chloride: 103 mmol/L (ref 98–111)
Chloride: 101 mmol/L (ref 98–111)
Creatinine, Ser: 0.68 mg/dL (ref 0.44–1.00)
Creatinine, Ser: 0.73 mg/dL (ref 0.44–1.00)
GFR, Estimated: >60 mL/min (ref >60)
GFR, Estimated: >60 mL/min (ref >60)
Glucose, Bld: 92 mg/dL (ref 70–99)
Glucose, Bld: 194 mg/dL — ABNORMAL HIGH (ref 70–99)
Potassium: 2.8 mmol/L — ABNORMAL LOW (ref 3.5–5.1)
Potassium: 3.1 mmol/L — ABNORMAL LOW (ref 3.5–5.1)
Sodium: 140 mmol/L (ref 135–145)
Sodium: 141 mmol/L (ref 135–145)

## 2023-02-17 LAB — CBC
HCT: 38.6 % (ref 36.0–46.0)
Hemoglobin: 12.5 g/dL (ref 12.0–15.0)
MCH: 30.4 pg (ref 26.0–34.0)
MCHC: 32.4 g/dL (ref 30.0–36.0)
MCV: 93.9 fL (ref 80.0–100.0)
Platelets: 302 10*3/uL (ref 150–400)
RBC: 4.11 MIL/uL (ref 3.87–5.11)
RDW: 15.3 % (ref 11.5–15.5)
WBC: 8.3 10*3/uL (ref 4.0–10.5)
nRBC: 0 % (ref 0.0–0.2)

## 2023-02-17 LAB — HEMOGLOBIN A1C
Hgb A1c MFr Bld: 6.1 % — ABNORMAL HIGH (ref 4.8–5.6)
Mean Plasma Glucose: 128.37 mg/dL

## 2023-02-17 LAB — GLUCOSE, CAPILLARY
Glucose-Capillary: 183 mg/dL — ABNORMAL HIGH (ref 70–99)
Glucose-Capillary: 150 mg/dL — ABNORMAL HIGH (ref 70–99)

## 2023-02-17 LAB — CBG MONITORING, ED
Glucose-Capillary: 74 mg/dL (ref 70–99)
Glucose-Capillary: 92 mg/dL (ref 70–99)

## 2023-02-17 LAB — MAGNESIUM: Magnesium: 2.1 mg/dL (ref 1.7–2.4)

## 2023-02-17 MED ORDER — PANTOPRAZOLE SODIUM 40 MG PO TBEC
40.0000 mg | DELAYED_RELEASE_TABLET | Freq: Every day | ORAL | Status: DC
  Administered 2023-02-18 – 2023-02-24 (×7): 40 mg via ORAL
  Filled 2023-02-17 (×7): qty 1

## 2023-02-17 MED ORDER — MORPHINE SULFATE (PF) 2 MG/ML IV SOLN
2.0000 mg | INTRAVENOUS | Status: DC | PRN
  Administered 2023-02-17 – 2023-02-18 (×4): 2 mg via INTRAVENOUS
  Filled 2023-02-17 (×4): qty 1

## 2023-02-17 MED ORDER — TORSEMIDE 20 MG PO TABS: 20.0000 mg | ORAL_TABLET | Freq: Every day | ORAL | Status: DC | PRN

## 2023-02-17 MED ORDER — INSULIN ASPART 100 UNIT/ML IJ SOLN: 0.0000 [IU] | Freq: Every day | INTRAMUSCULAR | Status: DC

## 2023-02-17 MED ORDER — POTASSIUM CHLORIDE CRYS ER 10 MEQ PO TBCR: 10.0000 meq | EXTENDED_RELEASE_TABLET | Freq: Every day | ORAL | Status: DC | PRN

## 2023-02-17 MED ORDER — SERTRALINE HCL 50 MG PO TABS
25.0000 mg | ORAL_TABLET | Freq: Every day | ORAL | Status: DC
  Administered 2023-02-17 – 2023-02-24 (×8): 25 mg via ORAL
  Filled 2023-02-17 (×8): qty 1

## 2023-02-17 MED ORDER — POTASSIUM CHLORIDE IN NACL 40-0.9 MEQ/L-% IV SOLN
INTRAVENOUS | Status: AC
  Filled 2023-02-17: qty 1000

## 2023-02-17 MED ORDER — POLYETHYLENE GLYCOL 3350 17 G PO PACK
17.0000 g | PACK | Freq: Every day | ORAL | Status: DC
  Administered 2023-02-18 – 2023-02-23 (×5): 17 g via ORAL
  Filled 2023-02-17 (×7): qty 1

## 2023-02-17 MED ORDER — ONDANSETRON HCL 4 MG PO TABS: 4.0000 mg | ORAL_TABLET | Freq: Four times a day (QID) | ORAL | Status: DC | PRN

## 2023-02-17 MED ORDER — ASPIRIN 81 MG PO TBEC
81.0000 mg | DELAYED_RELEASE_TABLET | Freq: Every day | ORAL | Status: DC
  Administered 2023-02-17 – 2023-02-24 (×8): 81 mg via ORAL
  Filled 2023-02-17 (×8): qty 1

## 2023-02-17 MED ORDER — ACETAMINOPHEN 325 MG PO TABS
650.0000 mg | ORAL_TABLET | Freq: Four times a day (QID) | ORAL | Status: DC | PRN
  Administered 2023-02-18 – 2023-02-23 (×2): 650 mg via ORAL
  Filled 2023-02-17 (×2): qty 2

## 2023-02-17 MED ORDER — CHLORHEXIDINE GLUCONATE CLOTH 2 % EX PADS
6.0000 | MEDICATED_PAD | Freq: Every day | CUTANEOUS | Status: DC
  Administered 2023-02-17 – 2023-02-24 (×8): 6 via TOPICAL

## 2023-02-17 MED ORDER — ALLOPURINOL 100 MG PO TABS
100.0000 mg | ORAL_TABLET | Freq: Every day | ORAL | Status: DC
  Administered 2023-02-17 – 2023-02-24 (×8): 100 mg via ORAL
  Filled 2023-02-17 (×8): qty 1

## 2023-02-17 MED ORDER — QUETIAPINE FUMARATE 25 MG PO TABS
100.0000 mg | ORAL_TABLET | Freq: Every day | ORAL | Status: DC
  Administered 2023-02-17 – 2023-02-23 (×7): 100 mg via ORAL
  Filled 2023-02-17 (×7): qty 4

## 2023-02-17 MED ORDER — TRAZODONE HCL 50 MG PO TABS: 25.0000 mg | ORAL_TABLET | Freq: Every evening | ORAL | Status: DC | PRN

## 2023-02-17 MED ORDER — CARBIDOPA-LEVODOPA ER 50-200 MG PO TBCR
1.0000 | EXTENDED_RELEASE_TABLET | Freq: Every day | ORAL | Status: DC
  Administered 2023-02-17 – 2023-02-23 (×7): 1 via ORAL
  Filled 2023-02-17 (×7): qty 1

## 2023-02-17 MED ORDER — GLUCOSE-VITAMIN C 4-6 GM-MG PO CHEW: 1.0000 | CHEWABLE_TABLET | ORAL | Status: DC | PRN

## 2023-02-17 MED ORDER — BUSPIRONE HCL 10 MG PO TABS
10.0000 mg | ORAL_TABLET | Freq: Two times a day (BID) | ORAL | Status: DC
  Administered 2023-02-17 – 2023-02-24 (×15): 10 mg via ORAL
  Filled 2023-02-17 (×9): qty 1
  Filled 2023-02-17: qty 2
  Filled 2023-02-17 (×5): qty 1

## 2023-02-17 MED ORDER — SODIUM CHLORIDE 0.9 % IV SOLN: INTRAVENOUS | Status: DC

## 2023-02-17 MED ORDER — ACETAMINOPHEN 650 MG RE SUPP: 650.0000 mg | Freq: Four times a day (QID) | RECTAL | Status: DC | PRN

## 2023-02-17 MED ORDER — METOPROLOL SUCCINATE ER 50 MG PO TB24
50.0000 mg | ORAL_TABLET | Freq: Every day | ORAL | Status: DC
  Filled 2023-02-17: qty 1

## 2023-02-17 MED ORDER — LEVOTHYROXINE SODIUM 50 MCG PO TABS
75.0000 ug | ORAL_TABLET | Freq: Every day | ORAL | Status: DC
  Administered 2023-02-17 – 2023-02-24 (×8): 75 ug via ORAL
  Filled 2023-02-17 (×4): qty 1
  Filled 2023-02-17: qty 2
  Filled 2023-02-17 (×3): qty 1

## 2023-02-17 MED ORDER — DICLOFENAC SODIUM 1 % EX GEL
4.0000 g | Freq: Four times a day (QID) | CUTANEOUS | Status: DC
  Administered 2023-02-18 – 2023-02-24 (×25): 4 g via TOPICAL
  Filled 2023-02-17: qty 100

## 2023-02-17 MED ORDER — LOSARTAN POTASSIUM 50 MG PO TABS
100.0000 mg | ORAL_TABLET | Freq: Every day | ORAL | Status: DC
  Administered 2023-02-17 – 2023-02-24 (×6): 100 mg via ORAL
  Filled 2023-02-17 (×8): qty 2

## 2023-02-17 MED ORDER — ONDANSETRON HCL 4 MG/2ML IJ SOLN: 4.0000 mg | Freq: Four times a day (QID) | INTRAMUSCULAR | Status: DC | PRN

## 2023-02-17 MED ORDER — CEFAZOLIN SODIUM-DEXTROSE 1-4 GM/50ML-% IV SOLN
1.0000 g | Freq: Three times a day (TID) | INTRAVENOUS | Status: DC
  Administered 2023-02-17 – 2023-02-19 (×8): 1 g via INTRAVENOUS
  Filled 2023-02-17 (×9): qty 50

## 2023-02-17 MED ORDER — ENOXAPARIN SODIUM 40 MG/0.4ML IJ SOSY
40.0000 mg | PREFILLED_SYRINGE | INTRAMUSCULAR | Status: DC
  Administered 2023-02-17 – 2023-02-24 (×8): 40 mg via SUBCUTANEOUS
  Filled 2023-02-17 (×8): qty 0.4

## 2023-02-17 MED ORDER — CARBIDOPA-LEVODOPA 25-100 MG PO TABS
2.0000 | ORAL_TABLET | Freq: Three times a day (TID) | ORAL | Status: DC
  Administered 2023-02-17 – 2023-02-24 (×28): 2 via ORAL
  Filled 2023-02-17 (×29): qty 2

## 2023-02-17 MED ORDER — FUROSEMIDE 10 MG/ML IJ SOLN
40.0000 mg | Freq: Every day | INTRAMUSCULAR | Status: DC
  Administered 2023-02-17 – 2023-02-20 (×4): 40 mg via INTRAVENOUS
  Filled 2023-02-17 (×5): qty 4

## 2023-02-17 MED ORDER — FLUTICASONE PROPIONATE 50 MCG/ACT NA SUSP: 1.0000 | Freq: Every day | NASAL | Status: DC | PRN

## 2023-02-17 MED ORDER — LABETALOL HCL 5 MG/ML IV SOLN
20.0000 mg | INTRAVENOUS | Status: DC | PRN
  Administered 2023-02-17 – 2023-02-18 (×2): 20 mg via INTRAVENOUS
  Filled 2023-02-17 (×2): qty 4

## 2023-02-17 MED ORDER — POTASSIUM CHLORIDE CRYS ER 20 MEQ PO TBCR: 40.0000 meq | EXTENDED_RELEASE_TABLET | Freq: Once | ORAL | Status: DC

## 2023-02-17 MED ORDER — MAGNESIUM HYDROXIDE 400 MG/5ML PO SUSP
30.0000 mL | Freq: Every day | ORAL | Status: DC | PRN
  Administered 2023-02-21 – 2023-02-23 (×3): 30 mL via ORAL
  Filled 2023-02-17 (×3): qty 30

## 2023-02-17 MED ORDER — GABAPENTIN 300 MG PO CAPS
300.0000 mg | ORAL_CAPSULE | Freq: Every day | ORAL | Status: DC
  Administered 2023-02-17 – 2023-02-23 (×7): 300 mg via ORAL
  Filled 2023-02-17 (×7): qty 1

## 2023-02-17 MED ORDER — PRAVASTATIN SODIUM 20 MG PO TABS
40.0000 mg | ORAL_TABLET | Freq: Every day | ORAL | Status: DC
  Administered 2023-02-17 – 2023-02-23 (×7): 40 mg via ORAL
  Filled 2023-02-17 (×7): qty 2

## 2023-02-17 MED ORDER — ENTACAPONE 200 MG PO TABS
100.0000 mg | ORAL_TABLET | Freq: Three times a day (TID) | ORAL | Status: DC
  Administered 2023-02-17 – 2023-02-24 (×23): 100 mg via ORAL
  Filled 2023-02-17 (×23): qty 0.5

## 2023-02-17 MED ORDER — INSULIN ASPART 100 UNIT/ML IJ SOLN
0.0000 [IU] | Freq: Three times a day (TID) | INTRAMUSCULAR | Status: DC
  Administered 2023-02-17: 2 [IU] via SUBCUTANEOUS
  Administered 2023-02-18: 1 [IU] via SUBCUTANEOUS
  Administered 2023-02-18 (×2): 2 [IU] via SUBCUTANEOUS
  Administered 2023-02-19: 3 [IU] via SUBCUTANEOUS
  Administered 2023-02-19 – 2023-02-20 (×2): 2 [IU] via SUBCUTANEOUS
  Administered 2023-02-21: 1 [IU] via SUBCUTANEOUS
  Administered 2023-02-21: 5 [IU] via SUBCUTANEOUS
  Administered 2023-02-22: 3 [IU] via SUBCUTANEOUS
  Administered 2023-02-22 – 2023-02-23 (×2): 2 [IU] via SUBCUTANEOUS
  Administered 2023-02-23 – 2023-02-24 (×2): 3 [IU] via SUBCUTANEOUS
  Filled 2023-02-17 (×15): qty 1

## 2023-02-17 MED ORDER — PRAMIPEXOLE DIHYDROCHLORIDE 0.25 MG PO TABS
0.1250 mg | ORAL_TABLET | Freq: Three times a day (TID) | ORAL | Status: DC
  Administered 2023-02-17 – 2023-02-24 (×28): 0.125 mg via ORAL
  Filled 2023-02-17 (×31): qty 0.5

## 2023-02-17 MED ORDER — POTASSIUM CHLORIDE CRYS ER 20 MEQ PO TBCR
40.0000 meq | EXTENDED_RELEASE_TABLET | Freq: Once | ORAL | Status: AC
  Administered 2023-02-17: 40 meq via ORAL
  Filled 2023-02-17: qty 2

## 2023-02-17 MED ORDER — ADULT MULTIVITAMIN W/MINERALS CH
1.0000 | ORAL_TABLET | Freq: Every day | ORAL | Status: DC
  Administered 2023-02-18 – 2023-02-24 (×7): 1 via ORAL
  Filled 2023-02-17 (×7): qty 1

## 2023-02-17 NOTE — ED Notes (Signed)
Pt woke for this RN to check CBG. Pt shook her head when asked if she wanted to eat breakfast or drink something then went back to sleep. RN will continue to monitor.

## 2023-02-17 NOTE — ED Notes (Addendum)
This RN assisted pt to eat. Pt ate all of her mashed potatoes, peaches, and drank apple juice and tea. Tolerated well. No coughing/choking noted. Pt did not pocket food. Meds given crushed in applesauce

## 2023-02-17 NOTE — ED Notes (Signed)
Patient took pills with slight difficulty, having to be coached to swallow.  MD Mansy made aware.

## 2023-02-17 NOTE — Assessment & Plan Note (Addendum)
-   The patient will be admitted to an observation medical bed. - This like secondary to right hip and back pain. - Pain management will be provided. - PT evaluation will be obtained to assist with ambulation. - Hypokalemia will be replaced.

## 2023-02-17 NOTE — Assessment & Plan Note (Signed)
-   The patient will be placed on supplement coverage with NovoLog. - We will hold off metformin. - We will continue Neurontin. 

## 2023-02-17 NOTE — ED Provider Notes (Signed)
-----------------------------------------   12:53 AM on 02/17/2023 -----------------------------------------   Care assumed of patient who is back from MRI and awaiting results.  She is sleeping in no acute distress.   ----------------------------------------- 1:07 AM on 02/17/2023 -----------------------------------------   MRI:  1. Motion degraded examination. Progression of multilevel  degenerative disc disease since 01/27/16.  2. Subacute wedge compression fracture of T11.  3. Moderate T10-11 and mild T11-12 spinal canal stenosis with severe  bilateral neural foraminal stenosis at both levels.  4. Mild L4-5 spinal canal stenosis and severe bilateral neural  foraminal stenosis.  5. Moderate bilateral L3-4 and L5-S1 neural foraminal stenosis.   MRI demonstrates no cauda equina.  At this time will consult hospitalist services for evaluation and admission.   Irean Hong, MD 02/17/23 [phone]

## 2023-02-17 NOTE — ED Notes (Signed)
Patient transported to MRI 

## 2023-02-17 NOTE — ED Notes (Signed)
Pt refusing to allow her head of the bed up due to increased hip pain with the elevation. Pt refused to sit in a recliner and says it does not help. MD notified. This RN will hold medicine until pt can safely take.

## 2023-02-17 NOTE — Assessment & Plan Note (Addendum)
-   We will continue antihypertensives and place the patient on as needed IV labetalol. - This could be contributing to #2.

## 2023-02-17 NOTE — Progress Notes (Signed)
SLP Cancellation Note  Patient Details Name: Stephanie [name] MRN: [id number] DOB: 1946-03-04   Cancelled treatment:       Reason Eval/Treat Not Completed:  (chart reviewed; consulted NSG re: pt's status.)  NSG reported she was able to position pt adequately for oral intake at Lunch meal today. Pt consumed mashed potatoes, peaches then drank tea and juice w/ no overt s/s of aspiration noted by NSG. Oral phase was also adequate w/ no pocketing of foods. NSG has been giving Pills CRUSHED in puree for safer swallowing.  ST services will continue to monitor pt's status while admitted for any swallowing needs/assessment. NSG agreed.      Jerilynn Som, MS, CCC-SLP Speech Language Pathologist Rehab Services; [facility] [phone] (ascom) Estelle Skibicki 02/17/2023, 2:29 PM

## 2023-02-17 NOTE — Progress Notes (Signed)
PT Cancellation Note  Patient Details Name: Stephanie [name] MRN: [id number] DOB: 06-19-1946   Cancelled Treatment:    Reason Eval/Treat Not Completed: Patient not medically ready. Patient has neurosurgery consult pending. Will hold PT evaluation until that is completed. Continue to follow.    [name] 02/17/2023, 11:54 AM

## 2023-02-17 NOTE — Consult Note (Addendum)
Consult requested by:  Dr. Joylene Igo  Consult requested for:  Thoracic spine pathology  Primary Physician:  Marguarite Arbour, MD  History of Present Illness: 02/17/2023 Stephanie [name] is here today with a chief complaint of difficulty walking right hip pain.  She has been having worsening pain over the past couple of months.  Over the last 3 to 4 days, she has been having more more difficulty with walking due to right hip and back pain.  She was given medication by her primary doctor but that did not prevent loss of function.  She presented due to poor functional status and difficulty walking.  I have utilized the care everywhere function in epic to review the outside records available from external health systems.  Review of Systems:  A 10 point review of systems is negative, except for the pertinent positives and negatives detailed in the HPI.  Past Medical History: Past Medical History:  Diagnosis Date   Anemia    Arthritis    CHF (congestive heart failure)    Chronic back pain    Diabetes mellitus without complication    Dysrhythmia    GERD (gastroesophageal reflux disease)    Hyperlipidemia    Hypertension    Hypothyroid    Multilevel degenerative disc disease    Parkinson's disease    Sleep apnea    Syncope    Tremors of nervous system    Wears dentures    full upper and lower    Past Surgical History: Past Surgical History:  Procedure Laterality Date   ABDOMINAL HYSTERECTOMY     BACK SURGERY  09/16   CARDIAC CATHETERIZATION     CATARACT EXTRACTION W/PHACO Right 11/24/2015   Procedure: CATARACT EXTRACTION PHACO AND INTRAOCULAR LENS PLACEMENT (IOC);  Surgeon: Sallee Lange, MD;  Location: [facility] ORS;  Service: Ophthalmology;  Laterality: Right;  [place] 01:14 AP% 25.5 CDE 30.69 fluid pack lot # [id number]   CATARACT EXTRACTION W/PHACO Left 01/06/2021   Procedure: CATARACT EXTRACTION PHACO AND INTRAOCULAR LENS PLACEMENT (IOC) LEFT DIABETIC 6.98 00:40.1;   Surgeon: Galen Manila, MD;  Location: [facility];  Service: Ophthalmology;  Laterality: Left;  Diabetic - oral meds   COLONOSCOPY     COLONOSCOPY WITH PROPOFOL N/A 07/24/2019   Procedure: COLONOSCOPY WITH PROPOFOL;  Surgeon: Toledo, Boykin Nearing, MD;  Location: [facility];  Service: Gastroenterology;  Laterality: N/A;   ESOPHAGOGASTRODUODENOSCOPY N/A 07/24/2019   Procedure: ESOPHAGOGASTRODUODENOSCOPY (EGD);  Surgeon: Toledo, Boykin Nearing, MD;  Location: [facility] END[facility];  Service: Gastroenterology;  Laterality: N/A;   OOPHORECTOMY      Allergies: Allergies as of 02/16/2023   (No Known Allergies)    Medications: Current Meds  Medication Sig   acetaminophen (TYLENOL) 500 MG tablet Take 2 tablets (1,000 mg total) by mouth 3 (three) times daily as needed for mild pain, moderate pain or headache.   allopurinol (ZYLOPRIM) 100 MG tablet Take 100 mg by mouth daily.   Amantadine HCl 100 MG tablet Take 100 mg by mouth 2 (two) times daily.   aspirin EC 81 MG tablet Take 1 tablet (81 mg total) by mouth daily.   busPIRone (BUSPAR) 10 MG tablet Take 10 mg by mouth 2 (two) times daily.   carbidopa-levodopa (SINEMET IR) 25-100 MG tablet Take 2.5 tablets by mouth 4 (four) times daily.   diclofenac Sodium (VOLTAREN) 1 % GEL Apply 4 g topically 4 (four) times daily.   entacapone (COMTAN) 200 MG tablet Take 100 mg by mouth 3 (three) times daily.  fluticasone (FLONASE) 50 MCG/ACT nasal spray Place 1-2 sprays into the nose daily as needed for allergies.   gabapentin (NEURONTIN) 300 MG capsule Take 300 mg by mouth at bedtime.   glucose 4 GM chewable tablet Chew 1 tablet by mouth as needed for low blood sugar.   levothyroxine (SYNTHROID) 75 MCG tablet Take 75 mcg by mouth daily.   metFORMIN (GLUCOPHAGE) 1000 MG tablet Take 1,000 mg by mouth 2 (two) times daily with a meal.   metolazone (ZAROXOLYN) 2.5 MG tablet Take 2.5 mg by mouth daily.   metoprolol succinate (TOPROL-XL) 50 MG 24 hr tablet Take 50  mg by mouth daily. Take with or immediately following a meal.   Multiple Vitamin (MULTIVITAMIN WITH MINERALS) TABS tablet Take 1 tablet by mouth daily.   oxyCODONE-acetaminophen (PERCOCET/ROXICET) 5-325 MG tablet Take 1 tablet by mouth 3 (three) times daily.   pantoprazole (PROTONIX) 40 MG tablet Take 40 mg by mouth daily.   polyethylene glycol (MIRALAX) 17 g packet Take 17 g by mouth daily.   potassium chloride (KLOR-CON) 10 MEQ tablet Take 10 mEq by mouth once daily as needed with torsemide.   pramipexole (MIRAPEX) 0.125 MG tablet Take 0.125 mg by mouth 4 (four) times daily.   pravastatin (PRAVACHOL) 40 MG tablet Take 40 mg by mouth daily.   QUEtiapine (SEROQUEL) 100 MG tablet Take 100 mg by mouth at bedtime.   tiZANidine (ZANAFLEX) 4 MG tablet Take 4 mg by mouth 3 (three) times daily.   torsemide (DEMADEX) 20 MG tablet Take 20 mg by mouth daily as needed (fluid).    Social History: Social History   Tobacco Use   Smoking status: Never   Smokeless tobacco: Never  Vaping Use   Vaping Use: Never used  Substance Use Topics   Alcohol use: Not Currently    Comment: rare   Drug use: No    Family Medical History: Family History  Problem Relation Age of Onset   Breast cancer Paternal Aunt     Physical Examination: Vitals:   02/17/23 1240 02/17/23 1400  BP: (!) 170/70 (!) 123/51  Pulse: 66 71  Resp: 19 17  Temp:    SpO2: 99% 97%    General: Patient is very thin. She is calm.  She answers questions.  Neck:   Supple.  Full range of motion.  Respiratory: Patient is breathing without any difficulty.  She has significant redness of her shins and multiple different skin lesions.  Her skin appears to be very sensitive. NEUROLOGICAL:     Awake, alert, oriented to person, place, and time.  Speech is clear and fluent.  Cranial Nerves: Pupils equal round and reactive to light.  Facial tone is symmetric.  Facial sensation is symmetric. Shoulder shrug is symmetric. Tongue protrusion  is midline.   full.    Strength: She has 5 out of 5 strength in her bilateral upper extremities.  In her lower extremity, she is very stiff but has at least 3/5 strength.  She has pain with passive range of motion of her right leg.   Reflexes are 1+ and symmetric at the biceps, triceps, brachioradialis, 3+ patella and achilles.   Hoffman's is absent.   Bilateral upper and lower extremity sensation is symmetric to light touch.    No evidence of dysmetria noted.  Gait is untested.     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 Severe spinal stenosis at T10-11 and T11-12 with mild spinal cord signal abnormality/edema.     Electronically Signed   By: Sebastian Ache M.D.   On: 02/17/2023 14:05  CT T spine 02/17/2023 IMPRESSION: 1. Multilevel severe degenerative endplate changes in the lower cervical spine, at T1-2, T3-4, and from T9-10 through L1-2. 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 2. Facet arthropathy and gouty tophi contribute to severe spinal canal stenosis and severe bilateral neural foraminal narrowing at T10-11, as well as moderate spinal canal stenosis and severe bilateral neural foraminal narrowing at T11-12. 3. Aortic Atherosclerosis (ICD10-I70.0).     Electronically Signed   By: Orvan Falconer M.D.   On: 02/17/2023 10:00  I have personally reviewed the images and agree with the above interpretation.  Assessment and Plan: Stephanie [name] is a pleasant 77 y.o. 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  She appears  to be very symptomatic from this.  She has not walked in approximately 4 days.   She has substantial erosive changes in her spine worst between T10 and 12.  She also has severe stenosis in these 2 levels.  She is symptomatic from thoracic stenosis.  Unfortunately, she appears to be in poor health.  Surgical intervention would be a relatively large and involved intervention including decompression and fusion.  At this point, she has multiple open sores.  I am concerned that she would not be an appropriate candidate for the surgery and that her underlying medical conditions and current presentation would leave her at high risk of postoperative complications.  I recommended involvement of palliative care.    I have communicated my recommendations to the requesting physician and coordinated care to facilitate these recommendations.     Stephanie [name] K. Myer Haff MD, [facility]

## 2023-02-17 NOTE — H&P (Addendum)
[place]   PATIENT NAME: Stephanie [name]    MR#:  [id number]  DATE OF BIRTH:  05-02-1946  DATE OF ADMISSION:  02/16/2023  PRIMARY CARE PHYSICIAN: Marguarite Arbour, MD   Patient is coming from: Home  REQUESTING/REFERRING PHYSICIAN: Chiquita Loth, MD  CHIEF COMPLAINT:   Chief Complaint  Patient presents with   Hip Pain    HISTORY OF PRESENT ILLNESS:  Stephanie [name] is a 77 y.o. female with medical history significant for CHF, type diabetes mellitus, GERD, hypertension, dyslipidemia, hypothyroidism, Parkinson's disease, OSA and osteoarthritis, who presented to the emergency room with acute onset of right hip pain which has been worsening over the last couple months per EMS.  Until 3 days ago the patient was able to use her walker to get around in her house and to go to the bathroom by himself.  She did not have any falls or injuries.  She became unable to walk or move her legs with associated right hip and back pain.  She was seen by her PCP and was given ibuprofen and Percocet which initially helped but is not currently.  No fever or chills.  No nausea or vomiting or abdominal pain.  No chest pain or palpitations.  She denies any headache or dizziness or blurred vision.  She has been having some problems with swallowing.  No focal muscle weakness.  No worsening erythema of the lower extremities or pain.  No reported dyspnea, orthopnea or paroxysmal nocturnal dyspnea or worsening lower extremity edema.  ED Course: When she came to the ER, BP was 162/78 with otherwise normal vital signs.  Labs revealed UA with 30 protein and was otherwise unremarkable.  CBC showed no abnormalities.  CMP revealed hypokalemia of 2.8 and a BUN of 48 with creatinine 1.03, calcium 8.4 and albumin 3.3 with total protein 6.4.  High sensitive troponin I was 19 and later 21. EKG as reviewed by me : EKG showed sinus rhythm with a rate of 65 with PACs and left bundle branch block. Imaging: Portable chest  ray showed cardiomegaly and pulm vascular congestion with mild interstitial pulmonary edema.  The patient was given 0.5 mg of IV Ativan, 500 mill IV normal saline bolus, 10 mill [place] on IV potassium chloride, 50 mcg of IV fentanyl and 1 g of p.o. Tylenol.  She will be admitted to a medical observation bed for further evaluation and management. PAST MEDICAL HISTORY:   Past Medical History:  Diagnosis Date   Anemia    Arthritis    CHF (congestive heart failure)    Chronic back pain    Diabetes mellitus without complication    Dysrhythmia    GERD (gastroesophageal reflux disease)    Hyperlipidemia    Hypertension    Hypothyroid    Multilevel degenerative disc disease    Parkinson's disease    Sleep apnea    Syncope    Tremors of nervous system    Wears dentures    full upper and lower    PAST SURGICAL HISTORY:   Past Surgical History:  Procedure Laterality Date   ABDOMINAL HYSTERECTOMY     BACK SURGERY  09/16   CARDIAC CATHETERIZATION     CATARACT EXTRACTION W/PHACO Right 11/24/2015   Procedure: CATARACT EXTRACTION PHACO AND INTRAOCULAR LENS PLACEMENT (IOC);  Surgeon: Sallee Lange, MD;  Location: [facility];  Service: Ophthalmology;  Laterality: Right;  [place] 01:14 AP% 25.5 CDE 30.69 fluid pack lot # [id number]   CATARACT EXTRACTION  W/PHACO Left 01/06/2021   Procedure: CATARACT EXTRACTION PHACO AND INTRAOCULAR LENS PLACEMENT (IOC) LEFT DIABETIC 6.98 00:40.1;  Surgeon: Galen Manila, MD;  Location: [facility];  Service: Ophthalmology;  Laterality: Left;  Diabetic - oral meds   COLONOSCOPY     COLONOSCOPY WITH PROPOFOL N/A 07/24/2019   Procedure: COLONOSCOPY WITH PROPOFOL;  Surgeon: Toledo, Boykin Nearing, MD;  Location: [facility];  Service: Gastroenterology;  Laterality: N/A;   ESOPHAGOGASTRODUODENOSCOPY N/A 07/24/2019   Procedure: ESOPHAGOGASTRODUODENOSCOPY (EGD);  Surgeon: Toledo, Boykin Nearing, MD;  Location: [facility];  Service: Gastroenterology;  Laterality:  N/A;   OOPHORECTOMY      SOCIAL HISTORY:   Social History   Tobacco Use   Smoking status: Never   Smokeless tobacco: Never  Substance Use Topics   Alcohol use: Not Currently    Comment: rare    FAMILY HISTORY:   Family History  Problem Relation Age of Onset   Breast cancer Paternal Aunt     DRUG ALLERGIES:  No Known Allergies  REVIEW OF SYSTEMS:   ROS As per history of present illness. All pertinent systems were reviewed above. Constitutional, HEENT, cardiovascular, respiratory, GI, GU, musculoskeletal, neuro, psychiatric, endocrine, integumentary and hematologic systems were reviewed and are otherwise negative/unremarkable except for positive findings mentioned above in the HPI.   MEDICATIONS AT HOME:   Prior to Admission medications   Medication Sig Start Date End Date Taking? Authorizing Provider  Amantadine HCl 100 MG tablet Take 50-100 mg by mouth 2 (two) times daily. Taken 1/2 tablet (50 mg total) by mouth two times a day for one week, then take 1 tablet (100 mg total) by mouth two times a day 01/27/23  Yes [provider]  acetaminophen (TYLENOL) 500 MG tablet Take 2 tablets (1,000 mg total) by mouth 3 (three) times daily as needed for mild pain, moderate pain or headache. 10/11/22   Darlin Priestly, MD  allopurinol (ZYLOPRIM) 100 MG tablet Take 100 mg by mouth daily.    [provider]  aspirin EC 81 MG tablet Take 1 tablet (81 mg total) by mouth daily. 02/16/22   [name], MD  busPIRone (BUSPAR) 10 MG tablet Take 10 mg by mouth 2 (two) times daily. 07/22/22 07/22/23  [provider]  carbidopa-levodopa (SINEMET CR) 50-200 MG tablet Take 1 tablet by mouth at bedtime.    [provider]  carbidopa-levodopa (SINEMET IR) 25-100 MG tablet Take 2.5 tablets by mouth 4 (four) times daily.    [provider]  Continuous Blood Gluc Sensor (FREESTYLE LIBRE 14 DAY SENSOR) MISC by Does not apply route.    [provider]   diclofenac Sodium (VOLTAREN) 1 % GEL Apply 4 g topically 4 (four) times daily. 10/11/22   Darlin Priestly, MD  entacapone (COMTAN) 200 MG tablet Take 100 mg by mouth 3 (three) times daily.    [provider]  fluticasone (FLONASE) 50 MCG/ACT nasal spray Place 1-2 sprays into the nose daily as needed for allergies. 09/29/15   [provider]  gabapentin (NEURONTIN) 300 MG capsule Take 300 mg by mouth at bedtime.    [provider]  glucose 4 GM chewable tablet Chew 1 tablet by mouth as needed for low blood sugar.    [provider]  levothyroxine (SYNTHROID) 75 MCG tablet Take 75 mcg by mouth daily. 07/20/21 07/20/22  [provider]  losartan (COZAAR) 100 MG tablet Take 1 tablet (100 mg total) by mouth daily. 01/07/22 04/07/22  Delma Freeze, FNP  metFORMIN (GLUCOPHAGE) 1000 MG tablet Take 1,000 mg by mouth 2 (two) times daily with a meal.    [provider]  metolazone (ZAROXOLYN) 2.5 MG tablet Take 2.5 mg by mouth daily. 02/02/23   [provider]  metoprolol succinate (TOPROL-XL) 50 MG 24 hr tablet Take 50 mg by mouth daily. Take with or immediately following a meal.    [provider]  Multiple Vitamin (MULTIVITAMIN WITH MINERALS) TABS tablet Take 1 tablet by mouth daily.    [provider]  oxyCODONE-acetaminophen (PERCOCET/ROXICET) 5-325 MG tablet Take 1 tablet by mouth 3 (three) times daily. 02/04/23   [provider]  pantoprazole (PROTONIX) 40 MG tablet Take 40 mg by mouth daily.    [provider]  penicillin v potassium (VEETID) 500 MG tablet Take 1 tablet (500 mg total) by mouth 3 (three) times daily. 12/23/22   Georgiana Spinner, NP  polyethylene glycol (MIRALAX) 17 g packet Take 17 g by mouth daily. 12/16/22   Phineas Semen, MD  potassium chloride (KLOR-CON) 10 MEQ tablet Take 10 mEq by mouth once daily as needed with torsemide. 09/03/22   [provider]  pramipexole (MIRAPEX) 0.125 MG tablet  Take 0.125 mg by mouth 4 (four) times daily.    [provider]  pravastatin (PRAVACHOL) 40 MG tablet Take 40 mg by mouth daily.    [provider]  QUEtiapine (SEROQUEL) 100 MG tablet Take 100 mg by mouth at bedtime. 02/03/22   [provider]  sertraline (ZOLOFT) 25 MG tablet Take 1 tablet (25 mg total) by mouth daily. 09/08/21   Jerald Kief, MD  tiZANidine (ZANAFLEX) 4 MG tablet Take 4 mg by mouth 3 (three) times daily. 12/08/22   [provider]  torsemide (DEMADEX) 20 MG tablet Take 20 mg by mouth daily as needed (fluid). 10/06/21   [provider]  glimepiride (AMARYL) 4 MG tablet Take 4 mg by mouth 2 (two) times daily. Pt. Takes 1 tablet in the morning and .5 tablet at night.  12/05/20  [provider]      VITAL SIGNS:  Blood pressure (!) 204/105, pulse 78, temperature 98.1 F (36.7 C), temperature source Oral, resp. rate 15, height 5' (1.524 m), weight 61.3 kg, SpO2 100 %.  PHYSICAL EXAMINATION:  Physical Exam  GENERAL:  77 y.o.-year-old Caucasian female patient lying in the bed with no acute distress.  EYES: Pupils equal, round, reactive to light and accommodation. No scleral icterus. Extraocular muscles intact.  HEENT: Head atraumatic, normocephalic. Oropharynx and nasopharynx clear.  NECK:  Supple, no jugular venous distention. No thyroid enlargement, no tenderness.  LUNGS: Slightly diminished breath sounds bilaterally, no wheezing, rales,rhonchi or crepitation. No use of accessory muscles of respiration.  CARDIOVASCULAR: Regular rate and rhythm, S1, S2 normal. No murmurs, rubs, or gallops.  ABDOMEN: Soft, nondistended, nontender. Bowel sounds present. No organomegaly or mass.  EXTREMITIES: No pedal edema, cyanosis, or clubbing.  Both legs were erythematous without tenderness or induration and with minor ulcerations. NEUROLOGIC: Cranial nerves II through XII are intact. Muscle strength 5/5 in all extremities. Sensation intact.  Gait not checked.  PSYCHIATRIC: The patient is alert and oriented x 3.  Normal affect and good eye contact. SKIN: As above and extremities  LABORATORY PANEL:   CBC Recent Labs  Lab 02/17/23 0440  WBC 8.3  HGB 12.5  HCT 38.6  PLT 302   ------------------------------------------------------------------------------------------------------------------  Chemistries  Recent Labs  Lab 02/16/23 1850 02/17/23 0440  NA 139 140  K 2.8* 2.8*  CL 101 103  CO2 27 28  GLUCOSE 104* 92  BUN 45* 34*  CREATININE 1.03* 0.68  CALCIUM 8.4* 8.5*  AST 23  --   ALT <5  --   ALKPHOS 91  --   BILITOT 1.1  --    ------------------------------------------------------------------------------------------------------------------  Cardiac Enzymes No results for input(s): "TROPONINI" in the last 168 hours. ------------------------------------------------------------------------------------------------------------------  RADIOLOGY:  MR LUMBAR SPINE WO CONTRAST  Result Date: 02/17/2023 CLINICAL DATA:  Low back pain EXAM: MRI LUMBAR SPINE WITHOUT CONTRAST TECHNIQUE: Multiplanar, multisequence MR imaging of the lumbar spine was performed. No intravenous contrast was administered. COMPARISON:  01/27/2016 FINDINGS: Segmentation:  Standard. Alignment: Grade 1 anterolisthesis at T11-12. Grade 1 retrolisthesis at L2-3 and L3-4. Vertebrae: Compression deformity of T11 with superior osteonecrotic cleft. No acute fracture. Conus medullaris and cauda equina: Conus extends to the L2 level. Conus and cauda equina appear normal. Paraspinal and other soft tissues: Negative Disc levels: Axial images are markedly motion degraded. T9-10: Unremarkable. T10-11: Moderate facet hypertrophy with left facet spurring that encroaches on the left dorsal thecal sac. Endplate ridging. Moderate spinal canal stenosis. Severe bilateral foraminal stenosis. T11-12: Disc bulge with superimposed right subarticular protrusion. Mild spinal canal  stenosis. Severe bilateral foraminal stenosis. T12-L1: Small central disc protrusion without spinal canal stenosis. L1-L2: Disc space narrowing with small bulge and endplate ridging. No spinal canal stenosis. No neural foraminal stenosis. L2-L3: Small disc bulge with endplate ridging. No spinal canal stenosis. No neural foraminal stenosis. L3-L4: Small disc bulge with mild facet hypertrophy and endplate ridging. No spinal canal stenosis. Moderate bilateral neural foraminal stenosis. L4-L5: Medium-sized disc bulge with moderate facet hypertrophy. Mild spinal canal stenosis. Severe bilateral neural foraminal stenosis. L5-S1: Small disc bulge. No spinal canal stenosis. Moderate bilateral neural foraminal stenosis. Visualized sacrum: Normal. IMPRESSION: 1. Motion degraded examination. Progression of multilevel degenerative disc disease since 01/27/16. 2. Subacute wedge compression fracture of T11. 3. Moderate T10-11 and mild T11-12 spinal canal stenosis with severe bilateral neural foraminal stenosis at both levels. 4. Mild L4-5 spinal canal stenosis and severe bilateral neural foraminal stenosis. 5. Moderate bilateral L3-4 and L5-S1 neural foraminal stenosis. Electronically Signed   By: Deatra Robinson M.D.   On: 02/17/2023 00:56   CT Hip Right Wo Contrast  Result Date: 02/16/2023 CLINICAL DATA:  Hip pain, stress fracture suspected, neg xray Right hip pain for 2 months. EXAM: CT OF THE RIGHT HIP WITHOUT CONTRAST TECHNIQUE: Multidetector CT imaging of the right hip was performed according to the standard protocol. Multiplanar CT image reconstructions were also generated. RADIATION DOSE REDUCTION: This exam was performed according to the departmental dose-optimization program which includes automated exposure control, adjustment of the mA and/or kV according to patient size and/or use of iterative reconstruction technique. COMPARISON:  Radiograph earlier today. Included portions from abdominopelvic CT 12/16/2022  FINDINGS: Bones/Joint/Cartilage No fracture. No periostitis to suggest stress reaction on CT. Mild hip degenerative change with acetabular spurring and subchondral cyst. No evidence of a vascular necrosis, erosion or focal bone lesions. No bony destructive change. There may be minimal hip joint effusion. Ligaments Suboptimally assessed by CT. Muscles and Tendons Slightly atrophic but no focal abnormality. Soft tissues There is subcutaneous soft tissue edema. Marked distention of the urinary bladder is partially included. Large volume of stool in the included colon. IMPRESSION: 1. Mild hip degenerative change. No acute findings. 2. Marked distention of the urinary bladder is partially included. Large volume of stool in the included colon. 3. Generalized subcutaneous  edema, nonspecific. Electronically Signed   By: Narda Rutherford M.D.   On: 02/16/2023 21:56   DG Chest 1 View  Result Date: 02/16/2023 CLINICAL DATA:  Weakness history of CHF EXAM: CHEST  1 VIEW COMPARISON:  10/08/2022 FINDINGS: Stable cardiomegaly. Aortic atherosclerotic calcification. Pulmonary vascular congestion and diffuse bilateral interstitial coarsening. Linear opacities in the left mid lung may be due to atelectasis or scarring. No pleural effusion or pneumothorax. No displaced rib fractures. IMPRESSION: 1. Cardiomegaly and pulmonary vascular congestion with mild interstitial edema. Electronically Signed   By: Minerva Fester M.D.   On: 02/16/2023 19:55   DG Hand Complete Left  Result Date: 02/16/2023 CLINICAL DATA:  Pain after trauma. EXAM: LEFT HAND - COMPLETE 3 VIEW COMPARISON:  None Available. FINDINGS: No obvious fracture or dislocation. Preserved joint spaces and bone mineralization. Minimal osteophytes along the interphalangeal joint of the thumb. Evaluation limited as the fingers are flexed on all views in the overlapping on the lateral view. IMPRESSION: Mild degenerative changes. No acute osseous abnormality. Limited views.  Electronically Signed   By: Karen Kays M.D.   On: 02/16/2023 18:31   DG Hip Unilat W or Wo Pelvis 2-3 Views Right  Result Date: 02/16/2023 CLINICAL DATA:  Right-sided hip pain for 2 months EXAM: DG HIP (WITH OR WITHOUT PELVIS) 3V RIGHT COMPARISON:  None Available. FINDINGS: Osteopenia. No fracture or dislocation. Slight joint space loss of the right hip. Mild sclerosis along the sacroiliac joints. Degenerative changes seen of the lumbar spine at the edge of the imaging field. There are some rounded densities in the pelvis which are indeterminate although possibly vascular. Note is made of diffuse colonic stool. The areas of bony sclerosis seen on the patient's prior CT scan of the abdomen and pelvis of 12/16/2022 not as well appreciated by this x-ray. Please correlate with specific history and prior workup IMPRESSION: Osteopenia with mild degenerative changes. Significant colonic stool Electronically Signed   By: Karen Kays M.D.   On: 02/16/2023 18:29      IMPRESSION AND PLAN:  Assessment and Plan: * Unable to ambulate - The patient will be admitted to an observation medical bed. - This like secondary to right hip and back pain. - Pain management will be provided. - PT evaluation will be obtained to assist with ambulation. - Hypokalemia will be replaced.  Acute on chronic HFrEF (heart failure with reduced ejection fraction) - Will diurese with IV Lasix. - Will monitor ins and outs and daily weight.  Hypertensive urgency - We will continue antihypertensives and place the patient on as needed IV labetalol. - This could be contributing to #2.  Parkinson's disease - We will continue Sinemet IR and CR, Comtan, Neurontin, amantadine and Mirapex.  Hypokalemia - Potassium will be replaced and magnesium level will be checked.  Type 2 diabetes mellitus with peripheral neuropathy - The patient will be placed on supplement coverage with NovoLog. - We will hold off metformin. - We will  continue Neurontin.  GERD without esophagitis - We will continue PPI therapy.  Gout - We will continue allopurinol.       DVT prophylaxis: Lovenox.  Advanced Care Planning:  Code Status: full code.  Family Communication:  The plan of care was discussed in details with the patient (and family). I answered all questions. The patient agreed to proceed with the above mentioned plan. Further management will depend upon hospital course. Disposition Plan: Back to previous home environment Consults called: none.  All the records are reviewed  and case discussed with ED provider.  Status is: Observation.  I certify that at the time of admission, it is my clinical judgment that the patient will require hospital care extending LESS than 2 midnights.                            Dispo: The patient is from: Home              Anticipated d/c is to: Home              Patient currently is not medically stable to d/c.              Difficult to place patient: No  Hannah Beat M.D on 02/17/2023 at 6:30 AM  [facility]   From 7 PM-7 AM, contact night-coverage [email or web address]  CC: Primary care physician; Marguarite Arbour, MD

## 2023-02-17 NOTE — Assessment & Plan Note (Signed)
-   Will diurese with IV Lasix. - Will monitor ins and outs and daily weight.

## 2023-02-17 NOTE — Consult Note (Addendum)
WOC Nurse Consult Note: Reason for Consult: Consult requested for bilat legs.  Performed remotely after review of progress notes and photos in the EMR.  Bilat legs with generalized edema and erythremia.  Scattered areas of red moist partial thickness skin loss and left anterior calf with red moist full thickness abrasion. Dressing procedure/placement/frequency: Topical treatment orders provided for bedside nurses to perform as follows to promote drying and healing: Apply Xeroform gauze to bilat leg wounds Q day, then cover with ABD pads and wrap with kerlex, beginning just behind toes to below knees in a spiral fashion. Please re-consult if further assistance is needed.  Thank-you,  Cammie Mcgee MSN, RN, CWOCN, [place], CNS [phone]

## 2023-02-17 NOTE — Assessment & Plan Note (Addendum)
-   We will continue allopurinol 

## 2023-02-17 NOTE — Assessment & Plan Note (Signed)
-   We will continue Sinemet IR and CR, Comtan, Neurontin, amantadine and Mirapex.

## 2023-02-17 NOTE — Assessment & Plan Note (Signed)
-   Potassium will be replaced and magnesium level will be checked. ?

## 2023-02-17 NOTE — Progress Notes (Addendum)
Patient admitted this morning for evaluation of right hip and back pain. Patient has underlying Parkinson's disease and states that she has had several falls. Patient had an MRI of the lumbar spine which showed Progression of multilevel degenerative disc disease since 01/27/16. Subacute wedge compression fracture of T11. Moderate T10-11 and mild T11-12 spinal canal stenosis with severe bilateral neural foraminal stenosis at both levels. Mild L4-5 spinal canal stenosis and severe bilateral neural foraminal stenosis. Moderate bilateral L3-4 and L5-S1 neural foraminal stenosis.  Patient seen and examined at the bedside appears very frail and lethargic but arouses to verbal stimuli Noted to have redness involving both lower extremities with multiple skin tears.  Redness may be related to chronic venous stasis as patient is afebrile and has no leukocytosis but will treat empirically for possible cellulitis with Ancef. Will request wound care consult Continue pain control Neurosurgery consult

## 2023-02-17 NOTE — Assessment & Plan Note (Signed)
-   We will continue PPI therapy 

## 2023-02-18 DIAGNOSIS — Z515 Encounter for palliative care: Secondary | ICD-10-CM

## 2023-02-18 DIAGNOSIS — R262 Difficulty in walking, not elsewhere classified: Secondary | ICD-10-CM | POA: Diagnosis not present

## 2023-02-18 DIAGNOSIS — G20B2 Parkinson's disease with dyskinesia, with fluctuations: Secondary | ICD-10-CM

## 2023-02-18 DIAGNOSIS — M545 Low back pain, unspecified: Secondary | ICD-10-CM

## 2023-02-18 DIAGNOSIS — M25551 Pain in right hip: Secondary | ICD-10-CM | POA: Diagnosis not present

## 2023-02-18 DIAGNOSIS — L03119 Cellulitis of unspecified part of limb: Secondary | ICD-10-CM | POA: Diagnosis not present

## 2023-02-18 DIAGNOSIS — M5137 Other intervertebral disc degeneration, lumbosacral region: Secondary | ICD-10-CM

## 2023-02-18 DIAGNOSIS — Z7189 Other specified counseling: Secondary | ICD-10-CM

## 2023-02-18 LAB — GLUCOSE, CAPILLARY
Glucose-Capillary: 123 mg/dL — ABNORMAL HIGH (ref 70–99)
Glucose-Capillary: 192 mg/dL — ABNORMAL HIGH (ref 70–99)
Glucose-Capillary: 162 mg/dL — ABNORMAL HIGH (ref 70–99)
Glucose-Capillary: 134 mg/dL — ABNORMAL HIGH (ref 70–99)

## 2023-02-18 LAB — BRAIN NATRIURETIC PEPTIDE: B Natriuretic Peptide: 261 pg/mL — ABNORMAL HIGH (ref 0.0–100.0)

## 2023-02-18 MED ORDER — OXYCODONE-ACETAMINOPHEN 7.5-325 MG PO TABS
1.0000 | ORAL_TABLET | ORAL | Status: DC | PRN
  Administered 2023-02-18 – 2023-02-24 (×15): 1 via ORAL
  Filled 2023-02-18 (×16): qty 1

## 2023-02-18 MED ORDER — POTASSIUM CHLORIDE 20 MEQ PO PACK
40.0000 meq | PACK | Freq: Two times a day (BID) | ORAL | Status: AC
  Administered 2023-02-18 (×2): 40 meq via ORAL
  Filled 2023-02-18 (×2): qty 2

## 2023-02-18 MED ORDER — MORPHINE SULFATE (PF) 2 MG/ML IV SOLN
2.0000 mg | INTRAVENOUS | Status: DC | PRN
  Administered 2023-02-19: 2 mg via INTRAVENOUS
  Filled 2023-02-18 (×3): qty 1

## 2023-02-18 MED ORDER — AMANTADINE HCL 100 MG PO CAPS
100.0000 mg | ORAL_CAPSULE | Freq: Two times a day (BID) | ORAL | Status: DC
  Administered 2023-02-18 – 2023-02-22 (×9): 100 mg via ORAL
  Filled 2023-02-18 (×9): qty 1

## 2023-02-18 NOTE — Evaluation (Signed)
Clinical/Bedside Swallow Evaluation Patient Details  Name: Stephanie [name] MRN: [id number] Date of Birth: November 01, 1946  Today's Date: 02/18/2023 Time: SLP Start Time (ACUTE ONLY): 1015 SLP Stop Time (ACUTE ONLY): 1055 SLP Time Calculation (min) (ACUTE ONLY): 40 min  Past Medical History:  Past Medical History:  Diagnosis Date   Anemia    Arthritis    CHF (congestive heart failure)    Chronic back pain    Diabetes mellitus without complication    Dysrhythmia    GERD (gastroesophageal reflux disease)    Hyperlipidemia    Hypertension    Hypothyroid    Multilevel degenerative disc disease    Parkinson's disease    Sleep apnea    Syncope    Tremors of nervous system    Wears dentures    full upper and lower   Past Surgical History:  Past Surgical History:  Procedure Laterality Date   ABDOMINAL HYSTERECTOMY     BACK SURGERY  09/16   CARDIAC CATHETERIZATION     CATARACT EXTRACTION W/PHACO Right 11/24/2015   Procedure: CATARACT EXTRACTION PHACO AND INTRAOCULAR LENS PLACEMENT (IOC);  Surgeon: Sallee Lange, MD;  Location: [facility];  Service: Ophthalmology;  Laterality: Right;  [place] 01:14 AP% 25.5 CDE 30.69 fluid pack lot # [id number]   CATARACT EXTRACTION W/PHACO Left 01/06/2021   Procedure: CATARACT EXTRACTION PHACO AND INTRAOCULAR LENS PLACEMENT (IOC) LEFT DIABETIC 6.98 00:40.1;  Surgeon: Galen Manila, MD;  Location: [facility];  Service: Ophthalmology;  Laterality: Left;  Diabetic - oral meds   COLONOSCOPY     COLONOSCOPY WITH PROPOFOL N/A 07/24/2019   Procedure: COLONOSCOPY WITH PROPOFOL;  Surgeon: Toledo, Boykin Nearing, MD;  Location: [facility];  Service: Gastroenterology;  Laterality: N/A;   ESOPHAGOGASTRODUODENOSCOPY N/A 07/24/2019   Procedure: ESOPHAGOGASTRODUODENOSCOPY (EGD);  Surgeon: Toledo, Boykin Nearing, MD;  Location: [facility];  Service: Gastroenterology;  Laterality: N/A;   OOPHORECTOMY     HPI:  Pt is a 77 y.o. female with medical history  significant for Parkinson's Dis w/ tremors of nervous system, CHF, type diabetes mellitus, GERD, hypertension, dyslipidemia, hypothyroidism, Multilevel degenerative disc disease     OSA and osteoarthritis, who presented to the emergency room with acute onset of right hip pain which has been worsening over the last couple months per EMS.  Until 3 days ago the patient was able to use her walker to get around in her house and to go to the bathroom by himself.  She did not have any falls or injuries.  She became unable to walk or move her legs with associated right hip and back pain.  She was seen by her PCP and was given ibuprofen and Percocet which initially helped but is not currently.    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  She appears to be very symptomatic from this.  She has not walked in approximately 4 days.  She has substantial erosive changes in her spine worst between T10 and 12.  She also has severe stenosis in these 2 levels.  She is symptomatic from thoracic stenosis.  Unfortunately, she appears to be in poor health.  Surgical intervention would be a relatively large and involved intervention including decompression and fusion.  CXR: Cardiomegaly and pulmonary vascular congestion with mild  interstitial edema.    Assessment / Plan / Recommendation  Clinical Impression   Pt seen today for BSE. Pt awake, verbal and engaged in conversation w/ this SLP and then  Palliative care. Noted involuntary head movements; Baseline Parkinson's Disease. Pt's vocal quality was a little shaky w/ low volume at Baseline. Slightly wet vocal quality at rest during talking - saliva?  Noted pt's h/o MBSSin 01/2022: "Pt presents with a mild pharyngeal dysphagia, suspect similar in function to previous MBS. She does have swallow initiation at times at the pyriform sinuses with thin liquids, which did result in a single instance  of trace, silent aspiration before the swallow with thin liquids via straw. Minimal residue was noted in the valleculae post-swallow, but was often reduced spontaneously. Of note, during esophageal sweep, pt was observed to have barium remaining in the esophagus including the barium pill. Pt reports feeling like things often get stuck or come back up. Question if her risk for aspiration may also be increased post-prandially from suspected esophageal issues. Would consider a mroe dedicated assessment of the esophagus if there is ongoing concern for aspiration. For now will leave on Dys 3 diet and thin liquids with upright positioning and small, single sips.".  Pt on RA; afebrile. WBC WNL. CXR was NOT concerning for pneumonia at admit - see Imaging. 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 See further in note.  Pt appears to present w/ grossly functional oropharyngeal phase swallowing w/ No overt oropharyngeal phase dysphagia noted, no decline in status from her Baseline during/post trials. Pt consumed few po trials w/ No immediate, overt clinical s/s of aspiration during po trials.  Pt appears at reduced risk for aspiration following general aspiration precautions, given positioning support, assistance w/ feeding as needed d/t weakness, and using a slightly modified diet consistency for ease of intake/conservation of energy.   However, pt does have challenging factors that could impact her oropharyngeal swallowing to include Pain/discomfort when sitting Up in bed/chair(NSG aware), fatigue/weakness/deconditioning, and limited self-feeding abilities. Pt does have h/o pharyngoesophageal phase dysphagia per MBSS in 01/2022. These factors can increase risk for aspiration, dysphagia as well as decreased oral intake overall.   During few  po trials, pt consumed thin liquids via Straw feeding self w/ no immediate, overt coughing, decline in vocal quality, or change in respiratory presentation during/post trials. O2 sats remained 98%. Pt did exhibit a slight+ wet vocal quality when talking b/t trials -- similar to presentation at Baseline. This did not increase in frequency nor intensity(saliva mixed w/ liquid residue?). Oral phase appeared [facility] w/ timely bolus management and control of bolus propulsion for A-P transfer for swallowing. Oral clearing achieved w/ trials. NSG reported no overt difficulty swallowing at breakfast meal this morning when assisted by NSG staff -- pt consumed soft solids during that meal. This was also noted in NSG note yesterday. Pt herself stated she ate a "full" breakfast and DENIED any difficulty swallowing during it(as NSG reported).  OM Exam appeared [facility] w/ no unilateral weakness noted. Speech grossly intelligible in setting of low volume(Parkinson's Dis). Pt fed self by holding Cup when drinking.  She declined all food trials offered.   Recommend continue a fairly regular/Mech Soft consistency diet w/ well-Cut meats, moistened foods for ease of ordering/Pleasure; Thin liquids -- carefully monitor. Recommend aspiration precautions, Pills WHOLE vs CRUSHED in Puree for safer, easier swallowing. Careful Upright positioning w/ ANY oral intake. Support w/ feeding as needed for conservation of energy -- pt to hold Cup when drinking though. Education given on Pills  in Puree; food consistencies and easy to eat options; general aspiration precautions to pt and Palliative Care NP present(who agreed), then MD. MD to reconsult if any new needs arise. NSG updated, fully agreed. Recommend Dietician f/u for support. Palliative Care onoing.  SLP Visit Diagnosis: Dysphagia, unspecified (R13.10);Dysphagia, pharyngoesophageal phase (R13.14) (baseline)    Aspiration Risk  Mild aspiration risk;Risk for inadequate nutrition/hydration  (reduced following general aspiration precautions)    Diet Recommendation   continue a fairly regular/Mech Soft consistency diet w/ well-Cut meats, moistened foods for ease of ordering/Pleasure; Thin liquids -- carefully monitor. Recommend aspiration precautions, Careful Upright positioning w/ ANY oral intake. Support w/ feeding as needed for conservation of energy -- pt to hold Cup when drinking though.  Medication Administration: Whole meds with puree (vs CRUSHED in Puree for ease of Esophageal phase motility)    Other  Recommendations Recommended Consults: Consider GI evaluation;Consider esophageal assessment (Palliative Care; Dietician) Oral Care Recommendations: Oral care BID;Oral care before and after PO;Staff/trained caregiver to provide oral care ([employer] care)    Recommendations for follow up therapy are one component of a multi-disciplinary discharge planning process, led by the attending physician.  Recommendations may be updated based on patient status, additional functional criteria and insurance authorization.  Follow up Recommendations Follow physician's recommendations for discharge plan and follow up therapies      Assistance Recommended at Discharge  full  Functional Status Assessment Patient has had a recent decline in their functional status and/or demonstrates limited ability to make significant improvements in function in a reasonable and predictable amount of time (baseline issues)  Frequency and Duration  (n/a)   (n/a)       Prognosis Prognosis for improved oropharyngeal function: Fair Barriers to Reach Goals: Time post onset;Severity of deficits Barriers/Prognosis Comment: Positioning challenges; deconditioned Status overall      Swallow Study   General Date of Onset: 02/16/23 HPI: Pt is a 77 y.o. female with medical history significant for Parkinson's Dis w/ tremors of nervous system, CHF, type diabetes mellitus, GERD, hypertension, dyslipidemia,  hypothyroidism, Multilevel degenerative disc disease     OSA and osteoarthritis, who presented to the emergency room with acute onset of right hip pain which has been worsening over the last couple months per EMS.  Until 3 days ago the patient was able to use her walker to get around in her house and to go to the bathroom by himself.  She did not have any falls or injuries.  She became unable to walk or move her legs with associated right hip and back pain.  She was seen by her PCP and was given ibuprofen and Percocet which initially helped but is not currently.  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  She appears to be very symptomatic from this.  She has not walked in approximately 4 days.        She has substantial erosive changes in her spine worst between T10 and 12.  She also has severe stenosis in these 2 levels.  She is symptomatic from thoracic stenosis.  Unfortunately, she appears to be in poor health.  Surgical intervention would be a relatively large and involved intervention including decompression and fusion.  CXR: Cardiomegaly and pulmonary vascular congestion with mild  interstitial edema. Type of Study: Bedside Swallow Evaluation Previous Swallow Assessment: MBSS- 01/2022: "presents with a mild pharyngeal dysphagia, suspect similar in function to previous MBS. She does have swallow initiation at  times at the pyriform sinuses with thin liquids, which did result in a single instance of trace, silent aspiration before the swallow with thin liquids via straw. Minimal residue was noted in the valleculae post-swallow, but was often reduced spontaneously. Of note, during esophageal sweep, pt was observed to have barium remaining in the esophagus including the barium pill. Pt reports feeling like things often get stuck or come back up. Question if her risk for aspiration may also be increased post-prandially  from suspected esophageal issues. Would consider a mroe dedicated assessment of the esophagus if there is ongoing concern for aspiration. For now will leave on Dys 3 diet and thin liquids with upright positioning and small, single sips".  Pt has been eating a Regular diet at home per her report. Diet Prior to this Study: Regular;Thin liquids (Level 0) Temperature Spikes Noted: No (wbc 8.3) Respiratory Status: Room air History of Recent Intubation: No Behavior/Cognition: Alert;Cooperative;Pleasant mood (movements; tremors) Oral Cavity Assessment: Within Functional Limits Oral Care Completed by SLP: Recent completion by staff Oral Cavity - Dentition: Dentures, top;Dentures, bottom Vision:  (n/a) Self-Feeding Abilities: Needs assist;Needs set up;Total assist;Able to feed self (can hold Cup to drink) Patient Positioning: Postural control adequate for testing (supported head forward w/ towel roll, pillow positioning) Baseline Vocal Quality: Low vocal intensity (tremorous) Volitional Cough:  (Fair) Volitional Swallow: Able to elicit    Oral/Motor/Sensory Function Overall Oral Motor/Sensory Function: Within functional limits   Ice Chips Ice chips: Not tested   Thin Liquid Thin Liquid: Within functional limits (functional) Presentation: Self Fed;Straw (single sips: 10+) Other Comments: intermittent, slight wet vocal quality b/t trials as at rest during speech -- saliva mixed w/ liquid residue?    Nectar Thick Nectar Thick Liquid: Not tested   Honey Thick Honey Thick Liquid: Not tested   Puree Puree: Not tested Other Comments: pt declined   Solid     Solid: Not tested Other Comments: pt declined stating she ate a "good" breakfast - NSG agreed and denied any swallowing issues during the meal.        Jerilynn Som, MS, CCC-SLP Speech Language Pathologist Rehab Services; [facility] [place] [phone] (ascom) Harvin Konicek 02/18/2023,1:21 PM

## 2023-02-18 NOTE — Evaluation (Signed)
Physical Therapy Evaluation Patient Details Name: Stephanie [name] MRN: [id number] DOB: 1946/03/12 Today's Date: 02/18/2023  History of Present Illness  Pt is a 77 y.o. female with medical history significant for CHF, type diabetes mellitus, GERD, hypertension, dyslipidemia, hypothyroidism, Parkinson's disease, OSA and osteoarthritis, who presented to the emergency room with acute onset of right hip pain, back pain, and inability to walk.  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   Clinical Impression  Pt was pleasant and motivated to participate during the session and put forth good effort throughout.  Pt at baseline is able to amb with Mod Ind with a rollator household distances but this date presented with no notable volitional movement of either LE either in supine or dangling open chain at the EOB.  Pt provided with +2 total assist during one sit to stand trial but pt unable to produce any notable force through her LEs and was returned to sitting.  Pt reported no adverse symptoms during the session other than 4/10 back pain that did not worsen during the session with SpO2 and HR WNL on 3LO2/min.  Pt will benefit from a trial of continued PT services upon discharge to safely address deficits listed in patient problem list for decreased caregiver assistance and eventual return to PLOF.         Recommendations for follow up therapy are one component of a multi-disciplinary discharge planning process, led by the attending physician.   Recommendations may be updated based on patient status, additional functional criteria and insurance authorization.  Follow Up Recommendations Can patient physically be transported by private vehicle: No     Assistance Recommended at Discharge Frequent or constant Supervision/Assistance  Patient can return home with the following  Two people to help with walking and/or transfers;A lot of help with bathing/dressing/bathroom;Assistance with cooking/housework;Assistance with feeding;Direct supervision/assist for medications management;Assist for transportation;Help with stairs or ramp for entrance    Equipment Recommendations None recommended by PT  Recommendations for Other Services       Functional Status Assessment Patient has had a recent decline in their functional status and/or demonstrates limited ability to make significant improvements in function in a reasonable and predictable amount of time     Precautions / Restrictions Precautions Precautions: Fall Restrictions Weight Bearing Restrictions: No      Mobility  Bed Mobility Overal bed mobility: Needs Assistance Bed Mobility: Supine to Sit, Sit to Supine, Rolling Rolling: Max assist, +2 for physical assistance   Supine to sit: Max assist, +2 for physical assistance Sit to supine: Max assist, +2 for physical assistance   General bed mobility comments: Near total assist for BLE and trunk control, cues for sequencing provided for use of bed rails    Transfers Overall transfer level: Needs assistance Equipment used: Rolling walker (2 wheels) Transfers: Sit to/from Stand Sit to Stand: +2 physical assistance, Total assist           General transfer comment: Pt unable to assist with any notable force during one sit to stand transfer with +2 total assist needed to clear the EOB and to remain in standing  Ambulation/Gait               General Gait Details: Unable  Risk analyst Rankin (Stroke Patients Only)       Balance Overall balance assessment: Needs assistance   Sitting balance-Leahy Scale: Poor Sitting balance - Comments: Occasional min A for stability during static sitting at the EOB       Standing balance comment: unable                             Pertinent Vitals/Pain Pain Assessment Pain Assessment: 0-10 Pain Score: 4  Pain Location: back Pain Descriptors / Indicators: Sore Pain Intervention(s): Repositioned, Premedicated before session, Monitored during session    Home Living Family/patient expects to be discharged to:: Private residence Living Arrangements: Children Available Help at Discharge: Family;Available PRN/intermittently Type of Home: House Home Access: Stairs to enter Entrance Stairs-Rails: Right;Left (Too wide to use both) Entrance Stairs-Number of Steps: 2   Home Layout: One level Home Equipment: Rollator (4 wheels);Cane - quad;Shower seat;BSC/3in1;Rolling Environmental consultant (2 wheels) Additional Comments: Lives with oldest daughter who works during the day, 2LO2 at baseline    Prior Function Prior Level of Function : Needs assist             Mobility Comments: Mod Ind amb household distances only with a rollator, uses her rollator seat for longer distances with assist from family, five falls in the last 6 months secondary to BLE buckling ADLs Comments: Daughter assists with ADLs as needed     Hand Dominance   Dominant Hand: Right    Extremity/Trunk Assessment   Upper Extremity Assessment Upper Extremity Assessment: Generalized weakness    Lower Extremity Assessment Lower Extremity Assessment: RLE deficits/detail;LLE deficits/detail RLE Deficits / Details: No volitional movement detected to either LE in supine or open chain sitting at the EOB RLE Sensation: decreased light touch LLE Deficits / Details: No volitional movement detected to either LE in supine or open chain sitting at  the EOB LLE Sensation: decreased light touch       Communication   Communication: Other (comment) (Difficult to understand at times secondary to poor fitting dentures)  Cognition Arousal/Alertness: Awake/alert Behavior During Therapy: WFL for tasks assessed/performed Overall Cognitive Status: Within Functional Limits for tasks assessed                                          General Comments      Exercises Other Exercises Other Exercises: Log roll training including multiple rolls left/right Other Exercises: Static sitting at EOB for improved core strength and static sitting balance   Assessment/Plan    PT Assessment Patient needs continued PT services  PT Problem List Decreased strength;Decreased activity tolerance;Decreased balance;Decreased mobility;Decreased knowledge of use of DME       PT Treatment Interventions DME instruction;Gait training;Functional mobility training;Therapeutic activities;Therapeutic exercise;Balance training;Patient/family education    PT Goals (Current goals can be found in the Care Plan section)  Acute Rehab PT Goals Patient Stated Goal: To walk again PT Goal Formulation: With patient Time For Goal Achievement: 03/03/23 Potential to Achieve Goals: Fair    Frequency Min 2X/week     Co-evaluation               AM-PAC  PT "6 Clicks" Mobility  Outcome Measure Help needed turning from your back to your side while in a flat bed without using bedrails?: Total Help needed moving from lying on your back to sitting on the side of a flat bed without using bedrails?: Total Help needed moving to and from a bed to a chair (including a wheelchair)?: Total Help needed standing up from a chair using your arms (e.g., wheelchair or bedside chair)?: Total Help needed to walk in hospital room?: Total Help needed climbing 3-5 steps with a railing? : Total 6 Click Score: 6    End of Session Equipment Utilized During Treatment: Gait  belt;Oxygen Activity Tolerance: Patient tolerated treatment well Patient left: in bed;with call bell/phone within reach;with bed alarm set Nurse Communication: Mobility status;Other (comment) (MD notified of pt's total lack of volitional movement to her BLEs) PT Visit Diagnosis: History of falling (Z91.81);Difficulty in walking, not elsewhere classified (R26.2);Muscle weakness (generalized) (M62.81)    Time: 1610-9604 PT Time Calculation (min) (ACUTE ONLY): 33 min   Charges:   PT Evaluation $PT Eval Moderate Complexity: 1 Mod PT Treatments $Therapeutic Activity: 8-22 mins      D. Elly Modena PT, DPT 02/18/23, 3:37 PM

## 2023-02-18 NOTE — Consult Note (Signed)
Consultation Note Date: 02/18/2023   Patient Name: Stephanie [name]  DOB: Mar 01, 1946  MRN: [id number]  Age / Sex: 77 y.o., female  PCP: Marguarite Arbour, MD Referring Physician: Charise Killian, MD  Reason for Consultation: Establishing goals of care   HPI/Brief Hospital Course: 77 y.o. female  with past medical history of CHF, T2DM, HTN, HLD, hypothyroidism, OSA, arthritis and Parkinson's disease admitted from home on 02/16/2023 with worsening right hip pain with associated weakness which originally started causing her pain a few months prior. She reports that until 3 days prior to ED arrival she was able to ambulate with assistance of walker. Reports of increased pain and weakness eventually leading to her inability to ambulate even with assistance and inability to move BLE. Noted erythema and wounds to BLE.  Imaging negative for cauda equina syndrome  CT T-spine 1. Multilevel severe degenerative endplate changes in the lower cervical spine, at T1-2, T3-4, and from T9-10 through L1-2. 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 2. Facet arthropathy and gouty tophi contribute to severe spinal canal stenosis and severe bilateral neural foraminal narrowing at T10-11, as well as moderate spinal canal stenosis and severe bilateral neural foraminal narrowing at T11-12. 3. Aortic Atherosclerosis (ICD10-I70.0).  MRI T-spine Erosive spondyloarthropathy, most notable at T10-11 and T11-12 with abnormal paraspinal and epidural soft tissue which is partially calcified and favored to reflect gout as described on CT. Severe spinal stenosis at T10-11 and T11-12 with mild spinal cord signal abnormality/edema.  Palliative medicine was consulted for assisting with goals of care conversations.  Subjective:   Extensive chart review has been completed prior to meeting patient including labs, vital signs, imaging, progress notes, orders, and available advanced directive documents from current and previous encounters.  Introduced myself as a Publishing rights manager as a member of the palliative care team. Explained palliative medicine is specialized medical care for people living with serious illness. It focuses on providing relief from the symptoms and stress of a serious illness. The goal is to improve quality of life for both the patient and the family.   Visited with Stephanie [name] at her bedside. Awake and alert, working with ST on arrival to room. Able to hold cup and sip with straw, emphasis needed on proper positioning. Complaining of back and hip pain-early in our visit Dr. Mayford Knife rounded, adjustments made to pain medication. Assisted with repositioning which provided some pain relief.  Stephanie [name] able to provide a brief life review. She is not currently married, divorced but her ex-husband recently passed away about a month ago. She has 2 daughters-Stephanie [name] and Stephanie [name] and one son-Stephanie [name]. She currently lives with her daughter Stephanie [name], with Stephanie [name]'s husband and son. Has been living with Stephanie [name] for almost 2 years since her last hospital stay for PNA. Confirms above mentioned HPI, a few days prior to admission she was able to independently perform ADL's and ambulate with assistance of a walker, she was also still able to drive short distances. She shares she was diagnosed with Parkinson's disease about 10 years ago. Noted slight dysarthria likely associated with PD.  Stephanie [name] able to recall assessment by neurosurgeon yesterday. Shares her understanding that due to her underlying chronic medical conditions and frailty she is not an appropriate surgical candidate. She is saddened and frustrated by this. Understands focus will be on pain management and possible PT.  Empathetic silence  and emotional  support provided.  We discussed chronic diease trajectory in relation to Parkinson's disease as well as CHF. Emphasis placed on chronic disease, acute exacerbations, self-limiting and symptom burden.  Discussed Code Status-Full Code versus Do Not Resuscitate. Encouraged patient to consider DNR/DNI status understanding evidenced based poor outcomes in similar hospitalized patients, as the cause of the arrest is likely associated with chronic/terminal disease rather than a reversible acute cardio-pulmonary event.    At this time, Stephanie [name] shares she wishes to remain Full Code.  Attempted to discuss further GOC discussions regarding long-term life sustaining interventions. During conversations, Stephanie [name] becomes overwhelmed. She agrees to ongoing GOC conversations and wishes to share this with her daughter-Stephanie [name].  I discussed importance of continued conversations with family/support persons and all members of their medical team regarding overall plan of care and treatment options ensuring decisions are in alignment with patients goals of care.  Attempted to connect with daughter-Stephanie [name]. Left VM, awaiting return call. Will attempt to set up in person meeting through weekend.  All questions/concerns addressed. Emotional support provided to patient/family/support persons. PMT will continue to follow and support patient as needed.  Objective: Primary Diagnoses: Present on Admission:  Unable to ambulate  Acute on chronic HFrEF (heart failure with reduced ejection fraction)  Gout  Parkinson's disease  Type 2 diabetes mellitus with peripheral neuropathy   Physical Exam Constitutional:      General: She is not in acute distress.    Appearance: She is ill-appearing.  Cardiovascular:     Rate and Rhythm: Normal rate.  Pulmonary:     Effort: Pulmonary effort is normal. No respiratory distress.  Skin:    General: Skin is warm and dry.  Neurological:     Mental Status: She is alert.      Motor: Weakness present.     Vital Signs: BP 134/64 (BP Location: Left Arm)   Pulse 68   Temp 97.6 F (36.4 C) (Oral)   Resp 16   Ht 5' (1.524 m)   Wt 61.3 kg   SpO2 100%   BMI 26.39 kg/m  Pain Scale: 0-10   Pain Score: 6   IO: Intake/output summary:  Intake/Output Summary (Last 24 hours) at 02/18/2023 1411 Last data filed at 02/18/2023 0900 Gross per 24 hour  Intake 240 ml  Output 125 ml  Net 115 ml    LBM:   Baseline Weight: Weight: 61.3 kg Most recent weight: Weight: 61.3 kg       Palliative Assessment/Data: 30%   Assessment and Plan  SUMMARY OF RECOMMENDATIONS   Full Code Ongoing GOC discussions needed Attempt to connect with daughter-Stephanie [name] for in person meeting to continue conversations PMT to continue to follow for ongoing needs and support  Discussed With: Primary team and nursing staff   Thank you for this consult and allowing Palliative Medicine to participate in the care of Junius Argyle. Palliative medicine will continue to follow and assist as needed.   Time Total: 75 minutes  Time spent includes: Detailed review of medical records (labs, imaging, vital signs), medically appropriate exam (mental status, respiratory, cardiac, skin), discussed with treatment team, counseling and educating patient, family and staff, documenting clinical information, medication management and coordination of care.   Signed by: Leeanne Deed, DNP, AGNP-C Palliative Medicine    Please contact Palliative Medicine Team phone at [phone] for questions and concerns.  For individual provider: See Loretha Stapler

## 2023-02-18 NOTE — Progress Notes (Addendum)
PROGRESS NOTE    Stephanie [name]  VWU:[id number] DOB: 1946-09-25 DOA: 02/16/2023 PCP: Marguarite Arbour, MD   Assessment & Plan:   Principal Problem:   Unable to ambulate Active Problems:   Acute on chronic HFrEF (heart failure with reduced ejection fraction)   Parkinson's disease   Hypertensive urgency   Type 2 diabetes mellitus with peripheral neuropathy   Hypokalemia   Gout   GERD without esophagitis   Thoracic spondylosis with myelopathy   Spinal stenosis, thoracic  Assessment and Plan:  Unable to ambulate: likely secondary thoracic stenosis & thoracic myelopathy. Poor surgical candidate as per neuro surg.  PT/OT consulted.   Cellulitis: of b/l LE. Continue on IV ancef. Also possible venous stasis    Acute on chronic systolic CHF: continue on IV lasix. Monitor I/Os  Hypokalemia: potassium given    Hypertensive urgency: urgency resolved but still w/ HTN. Continue on metoprolol, losartan    Parkinson's disease: continue on home dose of sinemet, entacapone, amantadine, pramipexole. Palliative care following and recs apprec    Hypokalemia: potassium given   DM2: likely poorly controlled. Continue on SSI w/ accuchecks  Peripheral neuropathy: continue on home dose of gabapentin    GERD: continue on PPI   Gout: continue on home dose of allopurinol       DVT prophylaxis: lovenox  Code Status: full  Family Communication:  Disposition Plan: depends on PT/OT recs   Level of care: Med-Surg  Status is: Inpatient Remains inpatient appropriate because: severity of illness    Consultants:  Neuro surg   Procedures:   Antimicrobials: ancef  Subjective: Pt c/o back pain   Objective: Vitals:   02/17/23 1444 02/17/23 2339 02/18/23 0052 02/18/23 0747  BP: 109/66 (!) 187/67 (!) 166/51 (!) 165/81  Pulse: (!) 55 (!) 57 (!) 52 (!) 56  Resp: 16 16    Temp: 98.2 F (36.8 C) (!) 97.5 F (36.4 C)  97.7 F (36.5 C)  TempSrc:    Oral  SpO2: 96% 100% 100% 100%   Weight:      Height:        Intake/Output Summary (Last 24 hours) at 02/18/2023 0831 Last data filed at 02/17/2023 1324 Gross per 24 hour  Intake 400 ml  Output --  Net 400 ml   Filed Weights   02/17/23 0626  Weight: 61.3 kg    Examination:  General exam: Appears calm and comfortable  Respiratory system: Clear to auscultation. Respiratory effort normal. Cardiovascular system: S1 & S2 +. No rubs, gallops or clicks.  Gastrointestinal system: Abdomen is nondistended, soft and nontender.  Normal bowel sounds heard. Central nervous system: Alert and awake.  Psychiatry: Judgement and insight appears at baseline. Flat mood and affect     Data Reviewed: I have personally reviewed following labs and imaging studies  CBC: Recent Labs  Lab 02/16/23 1850 02/17/23 0440  WBC 7.5 8.3  NEUTROABS 6.7  --   HGB 12.2 12.5  HCT 37.1 38.6  MCV 95.1 93.9  PLT 294 302   Basic Metabolic Panel: Recent Labs  Lab 02/16/23 1850 02/17/23 0440 02/17/23 1540  NA 139 140 141  K 2.8* 2.8* 3.1*  CL 101 103 101  CO2 27 28 31   GLUCOSE 104* 92 194*  BUN 45* 34* 28*  CREATININE 1.03* 0.68 0.73  CALCIUM 8.4* 8.5* 8.4*  MG  --  2.1  --    GFR: Estimated Creatinine Clearance: 48.9 mL/min (by C-G formula based on SCr of 0.73 mg/dL). Liver  Function Tests: Recent Labs  Lab 02/16/23 1850  AST 23  ALT <5  ALKPHOS 91  BILITOT 1.1  PROT 6.4*  ALBUMIN 3.3*   No results for input(s): "LIPASE", "AMYLASE" in the last 168 hours. No results for input(s): "AMMONIA" in the last 168 hours. Coagulation Profile: No results for input(s): "INR", "PROTIME" in the last 168 hours. Cardiac Enzymes: No results for input(s): "CKTOTAL", "CKMB", "CKMBINDEX", "TROPONINI" in the last 168 hours. BNP (last 3 results) No results for input(s): "PROBNP" in the last 8760 hours. HbA1C: Recent Labs    02/17/23 0440  HGBA1C 6.1*   CBG: Recent Labs  Lab 02/17/23 0824 02/17/23 1237 02/17/23 1658  02/17/23 2158 02/18/23 0755  GLUCAP 74 92 183* 150* 123*   Lipid Profile: No results for input(s): "CHOL", "HDL", "LDLCALC", "TRIG", "CHOLHDL", "LDLDIRECT" in the last 72 hours. Thyroid Function Tests: No results for input(s): "TSH", "T4TOTAL", "FREET4", "T3FREE", "THYROIDAB" in the last 72 hours. Anemia Panel: No results for input(s): "VITAMINB12", "FOLATE", "FERRITIN", "TIBC", "IRON", "RETICCTPCT" in the last 72 hours. Sepsis Labs: No results for input(s): "PROCALCITON", "LATICACIDVEN" in the last 168 hours.  No results found for this or any previous visit (from the past 240 hour(s)).       Radiology Studies: MR THORACIC SPINE WO CONTRAST  Result Date: 02/17/2023 CLINICAL DATA:  Ataxia, nontraumatic, thoracic pathology suspected. EXAM: MRI THORACIC SPINE WITHOUT CONTRAST TECHNIQUE: Multiplanar, multisequence MR imaging of the thoracic spine was performed. No intravenous contrast was administered. COMPARISON:  Thoracic spine CT 02/17/2023. Lumbar spine MRI 02/17/2023. FINDINGS: Alignment: Minimal anterolisthesis of T11 on T12. Grade 1 retrolisthesis of L1 on L2, L2 on L3, and L3 on L4. Vertebrae: Fluid signal in the disc spaces at T10-11 and T11-12 with associated endplate erosions as shown on today's earlier CT which also demonstrated gas in the T10-11 disc space. Prominent vertebral sclerosis at these levels on CT with only mild marrow edema. Erosive facet arthropathy at these levels as noted on CT with associated posterior element edema, greatest at T10-11 on the right. Additional advanced disc space narrowing with vertebral sclerosis, mild endplate edema, and prominent endplate irregularity at T2-3, T12-L1, and L1-2. Anterior wedging of the T11 vertebral body related to erosions. Cord: Suspected mild T2 hyperintensity in the spinal cord extending from T9-10 to T11-12 associated with severe spinal stenosis. Paraspinal and other soft tissues: Bilateral paraspinal soft tissue edema from  T8-T12. Associated soft tissue calcifications as shown on CT, particularly notable posterolaterally on the right at T10-11. Abnormal epidural material in the spinal canal bilaterally at T10-11 and T11-12 which is also calcified by CT. Disc levels: The above described changes involving the discs and facets as well as epidural material at T10-11 and T11-12 result in severe spinal stenosis with moderate to severe cord flattening as well as severe neural foraminal stenosis. Mild spinal stenosis at T12-L1. Moderate bilateral neural foraminal stenosis at T2-3. IMPRESSION: Erosive spondyloarthropathy, most notable at T10-11 and T11-12 with abnormal paraspinal and epidural soft tissue which is partially calcified and favored to reflect gout as described on CT. Severe spinal stenosis at T10-11 and T11-12 with mild spinal cord signal abnormality/edema. Electronically Signed   By: Sebastian Ache M.D.   On: 02/17/2023 14:05   CT THORACIC SPINE WO CONTRAST  Result Date: 02/17/2023 CLINICAL DATA:  Myelopathy, acute, thoracic spine. EXAM: CT THORACIC SPINE WITHOUT CONTRAST TECHNIQUE: Multidetector CT images of the thoracic were obtained using the standard protocol without intravenous contrast. RADIATION DOSE REDUCTION: This exam was performed  according to the departmental dose-optimization program which includes automated exposure control, adjustment of the mA and/or kV according to patient size and/or use of iterative reconstruction technique. COMPARISON:  MRI lumbar spine 02/17/2023. FINDINGS: Alignment: Grade 1 anterolisthesis of T11 on T12. Vertebrae: Multilevel severe degenerative endplate changes in the lower cervical spine, at T1-2, T3-4, and from T9-10 through L1-2. 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 Paraspinal and other soft tissues: Atherosclerotic  calcifications of the aorta and its branches. Linear atelectasis and/or scarring in the lung bases. Disc levels: Facet arthropathy and gouty tophi contribute to severe spinal canal stenosis and severe bilateral neural foraminal narrowing at T10-11, as well as moderate spinal canal stenosis and severe bilateral neural foraminal narrowing at T11-12. IMPRESSION: 1. Multilevel severe degenerative endplate changes in the lower cervical spine, at T1-2, T3-4, and from T9-10 through L1-2. 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 2. Facet arthropathy and gouty tophi contribute to severe spinal canal stenosis and severe bilateral neural foraminal narrowing at T10-11, as well as moderate spinal canal stenosis and severe bilateral neural foraminal narrowing at T11-12. 3. Aortic Atherosclerosis (ICD10-I70.0). Electronically Signed   By: Orvan Falconer M.D.   On: 02/17/2023 10:00   MR LUMBAR SPINE WO CONTRAST  Result Date: 02/17/2023 CLINICAL DATA:  Low back pain EXAM: MRI LUMBAR SPINE WITHOUT CONTRAST TECHNIQUE: Multiplanar, multisequence MR imaging of the lumbar spine was performed. No intravenous contrast was administered. COMPARISON:  01/27/2016 FINDINGS: Segmentation:  Standard. Alignment: Grade 1 anterolisthesis at T11-12. Grade 1 retrolisthesis at L2-3 and L3-4. Vertebrae: Compression deformity of T11 with superior osteonecrotic cleft. No acute fracture. Conus medullaris and cauda equina: Conus extends to the L2 level. Conus and cauda equina appear normal. Paraspinal and other soft tissues: Negative Disc levels: Axial images are markedly motion degraded. T9-10: Unremarkable. T10-11: Moderate facet hypertrophy with left facet spurring that encroaches on the left dorsal thecal sac. Endplate ridging. Moderate spinal canal stenosis. Severe bilateral foraminal stenosis.  T11-12: Disc bulge with superimposed right subarticular protrusion. Mild spinal canal stenosis. Severe bilateral foraminal stenosis. T12-L1: Small central disc protrusion without spinal canal stenosis. L1-L2: Disc space narrowing with small bulge and endplate ridging. No spinal canal stenosis. No neural foraminal stenosis. L2-L3: Small disc bulge with endplate ridging. No spinal canal stenosis. No neural foraminal stenosis. L3-L4: Small disc bulge with mild facet hypertrophy and endplate ridging. No spinal canal stenosis. Moderate bilateral neural foraminal stenosis. L4-L5: Medium-sized disc bulge with moderate facet hypertrophy. Mild spinal canal stenosis. Severe bilateral neural foraminal stenosis. L5-S1: Small disc bulge. No spinal canal stenosis. Moderate bilateral neural foraminal stenosis. Visualized sacrum: Normal. IMPRESSION: 1. Motion degraded examination. Progression of multilevel degenerative disc disease since 01/27/16. 2. Subacute wedge compression fracture of T11. 3. Moderate T10-11 and mild T11-12 spinal canal stenosis with severe bilateral neural foraminal stenosis at both levels. 4. Mild L4-5 spinal canal stenosis and severe bilateral neural foraminal stenosis. 5. Moderate bilateral L3-4 and L5-S1 neural foraminal stenosis. Electronically Signed   By: Deatra Robinson M.D.   On: 02/17/2023 00:56   CT Hip Right Wo Contrast  Result Date: 02/16/2023 CLINICAL DATA:  Hip pain, stress fracture suspected, neg xray Right hip pain for 2 months. EXAM: CT OF THE RIGHT HIP WITHOUT CONTRAST TECHNIQUE: Multidetector  CT imaging of the right hip was performed according to the standard protocol. Multiplanar CT image reconstructions were also generated. RADIATION DOSE REDUCTION: This exam was performed according to the departmental dose-optimization program which includes automated exposure control, adjustment of the mA and/or kV according to patient size and/or use of iterative reconstruction technique. COMPARISON:   Radiograph earlier today. Included portions from abdominopelvic CT 12/16/2022 FINDINGS: Bones/Joint/Cartilage No fracture. No periostitis to suggest stress reaction on CT. Mild hip degenerative change with acetabular spurring and subchondral cyst. No evidence of a vascular necrosis, erosion or focal bone lesions. No bony destructive change. There may be minimal hip joint effusion. Ligaments Suboptimally assessed by CT. Muscles and Tendons Slightly atrophic but no focal abnormality. Soft tissues There is subcutaneous soft tissue edema. Marked distention of the urinary bladder is partially included. Large volume of stool in the included colon. IMPRESSION: 1. Mild hip degenerative change. No acute findings. 2. Marked distention of the urinary bladder is partially included. Large volume of stool in the included colon. 3. Generalized subcutaneous edema, nonspecific. Electronically Signed   By: Narda Rutherford M.D.   On: 02/16/2023 21:56   DG Chest 1 View  Result Date: 02/16/2023 CLINICAL DATA:  Weakness history of CHF EXAM: CHEST  1 VIEW COMPARISON:  10/08/2022 FINDINGS: Stable cardiomegaly. Aortic atherosclerotic calcification. Pulmonary vascular congestion and diffuse bilateral interstitial coarsening. Linear opacities in the left mid lung may be due to atelectasis or scarring. No pleural effusion or pneumothorax. No displaced rib fractures. IMPRESSION: 1. Cardiomegaly and pulmonary vascular congestion with mild interstitial edema. Electronically Signed   By: Minerva Fester M.D.   On: 02/16/2023 19:55   DG Hand Complete Left  Result Date: 02/16/2023 CLINICAL DATA:  Pain after trauma. EXAM: LEFT HAND - COMPLETE 3 VIEW COMPARISON:  None Available. FINDINGS: No obvious fracture or dislocation. Preserved joint spaces and bone mineralization. Minimal osteophytes along the interphalangeal joint of the thumb. Evaluation limited as the fingers are flexed on all views in the overlapping on the lateral view.  IMPRESSION: Mild degenerative changes. No acute osseous abnormality. Limited views. Electronically Signed   By: Karen Kays M.D.   On: 02/16/2023 18:31   DG Hip Unilat W or Wo Pelvis 2-3 Views Right  Result Date: 02/16/2023 CLINICAL DATA:  Right-sided hip pain for 2 months EXAM: DG HIP (WITH OR WITHOUT PELVIS) 3V RIGHT COMPARISON:  None Available. FINDINGS: Osteopenia. No fracture or dislocation. Slight joint space loss of the right hip. Mild sclerosis along the sacroiliac joints. Degenerative changes seen of the lumbar spine at the edge of the imaging field. There are some rounded densities in the pelvis which are indeterminate although possibly vascular. Note is made of diffuse colonic stool. The areas of bony sclerosis seen on the patient's prior CT scan of the abdomen and pelvis of 12/16/2022 not as well appreciated by this x-ray. Please correlate with specific history and prior workup IMPRESSION: Osteopenia with mild degenerative changes. Significant colonic stool Electronically Signed   By: Karen Kays M.D.   On: 02/16/2023 18:29        Scheduled Meds:  acetaminophen  1,000 mg Oral Once   allopurinol  100 mg Oral Daily   aspirin EC  81 mg Oral Daily   busPIRone  10 mg Oral BID   carbidopa-levodopa  1 tablet Oral QHS   carbidopa-levodopa  2 tablet Oral TID WC & HS   Chlorhexidine Gluconate Cloth  6 each Topical Daily   diclofenac Sodium  4 g Topical  QID   enoxaparin (LOVENOX) injection  40 mg Subcutaneous Q24H   entacapone  100 mg Oral TID   furosemide  40 mg Intravenous Daily   gabapentin  300 mg Oral QHS   insulin aspart  0-5 Units Subcutaneous QHS   insulin aspart  0-9 Units Subcutaneous TID WC   levothyroxine  75 mcg Oral Q0600   LORazepam  0.5 mg Intravenous Once   losartan  100 mg Oral Daily   multivitamin with minerals  1 tablet Oral Daily   pantoprazole  40 mg Oral Daily   polyethylene glycol  17 g Oral Daily   potassium chloride  40 mEq Oral BID   pramipexole  0.125 mg  Oral TID AC & HS   pravastatin  40 mg Oral QHS   QUEtiapine  100 mg Oral QHS   sertraline  25 mg Oral Daily   Continuous Infusions:   ceFAZolin (ANCEF) IV 1 g (02/18/23 0802)     LOS: 1 day    Time spent: 35 mins    Charise Killian, MD [facility] Pager [phone]  If 7PM-7AM, please contact night-coverage [email or web address] 02/18/2023, 8:31 AM

## 2023-02-18 NOTE — Plan of Care (Signed)
  Problem: Skin Integrity: Goal: Risk for impaired skin integrity will decrease Outcome: Progressing   Problem: Tissue Perfusion: Goal: Adequacy of tissue perfusion will improve Outcome: Progressing   Problem: Elimination: Goal: Will not experience complications related to bowel motility Outcome: Progressing Goal: Will not experience complications related to urinary retention Outcome: Progressing   Problem: Pain Managment: Goal: General experience of comfort will improve Outcome: Progressing   Problem: Safety: Goal: Ability to remain free from injury will improve Outcome: Progressing

## 2023-02-18 NOTE — Progress Notes (Signed)
PT Cancellation Note  Patient Details Name: Stephanie [name] MRN: [id number] DOB: 11-24-1945   Cancelled Treatment:    Reason Eval/Treat Not Completed: Patient declined to participate with PT services this AM secondary to back pain.  Pt stated that she had recently received pain mediation and requested to attempt to participate with PT in the afternoon.  Will attempt to see pt at a future date/time as medically appropriate.    Ovidio Hanger PT, DPT 02/18/23, 11:59 AM

## 2023-02-18 NOTE — Progress Notes (Signed)
  Chaplain On-Call responded to Spiritual Care Consult Order from Dr. Fabienne Bruns for prayer with the patient.  Chaplain met the patient and her niece Stephanie [name].  Chaplain offered compassionate listening as the patient and Stephanie [name] spoke of their strong faith, and as they described the patient's current health situation. The patient has Parkinson's Disease, and recently fell and fractured a hip. She stated that she is not a candidate for surgery, and likely will require an Assisted Living facility.  Chaplain provided spiritual and emotional support and prayer.  Chaplain Evelena Peat M.Div., [facility]

## 2023-02-18 NOTE — Plan of Care (Signed)

## 2023-02-19 ENCOUNTER — Inpatient Hospital Stay: Payer: Medicare Other

## 2023-02-19 DIAGNOSIS — L03119 Cellulitis of unspecified part of limb: Secondary | ICD-10-CM | POA: Diagnosis not present

## 2023-02-19 DIAGNOSIS — G20B2 Parkinson's disease with dyskinesia, with fluctuations: Secondary | ICD-10-CM | POA: Diagnosis not present

## 2023-02-19 DIAGNOSIS — R262 Difficulty in walking, not elsewhere classified: Secondary | ICD-10-CM | POA: Diagnosis not present

## 2023-02-19 LAB — BASIC METABOLIC PANEL
Anion gap: 7 (ref 5–15)
BUN: 21 mg/dL (ref 8–23)
CO2: 32 mmol/L (ref 22–32)
Calcium: 8.8 mg/dL — ABNORMAL LOW (ref 8.9–10.3)
Chloride: 101 mmol/L (ref 98–111)
Creatinine, Ser: 0.58 mg/dL (ref 0.44–1.00)
GFR, Estimated: >60 mL/min (ref >60)
Glucose, Bld: 114 mg/dL — ABNORMAL HIGH (ref 70–99)
Potassium: 4.2 mmol/L (ref 3.5–5.1)
Sodium: 140 mmol/L (ref 135–145)

## 2023-02-19 LAB — GLUCOSE, CAPILLARY
Glucose-Capillary: 98 mg/dL (ref 70–99)
Glucose-Capillary: 171 mg/dL — ABNORMAL HIGH (ref 70–99)
Glucose-Capillary: 236 mg/dL — ABNORMAL HIGH (ref 70–99)
Glucose-Capillary: 145 mg/dL — ABNORMAL HIGH (ref 70–99)

## 2023-02-19 LAB — CBC
HCT: 36.3 % (ref 36.0–46.0)
Hemoglobin: 11.6 g/dL — ABNORMAL LOW (ref 12.0–15.0)
MCH: 30.9 pg (ref 26.0–34.0)
MCHC: 32 g/dL (ref 30.0–36.0)
MCV: 96.8 fL (ref 80.0–100.0)
Platelets: 248 10*3/uL (ref 150–400)
RBC: 3.75 MIL/uL — ABNORMAL LOW (ref 3.87–5.11)
RDW: 15.6 % — ABNORMAL HIGH (ref 11.5–15.5)
WBC: 12.4 10*3/uL — ABNORMAL HIGH (ref 4.0–10.5)
nRBC: 0 % (ref 0.0–0.2)

## 2023-02-19 MED ORDER — POTASSIUM CHLORIDE CRYS ER 20 MEQ PO TBCR: 40.0000 meq | EXTENDED_RELEASE_TABLET | Freq: Once | ORAL | Status: DC

## 2023-02-19 MED ORDER — SODIUM CHLORIDE 0.9 % IV SOLN: INTRAVENOUS | Status: DC | PRN

## 2023-02-19 NOTE — Progress Notes (Signed)
PROGRESS NOTE    Stephanie [name]  ZOX:[id number] DOB: 08/10/1946 DOA: 02/16/2023 PCP: Marguarite Arbour, MD   Assessment & Plan:   Principal Problem:   Unable to ambulate Active Problems:   Acute on chronic HFrEF (heart failure with reduced ejection fraction)   Parkinson's disease   Hypertensive urgency   Type 2 diabetes mellitus with peripheral neuropathy   Hypokalemia   Gout   GERD without esophagitis   Thoracic spondylosis with myelopathy   Spinal stenosis, thoracic  Assessment and Plan:  Unable to ambulate: likely secondary thoracic stenosis & thoracic myelopathy. Poor surgical candidate as per neuro surg.  PT/OT recs SNF   Cellulitis: of b/l LE. Continue on IV ancef. W/ possible venous stasis    Acute on chronic systolic CHF: continue on IV lasix. Monitor I/Ios. Desaturation into the 80s today. Repeat CXR ordered.    Hypertensive urgency: urgency resolved but still w/ HTN. Continue on losartan, metoprolol     Parkinson's disease: continue on home dose of sinemet, entacapone, amantadine, pramipexole. Palliative care following and recs apprec    Hypokalemia: WNL today    DM2: well controlled, HbA1c 6.1. Continue on SSI w/ accuchecks   Peripheral neuropathy: continue on home dose of gabapentin    GERD: continue on PPI    Gout: continue on home dose of allopurinol       DVT prophylaxis: lovenox  Code Status: full  Family Communication: called pt's daughter, [name], no answer but discussed pt's care w/ pt's other daughter and answered her questions  Disposition Plan: depends on PT/OT recs   Level of care: Med-Surg  Status is: Inpatient Remains inpatient appropriate because: severity of illness    Consultants:  Neuro surg   Procedures:   Antimicrobials: ancef  Subjective: Pt c/o weakness   Objective: Vitals:   02/18/23 0747 02/18/23 1345 02/18/23 1548 02/19/23 0007  BP: (!) 165/81 134/64 (!) 151/53 (!) 163/76  Pulse: (!) 56 68 61 (!) 59   Resp:   16 18  Temp: 97.7 F (36.5 C) 97.6 F (36.4 C)  97.6 F (36.4 C)  TempSrc: Oral Oral    SpO2: 100% 100% 100% 100%  Weight:      Height:        Intake/Output Summary (Last 24 hours) at 02/19/2023 0818 Last data filed at 02/19/2023 0615 Gross per 24 hour  Intake 720 ml  Output 2200 ml  Net -1480 ml   Filed Weights   02/17/23 0626  Weight: 61.3 kg    Examination:  General exam: Appears calm but uncomfortable  Respiratory system: clear breath sounds b/l.  Cardiovascular system: S1/S2+. No rubs or clicks  Gastrointestinal system: Abd is soft, NT, ND & hypoactive bowel sounds  Central nervous system: alert and awake.  Psychiatry: judgement and insight appears at baseline. Flat mood and affect      Data Reviewed: I have personally reviewed following labs and imaging studies  CBC: Recent Labs  Lab 02/16/23 1850 02/17/23 0440  WBC 7.5 8.3  NEUTROABS 6.7  --   HGB 12.2 12.5  HCT 37.1 38.6  MCV 95.1 93.9  PLT 294 302   Basic Metabolic Panel: Recent Labs  Lab 02/16/23 1850 02/17/23 0440 02/17/23 1540  NA 139 140 141  K 2.8* 2.8* 3.1*  CL 101 103 101  CO2 GLUCOSE 104* 92 194*  BUN 45* 34* 28*  CREATININE 1.03* 0.68 0.73  CALCIUM 8.4* 8.5* 8.4*  MG  --  2.1  --  GFR: Estimated Creatinine Clearance: 48.9 mL/min (by C-G formula based on SCr of 0.73 mg/dL). Liver Function Tests: Recent Labs  Lab 02/16/23 1850  AST 23  ALT <5  ALKPHOS 91  BILITOT 1.1  PROT 6.4*  ALBUMIN 3.3*   No results for input(s): "LIPASE", "AMYLASE" in the last 168 hours. No results for input(s): "AMMONIA" in the last 168 hours. Coagulation Profile: No results for input(s): "INR", "PROTIME" in the last 168 hours. Cardiac Enzymes: No results for input(s): "CKTOTAL", "CKMB", "CKMBINDEX", "TROPONINI" in the last 168 hours. BNP (last 3 results) No results for input(s): "PROBNP" in the last 8760 hours. HbA1C: Recent Labs    02/17/23 0440  HGBA1C 6.1*    CBG: Recent Labs  Lab 02/18/23 0755 02/18/23 1132 02/18/23 1743 02/18/23 2125 02/19/23 0741  GLUCAP 123* 192* 162* 134* 98   Lipid Profile: No results for input(s): "CHOL", "HDL", "LDLCALC", "TRIG", "CHOLHDL", "LDLDIRECT" in the last 72 hours. Thyroid Function Tests: No results for input(s): "TSH", "T4TOTAL", "FREET4", "T3FREE", "THYROIDAB" in the last 72 hours. Anemia Panel: No results for input(s): "VITAMINB12", "FOLATE", "FERRITIN", "TIBC", "IRON", "RETICCTPCT" in the last 72 hours. Sepsis Labs: No results for input(s): "PROCALCITON", "LATICACIDVEN" in the last 168 hours.  No results found for this or any previous visit (from the past 240 hour(s)).       Radiology Studies: MR THORACIC SPINE WO CONTRAST  Result Date: 02/17/2023 CLINICAL DATA:  Ataxia, nontraumatic, thoracic pathology suspected. EXAM: MRI THORACIC SPINE WITHOUT CONTRAST TECHNIQUE: Multiplanar, multisequence MR imaging of the thoracic spine was performed. No intravenous contrast was administered. COMPARISON:  Thoracic spine CT 02/17/2023. Lumbar spine MRI 02/17/2023. FINDINGS: Alignment: Minimal anterolisthesis of T11 on T12. Grade 1 retrolisthesis of L1 on L2, L2 on L3, and L3 on L4. Vertebrae: Fluid signal in the disc spaces at T10-11 and T11-12 with associated endplate erosions as shown on today's earlier CT which also demonstrated gas in the T10-11 disc space. Prominent vertebral sclerosis at these levels on CT with only mild marrow edema. Erosive facet arthropathy at these levels as noted on CT with associated posterior element edema, greatest at T10-11 on the right. Additional advanced disc space narrowing with vertebral sclerosis, mild endplate edema, and prominent endplate irregularity at T2-3, T12-L1, and L1-2. Anterior wedging of the T11 vertebral body related to erosions. Cord: Suspected mild T2 hyperintensity in the spinal cord extending from T9-10 to T11-12 associated with severe spinal stenosis.  Paraspinal and other soft tissues: Bilateral paraspinal soft tissue edema from T8-T12. Associated soft tissue calcifications as shown on CT, particularly notable posterolaterally on the right at T10-11. Abnormal epidural material in the spinal canal bilaterally at T10-11 and T11-12 which is also calcified by CT. Disc levels: The above described changes involving the discs and facets as well as epidural material at T10-11 and T11-12 result in severe spinal stenosis with moderate to severe cord flattening as well as severe neural foraminal stenosis. Mild spinal stenosis at T12-L1. Moderate bilateral neural foraminal stenosis at T2-3. IMPRESSION: Erosive spondyloarthropathy, most notable at T10-11 and T11-12 with abnormal paraspinal and epidural soft tissue which is partially calcified and favored to reflect gout as described on CT. Severe spinal stenosis at T10-11 and T11-12 with mild spinal cord signal abnormality/edema. Electronically Signed   By: Sebastian Ache M.D.   On: 02/17/2023 14:05   CT THORACIC SPINE WO CONTRAST  Result Date: 02/17/2023 CLINICAL DATA:  Myelopathy, acute, thoracic spine. EXAM: CT THORACIC SPINE WITHOUT CONTRAST TECHNIQUE: Multidetector CT images of the thoracic  were obtained using the standard protocol without intravenous contrast. RADIATION DOSE REDUCTION: This exam was performed according to the departmental dose-optimization program which includes automated exposure control, adjustment of the mA and/or kV according to patient size and/or use of iterative reconstruction technique. COMPARISON:  MRI lumbar spine 02/17/2023. FINDINGS: Alignment: Grade 1 anterolisthesis of T11 on T12. Vertebrae: Multilevel severe degenerative endplate changes in the lower cervical spine, at T1-2, T3-4, and from T9-10 through L1-2. Erosive changes of the endplates and erosive facet arthropathy, greatest at T10-11 and T11-12, with surrounding areas of ovoid high density, favored to reflect gouty  spondyloarthropathy and tophi. Paraspinal and other soft tissues: Atherosclerotic calcifications of the aorta and its branches. Linear atelectasis and/or scarring in the lung bases. Disc levels: Facet arthropathy and gouty tophi contribute to severe spinal canal stenosis and severe bilateral neural foraminal narrowing at T10-11, as well as moderate spinal canal stenosis and severe bilateral neural foraminal narrowing at T11-12. IMPRESSION: 1. Multilevel severe degenerative endplate changes in the lower cervical spine, at T1-2, T3-4, and from T9-10 through L1-2. 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 2. Facet arthropathy and gouty tophi contribute to severe spinal canal stenosis and severe bilateral neural foraminal narrowing at T10-11, as well as moderate spinal canal stenosis and severe bilateral neural foraminal narrowing at T11-12. 3. Aortic Atherosclerosis (ICD10-I70.0). Electronically Signed   By: Orvan Falconer M.D.   On: 02/17/2023 10:00        Scheduled Meds:  acetaminophen  1,000 mg Oral Once   allopurinol  100 mg Oral Daily   amantadine  100 mg Oral BID   aspirin EC  81 mg Oral Daily   busPIRone  10 mg Oral BID   carbidopa-levodopa  1 tablet Oral QHS   carbidopa-levodopa  2 tablet Oral TID WC & HS   Chlorhexidine Gluconate Cloth  6 each Topical Daily   diclofenac Sodium  4 g Topical QID   enoxaparin (LOVENOX) injection  40 mg Subcutaneous Q24H   entacapone  100 mg Oral TID   furosemide  40 mg Intravenous Daily   gabapentin  300 mg Oral QHS   insulin aspart  0-5 Units Subcutaneous QHS   insulin aspart  0-9 Units Subcutaneous TID WC   levothyroxine  75 mcg Oral Q0600   LORazepam  0.5 mg Intravenous Once   losartan  100 mg Oral Daily   multivitamin with minerals  1 tablet Oral Daily   pantoprazole  40 mg Oral Daily    polyethylene glycol  17 g Oral Daily   pramipexole  0.125 mg Oral TID AC & HS   pravastatin  40 mg Oral QHS   QUEtiapine  100 mg Oral QHS   sertraline  25 mg Oral Daily   Continuous Infusions:   ceFAZolin (ANCEF) IV 1 g (02/18/23 2330)     LOS: 2 days    Time spent: 35 mins    Charise Killian, MD [facility] Pager [phone]  If 7PM-7AM, please contact night-coverage [email or web address] 02/19/2023, 8:18 AM

## 2023-02-19 NOTE — Plan of Care (Signed)
  Problem: Coping: Goal: Ability to adjust to condition or change in health will improve Outcome: Progressing   Problem: Metabolic: Goal: Ability to maintain appropriate glucose levels will improve Outcome: Progressing   Problem: Coping: Goal: Level of anxiety will decrease Outcome: Progressing

## 2023-02-19 NOTE — Progress Notes (Signed)
Daily Progress Note   Patient Name: Stephanie [name]       Date: 02/19/2023 DOB: December 25, 1945  Age: 77 y.o. MRN#: [id number] Attending Physician: Charise Killian, MD Primary Care Physician: Marguarite Arbour, MD Admit Date: 02/16/2023  Reason for Consultation/Follow-up: Establishing goals of care  HPI/Brief Hospital Review: 77 y.o. female  with past medical history of CHF, T2DM, HTN, HLD, hypothyroidism, OSA, arthritis and Parkinson's disease admitted from home on 02/16/2023 with worsening right hip pain with associated weakness which originally started causing her pain a few months prior. She reports that until 3 days prior to ED arrival she was able to ambulate with assistance of walker. Reports of increased pain and weakness eventually leading to her inability to ambulate even with assistance and inability to move BLE. Noted erythema and wounds to BLE.   Imaging negative for cauda equina syndrome   CT T-spine 1. Multilevel severe degenerative endplate changes in the lower cervical spine, at T1-2, T3-4, and from T9-10 through L1-2. 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 2. Facet arthropathy and gouty tophi contribute to severe spinal canal stenosis and severe bilateral neural foraminal narrowing at T10-11, as well as moderate spinal canal stenosis and severe bilateral neural foraminal narrowing at T11-12. 3. Aortic Atherosclerosis (ICD10-I70.0).   MRI T-spine Erosive spondyloarthropathy, most notable at T10-11 and T11-12 with abnormal paraspinal and epidural soft tissue which is partially calcified and favored to reflect gout  as described on CT. Severe spinal stenosis at T10-11 and T11-12 with mild spinal cord signal abnormality/edema.   Palliative medicine was consulted for assisting with goals of care conversations.  Subjective: Extensive chart review has been completed prior to meeting patient including labs, vital signs, imaging, progress notes, orders, and available advanced directive documents from current and previous encounters.    Visited with Ms. Kuhlmann at her bedside earlier in AM. Awake and alert, reports an uneventful evening. Daughter-Charlotte at bedside during time of visit. Nursing staff also at bedside during time of visit obtaining vitals. Initially found to be quite hypotensive (SBP 70's) but on recheck SBP 110's-nursing staff to hold AM antihypertensive medications. SpO2 initially found to be 80's%, nursing staff increased supplement O2, I coached  patient on deep breathing exercises and use of spirometry-with these interventions SpO2 increased and sustaining low-mid 90's. Dr. Mayford Knife in room to assess and provider further recommendations. Obtaining repeat CXR.  Explained readings and interventions to daughter Claris Gower. Expresses concern about her mother's overall condition. Answered and addressed all questions and concerns. It was requested I return later to visit once her daughter-Donna-primary caretaker is available to visit.  Later visited again with both daughters-Charlotte and [name] present as well as niece-Della. Reviewed current medical condition including recommendations made by neurosurgeon. Daughter-Donna expressed concern regarding her agreement with providers recommendations. She shares she feels Ms. Laduke will be able to regain strength and improved condition with time and recovery with hopeful possibility to be able to undergo surgery at some point in the future. Reviewed MRI findings and recommendations for pain management as well as therapy.  [name] also concerned about  discharge planning. Answered and addressed many questions and concerns surrounding discharge planning. Explained difference between SNF versus LTC versus ALF. Process involving therapy and provider recommendations as well as insurance eligibility and criteria. Assured [name] Ms. Charpentier will be assigned a team member from [facility] who will assist in this process and answer ongoing questions and concerns. [name] shares that she wishes for Ms. Granville to go to [facility] if possible as she has family connections and Ms. Cornia's sister is a LTC resident at that facility. Again explained process of insurance eligibility and bed search process.  Both [name] and Claris Gower express that they remain hopeful for ongoing recovery and time for outcomes is needed. Share their understanding that they feel Ms. Rupe's Parkinson's disease has remained stable and do not feel this admission is related to that disease process. Share their understanding of chronic disease trajectory. Struggling with acceptance of "new normal" as Ms. Lukes was fairly independent a few days prior to admission.  Ms. Bubolz reports improvement in pain with consistently receiving as needed pain medications.  Ms. Stribling and her family are faced with ongoing decisions regarding GOC and long term plan complicated by debility and multiple underlying chronic conditions.  PMT to continue to follow to offer ongoing support and address needs as they arise.   Objective:   Vital Signs: BP (!) 109/53 (BP Location: Left Arm)   Pulse 64   Temp 97.8 F (36.6 C)   Resp 16   Ht 5' (1.524 m)   Wt 61.3 kg   SpO2 90%   BMI 26.39 kg/m  SpO2: SpO2: 90 % O2 Device: O2 Device: Nasal Cannula O2 Flow Rate: O2 Flow Rate (L/min): 4 L/min   Palliative Care Assessment & Plan   Assessment/Recommendation/Plan  Full Code Continue Full Scope of Care Time for outcomes needed PMT to continue to follow for ongoing needs and  support  Care plan was discussed with nursing staff and primary team.  Thank you for allowing the Palliative Medicine Team to assist in the care of this patient.  Total time:  65 minutes  Time spent includes: Detailed review of medical records (labs, imaging, vital signs), medically appropriate exam (mental status, respiratory, cardiac, skin), discussed with treatment team, counseling and educating patient, family and staff, documenting clinical information, medication management and coordination of care.  Leeanne Deed, DNP, AGNP-C Palliative Medicine   Please contact Palliative Medicine Team phone at [phone] for questions and concerns.

## 2023-02-19 NOTE — Evaluation (Signed)
Occupational Therapy Evaluation Patient Details Name: Stephanie [name] MRN: [id number] DOB: 26-Nov-1945 Today's Date: 02/19/2023   History of Present Illness Pt is a 77 y.o. female with medical history significant for CHF, type diabetes mellitus, GERD, hypertension, dyslipidemia, hypothyroidism, Parkinson's disease, OSA and osteoarthritis, who presented to the emergency room with acute onset of right hip pain, back pain, and inability to walk.  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   Clinical Impression   Patient received for OT evaluation. See flowsheet below for details of function. Generally, patient requiring MOD A for bed mobility to roll, unable to t/f to EOB today (was total Ax2 yesterday with PT), and set up-MAX A for ADLs bed level. No active volitional movement of BIL LE today. UE shoulder strength 3-/5. BIL hand grip functional. Patient will benefit from continued OT while in acute care.       Recommendations for follow up therapy are one component of a multi-disciplinary discharge planning process, led by the attending physician.  Recommendations may be updated based on patient status, additional functional criteria and insurance authorization.   Assistance Recommended at Discharge Frequent or constant Supervision/Assistance  Patient can return home with the following Two people to help with walking and/or transfers;A lot of help with bathing/dressing/bathroom;Assistance with cooking/housework;Direct supervision/assist for  financial management;Direct supervision/assist for medications management;Assist for transportation;Help with stairs or ramp for entrance    Functional Status Assessment  Patient has had a recent decline in their functional status and demonstrates the ability to make significant improvements in function in a reasonable and predictable amount of time.  Equipment Recommendations  Other (comment) (defer to next venue of care)    Recommendations for Other Services       Precautions / Restrictions Precautions Precautions: Fall Restrictions Weight Bearing Restrictions: No      Mobility Bed Mobility Overal bed mobility: Needs Assistance Bed Mobility: Rolling Rolling: Mod assist              Transfers                   General transfer comment: did not transfer today for therapist and pt safety.      Balance                                           ADL either performed or assessed with clinical judgement   ADL Overall ADL's : Needs assistance/impaired     Grooming: Minimal assistance;Oral care;Set up;Brushing hair;Wash/dry face;Bed level Grooming Details (indicate cue type and reason): semi-reclined in bed; OT ran pt's dentures under the sink water; pt able to brush mouth and brush dentures.                       Toileting - Clothing Manipulation Details (indicate cue type and reason): pt dependent at this time; catheter in place.       General ADL Comments: Pt very weak in BIL UE. OT encouraged pt to perform grooming  and eating as independently as possible, as she stated that yesterday she received assistance for feeding. OT provided education to pt on propping up elbows with pillows to decreased the work required for self-feeding; tried this today with RUE during grooming; pt tolerated well. Anticipate MAX A-dependent for bathing, toileting, dressing at this time bed level due to no LE movement and weak UE. Pt was total A x2 yesterday  with PT for bed mobility to EOB. Required MOD A x1 to roll today for placing new bed pad. Dependent x2 for scooting up in bed.     Vision Patient Visual Report: No change from baseline       Perception     Praxis      Pertinent Vitals/Pain Pain Assessment Pain Assessment: 0-10 Pain Score:  (unrated; moderate) Pain Location: R thoracic side/back Pain Descriptors / Indicators: Sore Pain Intervention(s): Limited activity within patient's tolerance, Monitored during session     Hand Dominance Right   Extremity/Trunk Assessment Upper Extremity Assessment Upper Extremity Assessment: Generalized weakness;RUE deficits/detail;LUE deficits/detail RUE Deficits / Details: Pt able to flex BIL UE through full ROM at shoulders, but very quickly then had to drop them to the bed 2/2 to fatigue. Good BIL hand grip. LUE Deficits / Details: Pt able to flex BIL UE through full ROM at shoulders, but very quickly then had to drop them to the bed 2/2 to fatigue. Good BIL hand grip.   Lower Extremity Assessment Lower Extremity Assessment: RLE deficits/detail;LLE deficits/detail RLE Deficits / Details: Pt BIL LE with no volitional movement today; was spasming intermittently at feet and lower leg; pt stating not painful. LLE Deficits / Details: Pt BIL LE with no volitional movement today; was spasming intermittently at feet and lower leg; pt stating not painful.       Communication Communication Communication: Other (comment) (difficult to understand during session; unclear etiology)   Cognition Arousal/Alertness: Awake/alert Behavior During Therapy: WFL for tasks assessed/performed Overall Cognitive Status: Within Functional Limits for tasks assessed                                 General Comments: Pleasant, motivated     General Comments  Pt on 4L O2, saturation 90-92% at rest. Pt states she is on 2L at home.    Exercises     Shoulder Instructions      Home Living  Family/patient expects to be discharged to:: Private residence Living Arrangements: Children Available Help at Discharge: Family;Available PRN/intermittently Type of Home: House Home Access: Stairs to enter [employer] of Steps: 2 Entrance Stairs-Rails: Right;Left Home Layout: One level     Bathroom Shower/Tub: Tub/shower unit;Sponge bathes at baseline   [employer]: Standard     Home Equipment: Rollator (4 wheels);Cane - quad;Shower seat;BSC/3in1;Rolling Environmental consultant (2 wheels)   Additional Comments: Lives with oldest daughter who works during the day, 2LO2 at baseline      Prior Functioning/Environment Prior Level of Function : Needs assist             Mobility Comments: Mod Ind amb household distances only with a rollator, uses her rollator seat for longer distances with assist from family, five falls in the last 6 months secondary to BLE buckling ADLs Comments: Daughter assists with ADLs as needed        OT Problem List: Decreased strength;Decreased activity tolerance;Impaired balance (sitting and/or standing)      OT Treatment/Interventions: Self-care/ADL training;Therapeutic exercise;Therapeutic  activities    OT Goals(Current goals can be found in the care plan section) Acute Rehab OT Goals Patient Stated Goal: Get better OT Goal Formulation: With patient/family Time For Goal Achievement: 03/05/23 Potential to Achieve Goals: Poor ADL Goals Pt Will Perform Upper Body Dressing: with modified independence;sitting Pt Will Perform Lower Body Dressing: with modified independence;bed level Pt Will Transfer to Toilet: with min assist;bedside commode;with transfer board Pt Will Perform Toileting - Clothing Manipulation and hygiene: with min assist;sitting/lateral leans  OT Frequency: Min 1X/week    Co-evaluation              AM-PAC OT "6 Clicks" Daily Activity     Outcome Measure Help from another person eating meals?: A Little Help from another  person taking care of personal grooming?: A Little Help from another person toileting, which includes using toliet, bedpan, or urinal?: Total Help from another person bathing (including washing, rinsing, drying)?: Total Help from another person to put on and taking off regular upper body clothing?: A Lot Help from another person to put on and taking off regular lower body clothing?: Total 6 Click Score: 11   End of Session Nurse Communication: Mobility status  Activity Tolerance: Patient tolerated treatment well Patient left: in bed;with call bell/phone within reach;with bed alarm set;with family/visitor present  OT Visit Diagnosis: Muscle weakness (generalized) (M62.81)                Time: 4098-1191 OT Time Calculation (min): 30 min Charges:  OT General Charges $OT Visit: 1 Visit OT Evaluation $OT Eval Moderate Complexity: 1 Mod OT Treatments $Self Care/Home Management : 8-22 mins  Linward Foster, MS, OTR/L   Alvester Morin 02/19/2023, 12:52 PM

## 2023-02-20 DIAGNOSIS — M5137 Other intervertebral disc degeneration, lumbosacral region: Secondary | ICD-10-CM | POA: Diagnosis not present

## 2023-02-20 DIAGNOSIS — R262 Difficulty in walking, not elsewhere classified: Secondary | ICD-10-CM | POA: Diagnosis not present

## 2023-02-20 DIAGNOSIS — R339 Retention of urine, unspecified: Secondary | ICD-10-CM

## 2023-02-20 DIAGNOSIS — G20B2 Parkinson's disease with dyskinesia, with fluctuations: Secondary | ICD-10-CM | POA: Diagnosis not present

## 2023-02-20 DIAGNOSIS — M25551 Pain in right hip: Secondary | ICD-10-CM | POA: Diagnosis not present

## 2023-02-20 DIAGNOSIS — L03119 Cellulitis of unspecified part of limb: Secondary | ICD-10-CM | POA: Diagnosis not present

## 2023-02-20 LAB — GLUCOSE, CAPILLARY
Glucose-Capillary: 121 mg/dL — ABNORMAL HIGH (ref 70–99)
Glucose-Capillary: 163 mg/dL — ABNORMAL HIGH (ref 70–99)
Glucose-Capillary: 151 mg/dL — ABNORMAL HIGH (ref 70–99)

## 2023-02-20 LAB — BASIC METABOLIC PANEL
Anion gap: 9 (ref 5–15)
BUN: 29 mg/dL — ABNORMAL HIGH (ref 8–23)
CO2: 30 mmol/L (ref 22–32)
Calcium: 9 mg/dL (ref 8.9–10.3)
Chloride: 97 mmol/L — ABNORMAL LOW (ref 98–111)
Creatinine, Ser: 0.78 mg/dL (ref 0.44–1.00)
GFR, Estimated: >60 mL/min (ref >60)
Glucose, Bld: 125 mg/dL — ABNORMAL HIGH (ref 70–99)
Potassium: 4.6 mmol/L (ref 3.5–5.1)
Sodium: 136 mmol/L (ref 135–145)

## 2023-02-20 LAB — CBC
HCT: 38.9 % (ref 36.0–46.0)
Hemoglobin: 12.5 g/dL (ref 12.0–15.0)
MCH: 31.2 pg (ref 26.0–34.0)
MCHC: 32.1 g/dL (ref 30.0–36.0)
MCV: 97 fL (ref 80.0–100.0)
Platelets: 250 10*3/uL (ref 150–400)
RBC: 4.01 MIL/uL (ref 3.87–5.11)
RDW: 15.4 % (ref 11.5–15.5)
WBC: 18.5 10*3/uL — ABNORMAL HIGH (ref 4.0–10.5)
nRBC: 0 % (ref 0.0–0.2)

## 2023-02-20 MED ORDER — SODIUM CHLORIDE 0.9 % IV SOLN
1.0000 g | INTRAVENOUS | Status: AC
  Administered 2023-02-20 – 2023-02-24 (×5): 1 g via INTRAVENOUS
  Filled 2023-02-20: qty 1
  Filled 2023-02-20 (×2): qty 10
  Filled 2023-02-20 (×2): qty 1

## 2023-02-20 MED ORDER — IPRATROPIUM-ALBUTEROL 0.5-2.5 (3) MG/3ML IN SOLN
3.0000 mL | Freq: Four times a day (QID) | RESPIRATORY_TRACT | Status: DC | PRN
  Administered 2023-02-21 (×2): 3 mL via RESPIRATORY_TRACT
  Filled 2023-02-20 (×2): qty 3

## 2023-02-20 MED ORDER — SODIUM CHLORIDE 0.9 % IV SOLN
500.0000 mg | INTRAVENOUS | Status: DC
  Administered 2023-02-20 – 2023-02-22 (×3): 500 mg via INTRAVENOUS
  Filled 2023-02-20: qty 5
  Filled 2023-02-20 (×2): qty 500
  Filled 2023-02-20: qty 5

## 2023-02-20 MED ORDER — FUROSEMIDE 10 MG/ML IJ SOLN
40.0000 mg | Freq: Two times a day (BID) | INTRAMUSCULAR | Status: DC
  Administered 2023-02-20 – 2023-02-21 (×3): 40 mg via INTRAVENOUS
  Filled 2023-02-20 (×3): qty 4

## 2023-02-20 NOTE — Plan of Care (Signed)

## 2023-02-20 NOTE — Plan of Care (Signed)
Problem: Education: Goal: Ability to describe self-care measures that may prevent or decrease complications (Diabetes Survival Skills Education) will improve 02/20/2023 1940 by Glenford Peers, RN Outcome: Progressing 02/20/2023 1939 by Glenford Peers, RN Outcome: Progressing Goal: Individualized Educational Video(s) 02/20/2023 1940 by Glenford Peers, RN Outcome: Progressing 02/20/2023 1939 by Glenford Peers, RN Outcome: Progressing   Problem: Coping: Goal: Ability to adjust to condition or change in health will improve 02/20/2023 1940 by Glenford Peers, RN Outcome: Progressing 02/20/2023 1939 by Glenford Peers, RN Outcome: Progressing   Problem: Fluid Volume: Goal: Ability to maintain a balanced intake and output will improve 02/20/2023 1940 by Glenford Peers, RN Outcome: Progressing 02/20/2023 1939 by Glenford Peers, RN Outcome: Progressing   Problem: Health Behavior/Discharge Planning: Goal: Ability to identify and utilize available resources and services will improve 02/20/2023 1940 by Glenford Peers, RN Outcome: Progressing 02/20/2023 1939 by Glenford Peers, RN Outcome: Progressing Goal: Ability to manage health-related needs will improve 02/20/2023 1940 by Glenford Peers, RN Outcome: Progressing 02/20/2023 1939 by Glenford Peers, RN Outcome: Progressing   Problem: Metabolic: Goal: Ability to maintain appropriate glucose levels will improve 02/20/2023 1940 by Glenford Peers, RN Outcome: Progressing 02/20/2023 1939 by Glenford Peers, RN Outcome: Progressing   Problem: Nutritional: Goal: Maintenance of adequate nutrition will improve 02/20/2023 1940 by Glenford Peers, RN Outcome: Progressing 02/20/2023 1939 by Glenford Peers, RN Outcome: Progressing Goal: Progress toward achieving an optimal weight will improve 02/20/2023 1940 by Glenford Peers, RN Outcome: Progressing 02/20/2023 1939 by Glenford Peers,  RN Outcome: Progressing   Problem: Skin Integrity: Goal: Risk for impaired skin integrity will decrease 02/20/2023 1940 by Glenford Peers, RN Outcome: Progressing 02/20/2023 1939 by Glenford Peers, RN Outcome: Progressing   Problem: Tissue Perfusion: Goal: Adequacy of tissue perfusion will improve 02/20/2023 1940 by Glenford Peers, RN Outcome: Progressing 02/20/2023 1939 by Glenford Peers, RN Outcome: Progressing   Problem: Education: Goal: Knowledge of General Education information will improve Description: Including pain rating scale, medication(s)/side effects and non-pharmacologic comfort measures 02/20/2023 1940 by Glenford Peers, RN Outcome: Progressing 02/20/2023 1939 by Glenford Peers, RN Outcome: Progressing   Problem: Health Behavior/Discharge Planning: Goal: Ability to manage health-related needs will improve 02/20/2023 1940 by Glenford Peers, RN Outcome: Progressing 02/20/2023 1939 by Glenford Peers, RN Outcome: Progressing   Problem: Clinical Measurements: Goal: Ability to maintain clinical measurements within normal limits will improve 02/20/2023 1940 by Glenford Peers, RN Outcome: Progressing 02/20/2023 1939 by Glenford Peers, RN Outcome: Progressing Goal: Will remain free from infection 02/20/2023 1940 by Glenford Peers, RN Outcome: Progressing 02/20/2023 1939 by Glenford Peers, RN Outcome: Progressing Goal: Diagnostic test results will improve 02/20/2023 1940 by Glenford Peers, RN Outcome: Progressing 02/20/2023 1939 by Glenford Peers, RN Outcome: Progressing Goal: Respiratory complications will improve 02/20/2023 1940 by Glenford Peers, RN Outcome: Progressing 02/20/2023 1939 by Glenford Peers, RN Outcome: Progressing Goal: Cardiovascular complication will be avoided 02/20/2023 1940 by Glenford Peers, RN Outcome: Progressing 02/20/2023 1939 by Glenford Peers, RN Outcome: Progressing    Problem: Activity: Goal: Risk for activity intolerance will decrease 02/20/2023 1940 by Glenford Peers, RN Outcome: Progressing 02/20/2023 1939 by Glenford Peers, RN Outcome: Progressing   Problem: Nutrition: Goal: Adequate nutrition will be maintained 02/20/2023 1940 by Glenford Peers, RN Outcome: Progressing 02/20/2023 1939 by Glenford Peers, RN Outcome: Progressing  Problem: Coping: Goal: Level of anxiety will decrease 02/20/2023 1940 by Glenford Peers, RN Outcome: Progressing 02/20/2023 1939 by Glenford Peers, RN Outcome: Progressing   Problem: Elimination: Goal: Will not experience complications related to bowel motility 02/20/2023 1940 by Glenford Peers, RN Outcome: Progressing 02/20/2023 1939 by Glenford Peers, RN Outcome: Progressing Goal: Will not experience complications related to urinary retention 02/20/2023 1940 by Glenford Peers, RN Outcome: Progressing 02/20/2023 1939 by Glenford Peers, RN Outcome: Progressing   Problem: Pain Managment: Goal: General experience of comfort will improve 02/20/2023 1940 by Glenford Peers, RN Outcome: Progressing 02/20/2023 1939 by Glenford Peers, RN Outcome: Progressing   Problem: Safety: Goal: Ability to remain free from injury will improve 02/20/2023 1940 by Glenford Peers, RN Outcome: Progressing 02/20/2023 1939 by Glenford Peers, RN Outcome: Progressing   Problem: Skin Integrity: Goal: Risk for impaired skin integrity will decrease 02/20/2023 1940 by Glenford Peers, RN Outcome: Progressing 02/20/2023 1939 by Glenford Peers, RN Outcome: Progressing

## 2023-02-20 NOTE — Progress Notes (Signed)
Daily Progress Note   Patient Name: Stephanie [name]       Date: 02/20/2023 DOB: 07/05/1946  Age: 77 y.o. MRN#: [id number] Attending Physician: Charise Killian, MD Primary Care Physician: Marguarite Arbour, MD Admit Date: 02/16/2023  Reason for Consultation/Follow-up: Establishing goals of care  HPI/Brief Hospital Review: 77 y.o. female  with past medical history of CHF, T2DM, HTN, HLD, hypothyroidism, OSA, arthritis and Parkinson's disease admitted from home on 02/16/2023 with worsening right hip pain with associated weakness which originally started causing her pain a few months prior. She reports that until 3 days prior to ED arrival she was able to ambulate with assistance of walker. Reports of increased pain and weakness eventually leading to her inability to ambulate even with assistance and inability to move BLE. Noted erythema and wounds to BLE.   Imaging negative for cauda equina syndrome   CT T-spine 1. Multilevel severe degenerative endplate changes in the lower cervical spine, at T1-2, T3-4, and from T9-10 through L1-2. 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 2. Facet arthropathy and gouty tophi contribute to severe spinal canal stenosis and severe bilateral neural foraminal narrowing at T10-11, as well as moderate spinal canal stenosis and severe bilateral neural foraminal narrowing at T11-12. 3. Aortic Atherosclerosis (ICD10-I70.0).   MRI T-spine Erosive spondyloarthropathy, most notable at T10-11 and T11-12 with abnormal paraspinal and epidural soft tissue which is partially calcified and favored to reflect gout  as described on CT. Severe spinal stenosis at T10-11 and T11-12 with mild spinal cord signal abnormality/edema.   Palliative medicine was consulted for assisting with goals of care conversations.  Subjective: Extensive chart review has been completed prior to meeting patient including labs, vital signs, imaging, progress notes, orders, and available advanced directive documents from current and previous encounters.    Visited with Ms. Diemer at her bedside. Awake and alert, finishing her breakfast. Reports some improvement in her pain today with ongoing use of as needed medications. Shares her understanding that she was told by primary team that she has pneumonia. She feels this is just a small set back and continues to voice hope and desire to regain strength and mobility and be strong enough for  surgical intervention. Again references her strong religious background as her support for ongoing hope and miracles.  Expressed concern about acute infection related to PNA in relation to overall poor condition due to multiple underlying chronic medical conditions.  Attempted to call daughter-Donna, unsuccessful, call disconnected prior to being able to leave VM.  PMT to continue to follow for ongoing needs and support.   Objective:  Physical Exam Constitutional:      General: She is not in acute distress.    Appearance: She is ill-appearing.     Comments: Temporal wasting  Pulmonary:     Effort: Pulmonary effort is normal. No respiratory distress.  Skin:    General: Skin is warm and dry.     Findings: Bruising present.  Neurological:     Mental Status: She is alert.     Motor: Weakness present.             Vital Signs: BP 132/73 (BP Location: Left Arm)   Pulse 70   Temp 98.2 F (36.8 C)   Resp 20   Ht 5' (1.524 m)   Wt 61.3 kg   SpO2 (!) 88%   BMI 26.39 kg/m  SpO2: SpO2: (!) 88 % O2 Device: O2 Device: Nasal Cannula O2 Flow Rate: O2 Flow Rate (L/min): 4  L/min   Palliative Care Assessment & Plan   Assessment/Recommendation/Plan  Full Code-Full Scope Ongoing GOC needed PMT to continue to follow for ongoing needs and support  Care plan was discussed with primary team.  Thank you for allowing the Palliative Medicine Team to assist in the care of this patient.  Total time:  35 minutes  Time spent includes: Detailed review of medical records (labs, imaging, vital signs), medically appropriate exam (mental status, respiratory, cardiac, skin), discussed with treatment team, counseling and educating patient, family and staff, documenting clinical information, medication management and coordination of care.  Leeanne Deed, DNP, AGNP-C Palliative Medicine   Please contact Palliative Medicine Team phone at [phone] for questions and concerns.

## 2023-02-20 NOTE — Progress Notes (Addendum)
PROGRESS NOTE    Stephanie [name]  RUE:[id number] DOB: 1946-01-17 DOA: 02/16/2023 PCP: Marguarite Arbour, MD   Assessment & Plan:   Principal Problem:   Unable to ambulate Active Problems:   Acute on chronic HFrEF (heart failure with reduced ejection fraction)   Parkinson's disease   Hypertensive urgency   Type 2 diabetes mellitus with peripheral neuropathy   Hypokalemia   Gout   GERD without esophagitis   Thoracic spondylosis with myelopathy   Spinal stenosis, thoracic  Assessment and Plan:  Unable to ambulate: likely secondary thoracic stenosis & thoracic myelopathy. Poor surgical candidate as per neuro surg.  PT/OT recs SNF. Pt and pt's family are agreeable  Cellulitis: of b/l LE. Abxs changed to IV rocephin. W/ possible venous stasis    Acute on chronic systolic CHF: increase IV lasix to BID. Monitor I/Os  Likely pneumonia: as per repeat CXR. Changed IV abxs to IV rocephin, azithromycin   Hypertensive urgency: urgency resolved but still w/ HTN. Continue metoprolol, losartan    Parkinson's disease: continue on home dose of sinemet, entacapone, amantadine, pramipexole. Palliative care following and recs apprec   Hypokalemia: WNL today    DM2: HbA1c 6.1, well controlled. Continue on SSI w/ accuchecks   Peripheral neuropathy: continue on home dose of gabapentin    GERD: continue on PPI    Gout: continue on home dose of allopurinol       DVT prophylaxis: lovenox  Code Status: full  Family Communication: discussed pt's care w/ pt's daughter, [name], and answered her questions  Disposition Plan: likely d/c to SNF   Level of care: Med-Surg  Status is: Inpatient Remains inpatient appropriate because: severity of illness, requiring IV abxs & IV lasix     Consultants:  Neuro surg   Procedures:   Antimicrobials: rocephin, azithromycin   Subjective: Pt c/o shortness of breath   Objective: Vitals:   02/19/23 1000 02/19/23 1227 02/19/23 1550 02/19/23  2132  BP: (!) 101/57 (!) 122/56 (!) 109/53 114/71  Pulse:  73 64   Resp: 18 18 16 20   Temp:   97.8 F (36.6 C) 98.5 F (36.9 C)  TempSrc:      SpO2: 97% 98% 90% 91%  Weight:      Height:        Intake/Output Summary (Last 24 hours) at 02/20/2023 0812 Last data filed at 02/20/2023 0447 Gross per 24 hour  Intake 339.46 ml  Output 1375 ml  Net -1035.54 ml   Filed Weights   02/17/23 0626  Weight: 61.3 kg    Examination:  General exam: Appears uncomfortable. Appears older than stated age Respiratory system: course breath sounds b/l  Cardiovascular system: S1 & S2+. No rubs or clicks  Gastrointestinal system: abd is soft, NT, ND & hypoactive bowel sounds  Central nervous system: Alert and awake  Psychiatry: judgement and insight appears at baseline. Appropriate mood and affect     Data Reviewed: I have personally reviewed following labs and imaging studies  CBC: Recent Labs  Lab 02/16/23 1850 02/17/23 0440 02/19/23 0842  WBC 7.5 8.3 12.4*  NEUTROABS 6.7  --   --   HGB 12.2 12.5 11.6*  HCT 37.1 38.6 36.3  MCV 95.1 93.9 96.8  PLT 294 302 248   Basic Metabolic Panel: Recent Labs  Lab 02/16/23 1850 02/17/23 0440 02/17/23 1540 02/19/23 0842  NA 139 140 141 140  K 2.8* 2.8* 3.1* 4.2  CL 101 103 101 101  CO2 27 28 31  32  GLUCOSE 104* 92 194* 114*  BUN 45* 34* 28* 21  CREATININE 1.03* 0.68 0.73 0.58  CALCIUM 8.4* 8.5* 8.4* 8.8*  MG  --  2.1  --   --    GFR: Estimated Creatinine Clearance: 48.9 mL/min (by C-G formula based on SCr of 0.58 mg/dL). Liver Function Tests: Recent Labs  Lab 02/16/23 1850  AST 23  ALT <5  ALKPHOS 91  BILITOT 1.1  PROT 6.4*  ALBUMIN 3.3*   No results for input(s): "LIPASE", "AMYLASE" in the last 168 hours. No results for input(s): "AMMONIA" in the last 168 hours. Coagulation Profile: No results for input(s): "INR", "PROTIME" in the last 168 hours. Cardiac Enzymes: No results for input(s): "CKTOTAL", "CKMB", "CKMBINDEX",  "TROPONINI" in the last 168 hours. BNP (last 3 results) No results for input(s): "PROBNP" in the last 8760 hours. HbA1C: No results for input(s): "HGBA1C" in the last 72 hours.  CBG: Recent Labs  Lab 02/19/23 0741 02/19/23 1148 02/19/23 1649 02/19/23 2128 02/20/23 0757  GLUCAP 98 171* 236* 145* 121*   Lipid Profile: No results for input(s): "CHOL", "HDL", "LDLCALC", "TRIG", "CHOLHDL", "LDLDIRECT" in the last 72 hours. Thyroid Function Tests: No results for input(s): "TSH", "T4TOTAL", "FREET4", "T3FREE", "THYROIDAB" in the last 72 hours. Anemia Panel: No results for input(s): "VITAMINB12", "FOLATE", "FERRITIN", "TIBC", "IRON", "RETICCTPCT" in the last 72 hours. Sepsis Labs: No results for input(s): "PROCALCITON", "LATICACIDVEN" in the last 168 hours.  No results found for this or any previous visit (from the past 240 hour(s)).       Radiology Studies: DG Chest Port 1 View  Result Date: 02/19/2023 CLINICAL DATA:  Shortness of breath EXAM: PORTABLE CHEST 1 VIEW COMPARISON:  02/16/2023 FINDINGS: Heart borderline in size. Mediastinal contours within normal limits. Aortic atherosclerosis. Consolidation in the lower lobes concerning for pneumonia, new since prior study. No visible significant effusions or pneumothorax. No acute bony abnormality. IMPRESSION: New bilateral lower lobe consolidation, right worse than left concerning for pneumonia Electronically Signed   By: Charlett Nose M.D.   On: 02/19/2023 17:05        Scheduled Meds:  acetaminophen  1,000 mg Oral Once   allopurinol  100 mg Oral Daily   amantadine  100 mg Oral BID   aspirin EC  81 mg Oral Daily   busPIRone  10 mg Oral BID   carbidopa-levodopa  1 tablet Oral QHS   carbidopa-levodopa  2 tablet Oral TID WC & HS   Chlorhexidine Gluconate Cloth  6 each Topical Daily   diclofenac Sodium  4 g Topical QID   enoxaparin (LOVENOX) injection  40 mg Subcutaneous Q24H   entacapone  100 mg Oral TID   furosemide  40 mg  Intravenous Daily   gabapentin  300 mg Oral QHS   insulin aspart  0-5 Units Subcutaneous QHS   insulin aspart  0-9 Units Subcutaneous TID WC   levothyroxine  75 mcg Oral Q0600   LORazepam  0.5 mg Intravenous Once   losartan  100 mg Oral Daily   multivitamin with minerals  1 tablet Oral Daily   pantoprazole  40 mg Oral Daily   polyethylene glycol  17 g Oral Daily   pramipexole  0.125 mg Oral TID AC & HS   pravastatin  40 mg Oral QHS   QUEtiapine  100 mg Oral QHS   sertraline  25 mg Oral Daily   Continuous Infusions:  sodium chloride Stopped (02/19/23 1217)    ceFAZolin (ANCEF) IV 1 g (02/19/23 2350)  LOS: 3 days    Time spent: 35 mins    Charise Killian, MD [facility] Pager [phone]  If 7PM-7AM, please contact night-coverage [email or web address] 02/20/2023, 8:12 AM

## 2023-02-21 DIAGNOSIS — J189 Pneumonia, unspecified organism: Secondary | ICD-10-CM | POA: Diagnosis not present

## 2023-02-21 DIAGNOSIS — R262 Difficulty in walking, not elsewhere classified: Secondary | ICD-10-CM | POA: Diagnosis not present

## 2023-02-21 DIAGNOSIS — G20B2 Parkinson's disease with dyskinesia, with fluctuations: Secondary | ICD-10-CM | POA: Diagnosis not present

## 2023-02-21 LAB — CBC
HCT: 38.9 % (ref 36.0–46.0)
Hemoglobin: 12 g/dL (ref 12.0–15.0)
MCH: 31 pg (ref 26.0–34.0)
MCHC: 30.8 g/dL (ref 30.0–36.0)
MCV: 100.5 fL — ABNORMAL HIGH (ref 80.0–100.0)
Platelets: 232 10*3/uL (ref 150–400)
RBC: 3.87 MIL/uL (ref 3.87–5.11)
RDW: 15.7 % — ABNORMAL HIGH (ref 11.5–15.5)
WBC: 13.8 10*3/uL — ABNORMAL HIGH (ref 4.0–10.5)
nRBC: 0 % (ref 0.0–0.2)

## 2023-02-21 LAB — GLUCOSE, CAPILLARY
Glucose-Capillary: 136 mg/dL — ABNORMAL HIGH (ref 70–99)
Glucose-Capillary: 276 mg/dL — ABNORMAL HIGH (ref 70–99)
Glucose-Capillary: 96 mg/dL (ref 70–99)
Glucose-Capillary: 132 mg/dL — ABNORMAL HIGH (ref 70–99)

## 2023-02-21 LAB — BASIC METABOLIC PANEL
Anion gap: 12 (ref 5–15)
BUN: 35 mg/dL — ABNORMAL HIGH (ref 8–23)
CO2: 32 mmol/L (ref 22–32)
Calcium: 8.8 mg/dL — ABNORMAL LOW (ref 8.9–10.3)
Chloride: 94 mmol/L — ABNORMAL LOW (ref 98–111)
Creatinine, Ser: 0.94 mg/dL (ref 0.44–1.00)
GFR, Estimated: >60 mL/min (ref >60)
Glucose, Bld: 102 mg/dL — ABNORMAL HIGH (ref 70–99)
Potassium: 4.4 mmol/L (ref 3.5–5.1)
Sodium: 138 mmol/L (ref 135–145)

## 2023-02-21 NOTE — Discharge Instructions (Signed)

## 2023-02-21 NOTE — TOC Progression Note (Signed)
Transition of Care [facility]) - Progression Note    Patient Details  Name: Stephanie [name] MRN: [id number] Date of Birth: [birth year]  Transition of Care [facility]) CM/SW Contact  Marlowe Sax, RN Phone Number: 02/21/2023, 4:16 PM  Clinical Narrative:     TOC to folloaw and assist with DC planning When medically ready will send bed search  Expected Discharge Plan: Skilled Nursing Facility Barriers to Discharge: SNF Pending bed offer, Continued Medical Work up  Expected Discharge Plan and Services   Discharge Planning Services: CM Consult   Living arrangements for the past 2 months: Single Family Home                 DME Arranged: N/A DME Agency: NA       HH Arranged: NA           Social Determinants of Health (SDOH) Interventions SDOH Screenings   Food Insecurity: No Food Insecurity (02/17/2023)  Housing: Low Risk  (02/17/2023)  Transportation Needs: No Transportation Needs (02/17/2023)  Utilities: Not At Risk (02/17/2023)  Tobacco Use: Low Risk  (02/17/2023)    Readmission Risk Interventions    09/03/2021    4:31 PM  Readmission Risk Prevention Plan  Transportation Screening Complete  PCP or Specialist Appt within 3-5 Days Complete  HRI or Home Care Consult Complete  Social Work Consult for Recovery Care Planning/Counseling Complete  Palliative Care Screening Not Applicable  Medication Review Oceanographer) Complete

## 2023-02-21 NOTE — NC FL2 (Signed)
[place] MEDICAID FL2 LEVEL OF CARE FORM     IDENTIFICATION  Patient Name: Stephanie [name] Birthdate: Aug 03, 1946 Sex: female Admission Date (Current Location): 02/16/2023  [facility] and [place] Number:  Chiropodist and Address:  [facility], [address], [place], [place] [address]      Provider Number: [id number]  Attending Physician Name and Address:  Charise Killian, MD  Relative Name and Phone Number:  Conley Canal [phone]    Current Level of Care: Hospital Recommended Level of Care: Skilled Nursing Facility Prior Approval Number:    Date Approved/Denied:   PASRR Number: [id number]  Discharge Plan: SNF    Current Diagnoses: Patient Active Problem List   Diagnosis Date Noted   Unable to ambulate 02/17/2023   Hypokalemia 02/17/2023   GERD without esophagitis 02/17/2023   Hypertensive urgency 02/17/2023   Thoracic spondylosis with myelopathy 02/17/2023   Spinal stenosis, thoracic 02/17/2023   Pain and swelling of lower leg 12/07/2022   Right knee pain 10/11/2022   Effusion of right knee 10/08/2022   Chronic hypoxic respiratory failure 10/08/2022   Weight loss 10/08/2022   Dysphagia 02/12/2022   Chronic back pain 02/12/2022   HFrEF (heart failure with reduced ejection fraction) 02/12/2022   SIRS (systemic inflammatory response syndrome) 02/12/2022   CAP (community acquired pneumonia) 09/01/2021   Acute on chronic HFrEF (heart failure with reduced ejection fraction) 08/31/2021   Hypertension associated with diabetes 08/31/2021   Acute on chronic systolic (congestive) heart failure 07/20/2021   SOB (shortness of breath) 07/20/2021   Acute respiratory failure with hypoxia 07/20/2021   Type 2 diabetes mellitus with peripheral neuropathy    Hypothyroid    Parkinson's disease    Hyperlipidemia associated with type 2 diabetes mellitus    Gout 11/01/2020   Left bundle branch block 05/09/2019   Hyponatremia  12/31/2015    Orientation RESPIRATION BLADDER Height & Weight     Self, Situation, Time, Place  O2 (5 liters) Continent, External catheter Weight: 61.3 kg Height:  5' (152.4 cm)  BEHAVIORAL SYMPTOMS/MOOD NEUROLOGICAL BOWEL NUTRITION STATUS      Continent Diet (See DC summary)  AMBULATORY STATUS COMMUNICATION OF NEEDS Skin   Extensive Assist Verbally Normal                       Personal Care Assistance Level of Assistance  Feeding, Dressing, Bathing   Feeding assistance: Limited assistance Dressing Assistance: Maximum assistance     Functional Limitations Info  Sight, Hearing, Speech Sight Info: Adequate Hearing Info: Adequate Speech Info: Adequate    SPECIAL CARE FACTORS FREQUENCY  PT (By licensed PT), OT (By licensed OT)     PT Frequency: 5 times per week OT Frequency: 5 times per week            Contractures Contractures Info: Not present    Additional Factors Info  Allergies, Code Status Code Status Info: Full code Allergies Info: NKDA           Current Medications (02/21/2023):  This is the current hospital active medication list Current Facility-Administered Medications  Medication Dose Route Frequency Provider Last Rate Last Admin   0.9 %  sodium chloride infusion   Intravenous PRN Charise Killian, MD   Stopped at 02/19/23 1217   acetaminophen (TYLENOL) tablet 650 mg  650 mg Oral Q6H PRN Mansy, Jan A, MD   650 mg at 02/18/23 0932   Or   acetaminophen (TYLENOL) suppository 650  mg  650 mg Rectal Q6H PRN Mansy, Jan A, MD       acetaminophen (TYLENOL) tablet 1,000 mg  1,000 mg Oral Once Triplett, Cari B, FNP       allopurinol (ZYLOPRIM) tablet 100 mg  100 mg Oral Daily Mansy, Jan A, MD   100 mg at 02/21/23 0934   amantadine (SYMMETREL) capsule 100 mg  100 mg Oral BID Charise Killian, MD   100 mg at 02/21/23 0935   aspirin EC tablet 81 mg  81 mg Oral Daily Mansy, Jan A, MD   81 mg at 02/21/23 0933   azithromycin (ZITHROMAX) 500 mg in sodium  chloride 0.9 % 250 mL IVPB  500 mg Intravenous Q24H Charise Killian, MD 250 mL/hr at 02/21/23 1000 500 mg at 02/21/23 1000   busPIRone (BUSPAR) tablet 10 mg  10 mg Oral BID Mansy, Jan A, MD   10 mg at 02/21/23 0933   carbidopa-levodopa (SINEMET CR) 50-200 MG per tablet controlled release 1 tablet  1 tablet Oral QHS Mansy, Jan A, MD   1 tablet at 02/20/23 2142   carbidopa-levodopa (SINEMET IR) 25-100 MG per tablet immediate release 2 tablet  2 tablet Oral TID WC & HS Mansy, Jan A, MD   2 tablet at 02/21/23 1256   cefTRIAXone (ROCEPHIN) 1 g in sodium chloride 0.9 % 100 mL IVPB  1 g Intravenous Q24H Charise Killian, MD 200 mL/hr at 02/21/23 0944 1 g at 02/21/23 0944   Chlorhexidine Gluconate Cloth 2 % PADS 6 each  6 each Topical Daily Agbata, Tochukwu, MD   6 each at 02/21/23 0935   diclofenac Sodium (VOLTAREN) 1 % topical gel 4 g  4 g Topical QID Mansy, Jan A, MD   4 g at 02/21/23 1400   enoxaparin (LOVENOX) injection 40 mg  40 mg Subcutaneous Q24H Mansy, Jan A, MD   40 mg at 02/21/23 4098   entacapone (COMTAN) tablet 100 mg  100 mg Oral TID Mansy, Jan A, MD   100 mg at 02/21/23 0935   fluticasone (FLONASE) 50 MCG/ACT nasal spray 1-2 spray  1-2 spray Each Nare Daily PRN Mansy, Jan A, MD       furosemide (LASIX) injection 40 mg  40 mg Intravenous Q12H Charise Killian, MD   40 mg at 02/21/23 0936   gabapentin (NEURONTIN) capsule 300 mg  300 mg Oral QHS Mansy, Jan A, MD   300 mg at 02/20/23 2141   glucose-Vitamin C 4-0.006 GM per chewable tablet 1 tablet  1 tablet Oral PRN Mansy, Jan A, MD       insulin aspart (novoLOG) injection 0-5 Units  0-5 Units Subcutaneous QHS Mansy, Jan A, MD       insulin aspart (novoLOG) injection 0-9 Units  0-9 Units Subcutaneous TID WC Mansy, Jan A, MD   5 Units at 02/21/23 1256   ipratropium-albuterol (DUONEB) 0.5-2.5 (3) MG/3ML nebulizer solution 3 mL  3 mL Nebulization Q6H PRN Charise Killian, MD   3 mL at 02/21/23 1109   labetalol (NORMODYNE) injection 20  mg  20 mg Intravenous Q3H PRN Mansy, Jan A, MD   20 mg at 02/18/23 0758   levothyroxine (SYNTHROID) tablet 75 mcg  75 mcg Oral Q0600 Mansy, Jan A, MD   75 mcg at 02/21/23 0533   LORazepam (ATIVAN) injection 0.5 mg  0.5 mg Intravenous Once Triplett, Cari B, FNP       losartan (COZAAR) tablet 100 mg  100 mg  Oral Daily Mansy, Jan A, MD   100 mg at 02/20/23 0915   magnesium hydroxide (MILK OF MAGNESIA) suspension 30 mL  30 mL Oral Daily PRN Mansy, Jan A, MD   30 mL at 02/21/23 0533   morphine (PF) 2 MG/ML injection 2 mg  2 mg Intravenous Q3H PRN Charise Killian, MD   2 mg at 02/19/23 2241   multivitamin with minerals tablet 1 tablet  1 tablet Oral Daily Mansy, Jan A, MD   1 tablet at 02/21/23 0933   ondansetron (ZOFRAN) tablet 4 mg  4 mg Oral Q6H PRN Mansy, Jan A, MD       Or   ondansetron [facility]) injection 4 mg  4 mg Intravenous Q6H PRN Mansy, Vernetta Honey, MD       oxyCODONE-acetaminophen (PERCOCET) 7.5-325 MG per tablet 1 tablet  1 tablet Oral Q4H PRN Charise Killian, MD   1 tablet at 02/21/23 1108   pantoprazole (PROTONIX) EC tablet 40 mg  40 mg Oral Daily Mansy, Jan A, MD   40 mg at 02/21/23 0933   polyethylene glycol (MIRALAX / GLYCOLAX) packet 17 g  17 g Oral Daily Mansy, Jan A, MD   17 g at 02/21/23 1610   pramipexole (MIRAPEX) tablet 0.125 mg  0.125 mg Oral TID AC & HS Mansy, Jan A, MD   0.125 mg at 02/21/23 1257   pravastatin (PRAVACHOL) tablet 40 mg  40 mg Oral QHS Mansy, Jan A, MD   40 mg at 02/20/23 2200   QUEtiapine (SEROQUEL) tablet 100 mg  100 mg Oral QHS Mansy, Jan A, MD   100 mg at 02/20/23 2139   sertraline (ZOLOFT) tablet 25 mg  25 mg Oral Daily Mansy, Jan A, MD   25 mg at 02/21/23 0933   torsemide (DEMADEX) tablet 20 mg  20 mg Oral Daily PRN Mansy, Jan A, MD       traZODone (DESYREL) tablet 25 mg  25 mg Oral QHS PRN Mansy, Vernetta Honey, MD         Discharge Medications: Please see discharge summary for a list of discharge medications.  Relevant Imaging Results:  Relevant Lab  Results:   Additional Information ss#[id number];  Amadea Keagy Seward Wynonna, RN

## 2023-02-21 NOTE — Progress Notes (Signed)
Occupational Therapy Treatment Patient Details Name: Stephanie [name] MRN: [id number] DOB: 01/12/1946 Today's Date: 02/21/2023   History of present illness Pt is a 77 y.o. female with medical history significant for CHF, type diabetes mellitus, GERD, hypertension, dyslipidemia, hypothyroidism, Parkinson's disease, OSA and osteoarthritis, who presented to the emergency room with acute onset of right hip pain, back pain, and inability to walk.  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   OT comments  Pt seen for OT tx and co-tx with PT to optimize safety with ADL mobility. Pt required MAX A +2 for bed mobility. Engaged in dynamic reaching once seated EOB with BLE stabilized with noted improvement in static sitting balance after reaching anteriorly and L laterally. Once returned to bed, pt instructed in BUE ex with yellow theraband and written HEP. Pt progressing, continues to benefit from skilled OT services. Continue with POC.     Recommendations for follow up therapy are one component of a multi-disciplinary discharge planning process, led by the attending physician.  Recommendations may be updated based on patient status, additional functional criteria and insurance authorization.    Assistance Recommended at Discharge Frequent or constant Supervision/Assistance  Patient can return home with the following  Two people to help with walking and/or transfers;A lot of help with bathing/dressing/bathroom;Assistance with cooking/housework;Direct  supervision/assist for financial management;Direct supervision/assist for medications management;Assist for transportation;Help with stairs or ramp for entrance   Equipment Recommendations  Other (comment) (defer to next venue)    Recommendations for Other Services      Precautions / Restrictions Precautions Precautions: Fall Restrictions Weight Bearing Restrictions: No       Mobility Bed Mobility Overal bed mobility: Needs Assistance Bed Mobility: Rolling, Supine to Sit, Sit to Supine Rolling: Mod assist   Supine to sit: Max assist, +2 for physical assistance Sit to supine: Max assist, +2 for physical assistance   General bed mobility comments: Near total assist for BLE and trunk control, cues for sequencing provided for use of bed rails with log roll training provided    Transfers                   General transfer comment: Unable/unsafe to attempt     Balance Overall balance assessment: Needs assistance Sitting-balance support: Bilateral upper extremity supported, Single extremity supported, Feet unsupported Sitting balance-Leahy Scale: Poor Sitting balance - Comments: Frequent min to mod A for stability during static sitting at the EOB Postural control: Right lateral lean, Posterior lean     Standing balance comment: unable                           ADL either performed or assessed with clinical judgement   ADL                                              Extremity/Trunk Assessment              Vision  Perception     Praxis      Cognition Arousal/Alertness: Awake/alert Behavior During Therapy: WFL for tasks assessed/performed Overall Cognitive Status: Within Functional Limits for tasks assessed                                          Exercises Other Exercises Other Exercises: Dynamic sitting balance training with reaching outside BOS for target with VC for technique to improve trunk  control and balance Other Exercises: Pt instructed in BUE ex with yellow theraband and written HEP, pt completed BUE elbow flexion and shoulder horizontal extension x10 each    Shoulder Instructions       General Comments      Pertinent Vitals/ Pain       Pain Assessment Pain Assessment: No/denies pain  Home Living                                          Prior Functioning/Environment              Frequency  Min 1X/week        Progress Toward Goals  OT Goals(current goals can now be found in the care plan section)  Progress towards OT goals: Progressing toward goals  Acute Rehab OT Goals Patient Stated Goal: get better OT Goal Formulation: With patient/family Time For Goal Achievement: 03/05/23 Potential to Achieve Goals: Poor  Plan Discharge plan remains appropriate;Frequency remains appropriate    Co-evaluation    PT/OT/SLP Co-Evaluation/Treatment: Yes Reason for Co-Treatment: For patient/therapist safety;Complexity of the patient's impairments (multi-system involvement) PT goals addressed during session: Mobility/safety with mobility;Balance;Strengthening/ROM OT goals addressed during session: ADL's and self-care;Strengthening/ROM      AM-PAC OT "6 Clicks" Daily Activity     Outcome Measure   Help from another person eating meals?: A Little Help from another person taking care of personal grooming?: A Little Help from another person toileting, which includes using toliet, bedpan, or urinal?: Total Help from another person bathing (including washing, rinsing, drying)?: Total Help from another person to put on and taking off regular upper body clothing?: A Lot Help from another person to put on and taking off regular lower body clothing?: Total 6 Click Score: 11    End of Session    OT Visit Diagnosis: Muscle weakness (generalized) (M62.81)   Activity Tolerance Patient tolerated treatment well   Patient Left in bed;with call  bell/phone within reach;with bed alarm set   Nurse Communication          Time: 1610-9604 OT Time Calculation (min): 31 min  Charges: OT General Charges $OT Visit: 1 Visit OT Treatments $Therapeutic Exercise: 8-22 mins  Arman Filter., MPH, MS, OTR/L ascom [phone] 02/21/23, 4:22 PM

## 2023-02-21 NOTE — Progress Notes (Signed)
Physical Therapy Treatment Patient Details Name: Stephanie [name] MRN: [id number] DOB: 17-Apr-1946 Today's Date: 02/21/2023   History of Present Illness Pt is a 77 y.o. female with medical history significant for CHF, type diabetes mellitus, GERD, hypertension, dyslipidemia, hypothyroidism, Parkinson's disease, OSA and osteoarthritis, who presented to the emergency room with acute onset of right hip pain, back pain, and inability to walk.  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    PT Comments    Pt was pleasant and motivated to participate during the session and put forth good effort throughout. Pt continued to present with no noted volitional movement of either LE either in supine or sitting open chain at the EOB and required near total assist during log roll training.  Pt presented with poor static and dynamic sitting balance at the EOB but with extensive weight shifting activities pt was able to occasionally maintain sitting balance without assist but primarily required min to mod A for stability.  Pt reported no back pain during the session with SpO2 in the high 80s to low 90s on 5LO2/min, nursing notified.  Pt will benefit from continued PT services upon discharge to safely address deficits listed in patient problem list for decreased caregiver assistance and eventual return to PLOF.     Recommendations for follow up therapy are one component of a multi-disciplinary discharge planning process, led by the attending physician.   Recommendations may be updated based on patient status, additional functional criteria and insurance authorization.  Follow Up Recommendations  Can patient physically be transported by private vehicle: No    Assistance Recommended at Discharge Frequent or constant Supervision/Assistance  Patient can return home with the following Two people to help with walking and/or transfers;A lot of help with bathing/dressing/bathroom;Assistance with cooking/housework;Assistance with feeding;Direct supervision/assist for medications management;Assist for transportation;Help with stairs or ramp for entrance   Equipment Recommendations  None recommended by PT    Recommendations for Other Services       Precautions / Restrictions Precautions Precautions: Fall Restrictions Weight Bearing Restrictions: No     Mobility  Bed Mobility Overal bed mobility: Needs Assistance Bed Mobility: Rolling Rolling: Mod assist   Supine to sit: Max assist, +2 for physical assistance Sit to supine: Max assist, +2 for physical assistance   General bed mobility comments: Near total assist for BLE and trunk control, cues for sequencing provided for use of bed rails with log roll training provided    Transfers                   General transfer comment: Unable/unsafe to attempt    Ambulation/Gait                   Stairs             Wheelchair Mobility    Modified Rankin (Stroke Patients Only)       Balance Overall balance assessment: Needs assistance   Sitting balance-Leahy Scale: Poor Sitting balance - Comments: Frequent min to mod A for stability during static sitting at the EOB Postural control: Right lateral lean, Posterior lean  Standing balance comment: unable                            Cognition Arousal/Alertness: Awake/alert Behavior During Therapy: WFL for tasks assessed/performed Overall Cognitive Status: Within Functional Limits for tasks  assessed                                          Exercises Other Exercises Other Exercises: Log roll training Other Exercises: Static sitting at EOB for improved core strength and static sitting balance Other Exercises: Dynamic sitting balance training with focus on anterior weight shifting and L lateral leans    General Comments        Pertinent Vitals/Pain Pain Assessment Pain Assessment: No/denies pain    Home Living                          Prior Function            PT Goals (current goals can now be found in the care plan section) Progress towards PT goals: Not progressing toward goals - comment (Pt limited by profound BLE weakness)    Frequency    Min 2X/week      PT Plan Current plan remains appropriate    Co-evaluation PT/OT/SLP Co-Evaluation/Treatment: Yes Reason for Co-Treatment: For patient/therapist safety;Complexity of the patient's impairments (multi-system involvement) PT goals addressed during session: Mobility/safety with mobility;Balance;Strengthening/ROM        AM-PAC PT "6 Clicks" Mobility   Outcome Measure  Help needed turning from your back to your side while in a flat bed without using bedrails?: Total Help needed moving from lying on your back to sitting on the side of a flat bed without using bedrails?: Total Help needed moving to and from a bed to a chair (including a wheelchair)?: Total Help needed standing up from a chair using your arms (e.g., wheelchair or bedside chair)?: Total Help needed to walk in hospital room?: Total Help needed climbing 3-5 steps with a railing? : Total 6 Click Score: 6    End of Session Equipment Utilized During Treatment: Gait belt;Oxygen Activity Tolerance: Patient tolerated treatment well Patient left: in bed;with call bell/phone within reach;with bed alarm set;Other (comment) (Pt left with OT in room) Nurse Communication: Mobility status PT Visit Diagnosis: History of  falling (Z91.81);Difficulty in walking, not elsewhere classified (R26.2);Muscle weakness (generalized) (M62.81)     Time: 8295-6213 PT Time Calculation (min) (ACUTE ONLY): 25 min  Charges:  $Therapeutic Activity: 8-22 mins                     D. Scott Edgar Reisz PT, DPT 02/21/23, 3:03 PM

## 2023-02-21 NOTE — Progress Notes (Signed)
PROGRESS NOTE    Stephanie [name]  ZOX:[id number] DOB: Oct 26, 1946 DOA: 02/16/2023 PCP: Marguarite Arbour, MD   Assessment & Plan:   Principal Problem:   Unable to ambulate Active Problems:   Acute on chronic HFrEF (heart failure with reduced ejection fraction)   Parkinson's disease   Hypertensive urgency   Type 2 diabetes mellitus with peripheral neuropathy   Hypokalemia   Gout   GERD without esophagitis   Thoracic spondylosis with myelopathy   Spinal stenosis, thoracic  Assessment and Plan:  Unable to ambulate: likely secondary thoracic stenosis & thoracic myelopathy. Poor surgical candidate as per neuro surg. PT/OT recs SNF. Pt and pt's family are agreeable   Cellulitis: of b/l LE. Continue on IV rocephin    Acute on chronic systolic CHF: continue on IV lasix. Monitor I/Os  Likely pneumonia: as per repeat CXR. Continue on IV rocephin, azithromycin, bronchodilators & encourage incentive spirometyr   Hypertensive urgency: urgency resolved but still w/ HTN. Continue on metoprolol, losartan but hold for MAP < 65    Parkinson's disease: continue on home dose of sinemet, entacapone, amantadine, pramipexole. Palliative care following and recs apprec   Hypokalemia: WNL today    DM2: well controlled, HbA1c 6.1. Continue on SSI w/ accuchecks  Peripheral neuropathy: continue on home dose of gabapentin    GERD: continue on PPI    Gout: continue on home dose of allopurinol       DVT prophylaxis: lovenox  Code Status: full  Family Communication:   Disposition Plan: likely d/c to SNF   Level of care: Med-Surg  Status is: Inpatient Remains inpatient appropriate because: severity of illness, requiring IV abxs & IV lasix     Consultants:  Neuro surg   Procedures:   Antimicrobials: rocephin, azithromycin   Subjective: Pt c/o malaise.   Objective: Vitals:   02/20/23 0938 02/20/23 1530 02/20/23 2129 02/20/23 2308  BP: 132/73 125/60 (!) 151/57 (!) 107/58   Pulse: 70 64 63 66  Resp: 20 16 18 18   Temp: 98.2 F (36.8 C) (!) 97.5 F (36.4 C) 98.7 F (37.1 C) 98.1 F (36.7 C)  TempSrc:      SpO2: (!) 88% 94% 95% 90%  Weight:      Height:        Intake/Output Summary (Last 24 hours) at 02/21/2023 0812 Last data filed at 02/21/2023 0452 Gross per 24 hour  Intake 364.69 ml  Output 900 ml  Net -535.31 ml   Filed Weights   02/17/23 0626  Weight: 61.3 kg    Examination:  General exam: Appears uncomfortable. Appears older than stated age Respiratory system: course breath sounds b/l. Rales b/l  Cardiovascular system: S1&S2+. No rubs or clicks   Gastrointestinal system: abd is soft, NT, ND & hypoactive bowel sounds  Central nervous system: Alert and oriented Psychiatry: judgement and insight appears at baseline. Flat mood and affect     Data Reviewed: I have personally reviewed following labs and imaging studies  CBC: Recent Labs  Lab 02/16/23 1850 02/17/23 0440 02/19/23 0842 02/20/23 0826 02/21/23 0450  WBC 7.5 8.3 12.4* 18.5* 13.8*  NEUTROABS 6.7  --   --   --   --   HGB 12.2 12.5 11.6* 12.5 12.0  HCT 37.1 38.6 36.3 38.9 38.9  MCV 95.1 93.9 96.8 97.0 100.5*  PLT 294 302 248 250 232   Basic Metabolic Panel: Recent Labs  Lab 02/17/23 0440 02/17/23 1540 02/19/23 0842 02/20/23 0826 02/21/23 0450  NA 140 141  140 136 138  K 2.8* 3.1* 4.2 4.6 4.4  CL 103 101 101 97* 94*  CO2 28 31 32 30 32  GLUCOSE 92 194* 114* 125* 102*  BUN 34* 28* 21 29* 35*  CREATININE 0.68 0.73 0.58 0.78 0.94  CALCIUM 8.5* 8.4* 8.8* 9.0 8.8*  MG 2.1  --   --   --   --    GFR: Estimated Creatinine Clearance: 41.6 mL/min (by C-G formula based on SCr of 0.94 mg/dL). Liver Function Tests: Recent Labs  Lab 02/16/23 1850  AST 23  ALT <5  ALKPHOS 91  BILITOT 1.1  PROT 6.4*  ALBUMIN 3.3*   No results for input(s): "LIPASE", "AMYLASE" in the last 168 hours. No results for input(s): "AMMONIA" in the last 168 hours. Coagulation Profile: No  results for input(s): "INR", "PROTIME" in the last 168 hours. Cardiac Enzymes: No results for input(s): "CKTOTAL", "CKMB", "CKMBINDEX", "TROPONINI" in the last 168 hours. BNP (last 3 results) No results for input(s): "PROBNP" in the last 8760 hours. HbA1C: No results for input(s): "HGBA1C" in the last 72 hours.  CBG: Recent Labs  Lab 02/19/23 2128 02/20/23 0757 02/20/23 1130 02/20/23 2120 02/21/23 0753  GLUCAP 145* 121* 163* 151* 136*   Lipid Profile: No results for input(s): "CHOL", "HDL", "LDLCALC", "TRIG", "CHOLHDL", "LDLDIRECT" in the last 72 hours. Thyroid Function Tests: No results for input(s): "TSH", "T4TOTAL", "FREET4", "T3FREE", "THYROIDAB" in the last 72 hours. Anemia Panel: No results for input(s): "VITAMINB12", "FOLATE", "FERRITIN", "TIBC", "IRON", "RETICCTPCT" in the last 72 hours. Sepsis Labs: No results for input(s): "PROCALCITON", "LATICACIDVEN" in the last 168 hours.  No results found for this or any previous visit (from the past 240 hour(s)).       Radiology Studies: DG Chest Port 1 View  Result Date: 02/19/2023 CLINICAL DATA:  Shortness of breath EXAM: PORTABLE CHEST 1 VIEW COMPARISON:  02/16/2023 FINDINGS: Heart borderline in size. Mediastinal contours within normal limits. Aortic atherosclerosis. Consolidation in the lower lobes concerning for pneumonia, new since prior study. No visible significant effusions or pneumothorax. No acute bony abnormality. IMPRESSION: New bilateral lower lobe consolidation, right worse than left concerning for pneumonia Electronically Signed   By: Charlett Nose M.D.   On: 02/19/2023 17:05        Scheduled Meds:  acetaminophen  1,000 mg Oral Once   allopurinol  100 mg Oral Daily   amantadine  100 mg Oral BID   aspirin EC  81 mg Oral Daily   busPIRone  10 mg Oral BID   carbidopa-levodopa  1 tablet Oral QHS   carbidopa-levodopa  2 tablet Oral TID WC & HS   Chlorhexidine Gluconate Cloth  6 each Topical Daily    diclofenac Sodium  4 g Topical QID   enoxaparin (LOVENOX) injection  40 mg Subcutaneous Q24H   entacapone  100 mg Oral TID   furosemide  40 mg Intravenous Q12H   gabapentin  300 mg Oral QHS   insulin aspart  0-5 Units Subcutaneous QHS   insulin aspart  0-9 Units Subcutaneous TID WC   levothyroxine  75 mcg Oral Q0600   LORazepam  0.5 mg Intravenous Once   losartan  100 mg Oral Daily   multivitamin with minerals  1 tablet Oral Daily   pantoprazole  40 mg Oral Daily   polyethylene glycol  17 g Oral Daily   pramipexole  0.125 mg Oral TID AC & HS   pravastatin  40 mg Oral QHS   QUEtiapine  100  mg Oral QHS   sertraline  25 mg Oral Daily   Continuous Infusions:  sodium chloride Stopped (02/19/23 1217)   azithromycin 500 mg (02/20/23 1235)   cefTRIAXone (ROCEPHIN)  IV 1 g (02/20/23 1139)     LOS: 4 days    Time spent: 25 mins    Charise Killian, MD [facility] Pager [phone]  If 7PM-7AM, please contact night-coverage [email or web address] 02/21/2023, 8:12 AM

## 2023-02-22 DIAGNOSIS — M25551 Pain in right hip: Secondary | ICD-10-CM | POA: Diagnosis not present

## 2023-02-22 DIAGNOSIS — R339 Retention of urine, unspecified: Secondary | ICD-10-CM | POA: Diagnosis not present

## 2023-02-22 DIAGNOSIS — G20B2 Parkinson's disease with dyskinesia, with fluctuations: Secondary | ICD-10-CM | POA: Diagnosis not present

## 2023-02-22 DIAGNOSIS — M5137 Other intervertebral disc degeneration, lumbosacral region: Secondary | ICD-10-CM | POA: Diagnosis not present

## 2023-02-22 DIAGNOSIS — R262 Difficulty in walking, not elsewhere classified: Secondary | ICD-10-CM | POA: Diagnosis not present

## 2023-02-22 DIAGNOSIS — J189 Pneumonia, unspecified organism: Secondary | ICD-10-CM | POA: Diagnosis not present

## 2023-02-22 LAB — CBC
HCT: 32.4 % — ABNORMAL LOW (ref 36.0–46.0)
Hemoglobin: 10.3 g/dL — ABNORMAL LOW (ref 12.0–15.0)
MCH: 31.2 pg (ref 26.0–34.0)
MCHC: 31.8 g/dL (ref 30.0–36.0)
MCV: 98.2 fL (ref 80.0–100.0)
Platelets: 266 10*3/uL (ref 150–400)
RBC: 3.3 MIL/uL — ABNORMAL LOW (ref 3.87–5.11)
RDW: 15.3 % (ref 11.5–15.5)
WBC: 14.3 10*3/uL — ABNORMAL HIGH (ref 4.0–10.5)
nRBC: 0 % (ref 0.0–0.2)

## 2023-02-22 LAB — GLUCOSE, CAPILLARY
Glucose-Capillary: 120 mg/dL — ABNORMAL HIGH (ref 70–99)
Glucose-Capillary: 201 mg/dL — ABNORMAL HIGH (ref 70–99)
Glucose-Capillary: 156 mg/dL — ABNORMAL HIGH (ref 70–99)
Glucose-Capillary: 163 mg/dL — ABNORMAL HIGH (ref 70–99)

## 2023-02-22 LAB — BASIC METABOLIC PANEL
Anion gap: 7 (ref 5–15)
BUN: 52 mg/dL — ABNORMAL HIGH (ref 8–23)
CO2: 34 mmol/L — ABNORMAL HIGH (ref 22–32)
Calcium: 8.6 mg/dL — ABNORMAL LOW (ref 8.9–10.3)
Chloride: 94 mmol/L — ABNORMAL LOW (ref 98–111)
Creatinine, Ser: 1.27 mg/dL — ABNORMAL HIGH (ref 0.44–1.00)
GFR, Estimated: 44 mL/min — ABNORMAL LOW (ref >60)
Glucose, Bld: 105 mg/dL — ABNORMAL HIGH (ref 70–99)
Potassium: 4.3 mmol/L (ref 3.5–5.1)
Sodium: 135 mmol/L (ref 135–145)

## 2023-02-22 MED ORDER — FUROSEMIDE 10 MG/ML IJ SOLN
40.0000 mg | Freq: Every day | INTRAMUSCULAR | Status: DC
  Administered 2023-02-22 – 2023-02-24 (×3): 40 mg via INTRAVENOUS
  Filled 2023-02-22 (×3): qty 4

## 2023-02-22 MED ORDER — AMANTADINE HCL 100 MG PO CAPS
100.0000 mg | ORAL_CAPSULE | Freq: Every day | ORAL | Status: DC
  Administered 2023-02-23 – 2023-02-24 (×2): 100 mg via ORAL
  Filled 2023-02-22 (×2): qty 1

## 2023-02-22 MED ORDER — DOCUSATE SODIUM 100 MG PO CAPS
200.0000 mg | ORAL_CAPSULE | Freq: Two times a day (BID) | ORAL | Status: DC
  Administered 2023-02-22 – 2023-02-24 (×5): 200 mg via ORAL
  Filled 2023-02-22 (×5): qty 2

## 2023-02-22 NOTE — Consult Note (Signed)
   Heart Failure Nurse Navigator Note  HFrEF 30 to 35%.  Indeterminate diastolic dysfunction.  Mild biatrial enlargement.  Mild mitral regurgitation.  Mild to moderate tricuspid regurgitation.  Mild aortic insufficiency.  She presented to the emergency room with complaints of hip pain and being unable to ambulate.  BNP was 261 chest x-ray revealed pulmonary vascular congestion with mild interstitial edema.  Comorbidities:  Diabetes GERD Hypertension Hyperlipidemia Parkinson's Obstructive sleep apnea Osteoarthritis Anemia Hypothyroidism  Medications:   Aspirin 81 mg daily Furosemide 40 mg IV daily Levothyroxine 75 mcg daily Losartan 100 mg daily Pravastatin 40 mg daily  Torsemide 20 mg daily as needed  Labs:  Sodium 135, potassium 4.3, chloride 94, CO2 34, BUN 52 yesterday was 35, creatinine 1.27 yesterday was 0.94, estimated GFR 44 yesterday was greater than 60. Weight not documented Intake 830 mL Output 550 mL   Initial meeting with patient on this admission.  She is lying in bed, no acute distress noted.  She remains on 02 per nasal cannula.  There were no family members present.  She states she continues to live with her oldest daughter who continues to work.  Patient voices she feels that she eats a low sodium diet and adheres to the fluid restriction of 64 ounces daily.   She had been weighing herself but the last week before admission had not due to the difficulty with walking.  She also states she had not been able to come to the heart failure clinic appointments due to difficulty with transportation with her daughter working.  She goes on to states she also feels she has become to much for her daughter to take care of and is thinking about a facility -rehab etc.  She had no further questions.  Tresa Endo RN [facility]

## 2023-02-22 NOTE — Plan of Care (Signed)

## 2023-02-22 NOTE — Progress Notes (Signed)
Palliative Care Progress Note, Assessment & Plan   Patient Name: Stephanie [name]       Date: 02/22/2023 DOB: 1946-02-09  Age: 77 y.o. MRN#: [id number] Attending Physician: Charise Killian, MD Primary Care Physician: Marguarite Arbour, MD Admit Date: 02/16/2023  Subjective: Patient is sitting up in bed in no apparent distress.  Nasal cannula in place.  Attending Dr. Mayford Knife and RN at bedside.  Patient knowledges my presence and is able to make her wishes known.  She has no acute complaints today.  HPI: 77 y.o. female  with past medical history of CHF, T2DM, HTN, HLD, hypothyroidism, OSA, arthritis and Parkinson's disease admitted from home on 02/16/2023 with worsening right hip pain with associated weakness which originally started causing her pain a few months prior. She reports that until 3 days prior to ED arrival she was able to ambulate with assistance of walker. Reports of increased pain and weakness eventually leading to her inability to ambulate even with assistance and inability to move BLE. Noted erythema and wounds to BLE.   Imaging negative for cauda equina syndrome   CT T-spine 1. Multilevel severe degenerative endplate changes in the lower cervical spine, at T1-2, T3-4, and from T9-10 through L1-2. 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 2. Facet arthropathy and gouty tophi contribute to severe spinal canal stenosis and severe bilateral neural foraminal narrowing at T10-11, as well as moderate spinal canal stenosis and severe bilateral neural foraminal narrowing at T11-12. 3. Aortic Atherosclerosis (ICD10-I70.0).   MRI  T-spine Erosive spondyloarthropathy, most notable at T10-11 and T11-12 with abnormal paraspinal and epidural soft tissue which is partially calcified and favored to reflect gout as described on CT. Severe spinal stenosis at T10-11 and T11-12 with mild spinal cord signal abnormality/edema.   Palliative medicine was consulted for assisting with goals of care conversations.  Summary of counseling/coordination of care: After reviewing the patient's chart and assessing the patient at bedside, I discussed plan and goals of care with patient.  Patient endorses she continues to want to get better.  She states that God is in control and that he will have the final say.  I reviewed her wishes as previously stated to other PMT providers.  Patient wants to remain a full code.  She is accepting of all offered, available, and appropriate medical interventions to sustain her life.  Symptoms assessed.  Patient denies pain, SOB, or respiratory issues at this time.    She endorses mild constipation.  Abdomen is not distended or tender.   Patient has had normal/formed bowel movements during this hospitalization.  MiraLAX is at bedside but she is weary of taking it as she does not want to give her diarrhea.  Stool softener recommended by Dr. Mayford Knife at bedside.  I am in agreement.  Bowel regimen needed since patient is at high risk for constipation due to immobility.  No adjustments to [facility] needed.  PMT will continue to follow the patient and support her throughout her hospitalization.  We will monitor her peripherally/intermittently.  Please reach out to PMT if goals  change, if patient/family requests, or if patient's health deteriorates during hospitalization.  Physical Exam Constitutional:      General: She is not in acute distress.    Appearance: She is not ill-appearing.  HENT:     Head: Normocephalic.     Mouth/Throat:     Mouth: Mucous membranes are moist.  Eyes:     Pupils: Pupils are equal,  round, and reactive to light.  Cardiovascular:     Rate and Rhythm: Normal rate.  Pulmonary:     Effort: Pulmonary effort is normal.  Skin:    General: Skin is warm and dry.  Neurological:     Mental Status: She is alert and oriented to person, place, and time.  Psychiatric:        Mood and Affect: Mood normal.        Behavior: Behavior normal.        Thought Content: Thought content normal.        Judgment: Judgment normal.             Total Time 35 minutes   Kalene Cutler L. Manon Hilding, FNP-BC Palliative Medicine Team Team Phone # [phone]

## 2023-02-22 NOTE — Plan of Care (Signed)
  Problem: Education: Goal: Ability to describe self-care measures that may prevent or decrease complications (Diabetes Survival Skills Education) will improve Outcome: Progressing   Problem: Coping: Goal: Ability to adjust to condition or change in health will improve Outcome: Progressing   Problem: Fluid Volume: Goal: Ability to maintain a balanced intake and output will improve Outcome: Progressing   Problem: Metabolic: Goal: Ability to maintain appropriate glucose levels will improve Outcome: Progressing   Problem: Nutritional: Goal: Maintenance of adequate nutrition will improve Outcome: Progressing Goal: Progress toward achieving an optimal weight will improve Outcome: Progressing   

## 2023-02-22 NOTE — Progress Notes (Signed)
PROGRESS NOTE   HPI was taken from Dr. Arville Care: Stephanie [name] is a 77 y.o. female with medical history significant for CHF, type diabetes mellitus, GERD, hypertension, dyslipidemia, hypothyroidism, Parkinson's disease, OSA and osteoarthritis, who presented to the emergency room with acute onset of right hip pain which has been worsening over the last couple months per EMS.  Until 3 days ago the patient was able to use her walker to get around in her house and to go to the bathroom by himself.  She did not have any falls or injuries.  She became unable to walk or move her legs with associated right hip and back pain.  She was seen by her PCP and was given ibuprofen and Percocet which initially helped but is not currently.  No fever or chills.  No nausea or vomiting or abdominal pain.  No chest pain or palpitations.  She denies any headache or dizziness or blurred vision.  She has been having some problems with swallowing.  No focal muscle weakness.  No worsening erythema of the lower extremities or pain.  No reported dyspnea, orthopnea or paroxysmal nocturnal dyspnea or worsening lower extremity edema.   ED Course: When she came to the ER, BP was 162/78 with otherwise normal vital signs.  Labs revealed UA with 30 protein and was otherwise unremarkable.  CBC showed no abnormalities.  CMP revealed hypokalemia of 2.8 and a BUN of 48 with creatinine 1.03, calcium 8.4 and albumin 3.3 with total protein 6.4.  High sensitive troponin I was 19 and later 21. EKG as reviewed by me : EKG showed sinus rhythm with a rate of 65 with PACs and left bundle branch block. Imaging: Portable chest ray showed cardiomegaly and pulm vascular congestion with mild interstitial pulmonary edema.   The patient was given 0.5 mg of IV Ativan, 500 mill IV normal saline bolus, 10 mill Cabbell on IV potassium chloride, 50 mcg of IV fentanyl and 1 g of p.o. Tylenol.  She will be admitted to a medical observation bed for further  evaluation and management.   As per Dr. Mayford Knife 4/19-4/23/24: Pt presented w/ inability to walk which is likely secondary to thoracic stenosis & thoracic myelopathy. Pt is poor surgical candidate as per neuro surg. Palliative care was recommended and they have been following the pt. See their notes for more information. Of note, pt was found to have possible cellulitis and has been treated w/ IV abxs. Also, pt was found to have pneumonia and abxs were broaden to IV rocephin, azithromycin. Pt's respiratory status has improved today. PT/OT recs SNF. CM is working on SNF placement    Stephanie [name]  NFA:[id number] DOB: 1946-06-19 DOA: 02/16/2023 PCP: Marguarite Arbour, MD   Assessment & Plan:   Principal Problem:   Unable to ambulate Active Problems:   Acute on chronic HFrEF (heart failure with reduced ejection fraction)   Parkinson's disease   Hypertensive urgency   Type 2 diabetes mellitus with peripheral neuropathy   Hypokalemia   Gout   GERD without esophagitis   Thoracic spondylosis with myelopathy   Spinal stenosis, thoracic  Assessment and Plan:  Unable to ambulate: likely secondary thoracic stenosis & thoracic myelopathy. Poor surgical candidate as per neuro surg. PT/OT recs SNF   Cellulitis: of b/l LE. Continue on IV abxs     Acute on chronic systolic CHF: decrease IV lasix back to  daily. Monitor I/Os. Improved respiratory status today   Likely pneumonia: as per repeat CXR. Continue  on IV ceftriaxone, azithromycin, bronchodilators & encourage incentive spirometry   Hypertensive urgency: urgency resolved but still w/ HTN. Continue on metoprolol, losartan but hold for MAP < 65    Parkinson's disease: continue on home dose of sinemet, entacapone, amantadine, pramipexole. Palliative care following and recs apprec   Hypokalemia:  WNL today  DM2: HbA1c 6.1, well controlled. Continue on SSI w/ accuchecks   Peripheral neuropathy: continue on home dose of gabapentin     GERD: continue on PPI    Gout: continue on home dose of allopurinol       DVT prophylaxis: lovenox  Code Status: full  Family Communication:   Disposition Plan: likely d/c to SNF   Level of care: Med-Surg  Status is: Inpatient Remains inpatient appropriate because: severity of illness, requiring IV abxs & IV lasix     Consultants:  Neuro surg   Procedures:   Antimicrobials: rocephin, azithromycin   Subjective: Pt c/o fatigue   Objective: Vitals:   02/21/23 0924 02/21/23 1355 02/21/23 1637 02/21/23 2116  BP: (!) 98/59 (!) 104/47 (!) 122/50 124/78  Pulse: 65 68 80 65  Resp: 18 20  18   Temp:  97.9 F (36.6 C) 98.2 F (36.8 C) 98.3 F (36.8 C)  TempSrc:      SpO2: (!) 89% 92% 97% 93%  Weight:      Height:        Intake/Output Summary (Last 24 hours) at 02/22/2023 0816 Last data filed at 02/22/2023 0735 Gross per 24 hour  Intake 830 ml  Output 800 ml  Net 30 ml   Filed Weights   02/17/23 0626  Weight: 61.3 kg    Examination:  General exam: Appears calm & comfortable  Respiratory system: diminished breath sounds b/l  Cardiovascular system: S1/S2+. No gallops or clicks    Gastrointestinal system: abd is soft, NT, ND & hypoactive bowel sounds  Central nervous system: alert and oriented  Psychiatry: judgement and insight appears at baseline. Flat mood and affect     Data Reviewed: I have personally reviewed following labs and imaging studies  CBC: Recent Labs  Lab 02/16/23 1850 02/17/23 0440 02/19/23 0842 02/20/23 0826 02/21/23 0450 02/22/23 0608  WBC 7.5 8.3 12.4* 18.5* 13.8* 14.3*  NEUTROABS 6.7  --   --   --   --   --   HGB 12.2 12.5 11.6* 12.5 12.0 10.3*  HCT 37.1 38.6 36.3 38.9 38.9 32.4*  MCV 95.1 93.9 96.8 97.0 100.5* 98.2  PLT 294 302 248 250 232 266   Basic Metabolic Panel: Recent Labs  Lab 02/17/23 0440 02/17/23 1540 02/19/23 0842 02/20/23 0826 02/21/23 0450 02/22/23 0608  NA 140 141 140 136 138 135  K 2.8* 3.1* 4.2  4.6 4.4 4.3  CL 103 101 101 97* 94* 94*  CO2 28 31 32 30 32 34*  GLUCOSE 92 194* 114* 125* 102* 105*  BUN 34* 28* 21 29* 35* 52*  CREATININE 0.68 0.73 0.58 0.78 0.94 1.27*  CALCIUM 8.5* 8.4* 8.8* 9.0 8.8* 8.6*  MG 2.1  --   --   --   --   --    GFR: Estimated Creatinine Clearance: 30.8 mL/min (A) (by C-G formula based on SCr of 1.27 mg/dL (H)). Liver Function Tests: Recent Labs  Lab 02/16/23 1850  AST 23  ALT <5  ALKPHOS 91  BILITOT 1.1  PROT 6.4*  ALBUMIN 3.3*   No results for input(s): "LIPASE", "AMYLASE" in the last 168 hours. No results for input(s): "  AMMONIA" in the last 168 hours. Coagulation Profile: No results for input(s): "INR", "PROTIME" in the last 168 hours. Cardiac Enzymes: No results for input(s): "CKTOTAL", "CKMB", "CKMBINDEX", "TROPONINI" in the last 168 hours. BNP (last 3 results) No results for input(s): "PROBNP" in the last 8760 hours. HbA1C: No results for input(s): "HGBA1C" in the last 72 hours.  CBG: Recent Labs  Lab 02/21/23 0753 02/21/23 1207 02/21/23 1636 02/21/23 2146 02/22/23 0743  GLUCAP 136* 276* 96 132* 120*   Lipid Profile: No results for input(s): "CHOL", "HDL", "LDLCALC", "TRIG", "CHOLHDL", "LDLDIRECT" in the last 72 hours. Thyroid Function Tests: No results for input(s): "TSH", "T4TOTAL", "FREET4", "T3FREE", "THYROIDAB" in the last 72 hours. Anemia Panel: No results for input(s): "VITAMINB12", "FOLATE", "FERRITIN", "TIBC", "IRON", "RETICCTPCT" in the last 72 hours. Sepsis Labs: No results for input(s): "PROCALCITON", "LATICACIDVEN" in the last 168 hours.  No results found for this or any previous visit (from the past 240 hour(s)).       Radiology Studies: No results found.      Scheduled Meds:  acetaminophen  1,000 mg Oral Once   allopurinol  100 mg Oral Daily   amantadine  100 mg Oral BID   aspirin EC  81 mg Oral Daily   busPIRone  10 mg Oral BID   carbidopa-levodopa  1 tablet Oral QHS   carbidopa-levodopa  2  tablet Oral TID WC & HS   Chlorhexidine Gluconate Cloth  6 each Topical Daily   diclofenac Sodium  4 g Topical QID   enoxaparin (LOVENOX) injection  40 mg Subcutaneous Q24H   entacapone  100 mg Oral TID   furosemide  40 mg Intravenous Q12H   gabapentin  300 mg Oral QHS   insulin aspart  0-5 Units Subcutaneous QHS   insulin aspart  0-9 Units Subcutaneous TID WC   levothyroxine  75 mcg Oral Q0600   LORazepam  0.5 mg Intravenous Once   losartan  100 mg Oral Daily   multivitamin with minerals  1 tablet Oral Daily   pantoprazole  40 mg Oral Daily   polyethylene glycol  17 g Oral Daily   pramipexole  0.125 mg Oral TID AC & HS   pravastatin  40 mg Oral QHS   QUEtiapine  100 mg Oral QHS   sertraline  25 mg Oral Daily   Continuous Infusions:  sodium chloride Stopped (02/19/23 1217)   azithromycin 500 mg (02/21/23 1000)   cefTRIAXone (ROCEPHIN)  IV 1 g (02/21/23 0944)     LOS: 5 days    Time spent: 25 mins    Charise Killian, MD [facility] Pager [phone]  If 7PM-7AM, please contact night-coverage [email or web address] 02/22/2023, 8:16 AM

## 2023-02-23 ENCOUNTER — Inpatient Hospital Stay: Payer: Medicare Other

## 2023-02-23 DIAGNOSIS — R262 Difficulty in walking, not elsewhere classified: Secondary | ICD-10-CM | POA: Diagnosis not present

## 2023-02-23 LAB — BASIC METABOLIC PANEL
Anion gap: 8 (ref 5–15)
BUN: 53 mg/dL — ABNORMAL HIGH (ref 8–23)
CO2: 34 mmol/L — ABNORMAL HIGH (ref 22–32)
Calcium: 8.5 mg/dL — ABNORMAL LOW (ref 8.9–10.3)
Chloride: 94 mmol/L — ABNORMAL LOW (ref 98–111)
Creatinine, Ser: 1.01 mg/dL — ABNORMAL HIGH (ref 0.44–1.00)
GFR, Estimated: 58 mL/min — ABNORMAL LOW (ref >60)
Glucose, Bld: 117 mg/dL — ABNORMAL HIGH (ref 70–99)
Potassium: 4.3 mmol/L (ref 3.5–5.1)
Sodium: 136 mmol/L (ref 135–145)

## 2023-02-23 LAB — CBC
HCT: 32.1 % — ABNORMAL LOW (ref 36.0–46.0)
Hemoglobin: 10.3 g/dL — ABNORMAL LOW (ref 12.0–15.0)
MCH: 31.7 pg (ref 26.0–34.0)
MCHC: 32.1 g/dL (ref 30.0–36.0)
MCV: 98.8 fL (ref 80.0–100.0)
Platelets: 279 10*3/uL (ref 150–400)
RBC: 3.25 MIL/uL — ABNORMAL LOW (ref 3.87–5.11)
RDW: 15.2 % (ref 11.5–15.5)
WBC: 10.6 10*3/uL — ABNORMAL HIGH (ref 4.0–10.5)
nRBC: 0 % (ref 0.0–0.2)

## 2023-02-23 LAB — GLUCOSE, CAPILLARY
Glucose-Capillary: 98 mg/dL (ref 70–99)
Glucose-Capillary: 167 mg/dL — ABNORMAL HIGH (ref 70–99)
Glucose-Capillary: 205 mg/dL — ABNORMAL HIGH (ref 70–99)
Glucose-Capillary: 146 mg/dL — ABNORMAL HIGH (ref 70–99)

## 2023-02-23 MED ORDER — IPRATROPIUM-ALBUTEROL 0.5-2.5 (3) MG/3ML IN SOLN
3.0000 mL | Freq: Three times a day (TID) | RESPIRATORY_TRACT | Status: DC
  Administered 2023-02-23: 3 mL via RESPIRATORY_TRACT
  Filled 2023-02-23: qty 3

## 2023-02-23 MED ORDER — IPRATROPIUM-ALBUTEROL 0.5-2.5 (3) MG/3ML IN SOLN
3.0000 mL | Freq: Two times a day (BID) | RESPIRATORY_TRACT | Status: DC
  Administered 2023-02-24: 3 mL via RESPIRATORY_TRACT
  Filled 2023-02-23: qty 3

## 2023-02-23 MED ORDER — AZITHROMYCIN 500 MG PO TABS
500.0000 mg | ORAL_TABLET | Freq: Every day | ORAL | Status: AC
  Administered 2023-02-23 – 2023-02-24 (×2): 500 mg via ORAL
  Filled 2023-02-23 (×2): qty 1

## 2023-02-23 MED ORDER — SORBITOL 70 % SOLN
960.0000 mL | TOPICAL_OIL | Freq: Once | ORAL | Status: AC
  Administered 2023-02-23: 960 mL via RECTAL
  Filled 2023-02-23: qty 240

## 2023-02-23 NOTE — Progress Notes (Signed)
                                                     Palliative Care Progress Note   Patient Name: MARCE SCHARTZ       Date: 02/23/2023 DOB: 1946/08/17  Age: 77 y.o. MRN#: [id number] Attending Physician: Darlin Priestly, MD Primary Care Physician: Marguarite Arbour, MD Admit Date: 02/16/2023  Chart reviewed.  No acute palliative needs at this time.  Full code and full scope remained.  ENT will continue to follow and monitor patient peripherally.  Please reengage with PMT if goals change, at patient/family's request, or if patient's health deteriorates during hospitalization.  Thank you for allowing the Palliative Medicine Team to assist in the care of KHRYSTAL JEANMARIE.  Samara Deist L. Manon Hilding, FNP-BC Palliative Medicine Team Team Phone # [phone]  No charge

## 2023-02-23 NOTE — Progress Notes (Addendum)
Occupational Therapy Treatment Patient Details Name: Stephanie [name] MRN: [id number] DOB: January 23, 1946 Today's Date: 02/23/2023   History of present illness Pt is a 77 y.o. female with medical history significant for CHF, type diabetes mellitus, GERD, hypertension, dyslipidemia, hypothyroidism, Parkinson's disease, OSA and osteoarthritis, who presented to the emergency room with acute onset of right hip pain, back pain, and inability to walk.  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   OT comments  Pt received semi-reclined in bed. Appearing very motivated and appreciative of OT visit; willing to work with OT on sitting balance and ADLs at EOB. T/f MAX A+2, with poor sitting balance; continues to have no AROM in BIL LE. See flowsheet below for further details of session. Sat up at EOB for approx 20 minutes; constant support for sitting balance. Left semi-reclined in bed with all needs in reach.  Patient will benefit from continued OT while in acute care.    Recommendations for follow up therapy are one component of a multi-disciplinary discharge planning process, led by the attending physician.  Recommendations may be updated based on patient status, additional functional criteria and insurance authorization.    Assistance Recommended at Discharge Frequent or constant Supervision/Assistance  Patient can return home with the following  Two people to help with walking and/or transfers;A lot of help with bathing/dressing/bathroom;Assistance  with cooking/housework;Direct supervision/assist for financial management;Direct supervision/assist for medications management;Assist for transportation;Help with stairs or ramp for entrance   Equipment Recommendations  Other (comment) (defer to next venue of care)    Recommendations for Other Services      Precautions / Restrictions Precautions Precautions: Fall Restrictions Weight Bearing Restrictions: No       Mobility Bed Mobility Overal bed mobility: Needs Assistance Bed Mobility: Rolling, Supine to Sit, Sit to Supine Rolling: Mod assist   Supine to sit: Max assist, +2 for physical assistance Sit to supine: Max assist, +2 for physical assistance   General bed mobility comments: Near total assist with bed mobility with log roll technique    Transfers                   General transfer comment: Unable/unsafe to attempt     Balance   Sitting-balance support: Bilateral upper extremity supported, Single extremity supported, Feet unsupported Sitting balance-Leahy Scale: Poor Sitting balance - Comments: Frequent min to mod A for stability during static sitting at the EOB; increased support required while pt using BIL UE dynamically. Postural control: Right lateral lean, Posterior lean     Standing balance comment: unable to stand                           ADL either performed or assessed with clinical judgement   ADL Overall ADL's : Needs assistance/impaired Eating/Feeding: Set up   Grooming: Wash/dry hands;Wash/dry face;Brushing hair;Sitting;Maximal assistance Grooming Details (indicate cue type and reason): MAX A required due to pt sitting balance poor, especially with BIL UE activity such as wiping hands  General ADL Comments: Pt self-feeding juice with straw when OT arrived. Pt requiring back support for use of UE functionally. Continues to have functional use of UE, but weak.    Extremity/Trunk  Assessment Upper Extremity Assessment Upper Extremity Assessment: Generalized weakness   Lower Extremity Assessment Lower Extremity Assessment: LLE deficits/detail;RLE deficits/detail (no active motion noted in BIL LE today)        Vision       Perception     Praxis      Cognition Arousal/Alertness: Awake/alert Behavior During Therapy: WFL for tasks assessed/performed Overall Cognitive Status: Within Functional Limits for tasks assessed                                 General Comments: Pleasant, motivated        Exercises      Shoulder Instructions       General Comments Pt on 4.5L O2 throughout session. 89% saturation at rest. 84% with seated and activity; poor reading at times. Pt stating she feels slightly short of breath. OT provided pt with crayons and coloring pages at end of session, as she stated that she enjoys art at home; encouraged pt to use BIL UE functionally for artistic expression, and for family to bring in her art supplies.    Pertinent Vitals/ Pain       Pain Assessment Pain Assessment: 0-10 Pain Score: 9  Pain Location: R hip Pain Descriptors / Indicators: Sore, Aching Pain Intervention(s): Premedicated before session, Repositioned, Monitored during session  Home Living                                          Prior Functioning/Environment              Frequency  Min 1X/week        Progress Toward Goals  OT Goals(current goals can now be found in the care plan section)  Progress towards OT goals: Progressing toward goals  Acute Rehab OT Goals Patient Stated Goal: Get better OT Goal Formulation: With patient/family Time For Goal Achievement: 03/05/23 Potential to Achieve Goals: Poor ADL Goals Pt Will Perform Upper Body Dressing: with modified independence;sitting Pt Will Perform Lower Body Dressing: with modified independence;bed level Pt Will Transfer to Toilet: with min assist;bedside  commode;with transfer board Pt Will Perform Toileting - Clothing Manipulation and hygiene: with min assist;sitting/lateral leans  Plan Discharge plan remains appropriate;Frequency remains appropriate    Co-evaluation    PT/OT/SLP Co-Evaluation/Treatment: Yes Reason for Co-Treatment: For patient/therapist safety;Complexity of the patient's impairments (multi-system involvement) PT goals addressed during session: Mobility/safety with mobility;Balance;Strengthening/ROM OT goals addressed during session: ADL's and self-care;Strengthening/ROM      AM-PAC OT "6 Clicks" Daily Activity     Outcome Measure   Help from another person eating meals?: A Little Help from another person taking care of personal grooming?: A Little Help from another person toileting, which includes using toliet, bedpan, or urinal?: Total Help from another person bathing (including washing, rinsing, drying)?: Total Help from another person to put on and taking off regular upper body clothing?: A Lot Help from another person to put on and taking off regular lower body clothing?: Total 6 Click Score: 11    End of Session    OT Visit Diagnosis: Muscle weakness (generalized) (M62.81)   Activity Tolerance Patient  tolerated treatment well   Patient Left in bed;with call bell/phone within reach;with bed alarm set (bed in near chair position)   Nurse Communication Mobility status        Time: 6606-3016 OT Time Calculation (min): 35 min  Charges: OT General Charges $OT Visit: 1 Visit OT Treatments $Self Care/Home Management : 8-22 mins  Linward Foster, MS, OTR/L   Alvester Morin 02/23/2023, 12:04 PM

## 2023-02-23 NOTE — TOC Progression Note (Addendum)
Transition of Care [facility]) - Progression Note    Patient Details  Name: Stephanie [name] MRN: [id number] Date of Birth: 1946-07-21  [facility] CM/SW Contact  Marlowe Sax, RN Phone Number: 02/23/2023, 4:21 PM  Clinical Narrative:    Spoke to the patient to review the bed offers, She asked me to call her Daughter [name], I called [name] and reviewed the bed offers, she stated that she had called [facility] on admission and asked them to review for a bed due to the patient's sister being at [facility] I called and sent a secure message to [place] at [facility] in the system it shows that they are considering., Awaiting a response  [name] called me back and asked me to go ahead and accept the bed at [facility] so not to lose the bed  Expected Discharge Plan: Skilled Nursing Facility Barriers to Discharge: SNF Pending bed offer, Continued Medical Work up  Expected Discharge Plan and Services   Discharge Planning Services: CM Consult   Living arrangements for the past 2 months: Single Family Home                 DME Arranged: N/A DME Agency: NA       HH Arranged: NA           Social Determinants of Health (SDOH) Interventions SDOH Screenings   Food Insecurity: No Food Insecurity (02/17/2023)  Housing: Low Risk  (02/17/2023)  Transportation Needs: No Transportation Needs (02/17/2023)  Utilities: Not At Risk (02/17/2023)  Tobacco Use: Low Risk  (02/17/2023)    Readmission Risk Interventions    02/23/2023   10:43 AM 09/03/2021    4:31 PM  Readmission Risk Prevention Plan  Transportation Screening Complete Complete  PCP or Specialist Appt within 3-5 Days  Complete  HRI or Home Care Consult  Complete  Social Work Consult for Recovery Care Planning/Counseling  Complete  Palliative Care Screening  Not Applicable  Medication Review [facility]) Referral to Pharmacy Complete  PCP or Specialist appointment within 3-5 days of discharge Complete    HRI or Home Care Consult Complete   Palliative Care Screening Not Applicable   Skilled Nursing Facility Complete

## 2023-02-23 NOTE — Progress Notes (Signed)
Physical Therapy Treatment Patient Details Name: Stephanie [name] MRN: [id number] DOB: 04/28/1946 Today's Date: 02/23/2023   History of Present Illness Pt is a 77 y.o. female with medical history significant for CHF, type diabetes mellitus, GERD, hypertension, dyslipidemia, hypothyroidism, Parkinson's disease, OSA and osteoarthritis, who presented to the emergency room with acute onset of right hip pain, back pain, and inability to walk.  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    PT Comments    Pt was pleasant and motivated to participate during the session and put forth good effort throughout. Pt required extensive +2 assist during log roll training and continued to present with no noted volitional movement to BLEs in supine or open chain sitting at the EOB.  Pt presented with grossly improved sitting balance at the EOB compared to prior sessions but continued to require very close SBA/CGA in sitting and often required physical assist to prevent LOB.  Pt found on 4.5LO2/min with SpO2 in the upper 80s at rest and mid 80s during seated activity, nursing notified.  Pt will benefit from continued PT services upon discharge to safely address deficits listed in patient problem list for decreased caregiver assistance and eventual return to PLOF.     Recommendations for follow up therapy are one component of a multi-disciplinary discharge planning process, led by the attending physician.  Recommendations may be updated based on patient status,  additional functional criteria and insurance authorization.  Follow Up Recommendations  Can patient physically be transported by private vehicle: No    Assistance Recommended at Discharge Frequent or constant Supervision/Assistance  Patient can return home with the following Two people to help with walking and/or transfers;A lot of help with bathing/dressing/bathroom;Assistance with cooking/housework;Assistance with feeding;Direct supervision/assist for medications management;Assist for transportation;Help with stairs or ramp for entrance   Equipment Recommendations  None recommended by PT    Recommendations for Other Services       Precautions / Restrictions Precautions Precautions: Fall Restrictions Weight Bearing Restrictions: No     Mobility  Bed Mobility Overal bed mobility: Needs Assistance Bed Mobility: Rolling, Supine to Sit, Sit to Supine Rolling: Mod assist   Supine to sit: Max assist, +2 for physical assistance Sit to supine: Max assist, +2 for physical assistance   General bed mobility comments: Near total assist with bed mobility with log roll technique    Transfers                   General transfer comment: Unable/unsafe to attempt    Ambulation/Gait                   Stairs             Wheelchair Mobility    Modified Rankin (Stroke Patients Only)       Balance Overall balance assessment: Needs assistance Sitting-balance support: Bilateral upper extremity supported, Single extremity supported, Feet unsupported Sitting balance-Leahy Scale: Poor Sitting balance - Comments: Frequent min to mod A for stability during static and dynamic sitting at the EOB Postural control: Right lateral lean, Posterior lean  Cognition Arousal/Alertness: Awake/alert Behavior During Therapy: WFL for tasks assessed/performed Overall Cognitive Status: Within Functional Limits for tasks assessed                                           Exercises Other Exercises Other Exercises: Log roll training Other Exercises: Static sitting at EOB for improved core strength and static sitting balance Other Exercises: Dynamic sitting balance training with weight shifting in all planes for improved stability and core strength    General Comments        Pertinent Vitals/Pain Pain Assessment Pain Assessment: 0-10 Pain Score: 9  Pain Location: R hip Pain Descriptors / Indicators: Sore, Aching Pain Intervention(s): Repositioned, Premedicated before session, Monitored during session    Home Living                          Prior Function            PT Goals (current goals can now be found in the care plan section) Progress towards PT goals: Progressing toward goals    Frequency    Min 2X/week      PT Plan Current plan remains appropriate    Co-evaluation PT/OT/SLP Co-Evaluation/Treatment: Yes Reason for Co-Treatment: For patient/therapist safety;Complexity of the patient's impairments (multi-system involvement) PT goals addressed during session: Mobility/safety with mobility;Balance;Strengthening/ROM OT goals addressed during session: ADL's and self-care;Strengthening/ROM      AM-PAC PT "6 Clicks" Mobility   Outcome Measure  Help needed turning from your back to your side while in a flat bed without using bedrails?: Total Help needed moving from lying on your back to sitting on the side of a flat bed without using bedrails?: Total Help needed moving to and from a bed to a chair (including a wheelchair)?: Total Help needed standing up from a chair using your arms (e.g., wheelchair or bedside chair)?: Total Help needed to walk in hospital room?: Total Help needed climbing 3-5 steps with a railing? : Total 6 Click Score: 6    End of Session Equipment Utilized During Treatment: Oxygen Activity Tolerance: Patient tolerated treatment well Patient  left: in bed;with call bell/phone within reach;with bed alarm set;Other (comment) (with OT) Nurse Communication: Mobility status;Other (comment) (SpO2 down to mid 80s with seated therex at EOB per above) PT Visit Diagnosis: History of falling (Z91.81);Difficulty in walking, not elsewhere classified (R26.2);Muscle weakness (generalized) (M62.81)     Time: 1324-4010 PT Time Calculation (min) (ACUTE ONLY): 32 min  Charges:  $Therapeutic Activity: 8-22 mins                     D. Scott Henryk Ursin PT, DPT 02/23/23, 10:42 AM

## 2023-02-23 NOTE — Progress Notes (Signed)
PHARMACIST - PHYSICIAN COMMUNICATION DR:   Fran Lowes CONCERNING: Antibiotic IV to Oral Route Change Policy  RECOMMENDATION: This patient is receiving azithromycin by the intravenous route.  Based on criteria approved by the Pharmacy and Therapeutics Committee, the antibiotic(s) is/are being converted to the equivalent oral dose form(s).   DESCRIPTION: These criteria include: Patient being treated for a respiratory tract infection, urinary tract infection, cellulitis or clostridium difficile associated diarrhea if on metronidazole The patient is not neutropenic and does not exhibit a GI malabsorption state The patient is eating (either orally or via tube) and/or has been taking other orally administered medications for a least 24 hours The patient is improving clinically and has a Tmax < 100.5  If you have questions about this conversion, please contact the Pharmacy Department    [phone] )  [facility]   [phone] )  [facility]   [phone] )  [facility]   [phone] )  [facility]   [phone] )  [facility]

## 2023-02-23 NOTE — Progress Notes (Signed)
  PROGRESS NOTE    Stephanie [name]  ZOX:[id number] DOB: Feb 18, 1946 DOA: 02/16/2023 PCP: Marguarite Arbour, MD  [id number]/[id number]-AA  LOS: 6 days   Brief hospital course:   Assessment & Plan: Stephanie [name] is a 77 y.o. female with medical history significant for CHF, type diabetes mellitus, GERD, hypertension, dyslipidemia, hypothyroidism, Parkinson's disease, OSA and osteoarthritis, who presented to the emergency room with acute onset of right hip pain which has been worsening over the last couple months per EMS.  Until 3 days ago the patient was able to use her walker to get around in her house and to go to the bathroom by himself.  She did not have any falls or injuries.  She became unable to walk or move her legs with associated right hip and back pain.  She was seen by her PCP and was given ibuprofen and Percocet which initially helped but is not currently.   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 Poor surgical candidate as per neuro surg. PT/OT recs SNF    Cellulitis: of b/l LE.  --cont ceftriaxone    Acute on chronic systolic CHF:  --cont IV lasix 40 mg daily   Likely pneumonia:  as per repeat CXR.  --cont ceftriaxone and azithromycin for 5 days --DuoNeb scheduled   Chronic hypoxemic respiratory failure on 2L at baseline  Hypertensive urgency: --BP wnl now --cont losartan   Parkinson's disease:  continue on home dose of sinemet, entacapone, amantadine, pramipexole. Palliative care following and recs apprec   Hypokalemia:   --monitor and replete PRN   DM2: HbA1c 6.1, well controlled.  Continue on SSI w/ accuchecks    Peripheral neuropathy:  continue on home dose of gabapentin    GERD:  continue on PPI    Gout:  continue on home dose of allopurinol   Stool impaction --noted by RN today --KUB --enema    DVT prophylaxis: Lovenox SQ Code Status: Full code  Family Communication:  Level of care: Med-Surg Dispo:   The  patient is from: home Anticipated d/c is to: SNF rehab Anticipated d/c date is: 1-2 days   Subjective and Interval History:  Pt reported dyspnea.  Later RN reported stool stuck in her rectum.   Objective: Vitals:   02/23/23 0800 02/23/23 1520 02/23/23 1700 02/23/23 1739  BP: 130/83 120/87    Pulse: 61 62    Resp: 18 18    Temp: 98.7 F (37.1 C) 98.1 F (36.7 C)    TempSrc:      SpO2: 90% 97% 98% 98%  Weight:      Height:        Intake/Output Summary (Last 24 hours) at 02/23/2023 1906 Last data filed at 02/23/2023 1700 Gross per 24 hour  Intake 120 ml  Output 1800 ml  Net -1680 ml   Filed Weights   02/17/23 0626  Weight: 61.3 kg    Examination:   Constitutional: NAD, alert, oriented to person and place, speech difficult to understand HEENT: conjunctivae and lids normal, EOMI CV: No cyanosis.   RESP: normal respiratory effort Extremities: BLE wrapped in gauze Neuro: II - XII grossly intact.   Foley present   Data Reviewed: I have personally reviewed labs and imaging studies  Time spent: 50 minutes  Darlin Priestly, MD [facility] If 7PM-7AM, please contact night-coverage 02/23/2023, 7:06 PM

## 2023-02-24 DIAGNOSIS — R262 Difficulty in walking, not elsewhere classified: Secondary | ICD-10-CM | POA: Diagnosis not present

## 2023-02-24 LAB — CBC
HCT: 33.5 % — ABNORMAL LOW (ref 36.0–46.0)
Hemoglobin: 10.7 g/dL — ABNORMAL LOW (ref 12.0–15.0)
MCH: 31.5 pg (ref 26.0–34.0)
MCHC: 31.9 g/dL (ref 30.0–36.0)
MCV: 98.5 fL (ref 80.0–100.0)
Platelets: 295 10*3/uL (ref 150–400)
RBC: 3.4 MIL/uL — ABNORMAL LOW (ref 3.87–5.11)
RDW: 14.9 % (ref 11.5–15.5)
WBC: 9.6 10*3/uL (ref 4.0–10.5)
nRBC: 0 % (ref 0.0–0.2)

## 2023-02-24 LAB — BASIC METABOLIC PANEL
Anion gap: 10 (ref 5–15)
BUN: 38 mg/dL — ABNORMAL HIGH (ref 8–23)
CO2: 35 mmol/L — ABNORMAL HIGH (ref 22–32)
Calcium: 8.5 mg/dL — ABNORMAL LOW (ref 8.9–10.3)
Chloride: 91 mmol/L — ABNORMAL LOW (ref 98–111)
Creatinine, Ser: 0.83 mg/dL (ref 0.44–1.00)
GFR, Estimated: >60 mL/min (ref >60)
Glucose, Bld: 92 mg/dL (ref 70–99)
Potassium: 4.4 mmol/L (ref 3.5–5.1)
Sodium: 136 mmol/L (ref 135–145)

## 2023-02-24 LAB — MAGNESIUM: Magnesium: 2.5 mg/dL — ABNORMAL HIGH (ref 1.7–2.4)

## 2023-02-24 LAB — GLUCOSE, CAPILLARY
Glucose-Capillary: 77 mg/dL (ref 70–99)
Glucose-Capillary: 209 mg/dL — ABNORMAL HIGH (ref 70–99)
Glucose-Capillary: 127 mg/dL — ABNORMAL HIGH (ref 70–99)

## 2023-02-24 MED ORDER — OXYCODONE-ACETAMINOPHEN 5-325 MG PO TABS: 1.0000 | ORAL_TABLET | Freq: Three times a day (TID) | ORAL | 0 refills | Status: DC | PRN

## 2023-02-24 MED ORDER — COLLAGENASE 250 UNIT/GM EX OINT
TOPICAL_OINTMENT | Freq: Every day | CUTANEOUS | Status: DC
  Filled 2023-02-24: qty 30

## 2023-02-24 MED ORDER — DOCUSATE SODIUM 100 MG PO CAPS: 200.0000 mg | ORAL_CAPSULE | Freq: Two times a day (BID) | ORAL | 0 refills | Status: DC

## 2023-02-24 MED ORDER — COLLAGENASE 250 UNIT/GM EX OINT: TOPICAL_OINTMENT | Freq: Every day | CUTANEOUS | 0 refills | Status: DC

## 2023-02-24 MED ORDER — METOPROLOL SUCCINATE ER 50 MG PO TB24: ORAL_TABLET | ORAL | Status: DC

## 2023-02-24 MED ORDER — TORSEMIDE 20 MG PO TABS: 20.0000 mg | ORAL_TABLET | Freq: Every day | ORAL | Status: DC

## 2023-02-24 NOTE — Plan of Care (Signed)
  Problem: Education: Goal: Ability to describe self-care measures that may prevent or decrease complications (Diabetes Survival Skills Education) will improve Outcome: Progressing   Problem: Coping: Goal: Ability to adjust to condition or change in health will improve Outcome: Progressing   Problem: Metabolic: Goal: Ability to maintain appropriate glucose levels will improve Outcome: Progressing   Problem: Skin Integrity: Goal: Risk for impaired skin integrity will decrease Outcome: Progressing   Problem: Tissue Perfusion: Goal: Adequacy of tissue perfusion will improve Outcome: Progressing   Problem: Health Behavior/Discharge Planning: Goal: Ability to manage health-related needs will improve Outcome: Progressing

## 2023-02-24 NOTE — Progress Notes (Signed)
DISCHARGE NOTE:  Pt discharged with IV out and packet placed in chart. Nurse called [facility] report was given to [employer], Charity fundraiser at facility. Transportation provided via EMS.

## 2023-02-24 NOTE — TOC Progression Note (Signed)
Transition of Care [facility]) - Progression Note    Patient Details  Name: Stephanie [name] MRN: [id number] Date of Birth: 1945-12-17  [facility] CM/SW Contact  Marlowe Sax, RN Phone Number: 02/24/2023, 1:20 PM  Clinical Narrative:     Patient to go to [facility] EMS called and arranged transport,  [name] called and notified  Expected Discharge Plan: Skilled Nursing Facility Barriers to Discharge: SNF Pending bed offer, Continued Medical Work up  Expected Discharge Plan and Services   Discharge Planning Services: CM Consult   Living arrangements for the past 2 months: Single Family Home Expected Discharge Date: 02/24/23               DME Arranged: N/A DME Agency: NA       HH Arranged: NA           Social Determinants of Health (SDOH) Interventions SDOH Screenings   Food Insecurity: No Food Insecurity (02/17/2023)  Housing: Low Risk  (02/17/2023)  Transportation Needs: No Transportation Needs (02/17/2023)  Utilities: Not At Risk (02/17/2023)  Tobacco Use: Low Risk  (02/17/2023)    Readmission Risk Interventions    02/23/2023   10:43 AM 09/03/2021    4:31 PM  Readmission Risk Prevention Plan  Transportation Screening Complete Complete  PCP or Specialist Appt within 3-5 Days  Complete  HRI or Home Care Consult  Complete  Social Work Consult for Recovery Care Planning/Counseling  Complete  Palliative Care Screening  Not Applicable  Medication Review [facility]) Referral to Pharmacy Complete  PCP or Specialist appointment within 3-5 days of discharge Complete   HRI or Home Care Consult Complete   Palliative Care Screening Not Applicable   Skilled Nursing Facility Complete

## 2023-02-24 NOTE — Consult Note (Signed)
WOC Nurse Consult Note: Reason for Consult:Unstageable pressure injury to sacrum Wound type:Pressure Pressure Injury POA: Yes Measurement:Per Nursing Flow Sheet: 5cm x 2.5cm with depth obscured by nonviable tissue (black eschar) Wound bed:See above Drainage (amount, consistency, odor) Scant Periwound: erythema Dressing procedure/placement/frequency: I have provided Nursing with topical care guidance for enzymatic debriding of the nonviable tissue using collagenase (Santyl) applied once daily and topped with a moist dressing. Turning and repositioning is in place and I will add guidance for minimizing time in the supine position. A pressure redistribution chair cushion is also provided for ruse when OOB in the chair and for downstream use. It is to be sent home with the patient.  Recommend continuing care and oversight of this lesion post discharge by the outpatient wound care center or the patient's PCP.  WOC nursing team will not follow, but will remain available to this patient, the nursing and medical teams.  Please re-consult if needed.  Thank you for inviting [place] to participate in this patient's Plan of Care.  Ladona Mow, MSN, RN, CNS, GNP, Leda Min, [employer], [employer] phone:  [phone]

## 2023-02-24 NOTE — Care Management Important Message (Signed)
Important Message  Patient Details  Name: Stephanie [name] MRN: [id number] Date of Birth: 08/16/1946   Medicare Important Message Given:  Yes     Olegario Messier A Anberlyn Feimster 02/24/2023, 11:08 AM

## 2023-02-24 NOTE — Progress Notes (Signed)
PT Cancellation Note  Patient Details Stephanie [name] MRN: [id number] DOB: 26-Aug-1946   Cancelled Treatment:     Pt received in bed on 4L O2 with SpO2 between 90-91% at rest. Pt states she is d/c'ing to [facility] today and currently too fatigued to participate in PT session. Will continue to follow if d/c is delayed.   Jannet Askew 02/24/2023, 11:57 AM

## 2023-02-24 NOTE — Discharge Summary (Signed)
Physician Discharge Summary   Stephanie [name]  female DOB: 1946/06/01  ZOX:[id number]  PCP: Marguarite Arbour, MD  Admit date: 02/16/2023 Discharge date: 02/24/2023  Admitted From: home Disposition:  SNF rehab CODE STATUS: Full code  Discharge Instructions     Discharge wound care:   Complete by: As directed    Wound care to sacral Unstageable PI:  Cleanse with soap and water, rinse and dry.  Protect periwound skin with a liquid skin barrier film wipe (Cavillon) and allow to dry. Apply collagenase (Santyl) in a 1/8 inch layer to wound, top with saline moistened gauze dressing (opened). Cover with dry gauze and secure with a silicone foam dressing for the sacrum. Change daily. May change PRN for soiling.   Apply Xeroform gauze to bilat leg wounds Q day, then cover with ABD pads and wrap with kerlex, beginning just behind toes to below knees in a spiral fashion [facility] Course:  For full details, please see H&P, progress notes, consult notes and ancillary notes.  Briefly,  Stephanie [name] is a 77 y.o. female with medical history significant for sCHF, diabetes mellitus, hypertension, Parkinson's disease, OSA and osteoarthritis, who presented to the emergency room with acute onset of right hip pain which has been worsening over the last couple months per EMS.    Until 3 days ago the patient was able to use her walker to get around in her house and to go to the bathroom by himself.  She did not have any falls or injuries.  She became unable to walk or move her legs with associated right hip and back pain.  She was seen by her PCP and was given ibuprofen and Percocet which initially helped but then not.   Unable to ambulate likely secondary  thoracic stenosis & thoracic myelopathy.  Poor surgical candidate as per neuro surg. PT/OT recs SNF    Cellulitis of b/l LE --completed 5 days of ceftriaxone  --wound care and dressing change per order as above.   Acute on  chronic systolic CHF:  --LVEF 30 to 35% in April 2023. --CXR on presentation showed "Cardiomegaly and pulmonary vascular congestion with mild interstitial edema." --pt has metolazone daily and torsemide PRN on her home med list, but unclear whether pt was taking them. --pt received IV lasix 40 mg BID and then daily for diuresis during hospitalization.  --Pt discharged on home torsemide 20 mg daily.  Will need to check kidney function 1-2 weeks after discharge to ensure stable kidney function after being on scheduled diuretic.   --Home metolazone d/c'ed.  Pneumonia --repeat CXR on 4/20 showed "New bilateral lower lobe consolidation, right worse than left concerning for pneumonia." --pt completed 5 days of ceftriaxone and azithromycin.   Chronic hypoxemic respiratory failure on 2L at baseline   HTN --cont losartan --Home Toprol held due to low HR, pending outpatient PCP followup.   Parkinson's disease:  continue on home dose of sinemet, entacapone, amantadine, pramipexole.   Hypokalemia:   --monitored and repleted PRN --cont home potassium 10 mEq daily when taking torsemide   DM2:  HbA1c 6.1, well controlled.  --resume home metformin after discharge.  Peripheral neuropathy:  continue on home dose of gabapentin    GERD:  continue on PPI    Gout:  continue on home dose of allopurinol    Stool impaction --noted by RN on 4/24.  KUB confirmed "Moderate to large amount of stool is seen in colon and  rectum.  Possibility of fecal impaction in rectum."   --Pt received enema with resolution of stool impaction and subsequent multiple BM's. --cont scheduled colace and miralax after discharge.  Sacral Unstageable pressure ulcer --wound care RN consulted.  Wound care and dressing change per order above.   Unless noted above, medications under "STOP" list are ones pt was not taking PTA.  Discharge Diagnoses:  Principal Problem:   Unable to ambulate Active Problems:   Acute on  chronic HFrEF (heart failure with reduced ejection fraction)   Parkinson's disease   Hypertensive urgency   Type 2 diabetes mellitus with peripheral neuropathy   Hypokalemia   Gout   GERD without esophagitis   Thoracic spondylosis with myelopathy   Spinal stenosis, thoracic   30 Day Unplanned Readmission Risk Score    Flowsheet Row ED to Hosp-Admission (Current) from 02/16/2023 in [facility] (1A)  30 Day Unplanned Readmission Risk Score (%) 38.36 Filed at 02/24/2023 0801       This score is the patient's risk of an unplanned readmission within 30 days of being discharged (0 -100%). The score is based on dignosis, age, lab data, medications, orders, and past utilization.   Low:  0-14.9   Medium: 15-21.9   High: 22-29.9   Extreme: 30 and above         Discharge Instructions:  Allergies as of 02/24/2023   No Known Allergies      Medication List     STOP taking these medications    metolazone 2.5 MG tablet Commonly known as: ZAROXOLYN   sertraline 25 MG tablet Commonly known as: ZOLOFT       TAKE these medications    acetaminophen 500 MG tablet Commonly known as: TYLENOL Take 2 tablets (1,000 mg total) by mouth 3 (three) times daily as needed for mild pain, moderate pain or headache.   allopurinol 100 MG tablet Commonly known as: ZYLOPRIM Take 100 mg by mouth daily.   Amantadine HCl 100 MG tablet Take 100 mg by mouth 2 (two) times daily.   aspirin EC 81 MG tablet Take 1 tablet (81 mg total) by mouth daily.   busPIRone 10 MG tablet Commonly known as: BUSPAR Take 10 mg by mouth 2 (two) times daily.   carbidopa-levodopa 25-100 MG tablet Commonly known as: SINEMET IR Take 2 tablets by mouth 4 (four) times daily.   carbidopa-levodopa 50-200 MG tablet Commonly known as: SINEMET CR Take 1 tablet by mouth at bedtime.   collagenase 250 UNIT/GM ointment Commonly known as: SANTYL Apply topically daily. Start taking on:  February 25, 2023   diclofenac Sodium 1 % Gel Commonly known as: VOLTAREN Apply 4 g topically 4 (four) times daily.   docusate sodium 100 MG capsule Commonly known as: COLACE Take 2 capsules (200 mg total) by mouth 2 (two) times daily.   entacapone 200 MG tablet Commonly known as: COMTAN Take 100 mg by mouth 3 (three) times daily.   fluticasone 50 MCG/ACT nasal spray Commonly known as: FLONASE Place 1-2 sprays into the nose daily as needed for allergies.   FreeStyle Libre 14 Day Sensor Misc by Does not apply route.   gabapentin 300 MG capsule Commonly known as: NEURONTIN Take 300 mg by mouth at bedtime.   glucose 4 GM chewable tablet Chew 1 tablet by mouth as needed for low blood sugar.   levothyroxine 75 MCG tablet Commonly known as: SYNTHROID Take 75 mcg by mouth daily.   losartan 100 MG tablet Commonly  known as: COZAAR Take 1 tablet (100 mg total) by mouth daily.   metFORMIN 1000 MG tablet Commonly known as: GLUCOPHAGE Take 1,000 mg by mouth 2 (two) times daily with a meal.   metoprolol succinate 50 MG 24 hr tablet Commonly known as: TOPROL-XL Hold until followup with PCP due to low heart rate. What changed:  how much to take how to take this when to take this additional instructions   multivitamin with minerals Tabs tablet Take 1 tablet by mouth daily.   oxyCODONE-acetaminophen 5-325 MG tablet Commonly known as: PERCOCET/ROXICET Take 1 tablet by mouth every 8 (eight) hours as needed for severe pain. Home med. What changed:  when to take this reasons to take this additional instructions   pantoprazole 40 MG tablet Commonly known as: PROTONIX Take 40 mg by mouth daily.   polyethylene glycol 17 g packet Commonly known as: MiraLax Take 17 g by mouth daily.   potassium chloride 10 MEQ tablet Commonly known as: KLOR-CON Take 10 mEq by mouth once daily as needed with torsemide.   pramipexole 0.125 MG tablet Commonly known as: MIRAPEX Take 0.125 mg  by mouth 4 (four) times daily.   pravastatin 40 MG tablet Commonly known as: PRAVACHOL Take 40 mg by mouth daily.   QUEtiapine 100 MG tablet Commonly known as: SEROQUEL Take 100 mg by mouth at bedtime.   tiZANidine 4 MG tablet Commonly known as: ZANAFLEX Take 4 mg by mouth 3 (three) times daily.   torsemide 20 MG tablet Commonly known as: DEMADEX Take 1 tablet (20 mg total) by mouth daily. What changed:  when to take this reasons to take this               Discharge Care Instructions  (From admission, onward)           Start     Ordered   02/24/23 0000  Discharge wound care:       Comments: Wound care to sacral Unstageable PI:  Cleanse with soap and water, rinse and dry.  Protect periwound skin with a liquid skin barrier film wipe (Cavillon) and allow to dry. Apply collagenase (Santyl) in a 1/8 inch layer to wound, top with saline moistened gauze dressing (opened). Cover with dry gauze and secure with a silicone foam dressing for the sacrum. Change daily. May change PRN for soiling.   Apply Xeroform gauze to bilat leg wounds Q day, then cover with ABD pads and wrap with kerlex, beginning just behind toes to below knees in a spiral fashion - -   02/24/23 1121             Contact information for follow-up providers     Marguarite Arbour, MD Follow up.   Specialty: Internal Medicine Contact information: [address] [place] [address] [phone]              Contact information for after-discharge care     Destination     [facility] Preferred SNF .   Service: Skilled Nursing Contact information: [address] [place] [address] [phone]                     No Known Allergies   The results of significant diagnostics from this hospitalization (including imaging, microbiology, ancillary and laboratory) are listed below  for reference.   Consultations:   Procedures/Studies: DG Abd 1 View  Result Date: 02/23/2023 CLINICAL  DATA:  Abdominal pain, constipation EXAM: ABDOMEN - 1 VIEW COMPARISON:  None Available. FINDINGS: There is no significant small bowel dilation. Stomach is not distended. Moderate to large amount of stool is seen in colon and rectum. Transverse diameter of rectum measures 8.2 cm. Degenerative changes are noted in lower thoracic spine and lumbar spine. Sclerotic changes are noted in bony structures. Similar finding was seen in the CT done on 12/16/2022. IMPRESSION: Moderate to large amount of stool is seen in colon and rectum. Possibility of fecal impaction in rectum should be considered. There is no significant small bowel dilation. Electronically Signed   By: Ernie Avena M.D.   On: 02/23/2023 19:23   DG Chest Port 1 View  Result Date: 02/19/2023 CLINICAL DATA:  Shortness of breath EXAM: PORTABLE CHEST 1 VIEW COMPARISON:  02/16/2023 FINDINGS: Heart borderline in size. Mediastinal contours within normal limits. Aortic atherosclerosis. Consolidation in the lower lobes concerning for pneumonia, new since prior study. No visible significant effusions or pneumothorax. No acute bony abnormality. IMPRESSION: New bilateral lower lobe consolidation, right worse than left concerning for pneumonia Electronically Signed   By: Charlett Nose M.D.   On: 02/19/2023 17:05   MR THORACIC SPINE WO CONTRAST  Result Date: 02/17/2023 CLINICAL DATA:  Ataxia, nontraumatic, thoracic pathology suspected. EXAM: MRI THORACIC SPINE WITHOUT CONTRAST TECHNIQUE: Multiplanar, multisequence MR imaging of the thoracic spine was performed. No intravenous contrast was administered. COMPARISON:  Thoracic spine CT 02/17/2023. Lumbar spine MRI 02/17/2023. FINDINGS: Alignment: Minimal anterolisthesis of T11 on T12. Grade 1 retrolisthesis of L1 on L2, L2 on L3, and L3 on L4. Vertebrae: Fluid signal in the disc spaces at T10-11 and  T11-12 with associated endplate erosions as shown on today's earlier CT which also demonstrated gas in the T10-11 disc space. Prominent vertebral sclerosis at these levels on CT with only mild marrow edema. Erosive facet arthropathy at these levels as noted on CT with associated posterior element edema, greatest at T10-11 on the right. Additional advanced disc space narrowing with vertebral sclerosis, mild endplate edema, and prominent endplate irregularity at T2-3, T12-L1, and L1-2. Anterior wedging of the T11 vertebral body related to erosions. Cord: Suspected mild T2 hyperintensity in the spinal cord extending from T9-10 to T11-12 associated with severe spinal stenosis. Paraspinal and other soft tissues: Bilateral paraspinal soft tissue edema from T8-T12. Associated soft tissue calcifications as shown on CT, particularly notable posterolaterally on the right at T10-11. Abnormal epidural material in the spinal canal bilaterally at T10-11 and T11-12 which is also calcified by CT. Disc levels: The above described changes involving the discs and facets as well as epidural material at T10-11 and T11-12 result in severe spinal stenosis with moderate to severe cord flattening as well as severe neural foraminal stenosis. Mild spinal stenosis at T12-L1. Moderate bilateral neural foraminal stenosis at T2-3. IMPRESSION: Erosive spondyloarthropathy, most notable at T10-11 and T11-12 with abnormal paraspinal and epidural soft tissue which is partially calcified and favored to reflect gout as described on CT. Severe spinal stenosis at T10-11 and T11-12 with mild spinal cord signal abnormality/edema. Electronically Signed   By: Sebastian Ache M.D.   On: 02/17/2023 14:05   CT THORACIC SPINE WO CONTRAST  Result Date: 02/17/2023 CLINICAL DATA:  Myelopathy, acute, thoracic spine. EXAM: CT THORACIC SPINE WITHOUT CONTRAST TECHNIQUE: Multidetector CT images of the thoracic were obtained using the standard protocol without  intravenous contrast. RADIATION DOSE REDUCTION: This exam was performed according to the departmental dose-optimization program which includes automated exposure control,  adjustment of the mA and/or kV according to patient size and/or use of iterative reconstruction technique. COMPARISON:  MRI lumbar spine 02/17/2023. FINDINGS: Alignment: Grade 1 anterolisthesis of T11 on T12. Vertebrae: Multilevel severe degenerative endplate changes in the lower cervical spine, at T1-2, T3-4, and from T9-10 through L1-2. 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 Paraspinal and other soft tissues: Atherosclerotic calcifications of the aorta and its branches. Linear atelectasis and/or scarring in the lung bases. Disc levels: Facet arthropathy and gouty tophi contribute to severe spinal canal stenosis and severe bilateral neural foraminal narrowing at T10-11, as well as moderate spinal canal stenosis and severe bilateral neural foraminal narrowing at T11-12. IMPRESSION: 1. Multilevel severe degenerative endplate changes in the lower cervical spine, at T1-2, T3-4, and from T9-10 through L1-2. 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 2. Facet arthropathy and gouty tophi contribute to severe spinal canal stenosis and severe bilateral neural foraminal narrowing at T10-11, as well as moderate spinal canal stenosis and severe bilateral neural foraminal narrowing at T11-12. 3. Aortic Atherosclerosis (ICD10-I70.0). Electronically Signed   By: Orvan Falconer M.D.   On: 02/17/2023 10:00   MR LUMBAR SPINE WO CONTRAST  Result Date: 02/17/2023 CLINICAL DATA:  Low back pain EXAM: MRI LUMBAR SPINE WITHOUT CONTRAST TECHNIQUE: Multiplanar,  multisequence MR imaging of the lumbar spine was performed. No intravenous contrast was administered. COMPARISON:  01/27/2016 FINDINGS: Segmentation:  Standard. Alignment: Grade 1 anterolisthesis at T11-12. Grade 1 retrolisthesis at L2-3 and L3-4. Vertebrae: Compression deformity of T11 with superior osteonecrotic cleft. No acute fracture. Conus medullaris and cauda equina: Conus extends to the L2 level. Conus and cauda equina appear normal. Paraspinal and other soft tissues: Negative Disc levels: Axial images are markedly motion degraded. T9-10: Unremarkable. T10-11: Moderate facet hypertrophy with left facet spurring that encroaches on the left dorsal thecal sac. Endplate ridging. Moderate spinal canal stenosis. Severe bilateral foraminal stenosis. T11-12: Disc bulge with superimposed right subarticular protrusion. Mild spinal canal stenosis. Severe bilateral foraminal stenosis. T12-L1: Small central disc protrusion without spinal canal stenosis. L1-L2: Disc space narrowing with small bulge and endplate ridging. No spinal canal stenosis. No neural foraminal stenosis. L2-L3: Small disc bulge with endplate ridging. No spinal canal stenosis. No neural foraminal stenosis. L3-L4: Small disc bulge with mild facet hypertrophy and endplate ridging. No spinal canal stenosis. Moderate bilateral neural foraminal stenosis. L4-L5: Medium-sized disc bulge with moderate facet hypertrophy. Mild spinal canal stenosis. Severe bilateral neural foraminal stenosis. L5-S1: Small disc bulge. No spinal canal stenosis. Moderate bilateral neural foraminal stenosis. Visualized sacrum: Normal. IMPRESSION: 1. Motion degraded examination. Progression of multilevel degenerative disc disease since 01/27/16. 2. Subacute wedge compression fracture of T11. 3. Moderate T10-11 and mild T11-12 spinal canal stenosis with severe bilateral neural foraminal stenosis at both levels. 4. Mild L4-5 spinal canal stenosis and severe bilateral neural foraminal  stenosis. 5. Moderate bilateral L3-4 and L5-S1 neural foraminal stenosis. Electronically Signed   By: Deatra Robinson M.D.   On: 02/17/2023 00:56   CT Hip Right Wo Contrast  Result Date: 02/16/2023 CLINICAL DATA:  Hip pain, stress fracture suspected, neg xray Right hip pain for 2 months. EXAM: CT OF THE RIGHT HIP WITHOUT CONTRAST TECHNIQUE: Multidetector CT imaging of the right hip was performed according to the  standard protocol. Multiplanar CT image reconstructions were also generated. RADIATION DOSE REDUCTION: This exam was performed according to the departmental dose-optimization program which includes automated exposure control, adjustment of the mA and/or kV according to patient size and/or use of iterative reconstruction technique. COMPARISON:  Radiograph earlier today. Included portions from abdominopelvic CT 12/16/2022 FINDINGS: Bones/Joint/Cartilage No fracture. No periostitis to suggest stress reaction on CT. Mild hip degenerative change with acetabular spurring and subchondral cyst. No evidence of a vascular necrosis, erosion or focal bone lesions. No bony destructive change. There may be minimal hip joint effusion. Ligaments Suboptimally assessed by CT. Muscles and Tendons Slightly atrophic but no focal abnormality. Soft tissues There is subcutaneous soft tissue edema. Marked distention of the urinary bladder is partially included. Large volume of stool in the included colon. IMPRESSION: 1. Mild hip degenerative change. No acute findings. 2. Marked distention of the urinary bladder is partially included. Large volume of stool in the included colon. 3. Generalized subcutaneous edema, nonspecific. Electronically Signed   By: Narda Rutherford M.D.   On: 02/16/2023 21:56   DG Chest 1 View  Result Date: 02/16/2023 CLINICAL DATA:  Weakness history of CHF EXAM: CHEST  1 VIEW COMPARISON:  10/08/2022 FINDINGS: Stable cardiomegaly. Aortic atherosclerotic calcification. Pulmonary vascular congestion and  diffuse bilateral interstitial coarsening. Linear opacities in the left mid lung may be due to atelectasis or scarring. No pleural effusion or pneumothorax. No displaced rib fractures. IMPRESSION: 1. Cardiomegaly and pulmonary vascular congestion with mild interstitial edema. Electronically Signed   By: Minerva Fester M.D.   On: 02/16/2023 19:55   DG Hand Complete Left  Result Date: 02/16/2023 CLINICAL DATA:  Pain after trauma. EXAM: LEFT HAND - COMPLETE 3 VIEW COMPARISON:  None Available. FINDINGS: No obvious fracture or dislocation. Preserved joint spaces and bone mineralization. Minimal osteophytes along the interphalangeal joint of the thumb. Evaluation limited as the fingers are flexed on all views in the overlapping on the lateral view. IMPRESSION: Mild degenerative changes. No acute osseous abnormality. Limited views. Electronically Signed   By: Karen Kays M.D.   On: 02/16/2023 18:31   DG Hip Unilat W or Wo Pelvis 2-3 Views Right  Result Date: 02/16/2023 CLINICAL DATA:  Right-sided hip pain for 2 months EXAM: DG HIP (WITH OR WITHOUT PELVIS) 3V RIGHT COMPARISON:  None Available. FINDINGS: Osteopenia. No fracture or dislocation. Slight joint space loss of the right hip. Mild sclerosis along the sacroiliac joints. Degenerative changes seen of the lumbar spine at the edge of the imaging field. There are some rounded densities in the pelvis which are indeterminate although possibly vascular. Note is made of diffuse colonic stool. The areas of bony sclerosis seen on the patient's prior CT scan of the abdomen and pelvis of 12/16/2022 not as well appreciated by this x-ray. Please correlate with specific history and prior workup IMPRESSION: Osteopenia with mild degenerative changes. Significant colonic stool Electronically Signed   By: Karen Kays M.D.   On: 02/16/2023 18:29      Labs: BNP (last 3 results) Recent Labs    10/09/22 0436 02/18/23 0900  BNP 734.9* 261.0*   Basic Metabolic  Panel: Recent Labs  Lab 02/20/23 0826 02/21/23 0450 02/22/23 0608 02/23/23 0523 02/24/23 0514  NA 136 138 135 136 136  K 4.6 4.4 4.3 4.3 4.4  CL 97* 94* 94* 94* 91*  CO2 30 32 34* 34* 35*  GLUCOSE 125* 102* 105* 117* 92  BUN 29* 35* 52* 53* 38*  CREATININE 0.78 0.94 1.27*  1.01* 0.83  CALCIUM 9.0 8.8* 8.6* 8.5* 8.5*  MG  --   --   --   --  2.5*   Liver Function Tests: No results for input(s): "AST", "ALT", "ALKPHOS", "BILITOT", "PROT", "ALBUMIN" in the last 168 hours. No results for input(s): "LIPASE", "AMYLASE" in the last 168 hours. No results for input(s): "AMMONIA" in the last 168 hours. CBC: Recent Labs  Lab 02/20/23 0826 02/21/23 0450 02/22/23 0608 02/23/23 0523 02/24/23 0514  WBC 18.5* 13.8* 14.3* 10.6* 9.6  HGB 12.5 12.0 10.3* 10.3* 10.7*  HCT 38.9 38.9 32.4* 32.1* 33.5*  MCV 97.0 100.5* 98.2 98.8 98.5  PLT 250 232 266 279 295   Cardiac Enzymes: No results for input(s): "CKTOTAL", "CKMB", "CKMBINDEX", "TROPONINI" in the last 168 hours. BNP: Invalid input(s): "POCBNP" CBG: Recent Labs  Lab 02/23/23 1118 02/23/23 1619 02/23/23 2111 02/24/23 0746 02/24/23 1149  GLUCAP 167* 205* 146* 77 209*   D-Dimer No results for input(s): "DDIMER" in the last 72 hours. Hgb A1c No results for input(s): "HGBA1C" in the last 72 hours. Lipid Profile No results for input(s): "CHOL", "HDL", "LDLCALC", "TRIG", "CHOLHDL", "LDLDIRECT" in the last 72 hours. Thyroid function studies No results for input(s): "TSH", "T4TOTAL", "T3FREE", "THYROIDAB" in the last 72 hours.  Invalid input(s): "FREET3" Anemia work up No results for input(s): "VITAMINB12", "FOLATE", "FERRITIN", "TIBC", "IRON", "RETICCTPCT" in the last 72 hours. Urinalysis    Component Value Date/Time   COLORURINE AMBER (A) 02/16/2023 2031   APPEARANCEUR CLEAR (A) 02/16/2023 2031   LABSPEC 1.020 02/16/2023 2031   PHURINE 5.0 02/16/2023 2031   GLUCOSEU NEGATIVE 02/16/2023 2031   HGBUR NEGATIVE 02/16/2023 2031    BILIRUBINUR NEGATIVE 02/16/2023 2031   KETONESUR 5 (A) 02/16/2023 2031   PROTEINUR 30 (A) 02/16/2023 2031   NITRITE NEGATIVE 02/16/2023 2031   LEUKOCYTESUR NEGATIVE 02/16/2023 2031   Sepsis Labs Recent Labs  Lab 02/21/23 0450 02/22/23 0608 02/23/23 0523 02/24/23 0514  WBC 13.8* 14.3* 10.6* 9.6   Microbiology No results found for this or any previous visit (from the past 240 hour(s)).   Total time spend on discharging this patient, including the last patient exam, discussing the hospital stay, instructions for ongoing care as it relates to all pertinent caregivers, as well as preparing the medical discharge records, prescriptions, and/or referrals as applicable, is 35 minutes.    Darlin Priestly, MD  [facility] 02/24/2023, 12:06 PM

## 2023-02-24 NOTE — TOC Progression Note (Signed)
Transition of Care [facility]) - Progression Note    Patient Details  Name: ELLIET GOODNOW MRN: [id number] Date of Birth: [birth year]  Transition of Care [facility]) CM/SW Contact  Marlowe Sax, RN Phone Number: 02/24/2023, 10:05 AM  Clinical Narrative:    Notified  Daughter [facility] offered a bed and they accepted, I notified [facility] that she will not be coming      thereExpected Discharge Plan: Skilled Nursing Facility Barriers to Discharge: SNF Pending bed offer, Continued Medical Work up  Expected Discharge Plan and Services   Discharge Planning Services: CM Consult   Living arrangements for the past 2 months: Single Family Home                 DME Arranged: N/A DME Agency: NA       HH Arranged: NA           Social Determinants of Health (SDOH) Interventions SDOH Screenings   Food Insecurity: No Food Insecurity (02/17/2023)  Housing: Low Risk  (02/17/2023)  Transportation Needs: No Transportation Needs (02/17/2023)  Utilities: Not At Risk (02/17/2023)  Tobacco Use: Low Risk  (02/17/2023)    Readmission Risk Interventions    02/23/2023   10:43 AM 09/03/2021    4:31 PM  Readmission Risk Prevention Plan  Transportation Screening Complete Complete  PCP or Specialist Appt within 3-5 Days  Complete  HRI or Home Care Consult  Complete  Social Work Consult for Recovery Care Planning/Counseling  Complete  Palliative Care Screening  Not Applicable  Medication Review [facility]) Referral to Pharmacy Complete  PCP or Specialist appointment within 3-5 days of discharge Complete   HRI or Home Care Consult Complete   Palliative Care Screening Not Applicable   Skilled Nursing Facility Complete

## 2023-02-24 NOTE — Progress Notes (Deleted)
DISCHARGE NOTE:   Pt discharged with packet in chart and IV removed. Report called to [facility], Diplomatic Services operational officer said that she paged the nurse but nurse was not available at this time.  Vitals obtained and no further questions or concerns. Transportation provided via EMS.

## 2023-03-18 ENCOUNTER — Emergency Department
Admission: EM | Admit: 2023-03-18 | Discharge: 2023-03-19 | Disposition: A | Discharge status: Home / Self Care | Payer: Medicare Other | Attending: Emergency Medicine | Admitting: Emergency Medicine

## 2023-03-18 ENCOUNTER — Other Ambulatory Visit: Payer: Self-pay

## 2023-03-18 DIAGNOSIS — Z5189 Encounter for other specified aftercare: Secondary | ICD-10-CM | POA: Diagnosis present

## 2023-03-18 DIAGNOSIS — E876 Hypokalemia: Secondary | ICD-10-CM | POA: Diagnosis not present

## 2023-03-18 DIAGNOSIS — I509 Heart failure, unspecified: Secondary | ICD-10-CM | POA: Diagnosis not present

## 2023-03-18 DIAGNOSIS — E039 Hypothyroidism, unspecified: Secondary | ICD-10-CM | POA: Diagnosis not present

## 2023-03-18 DIAGNOSIS — G20C Parkinsonism, unspecified: Secondary | ICD-10-CM | POA: Insufficient documentation

## 2023-03-18 DIAGNOSIS — E119 Type 2 diabetes mellitus without complications: Secondary | ICD-10-CM | POA: Diagnosis not present

## 2023-03-18 LAB — CBC WITH DIFFERENTIAL/PLATELET
Abs Immature Granulocytes: 0.05 10*3/uL (ref 0.00–0.07)
Basophils Absolute: 0.1 10*3/uL (ref 0.0–0.1)
Basophils Relative: 1 %
Eosinophils Absolute: 0.4 10*3/uL (ref 0.0–0.5)
Eosinophils Relative: 3 %
HCT: 31.8 % — ABNORMAL LOW (ref 36.0–46.0)
Hemoglobin: 10.1 g/dL — ABNORMAL LOW (ref 12.0–15.0)
Immature Granulocytes: 1 %
Lymphocytes Relative: 6 %
Lymphs Abs: 0.6 10*3/uL — ABNORMAL LOW (ref 0.7–4.0)
MCH: 31 pg (ref 26.0–34.0)
MCHC: 31.8 g/dL (ref 30.0–36.0)
MCV: 97.5 fL (ref 80.0–100.0)
Monocytes Absolute: 0.7 10*3/uL (ref 0.1–1.0)
Monocytes Relative: 6 %
Neutro Abs: 8.9 10*3/uL — ABNORMAL HIGH (ref 1.7–7.7)
Neutrophils Relative %: 83 %
Platelets: 249 10*3/uL (ref 150–400)
RBC: 3.26 MIL/uL — ABNORMAL LOW (ref 3.87–5.11)
RDW: 14.4 % (ref 11.5–15.5)
WBC: 10.7 10*3/uL — ABNORMAL HIGH (ref 4.0–10.5)
nRBC: 0 % (ref 0.0–0.2)

## 2023-03-18 LAB — COMPREHENSIVE METABOLIC PANEL
ALT: <5 U/L (ref 0–44)
AST: 19 U/L (ref 15–41)
Albumin: 2.7 g/dL — ABNORMAL LOW (ref 3.5–5.0)
Alkaline Phosphatase: 86 U/L (ref 38–126)
Anion gap: 13 (ref 5–15)
BUN: 49 mg/dL — ABNORMAL HIGH (ref 8–23)
CO2: 28 mmol/L (ref 22–32)
Calcium: 8.5 mg/dL — ABNORMAL LOW (ref 8.9–10.3)
Chloride: 100 mmol/L (ref 98–111)
Creatinine, Ser: 1.28 mg/dL — ABNORMAL HIGH (ref 0.44–1.00)
GFR, Estimated: 43 mL/min — ABNORMAL LOW (ref >60)
Glucose, Bld: 83 mg/dL (ref 70–99)
Potassium: 2.8 mmol/L — ABNORMAL LOW (ref 3.5–5.1)
Sodium: 141 mmol/L (ref 135–145)
Total Bilirubin: 0.9 mg/dL (ref 0.3–1.2)
Total Protein: 6.9 g/dL (ref 6.5–8.1)

## 2023-03-18 MED ORDER — ALLOPURINOL 100 MG PO TABS
100.0000 mg | ORAL_TABLET | Freq: Every day | ORAL | Status: DC
  Administered 2023-03-19: 100 mg via ORAL
  Filled 2023-03-18: qty 1

## 2023-03-18 MED ORDER — QUETIAPINE FUMARATE 25 MG PO TABS
100.0000 mg | ORAL_TABLET | Freq: Every day | ORAL | Status: DC
  Administered 2023-03-19: 100 mg via ORAL
  Filled 2023-03-18: qty 4

## 2023-03-18 MED ORDER — AMANTADINE HCL 100 MG PO CAPS
100.0000 mg | ORAL_CAPSULE | Freq: Two times a day (BID) | ORAL | Status: DC
  Administered 2023-03-19 (×2): 100 mg via ORAL
  Filled 2023-03-18 (×3): qty 1

## 2023-03-18 MED ORDER — CARBIDOPA-LEVODOPA 25-100 MG PO TABS
2.0000 | ORAL_TABLET | Freq: Three times a day (TID) | ORAL | Status: DC
  Administered 2023-03-19 (×3): 2 via ORAL
  Filled 2023-03-18 (×4): qty 2

## 2023-03-18 MED ORDER — PRAVASTATIN SODIUM 20 MG PO TABS: 40.0000 mg | ORAL_TABLET | Freq: Every day | ORAL | Status: DC

## 2023-03-18 MED ORDER — PRAMIPEXOLE DIHYDROCHLORIDE 0.25 MG PO TABS
0.1250 mg | ORAL_TABLET | Freq: Four times a day (QID) | ORAL | Status: DC
  Administered 2023-03-19 (×2): 0.125 mg via ORAL
  Filled 2023-03-18 (×4): qty 0.5

## 2023-03-18 MED ORDER — SODIUM CHLORIDE 0.9 % IV SOLN
2.0000 g | INTRAVENOUS | Status: DC
  Administered 2023-03-19: 2 g via INTRAVENOUS
  Filled 2023-03-18: qty 20

## 2023-03-18 MED ORDER — CARBIDOPA-LEVODOPA ER 50-200 MG PO TBCR
1.0000 | EXTENDED_RELEASE_TABLET | Freq: Every day | ORAL | Status: DC
  Administered 2023-03-19: 1 via ORAL
  Filled 2023-03-18: qty 1

## 2023-03-18 MED ORDER — OXYCODONE-ACETAMINOPHEN 5-325 MG PO TABS: 1.0000 | ORAL_TABLET | Freq: Three times a day (TID) | ORAL | Status: DC | PRN

## 2023-03-18 MED ORDER — POTASSIUM CHLORIDE 20 MEQ PO PACK: 20.0000 meq | PACK | Freq: Once | ORAL | Status: DC

## 2023-03-18 MED ORDER — LEVOTHYROXINE SODIUM 50 MCG PO TABS: 75.0000 ug | ORAL_TABLET | Freq: Every day | ORAL | Status: DC

## 2023-03-18 MED ORDER — BUSPIRONE HCL 5 MG PO TABS
10.0000 mg | ORAL_TABLET | Freq: Two times a day (BID) | ORAL | Status: DC
  Administered 2023-03-19 (×2): 10 mg via ORAL
  Filled 2023-03-18 (×2): qty 2

## 2023-03-18 MED ORDER — TIZANIDINE HCL 2 MG PO TABS
4.0000 mg | ORAL_TABLET | Freq: Three times a day (TID) | ORAL | Status: DC
  Administered 2023-03-19 (×2): 4 mg via ORAL
  Filled 2023-03-18 (×2): qty 2

## 2023-03-18 MED ORDER — LOSARTAN POTASSIUM 50 MG PO TABS
100.0000 mg | ORAL_TABLET | Freq: Every day | ORAL | Status: DC
  Administered 2023-03-19: 100 mg via ORAL
  Filled 2023-03-18: qty 2

## 2023-03-18 MED ORDER — PANTOPRAZOLE SODIUM 40 MG PO TBEC
40.0000 mg | DELAYED_RELEASE_TABLET | Freq: Every day | ORAL | Status: DC
  Administered 2023-03-19: 40 mg via ORAL
  Filled 2023-03-18: qty 1

## 2023-03-18 MED ORDER — GABAPENTIN 300 MG PO CAPS
300.0000 mg | ORAL_CAPSULE | Freq: Every day | ORAL | Status: DC
  Administered 2023-03-19: 300 mg via ORAL
  Filled 2023-03-18: qty 1

## 2023-03-18 MED ORDER — ADULT MULTIVITAMIN W/MINERALS CH
1.0000 | ORAL_TABLET | Freq: Every day | ORAL | Status: DC
  Administered 2023-03-19: 1 via ORAL
  Filled 2023-03-18: qty 1

## 2023-03-18 MED ORDER — TORSEMIDE 20 MG PO TABS
20.0000 mg | ORAL_TABLET | Freq: Every day | ORAL | Status: DC
  Administered 2023-03-19: 20 mg via ORAL
  Filled 2023-03-18: qty 1

## 2023-03-18 MED ORDER — ASPIRIN 81 MG PO TBEC
81.0000 mg | DELAYED_RELEASE_TABLET | Freq: Every day | ORAL | Status: DC
  Administered 2023-03-19: 81 mg via ORAL
  Filled 2023-03-18: qty 1

## 2023-03-18 MED ORDER — POLYETHYLENE GLYCOL 3350 17 G PO PACK
17.0000 g | PACK | Freq: Every day | ORAL | Status: DC
  Filled 2023-03-18: qty 1

## 2023-03-18 MED ORDER — DOCUSATE SODIUM 100 MG PO CAPS
200.0000 mg | ORAL_CAPSULE | Freq: Two times a day (BID) | ORAL | Status: DC
  Administered 2023-03-19 (×2): 200 mg via ORAL
  Filled 2023-03-18 (×2): qty 2

## 2023-03-18 MED ORDER — POTASSIUM CHLORIDE CRYS ER 10 MEQ PO TBCR
10.0000 meq | EXTENDED_RELEASE_TABLET | Freq: Every day | ORAL | Status: DC
  Administered 2023-03-19: 10 meq via ORAL
  Filled 2023-03-18: qty 1

## 2023-03-18 MED ORDER — METFORMIN HCL 500 MG PO TABS
1000.0000 mg | ORAL_TABLET | Freq: Two times a day (BID) | ORAL | Status: DC
  Administered 2023-03-19: 1000 mg via ORAL
  Filled 2023-03-18: qty 2

## 2023-03-18 MED ORDER — POTASSIUM CHLORIDE 10 MEQ/100ML IV SOLN
10.0000 meq | Freq: Once | INTRAVENOUS | Status: AC
  Administered 2023-03-18: 10 meq via INTRAVENOUS
  Filled 2023-03-18: qty 100

## 2023-03-18 MED ORDER — ACETAMINOPHEN 500 MG PO TABS: 1000.0000 mg | ORAL_TABLET | Freq: Three times a day (TID) | ORAL | Status: DC | PRN

## 2023-03-18 MED ORDER — FLUTICASONE PROPIONATE 50 MCG/ACT NA SUSP
1.0000 | Freq: Every day | NASAL | Status: DC
  Administered 2023-03-19: 1 via NASAL
  Filled 2023-03-18 (×2): qty 16

## 2023-03-18 MED ORDER — ENTACAPONE 200 MG PO TABS
100.0000 mg | ORAL_TABLET | Freq: Three times a day (TID) | ORAL | Status: DC
  Administered 2023-03-19 (×2): 100 mg via ORAL
  Filled 2023-03-18 (×3): qty 0.5

## 2023-03-18 NOTE — ED Notes (Signed)
Patient resting comfortably in bed.  Family is feeding her Cheetos and  Gingerale.

## 2023-03-18 NOTE — ED Triage Notes (Signed)
Pt arrived via EMS from [facility] for a wound check. Per EMS family is requesting more aggressive wound care for her stage 4 sacral wound. EMS states pt is already receiving antibiotics and having wound care done. Pt arrived with wound dressed and appears clean. Pt also has a PICC line to right upper arm. Pt is A&Ox1. Pt placed comfortably in bed side lying left with pillows to keep off sacrum. Pt has pillow in between knees as well.

## 2023-03-18 NOTE — ED Notes (Signed)
Pt family at bedside. Pt repositioned. Pt appears to be resting comfortably at this time. Pt family denies any additional need or concerns.

## 2023-03-18 NOTE — Discharge Instructions (Addendum)
Please begin taking your potassium twice a day Please roll patient every 1-2 hours while patient is awake

## 2023-03-18 NOTE — ED Provider Notes (Signed)
[facility] Provider Note   Event Date/Time   First MD Initiated Contact with Patient 03/18/23 1051     (approximate) History  Wound Check  HPI Stephanie [name] is a 77 y.o. female with a past history of CHF, hypothyroidism, and type 2 diabetes as well as Parkinson's disease who presents from her long-term care facility [facility]) via EMS for a wound check.  Patient is unable to provide an accurate history and therefore I spoke to patient's daughter over the phone, [name].  Patient's daughter stated that she was called by facility staff today and told that this patient's ulcer was likely "terminal" despite receiving IV antibiotics and recommended "keeping patient comfortable".  Patient's daughter states that she wanted the emergency department to evaluate her to see if her symptoms were worsening or anything else that could be done about this ulcer ROS: Unable to assess   Physical Exam  Triage Vital Signs: ED Triage Vitals  Enc Vitals Group     BP 03/18/23 1105 131/65     Pulse Rate 03/18/23 1105 87     Resp 03/18/23 1105 17     Temp 03/18/23 1105 97.8 F (36.6 C)     Temp Source 03/18/23 1105 Axillary     SpO2 03/18/23 1105 97 %     Weight 03/18/23 1110 135 lb 2.3 oz (61.3 kg)     Height 03/18/23 1110 5' (1.524 m)     Head Circumference --      [facility] Flow --      Pain Score 03/18/23 1110 0     Pain Loc --      Pain Edu? --      Excl. in GC? --    Most recent vital signs: Vitals:   03/18/23 1400 03/18/23 1500  BP: (!) 154/82 (!) 146/75  Pulse: 83 72  Resp: 16 16  Temp: 97.9 F (36.6 C)   SpO2: 97% 100%   General: Awake, cooperative CV:  Good peripheral perfusion.  Resp:  Normal effort.  Abd:  No distention.  Other:  Sacral ulcer with wet-to-dry dressing in place and without any active purulent drainage or surrounding erythema/induration ED Results / Procedures / Treatments  Labs (all labs ordered are listed, but only abnormal results  are displayed) Labs Reviewed  CBC WITH DIFFERENTIAL/PLATELET - Abnormal; Notable for the following components:      Result Value   WBC 10.7 (*)    RBC 3.26 (*)    Hemoglobin 10.1 (*)    HCT 31.8 (*)    Neutro Abs 8.9 (*)    Lymphs Abs 0.6 (*)    All other components within normal limits  COMPREHENSIVE METABOLIC PANEL - Abnormal; Notable for the following components:   Potassium 2.8 (*)    BUN 49 (*)    Creatinine, Ser 1.28 (*)    Calcium 8.5 (*)    Albumin 2.7 (*)    GFR, Estimated 43 (*)    All other components within normal limits    PROCEDURES: Critical Care performed: No Procedures MEDICATIONS ORDERED IN ED: Medications  potassium chloride 10 mEq in 100 mL IVPB (0 mEq Intravenous Stopped 03/18/23 1410)   IMPRESSION / MDM / ASSESSMENT AND PLAN / ED COURSE  I reviewed the triage vital signs and the nursing notes.                             The patient is  on the cardiac monitor to evaluate for evidence of arrhythmia and/or significant heart rate changes. Patient's presentation is most consistent with acute presentation with potential threat to life or bodily function. Patient was seen and evaluated today for her stage IV sacral ulcer for which she is currently receiving IV antibiotics.  Patient's wound is clean without surrounding erythema or purulent drainage with a wet-to-dry dressing in place.  Patient is afebrile, nontachycardic, and mildly elevated white blood cell count of 10.7.  I spoke to patient's family, [name], who had multiple concerns about patient's care at her skilled nursing facility including patient's bed, wound care, and recently being told that patient is "terminal".  In further discussion, they are concerned that patient is unable to be positioned correctly given that she constantly moves due to her Parkinson's and yet does not have a bed with bed rails so that they can place wedge pillows and things like that without the moving.  Discussions with on-call  nursing staff at the skilled nursing facility, she stated that beds at the facility are unable to have bed rails due to a state guideline as bed rails are seen as "a restraint".  Social work consult order has been placed for further evaluation and collaboration with family for these concerns.  Care of this patient will be signed out to the oncoming physician at the end of my shift.  All pertinent patient information conveyed and all questions answered.  All further care and disposition decisions will be made by the oncoming physician.  Dispo: Pending   FINAL CLINICAL IMPRESSION(S) / ED DIAGNOSES   Final diagnoses:  Visit for wound check  Hypokalemia   Rx / DC Orders   ED Discharge Orders     None      Note:  This document was prepared using Dragon voice recognition software and may include unintentional dictation errors.   Merwyn Katos, MD 03/18/23 [phone]

## 2023-03-18 NOTE — ED Notes (Signed)
Pt resting comfortably. Pt family at bedside. Pt and family denies any needs or concerns at this time. Waiting on [facility] consult.

## 2023-03-18 NOTE — ED Notes (Signed)
RN rounded on pt. Pt turned to right side with pillows In between legs and behind back. Pt states she is comfortable. Pt brief and chuck pad dry.

## 2023-03-18 NOTE — ED Provider Notes (Addendum)
Plan was for patient's family to speak with social work and hopes to getting her back to [facility].  I talked with patient's daughter who is concerned that at the facility she is currently at the do not have bed rails the patient cannot be turned on her side.  She is concerned that her sacral wound will never heal as a result of this.  Dr. Vicente Males did reach out to [facility] and was told that due to a certain law they are not allowed to have beds with rails as it seen as a restraint.  Patient's daughter is not willing to have her go back to [facility] at this time if there is not a plan in place for her to be turned onto her side.   Given there is no social work availability at this hour patient will need to be put in boarding status.  I have asked pharmacy to do a med rec's-he is currently getting IV antibiotics through PICC line will need to continue this.  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  Unclear if she got that dose today.  Will have pharmacy reach out to [facility] for full med rec.   Georga Hacking, MD 03/18/23 1943    Georga Hacking, MD 03/18/23 [phone]

## 2023-03-19 DIAGNOSIS — Z5189 Encounter for other specified aftercare: Secondary | ICD-10-CM | POA: Diagnosis not present

## 2023-03-19 MED ORDER — PRAVASTATIN SODIUM 20 MG PO TABS: 40.0000 mg | ORAL_TABLET | Freq: Every day | ORAL | Status: DC

## 2023-03-19 MED ORDER — SODIUM CHLORIDE 0.9 % IV SOLN: 2.0000 g | INTRAVENOUS | Status: DC

## 2023-03-19 MED ORDER — LEVOTHYROXINE SODIUM 50 MCG PO TABS
75.0000 ug | ORAL_TABLET | Freq: Every day | ORAL | Status: DC
  Administered 2023-03-19: 75 ug via ORAL
  Filled 2023-03-19: qty 2

## 2023-03-19 NOTE — ED Provider Notes (Signed)
77 year old female who presented from her long-term care facility for a wound check.  She was noted to have a stage IV sacral ulcer for which she is currently receiving IV antibiotics.  There was some concerns about her care at her facility, so social work was consulted.  I was notified by social work that patient is  ready for discharge back to her facility Environmental consultant). Patient discharged in stable condition.    Trinna Post, MD 03/19/23 1102

## 2023-03-19 NOTE — ED Notes (Signed)
Patient bed cleaned with new bed liens placed. Patient repositioned in bed. Lunch tray placed at bedside with patient eating.

## 2023-03-19 NOTE — Progress Notes (Addendum)
CSW spoke with patients daughter Noralee Stain, [phone]. Patients daughter was upset that the facility was not keeping patient on her side so the wound could heal. Patients daughter was also not aware that skilled nursing facilities do not have bed rails. Ms. Pernell Dupre stated they cannot care for patient at home and patient would need to discharge back to [facility]. CSW encouraged patients daughter to speak with the director on Monday about her concerns at [facility]. Information was given to patients RN to complete report.

## 2023-03-19 NOTE — ED Notes (Signed)
Patient resting comfortably at this time. Respirations even and unlabored. NAD noted.

## 2023-05-02 DEATH — deceased
